# Patient Record
Sex: Female | Born: 1950 | Race: White | Hispanic: No | Marital: Married | Smoking: Never smoker
Health system: Southern US, Community
[De-identification: ages and names within clinical notes are randomized; demographics above are authoritative.]

## PROBLEM LIST (undated history)

## (undated) DIAGNOSIS — M545 Low back pain, unspecified: Secondary | ICD-10-CM

## (undated) DIAGNOSIS — K219 Gastro-esophageal reflux disease without esophagitis: Secondary | ICD-10-CM

## (undated) DIAGNOSIS — R112 Nausea with vomiting, unspecified: Secondary | ICD-10-CM

## (undated) DIAGNOSIS — N952 Postmenopausal atrophic vaginitis: Secondary | ICD-10-CM

## (undated) DIAGNOSIS — K648 Other hemorrhoids: Secondary | ICD-10-CM

## (undated) DIAGNOSIS — M169 Osteoarthritis of hip, unspecified: Secondary | ICD-10-CM

## (undated) DIAGNOSIS — J189 Pneumonia, unspecified organism: Secondary | ICD-10-CM

## (undated) DIAGNOSIS — E669 Obesity, unspecified: Secondary | ICD-10-CM

## (undated) DIAGNOSIS — N390 Urinary tract infection, site not specified: Secondary | ICD-10-CM

## (undated) DIAGNOSIS — M5090 Cervical disc disorder, unspecified, unspecified cervical region: Secondary | ICD-10-CM

## (undated) DIAGNOSIS — R32 Unspecified urinary incontinence: Secondary | ICD-10-CM

## (undated) DIAGNOSIS — R87629 Unspecified abnormal cytological findings in specimens from vagina: Principal | ICD-10-CM

## (undated) DIAGNOSIS — I341 Nonrheumatic mitral (valve) prolapse: Secondary | ICD-10-CM

## (undated) DIAGNOSIS — M1990 Unspecified osteoarthritis, unspecified site: Secondary | ICD-10-CM

## (undated) DIAGNOSIS — G8929 Other chronic pain: Secondary | ICD-10-CM

## (undated) DIAGNOSIS — N3281 Overactive bladder: Secondary | ICD-10-CM

## (undated) DIAGNOSIS — Z1371 Encounter for nonprocreative screening for genetic disease carrier status: Secondary | ICD-10-CM

## (undated) DIAGNOSIS — E785 Hyperlipidemia, unspecified: Principal | ICD-10-CM

## (undated) DIAGNOSIS — C539 Malignant neoplasm of cervix uteri, unspecified: Secondary | ICD-10-CM

## (undated) DIAGNOSIS — G4730 Sleep apnea, unspecified: Secondary | ICD-10-CM

## (undated) DIAGNOSIS — K449 Diaphragmatic hernia without obstruction or gangrene: Secondary | ICD-10-CM

## (undated) DIAGNOSIS — IMO0002 Reserved for concepts with insufficient information to code with codable children: Secondary | ICD-10-CM

## (undated) DIAGNOSIS — K649 Unspecified hemorrhoids: Secondary | ICD-10-CM

## (undated) DIAGNOSIS — Z9889 Other specified postprocedural states: Secondary | ICD-10-CM

## (undated) DIAGNOSIS — N393 Stress incontinence (female) (male): Secondary | ICD-10-CM

## (undated) DIAGNOSIS — K5790 Diverticulosis of intestine, part unspecified, without perforation or abscess without bleeding: Secondary | ICD-10-CM

## (undated) DIAGNOSIS — N318 Other neuromuscular dysfunction of bladder: Secondary | ICD-10-CM

## (undated) DIAGNOSIS — J309 Allergic rhinitis, unspecified: Secondary | ICD-10-CM

## (undated) HISTORY — DX: Osteoarthritis of hip, unspecified: M16.9

## (undated) HISTORY — DX: Cervical disc disorder, unspecified, unspecified cervical region: M50.90

## (undated) HISTORY — DX: Low back pain: M54.5

## (undated) HISTORY — DX: Diaphragmatic hernia without obstruction or gangrene: K44.9

## (undated) HISTORY — DX: Reserved for concepts with insufficient information to code with codable children: IMO0002

## (undated) HISTORY — PX: EYE SURGERY: SHX253

## (undated) HISTORY — PX: CLOSED REDUCTION METATARSAL FRACTURE: SUR228

## (undated) HISTORY — DX: Diverticulosis of intestine, part unspecified, without perforation or abscess without bleeding: K57.90

## (undated) HISTORY — DX: Allergic rhinitis, unspecified: J30.9

## (undated) HISTORY — DX: Hyperlipidemia, unspecified: E78.5

## (undated) HISTORY — DX: Stress incontinence (female) (male): N39.3

## (undated) HISTORY — DX: Gastro-esophageal reflux disease without esophagitis: K21.9

## (undated) HISTORY — DX: Other chronic pain: G89.29

## (undated) HISTORY — DX: Encounter for nonprocreative screening for genetic disease carrier status: Z13.71

## (undated) HISTORY — PX: TONSILLECTOMY: SUR1361

## (undated) HISTORY — DX: Malignant neoplasm of cervix uteri, unspecified: C53.9

## (undated) HISTORY — PX: COLONOSCOPY: SHX174

## (undated) HISTORY — DX: Unspecified osteoarthritis, unspecified site: M19.90

## (undated) HISTORY — DX: Other neuromuscular dysfunction of bladder: N31.8

## (undated) HISTORY — DX: Unspecified abnormal cytological findings in specimens from vagina: R87.629

## (undated) HISTORY — DX: Urinary tract infection, site not specified: N39.0

## (undated) HISTORY — DX: Postmenopausal atrophic vaginitis: N95.2

## (undated) HISTORY — PX: OTHER SURGICAL HISTORY: SHX169

## (undated) HISTORY — DX: Obesity, unspecified: E66.9

## (undated) HISTORY — DX: Overactive bladder: N32.81

## (undated) HISTORY — DX: Other hemorrhoids: K64.8

---

## 1980-11-07 HISTORY — PX: OTHER SURGICAL HISTORY: SHX169

## 1982-11-07 DIAGNOSIS — C539 Malignant neoplasm of cervix uteri, unspecified: Secondary | ICD-10-CM

## 1982-11-07 HISTORY — DX: Malignant neoplasm of cervix uteri, unspecified: C53.9

## 1984-11-07 HISTORY — PX: VAGINAL HYSTERECTOMY: SUR661

## 2000-04-21 ENCOUNTER — Other Ambulatory Visit: Admission: RE | Admit: 2000-04-21 | Discharge: 2000-04-21 | Payer: Self-pay | Admitting: *Deleted

## 2000-09-16 ENCOUNTER — Emergency Department (HOSPITAL_COMMUNITY): Admission: EM | Admit: 2000-09-16 | Discharge: 2000-09-16 | Payer: Self-pay | Admitting: Emergency Medicine

## 2000-11-07 HISTORY — PX: CERVICAL DISCECTOMY: SHX98

## 2000-11-28 ENCOUNTER — Observation Stay (HOSPITAL_COMMUNITY): Admission: RE | Admit: 2000-11-28 | Discharge: 2000-11-29 | Payer: Self-pay | Admitting: Urology

## 2001-02-13 ENCOUNTER — Ambulatory Visit (HOSPITAL_COMMUNITY): Admission: RE | Admit: 2001-02-13 | Discharge: 2001-02-13 | Payer: Self-pay | Admitting: Urology

## 2001-03-28 ENCOUNTER — Other Ambulatory Visit: Admission: RE | Admit: 2001-03-28 | Discharge: 2001-03-28 | Payer: Self-pay | Admitting: *Deleted

## 2001-11-07 HISTORY — PX: CYSTOCELE REPAIR: SHX163

## 2002-05-30 ENCOUNTER — Encounter: Admission: RE | Admit: 2002-05-30 | Discharge: 2002-05-30 | Payer: Self-pay | Admitting: *Deleted

## 2002-07-05 ENCOUNTER — Inpatient Hospital Stay (HOSPITAL_COMMUNITY): Admission: RE | Admit: 2002-07-05 | Discharge: 2002-07-06 | Payer: Self-pay | Admitting: Neurosurgery

## 2002-07-05 ENCOUNTER — Encounter: Payer: Self-pay | Admitting: Neurosurgery

## 2002-09-19 ENCOUNTER — Encounter: Admission: RE | Admit: 2002-09-19 | Discharge: 2002-09-19 | Payer: Self-pay | Admitting: Neurosurgery

## 2002-09-19 ENCOUNTER — Encounter: Payer: Self-pay | Admitting: Neurosurgery

## 2002-10-01 ENCOUNTER — Encounter: Admission: RE | Admit: 2002-10-01 | Discharge: 2002-10-01 | Payer: Self-pay | Admitting: *Deleted

## 2002-10-01 ENCOUNTER — Encounter: Payer: Self-pay | Admitting: Neurosurgery

## 2002-10-15 ENCOUNTER — Encounter: Payer: Self-pay | Admitting: Neurosurgery

## 2002-10-15 ENCOUNTER — Encounter: Admission: RE | Admit: 2002-10-15 | Discharge: 2002-10-15 | Payer: Self-pay | Admitting: Neurosurgery

## 2002-11-07 HISTORY — PX: SHOULDER ARTHROSCOPY: SHX128

## 2003-02-28 ENCOUNTER — Ambulatory Visit (HOSPITAL_BASED_OUTPATIENT_CLINIC_OR_DEPARTMENT_OTHER): Admission: RE | Admit: 2003-02-28 | Discharge: 2003-02-28 | Payer: Self-pay | Admitting: Urology

## 2003-09-09 ENCOUNTER — Other Ambulatory Visit: Admission: RE | Admit: 2003-09-09 | Discharge: 2003-09-09 | Payer: Self-pay | Admitting: Internal Medicine

## 2003-11-14 ENCOUNTER — Encounter: Payer: Self-pay | Admitting: Gastroenterology

## 2004-03-23 ENCOUNTER — Ambulatory Visit (HOSPITAL_BASED_OUTPATIENT_CLINIC_OR_DEPARTMENT_OTHER): Admission: RE | Admit: 2004-03-23 | Discharge: 2004-03-23 | Payer: Self-pay | Admitting: Orthopedic Surgery

## 2004-09-23 ENCOUNTER — Ambulatory Visit: Payer: Self-pay | Admitting: Family Medicine

## 2004-10-05 ENCOUNTER — Ambulatory Visit: Payer: Self-pay | Admitting: Family Medicine

## 2004-11-03 ENCOUNTER — Ambulatory Visit: Payer: Self-pay | Admitting: Family Medicine

## 2005-01-25 ENCOUNTER — Ambulatory Visit: Payer: Self-pay | Admitting: Family Medicine

## 2005-02-15 ENCOUNTER — Encounter: Admission: RE | Admit: 2005-02-15 | Discharge: 2005-02-15 | Payer: Self-pay | Admitting: Neurosurgery

## 2005-03-07 ENCOUNTER — Encounter: Admission: RE | Admit: 2005-03-07 | Discharge: 2005-03-07 | Payer: Self-pay | Admitting: Neurosurgery

## 2005-05-11 ENCOUNTER — Other Ambulatory Visit: Admission: RE | Admit: 2005-05-11 | Discharge: 2005-05-11 | Payer: Self-pay | Admitting: Gynecology

## 2005-05-19 ENCOUNTER — Ambulatory Visit: Payer: Self-pay | Admitting: Family Medicine

## 2005-06-01 ENCOUNTER — Ambulatory Visit (HOSPITAL_COMMUNITY): Admission: RE | Admit: 2005-06-01 | Discharge: 2005-06-02 | Payer: Self-pay | Admitting: Gynecology

## 2005-08-30 ENCOUNTER — Emergency Department (HOSPITAL_COMMUNITY): Admission: EM | Admit: 2005-08-30 | Discharge: 2005-08-30 | Payer: Self-pay | Admitting: Emergency Medicine

## 2005-11-07 HISTORY — PX: BUNIONECTOMY: SHX129

## 2005-12-06 ENCOUNTER — Ambulatory Visit: Payer: Self-pay | Admitting: Family Medicine

## 2005-12-14 ENCOUNTER — Ambulatory Visit: Payer: Self-pay | Admitting: Family Medicine

## 2005-12-22 ENCOUNTER — Ambulatory Visit: Payer: Self-pay | Admitting: Family Medicine

## 2006-03-24 ENCOUNTER — Ambulatory Visit: Payer: Self-pay | Admitting: Family Medicine

## 2006-05-07 ENCOUNTER — Encounter: Payer: Self-pay | Admitting: Family Medicine

## 2006-05-07 LAB — CONVERTED CEMR LAB: Pap Smear: NORMAL

## 2006-05-15 ENCOUNTER — Other Ambulatory Visit: Admission: RE | Admit: 2006-05-15 | Discharge: 2006-05-15 | Payer: Self-pay | Admitting: Gynecology

## 2007-01-03 ENCOUNTER — Ambulatory Visit: Payer: Self-pay | Admitting: Family Medicine

## 2007-01-03 LAB — CONVERTED CEMR LAB
Alkaline Phosphatase: 78 units/L (ref 39–117)
Basophils Relative: 0.1 % (ref 0.0–1.0)
Bilirubin, Direct: 0.1 mg/dL (ref 0.0–0.3)
Chloride: 108 meq/L (ref 96–112)
Eosinophils Absolute: 0.3 10*3/uL (ref 0.0–0.6)
GFR calc non Af Amer: 80 mL/min
Glucose, Bld: 88 mg/dL (ref 70–99)
HDL: 50.5 mg/dL (ref >39.0)
Hemoglobin: 11.9 g/dL — ABNORMAL LOW (ref 12.0–15.0)
LDL Cholesterol: 119 mg/dL — ABNORMAL HIGH (ref 0–99)
Lymphocytes Relative: 41.5 % (ref 12.0–46.0)
Neutro Abs: 3.3 10*3/uL (ref 1.4–7.7)
Neutrophils Relative %: 47.6 % (ref 43.0–77.0)
Potassium: 3.9 meq/L (ref 3.5–5.1)
RBC: 3.99 M/uL (ref 3.87–5.11)
Sodium: 143 meq/L (ref 135–145)
TSH: 2.1 microintl units/mL (ref 0.35–5.50)
Total CHOL/HDL Ratio: 3.7
Triglycerides: 90 mg/dL (ref 0–149)
WBC: 6.8 10*3/uL (ref 4.5–10.5)

## 2007-01-10 ENCOUNTER — Ambulatory Visit: Payer: Self-pay | Admitting: Family Medicine

## 2007-01-25 ENCOUNTER — Ambulatory Visit: Payer: Self-pay

## 2007-04-04 ENCOUNTER — Encounter: Payer: Self-pay | Admitting: Family Medicine

## 2007-05-03 ENCOUNTER — Ambulatory Visit: Payer: Self-pay | Admitting: Family Medicine

## 2007-05-23 ENCOUNTER — Other Ambulatory Visit: Admission: RE | Admit: 2007-05-23 | Discharge: 2007-05-23 | Payer: Self-pay | Admitting: Gynecology

## 2007-08-10 ENCOUNTER — Ambulatory Visit: Payer: Self-pay | Admitting: Family Medicine

## 2007-08-10 DIAGNOSIS — K219 Gastro-esophageal reflux disease without esophagitis: Secondary | ICD-10-CM

## 2007-08-10 DIAGNOSIS — N318 Other neuromuscular dysfunction of bladder: Secondary | ICD-10-CM

## 2007-08-10 HISTORY — DX: Gastro-esophageal reflux disease without esophagitis: K21.9

## 2007-08-10 HISTORY — DX: Other neuromuscular dysfunction of bladder: N31.8

## 2007-10-03 ENCOUNTER — Ambulatory Visit: Payer: Self-pay | Admitting: Family Medicine

## 2007-10-08 ENCOUNTER — Ambulatory Visit: Payer: Self-pay | Admitting: Family Medicine

## 2008-01-24 ENCOUNTER — Encounter: Payer: Self-pay | Admitting: Family Medicine

## 2008-01-30 ENCOUNTER — Ambulatory Visit: Payer: Self-pay | Admitting: Family Medicine

## 2008-01-30 LAB — CONVERTED CEMR LAB
Bilirubin Urine: NEGATIVE
Ketones, urine, test strip: NEGATIVE
Nitrite: POSITIVE
Protein, U semiquant: NEGATIVE
Specific Gravity, Urine: 1.02

## 2008-02-05 ENCOUNTER — Encounter: Payer: Self-pay | Admitting: Family Medicine

## 2008-02-06 ENCOUNTER — Ambulatory Visit: Payer: Self-pay | Admitting: Family Medicine

## 2008-02-06 LAB — CONVERTED CEMR LAB
Albumin: 3.8 g/dL (ref 3.5–5.2)
Basophils Relative: 0.9 % (ref 0.0–1.0)
Cholesterol: 189 mg/dL (ref 0–200)
Eosinophils Absolute: 0.2 10*3/uL (ref 0.0–0.6)
Eosinophils Relative: 2.4 % (ref 0.0–5.0)
GFR calc non Af Amer: 79 mL/min
HCT: 40.5 % (ref 36.0–46.0)
Hemoglobin: 13.1 g/dL (ref 12.0–15.0)
Lymphocytes Relative: 45.3 % (ref 12.0–46.0)
MCHC: 32.5 g/dL (ref 30.0–36.0)
MCV: 88.9 fL (ref 78.0–100.0)
Monocytes Absolute: 0.4 10*3/uL (ref 0.2–0.7)
Neutro Abs: 2.9 10*3/uL (ref 1.4–7.7)
Platelets: 234 10*3/uL (ref 150–400)
TSH: 2.28 microintl units/mL (ref 0.35–5.50)
Total Protein: 6.6 g/dL (ref 6.0–8.3)

## 2008-03-17 ENCOUNTER — Encounter: Payer: Self-pay | Admitting: Family Medicine

## 2008-03-21 ENCOUNTER — Ambulatory Visit: Payer: Self-pay | Admitting: Family Medicine

## 2008-04-11 ENCOUNTER — Encounter: Payer: Self-pay | Admitting: Family Medicine

## 2008-04-28 ENCOUNTER — Encounter: Payer: Self-pay | Admitting: Family Medicine

## 2008-07-29 ENCOUNTER — Ambulatory Visit: Payer: Self-pay | Admitting: Family Medicine

## 2008-07-29 DIAGNOSIS — E669 Obesity, unspecified: Secondary | ICD-10-CM | POA: Insufficient documentation

## 2008-07-31 ENCOUNTER — Ambulatory Visit: Payer: Self-pay | Admitting: Family Medicine

## 2008-08-07 ENCOUNTER — Encounter: Payer: Self-pay | Admitting: Family Medicine

## 2008-11-11 ENCOUNTER — Ambulatory Visit: Payer: Self-pay | Admitting: Family Medicine

## 2008-11-11 DIAGNOSIS — N952 Postmenopausal atrophic vaginitis: Secondary | ICD-10-CM

## 2008-11-11 HISTORY — DX: Postmenopausal atrophic vaginitis: N95.2

## 2008-11-11 LAB — CONVERTED CEMR LAB
Glucose, Urine, Semiquant: NEGATIVE
Nitrite: NEGATIVE
Specific Gravity, Urine: >=1.03
Urobilinogen, UA: 0.2

## 2009-01-14 ENCOUNTER — Encounter: Payer: Self-pay | Admitting: Family Medicine

## 2009-01-30 ENCOUNTER — Ambulatory Visit: Payer: Self-pay | Admitting: Family Medicine

## 2009-01-30 LAB — CONVERTED CEMR LAB
Bilirubin Urine: NEGATIVE
Glucose, Urine, Semiquant: NEGATIVE
Ketones, urine, test strip: NEGATIVE
Specific Gravity, Urine: 1.025
pH: 5.5

## 2009-02-05 LAB — HM MAMMOGRAPHY

## 2009-02-10 ENCOUNTER — Ambulatory Visit: Payer: Self-pay | Admitting: Family Medicine

## 2009-02-10 DIAGNOSIS — M1710 Unilateral primary osteoarthritis, unspecified knee: Secondary | ICD-10-CM

## 2009-02-10 DIAGNOSIS — IMO0002 Reserved for concepts with insufficient information to code with codable children: Secondary | ICD-10-CM

## 2009-02-10 HISTORY — DX: Unilateral primary osteoarthritis, unspecified knee: M17.10

## 2009-02-10 LAB — CONVERTED CEMR LAB
ALT: 17 units/L (ref 0–35)
Albumin: 3.5 g/dL (ref 3.5–5.2)
Alkaline Phosphatase: 75 units/L (ref 39–117)
Bilirubin, Direct: 0.1 mg/dL (ref 0.0–0.3)
Calcium: 9.3 mg/dL (ref 8.4–10.5)
Cholesterol: 175 mg/dL (ref 0–200)
Eosinophils Relative: 2.3 % (ref 0.0–5.0)
HDL: 39.8 mg/dL (ref >39.00)
Hgb A1c MFr Bld: 5.9 % (ref 4.6–6.5)
LDL Cholesterol: 116 mg/dL — ABNORMAL HIGH (ref 0–99)
MCHC: 33.8 g/dL (ref 30.0–36.0)
MCV: 87.5 fL (ref 78.0–100.0)
Monocytes Absolute: 0.4 10*3/uL (ref 0.1–1.0)
Neutrophils Relative %: 47.1 % (ref 43.0–77.0)
Potassium: 3.9 meq/L (ref 3.5–5.1)
RDW: 12.7 % (ref 11.5–14.6)
TSH: 1.36 microintl units/mL (ref 0.35–5.50)
Triglycerides: 95 mg/dL (ref 0.0–149.0)
WBC: 6.3 10*3/uL (ref 4.5–10.5)

## 2009-03-10 ENCOUNTER — Telehealth: Payer: Self-pay | Admitting: Family Medicine

## 2009-03-11 ENCOUNTER — Encounter: Payer: Self-pay | Admitting: Family Medicine

## 2009-03-13 ENCOUNTER — Telehealth: Payer: Self-pay | Admitting: Family Medicine

## 2009-04-13 ENCOUNTER — Encounter: Payer: Self-pay | Admitting: Family Medicine

## 2009-04-13 ENCOUNTER — Telehealth (INDEPENDENT_AMBULATORY_CARE_PROVIDER_SITE_OTHER): Payer: Self-pay | Admitting: *Deleted

## 2009-04-21 ENCOUNTER — Encounter: Payer: Self-pay | Admitting: Family Medicine

## 2009-04-28 ENCOUNTER — Ambulatory Visit: Payer: Self-pay | Admitting: Family Medicine

## 2009-05-29 ENCOUNTER — Encounter: Payer: Self-pay | Admitting: Family Medicine

## 2009-07-09 ENCOUNTER — Ambulatory Visit: Payer: Self-pay | Admitting: Family Medicine

## 2009-07-14 ENCOUNTER — Ambulatory Visit: Payer: Self-pay | Admitting: Family Medicine

## 2009-07-15 ENCOUNTER — Ambulatory Visit: Payer: Self-pay | Admitting: Family Medicine

## 2009-07-16 ENCOUNTER — Ambulatory Visit: Payer: Self-pay | Admitting: Family Medicine

## 2009-07-20 LAB — CONVERTED CEMR LAB
Basophils Relative: 0.2 % (ref 0.0–3.0)
Eosinophils Absolute: 0.3 10*3/uL (ref 0.0–0.7)
Eosinophils Relative: 5.7 % — ABNORMAL HIGH (ref 0.0–5.0)
Lymphocytes Relative: 36.8 % (ref 12.0–46.0)
MCHC: 34.6 g/dL (ref 30.0–36.0)
Neutro Abs: 2.8 10*3/uL (ref 1.4–7.7)
Neutrophils Relative %: 49.9 % (ref 43.0–77.0)
Platelets: 188 10*3/uL (ref 150.0–400.0)
WBC: 5.5 10*3/uL (ref 4.5–10.5)

## 2009-07-22 ENCOUNTER — Ambulatory Visit: Payer: Self-pay | Admitting: Family Medicine

## 2009-07-23 ENCOUNTER — Telehealth: Payer: Self-pay | Admitting: Family Medicine

## 2009-07-29 ENCOUNTER — Telehealth: Payer: Self-pay | Admitting: Family Medicine

## 2009-07-30 ENCOUNTER — Telehealth: Payer: Self-pay | Admitting: Family Medicine

## 2009-07-31 ENCOUNTER — Ambulatory Visit: Payer: Self-pay | Admitting: Family Medicine

## 2009-08-05 ENCOUNTER — Ambulatory Visit: Payer: Self-pay | Admitting: Cardiovascular Disease

## 2009-08-06 ENCOUNTER — Telehealth: Payer: Self-pay | Admitting: Family Medicine

## 2009-08-20 ENCOUNTER — Encounter: Payer: Self-pay | Admitting: Family Medicine

## 2009-08-28 ENCOUNTER — Ambulatory Visit: Payer: Self-pay | Admitting: Family Medicine

## 2009-09-07 ENCOUNTER — Encounter: Payer: Self-pay | Admitting: Family Medicine

## 2009-09-10 ENCOUNTER — Ambulatory Visit: Payer: Self-pay | Admitting: Family Medicine

## 2009-09-10 LAB — CONVERTED CEMR LAB
Blood in Urine, dipstick: NEGATIVE
Ketones, urine, test strip: NEGATIVE
Nitrite: NEGATIVE
Protein, U semiquant: NEGATIVE
Specific Gravity, Urine: 1.02
Urobilinogen, UA: 0.2

## 2009-12-02 ENCOUNTER — Ambulatory Visit: Payer: Self-pay | Admitting: Family Medicine

## 2010-01-02 ENCOUNTER — Ambulatory Visit: Payer: Self-pay | Admitting: Internal Medicine

## 2010-01-02 LAB — CONVERTED CEMR LAB: Rapid Strep: NEGATIVE

## 2010-01-05 ENCOUNTER — Ambulatory Visit: Payer: Self-pay | Admitting: Family Medicine

## 2010-01-11 LAB — CONVERTED CEMR LAB
Basophils Relative: 1 % (ref 0.0–3.0)
Eosinophils Relative: 4 % (ref 0.0–5.0)
Monocytes Relative: 54.1 % — ABNORMAL HIGH (ref 3.0–12.0)
Neutro Abs: 1.5 10*3/uL (ref 1.4–7.7)
Neutrophils Relative %: 12.4 % — ABNORMAL LOW (ref 43.0–77.0)

## 2010-03-11 ENCOUNTER — Ambulatory Visit: Payer: Self-pay | Admitting: Family Medicine

## 2010-03-11 LAB — CONVERTED CEMR LAB
Blood in Urine, dipstick: NEGATIVE
Protein, U semiquant: NEGATIVE
Urobilinogen, UA: 0.2
WBC Urine, dipstick: NEGATIVE
pH: 6

## 2010-03-18 ENCOUNTER — Ambulatory Visit: Payer: Self-pay | Admitting: Family Medicine

## 2010-03-18 LAB — CONVERTED CEMR LAB
Albumin: 3.7 g/dL (ref 3.5–5.2)
Bilirubin, Direct: 0.1 mg/dL (ref 0.0–0.3)
CO2: 31 meq/L (ref 19–32)
Chloride: 104 meq/L (ref 96–112)
Cholesterol: 199 mg/dL (ref 0–200)
Eosinophils Absolute: 0.2 10*3/uL (ref 0.0–0.7)
Eosinophils Relative: 3.4 % (ref 0.0–5.0)
Hemoglobin: 13 g/dL (ref 12.0–15.0)
LDL Cholesterol: 127 mg/dL — ABNORMAL HIGH (ref 0–99)
Lymphs Abs: 2.9 10*3/uL (ref 0.7–4.0)
Monocytes Absolute: 0.4 10*3/uL (ref 0.1–1.0)
Platelets: 244 10*3/uL (ref 150.0–400.0)
Potassium: 3.8 meq/L (ref 3.5–5.1)
RBC: 4.19 M/uL (ref 3.87–5.11)
RDW: 13.2 % (ref 11.5–14.6)
Sodium: 142 meq/L (ref 135–145)
TSH: 2.89 microintl units/mL (ref 0.35–5.50)
Triglycerides: 108 mg/dL (ref 0.0–149.0)
VLDL: 21.6 mg/dL (ref 0.0–40.0)
WBC: 7.1 10*3/uL (ref 4.5–10.5)

## 2010-04-28 ENCOUNTER — Telehealth: Payer: Self-pay | Admitting: Family Medicine

## 2010-04-28 ENCOUNTER — Encounter: Payer: Self-pay | Admitting: Family Medicine

## 2010-05-18 ENCOUNTER — Telehealth: Payer: Self-pay | Admitting: Family Medicine

## 2010-05-24 ENCOUNTER — Encounter: Admission: RE | Admit: 2010-05-24 | Discharge: 2010-05-24 | Payer: Self-pay | Admitting: Neurosurgery

## 2010-06-09 ENCOUNTER — Encounter: Payer: Self-pay | Admitting: Family Medicine

## 2010-07-19 ENCOUNTER — Ambulatory Visit: Payer: Self-pay | Admitting: Family Medicine

## 2010-07-19 DIAGNOSIS — K5900 Constipation, unspecified: Secondary | ICD-10-CM | POA: Insufficient documentation

## 2010-07-20 ENCOUNTER — Encounter: Payer: Self-pay | Admitting: Gastroenterology

## 2010-07-20 ENCOUNTER — Telehealth: Payer: Self-pay | Admitting: Gastroenterology

## 2010-07-21 ENCOUNTER — Encounter: Payer: Self-pay | Admitting: Gastroenterology

## 2010-07-21 ENCOUNTER — Ambulatory Visit: Payer: Self-pay | Admitting: Family Medicine

## 2010-07-21 ENCOUNTER — Ambulatory Visit: Payer: Self-pay | Admitting: Gastroenterology

## 2010-07-21 DIAGNOSIS — R490 Dysphonia: Secondary | ICD-10-CM | POA: Insufficient documentation

## 2010-07-21 DIAGNOSIS — K648 Other hemorrhoids: Secondary | ICD-10-CM

## 2010-07-21 HISTORY — DX: Other hemorrhoids: K64.8

## 2010-07-21 LAB — CONVERTED CEMR LAB
Basophils Relative: 0.5 % (ref 0.0–3.0)
Bilirubin, Direct: 0.1 mg/dL (ref 0.0–0.3)
CO2: 27 meq/L (ref 19–32)
Creatinine, Ser: 0.9 mg/dL (ref 0.4–1.2)
Eosinophils Absolute: 0.2 10*3/uL (ref 0.0–0.7)
Eosinophils Relative: 2.2 % (ref 0.0–5.0)
GFR calc non Af Amer: 66.1 mL/min (ref >60)
HCT: 38.1 % (ref 36.0–46.0)
Hemoglobin: 12.9 g/dL (ref 12.0–15.0)
MCV: 91.8 fL (ref 78.0–100.0)
Monocytes Absolute: 0.4 10*3/uL (ref 0.1–1.0)
Platelets: 223 10*3/uL (ref 150.0–400.0)
RBC: 4.15 M/uL (ref 3.87–5.11)
RDW: 12.6 % (ref 11.5–14.6)
Sodium: 142 meq/L (ref 135–145)
Total Protein: 6.9 g/dL (ref 6.0–8.3)

## 2010-08-04 ENCOUNTER — Ambulatory Visit: Payer: Self-pay | Admitting: Gastroenterology

## 2010-08-16 ENCOUNTER — Ambulatory Visit: Payer: Self-pay | Admitting: Family Medicine

## 2010-08-16 LAB — CONVERTED CEMR LAB
Blood in Urine, dipstick: NEGATIVE
Glucose, Urine, Semiquant: NEGATIVE
Nitrite: NEGATIVE
Protein, U semiquant: NEGATIVE
Specific Gravity, Urine: 1.01
Urobilinogen, UA: 0.2

## 2010-08-17 ENCOUNTER — Encounter: Payer: Self-pay | Admitting: Family Medicine

## 2010-08-31 ENCOUNTER — Ambulatory Visit: Payer: Self-pay | Admitting: Gastroenterology

## 2010-08-31 DIAGNOSIS — K921 Melena: Secondary | ICD-10-CM | POA: Insufficient documentation

## 2010-09-02 ENCOUNTER — Encounter: Payer: Self-pay | Admitting: Family Medicine

## 2010-09-14 ENCOUNTER — Encounter: Payer: Self-pay | Admitting: Gastroenterology

## 2010-09-16 ENCOUNTER — Encounter: Payer: Self-pay | Admitting: Family Medicine

## 2010-10-07 ENCOUNTER — Ambulatory Visit: Payer: Self-pay | Admitting: Gastroenterology

## 2010-10-07 LAB — HM COLONOSCOPY

## 2010-11-28 ENCOUNTER — Encounter: Payer: Self-pay | Admitting: Neurosurgery

## 2010-12-09 NOTE — Assessment & Plan Note (Signed)
Summary: fu on bladder inf/njr   Vital Signs:  Patient profile:   60 year old female Height:      68 inches (172.72 cm) Weight:      232 pounds (105.45 kg) O2 Sat:      98 % on Room air Temp:     97.5 degrees F (36.39 degrees C) oral Pulse rate:   84 / minute BP sitting:   116 / 80  (left arm) Cuff size:   large  Vitals Entered By: Josph Macho RMA (August 16, 2010 2:42 PM)  O2 Flow:  Room air CC: Follow-up visit on bladder infection- still has urgency and little burning/ CF Is Patient Diabetic? No   History of Present Illness: Patient in today for reevaluation of Dysuria. Patient notes it improved slightly with the Macrobid but never fully resolved only to worsen again once the antibiotic stopped. She describes the pain more as on the mucus membrane when the urine hits it not so much during the flow. She denies any vaginal discharge or lesions. No f/c/abd or back pain. No CP/palp/SOB/GI c/o The constipation is greatly improved with the yogurt and fiber her EGD was unremarkable.   Current Medications (verified): 1)  Protonix 40 Mg Tbec (Pantoprazole Sodium) .[name] Kitchen.. 1 By Mouth Two Times A Day 2)  Baby Aspirin 81 Mg  Chew (Aspirin) .[name] Kitchen.. 1 By Mouth Once Daily 3)  Vicodin 5-500 Mg  Tabs (Hydrocodone-Acetaminophen) .[name] Kitchen.. 1 Every 6 Hours As Needed Pain 4)  Vitamin D 1000 Unit  Caps (Cholecalciferol) .... 3 Once Daily 5)  Vivelle-Dot 0.1 Mg/24hr  Pttw (Estradiol) .... 2x Week 6)  Tesstosterone Cream 4% .... Apply Pea Size At Bedtime As Directed 7)  Nabumetone 750 Mg Tabs (Nabumetone) .[name] Kitchen.. 1 Two Times A Day Pc For Arthritis 8)  Toviaz 8 Mg Xr24h-Tab (Fesoterodine Fumarate) .[name] Kitchen.. 1 By Mouth Once Daily 9)  Hydrocodone-Homatropine 5-1.5 Mg/66ml Syrp (Hydrocodone-Homatropine) .[name] Kitchen.. 1-2 Tsp Every  4hrs As Needed For Cough 10)  Tramadol Hcl 50 Mg Tabs (Tramadol Hcl) .[name] Kitchen.. 1 Qid To Prevent Cough 11)  Phenteramine 37.5 .[name] Kitchen.. 1 Qam To Decrease Appetitie 12)  Align 4 Mg Caps (Probiotic Product) .[name] Kitchen.. 1  Cap By Mouth Daily 13)  Benefiber  Powd (Wheat Dextrin) .... 2 Tsp By Mouth Two Times A Day in 8 Oz of Fluids or Food 14)  Miralax  Powd (Polyethylene Glycol 3350) .... 17mg  in 8 Oz Fluids By Mouth Daily As Needed Constipation. 15)  Hydrocortisone Acetate 25 Mg Supp (Hydrocortisone Acetate) .... Use 1 Suppository Rectally At Bedtime  Allergies (verified): 1)  ! Penicillin 2)  Amoxicillin (Amoxicillin)  Past History:  Past medical history reviewed for relevance to current acute and chronic problems. Social history (including risk factors) reviewed for relevance to current acute and chronic problems.  Past Medical History: Reviewed history from 07/21/2010 and no changes required. GERD Rheumatoid arthritis Low back pain Pneumonia Urinary Tract Infection  Social History: Reviewed history from 07/21/2010 and no changes required. non smoker  Occupation: Psychologist, educational Asst. Alcohol Use - yes Daily Caffeine Use Illicit Drug Use - no  Review of Systems       Flu Vaccine Consent Questions     Do you have a history of severe allergic reactions to this vaccine? no    Any prior history of allergic reactions to egg and/or gelatin? no    Do you have a sensitivity to the preservative Thimersol? no    Do you have a past history of Guillan-Barre Syndrome? no  Do you currently have an acute febrile illness? no    Have you ever had a severe reaction to latex? no    Vaccine information given and explained to patient? yes    Are you currently pregnant? no    Lot Number:[id number]   Exp Date:05/07/2011   Site Given  Left Deltoid IM Josph Macho RMA  August 16, 2010 2:47 PM   Physical Exam  General:  Well-developed,well-nourished,in no acute distress; alert,appropriate and cooperative throughout examination Head:  Normocephalic and atraumatic without obvious abnormalities. No apparent alopecia or balding. Mouth:  Oral mucosa and oropharynx without lesions or exudates.  Teeth in good  repair. Neck:  No deformities, masses, or tenderness noted. Lungs:  Normal respiratory effort, chest expands symmetrically. Lungs are clear to auscultation, no crackles or wheezes. Heart:  Normal rate and regular rhythm. S1 and S2 normal without gallop, murmur, click, rub or other extra sounds. Abdomen:  Bowel sounds positive,abdomen soft and non-tender without masses, organomegaly or hernias noted. Extremities:  No clubbing, cyanosis, edema, or deformity noted with normal full range of motion of all joints.   Psych:  Cognition and judgment appear intact. Alert and cooperative with normal attention span and concentration. No apparent delusions, illusions, hallucinations   Impression & Recommendations:  Problem # 1:  UTI (ICD-599.0)  The following medications were removed from the medication list:    Toviaz 8 Mg Xr24h-tab (Fesoterodine fumarate) .[name] Kitchen... 1 by mouth once daily    Macrobid 100 Mg Caps (Nitrofurantoin monohyd macro) ..... One capsule two times a day x5 days Her updated medication list for this problem includes:    Enablex 15 Mg Xr24h-tab (Darifenacin hydrobromide) .[name] Kitchen... 1 tab by mouth once daily    Cipro 250 Mg Tabs (Ciprofloxacin hcl) .[name] Kitchen... 1 tab by mouth two times a day x 5days  Orders: UA Dipstick w/o Micro (automated)  ([address]) Urology Referral (Urology) T-Urine Culture (Spectrum Order) [id number]) Push clear fluids and encouraged to make an appt with her Gynecologist to evaluate for vaginitis and for routine care Start a probiotic  Problem # 2:  CONSTIPATION (ICD-564.00)  Her updated medication list for this problem includes:    Benefiber Powd (Wheat dextrin) .[name] Kitchen... 2 tsp by mouth two times a day in 8 oz of fluids or food    Miralax Powd (Polyethylene glycol 3350) ..... 17mg  in 8 oz fluids by mouth daily as needed constipation. Improving with yogurt and Benefiber, increase clear fluids  Complete Medication List: 1)  Protonix 40 Mg Tbec (Pantoprazole sodium) .[name] Kitchen.. 1 by  mouth two times a day 2)  Baby Aspirin 81 Mg Chew (Aspirin) .[name] Kitchen.. 1 by mouth once daily 3)  Vicodin 5-500 Mg Tabs (Hydrocodone-acetaminophen) .[name] Kitchen.. 1 every 6 hours as needed pain 4)  Vitamin D 1000 Unit Caps (Cholecalciferol) .... 3 once daily 5)  Vivelle-dot 0.1 Mg/24hr Pttw (Estradiol) .... 2x week 6)  Tesstosterone Cream 4%  .... Apply pea size at bedtime as directed 7)  Nabumetone 750 Mg Tabs (Nabumetone) .[name] Kitchen.. 1 two times a day pc for arthritis 8)  Hydrocodone-homatropine 5-1.5 Mg/53ml Syrp (Hydrocodone-homatropine) .[name] Kitchen.. 1-2 tsp every  4hrs as needed for cough 9)  Tramadol Hcl 50 Mg Tabs (Tramadol hcl) .[name] Kitchen.. 1 qid to prevent cough 10)  Phenteramine 37.5  .[name] Kitchen.. 1 qam to decrease appetitie 11)  Align 4 Mg Caps (Probiotic product) .[name] Kitchen.. 1 cap by mouth daily 12)  Benefiber Powd (Wheat dextrin) .... 2 tsp by mouth two times a day in 8 oz of fluids or food  13)  Miralax Powd (Polyethylene glycol 3350) .... 17mg  in 8 oz fluids by mouth daily as needed constipation. 14)  Hydrocortisone Acetate 25 Mg Supp (Hydrocortisone acetate) .... Use 1 suppository rectally at bedtime 15)  Enablex 15 Mg Xr24h-tab (Darifenacin hydrobromide) .[name] Kitchen.. 1 tab by mouth once daily 16)  Cipro 250 Mg Tabs (Ciprofloxacin hcl) .[name] Kitchen.. 1 tab by mouth two times a day x 5days 17)  Fluconazole 100 Mg Tabs (Fluconazole) .[name] Kitchen.. 1 tab by mouth q week x 2 weeks 18)  Hyoscyamine Sulfate 0.125 Mg Tabs (Hyoscyamine sulfate) .[name] Kitchen.. 1 tab sl as needed abdominal pain/hiccups q 4 hours as needed  Other Orders: Admin 1st Vaccine ([address]) Flu Vaccine 31yrs + ([address])  Patient Instructions: 1)  Please schedule a follow-up appointment as needed if symptoms worsen or do not improve 2)  Drink plenty of fluids up to 3-4 quarts a day. Cranberry juice is especially recommended in addition to large amounts of water. Avoid caffeine & carbonated drinks, they tend to irritate the bladder, Return in 3-5 days if you're not better: sooner if you're feeling worse.  3)   Take your antibiotic as prescribed until ALL of it is gone, but stop if you develop a rash or swelling and contact our office as soon as possible.  Prescriptions: HYOSCYAMINE SULFATE 0.125 MG TABS (HYOSCYAMINE SULFATE) 1 tab sl as needed abdominal pain/hiccups q 4 hours as needed  #30 x 1   Entered and Authorized by:   Danise Edge MD   Signed by:   Danise Edge MD on 08/16/2010   Method used:   Electronically to        [facility] # [phone]* (retail)       [address]       [place], [place]  [address]       Ph: [phone]       Fax: [phone]   RxID:   [id number] FLUCONAZOLE 100 MG TABS (FLUCONAZOLE) 1 tab by mouth q week x 2 weeks  #2 x 0   Entered and Authorized by:   Danise Edge MD   Signed by:   Danise Edge MD on 08/16/2010   Method used:   Electronically to        [facility] # [phone]* (retail)       [address]       [place], [place]  [address]       Ph: [phone]       Fax: [phone]   RxID:   [id number] CIPRO 250 MG TABS (CIPROFLOXACIN HCL) 1 tab by mouth two times a day x 5days  #10 x 0   Entered and Authorized by:   Danise Edge MD   Signed by:   Danise Edge MD on 08/16/2010   Method used:   Electronically to        [facility] # [phone]* (retail)       [address]       [place], [place]  [address]       Ph: [phone]       Fax: [phone]   RxID:   [id number] ENABLEX 15 MG XR24H-TAB (DARIFENACIN HYDROBROMIDE) 1 tab by mouth once daily  #30 x 1   Entered and Authorized by:   Danise Edge MD   Signed by:   Danise Edge MD on 08/16/2010   Method used:   Electronically to        [facility] # [id number]* (retail)  [address] [place], [place]  [address]       Ph: [phone]       Fax: [phone]   RxID:   [id number]   Laboratory Results   Urine Tests    Routine Urinalysis   Color: yellow Appearance: Clear Glucose: negative   (Normal Range:  Negative) Bilirubin: small   (Normal Range: Negative) Ketone: trace (5)   (Normal Range: Negative) Spec. Gravity: 1.010   (Normal Range: 1.003-1.035) Blood: negative   (Normal Range: Negative) pH: 5.0   (Normal Range: 5.0-8.0) Protein: negative   (Normal Range: Negative) Urobilinogen: 0.2   (Normal Range: 0-1) Nitrite: negative   (Normal Range: Negative) Leukocyte Esterace: small   (Normal Range: Negative)

## 2010-12-09 NOTE — Letter (Signed)
Summary: [facility]  [facility]   Imported By: Maryln Gottron 04/22/2010 15:32:07  _____________________________________________________________________  External Attachment:    Type:   Image     Comment:   External Document

## 2010-12-09 NOTE — Miscellaneous (Signed)
Summary: Controlled Substances Contract  Controlled Substances Contract   Imported By: Maryln Gottron 07/22/2010 10:10:05  _____________________________________________________________________  External Attachment:    Type:   Image     Comment:   External Document

## 2010-12-09 NOTE — Letter (Signed)
Summary: New Patient letter  [facility]  [address] [place], [place] [address]   Phone: [phone]  Fax: [phone]       07/20/2010 MRN: [id number]  [facility] [address] [place], [place]  [address]  Dear [name],  Welcome to the Gastroenterology Division at [facility].    You are scheduled to see Dr.  Claudette Head on Sep 23, 2010 at 2:30pm on the 3rd floor at [facility], [address]  We ask that you try to arrive at our office 15 minutes prior to your appointment time to allow for check-in.  We would like you to complete the enclosed self-administered evaluation form prior to your visit and bring it with you on the day of your appointment.  We will review it with you.  Also, please bring a complete list of all your medications or, if you prefer, bring the medication bottles and we will list them.  Please bring your insurance card so that we may make a copy of it.  If your insurance requires a referral to see a specialist, please bring your referral form from your primary care physician.  Co-payments are due at the time of your visit and may be paid by cash, check or credit card.     Your office visit will consist of a consult with your physician (includes a physical exam), any laboratory testing he/she may order, scheduling of any necessary diagnostic testing (e.g. x-ray, ultrasound, CT-scan), and scheduling of a procedure (e.g. Endoscopy, Colonoscopy) if required.  Please allow enough time on your schedule to allow for any/all of these possibilities.    If you cannot keep your appointment, please call [phone] to cancel or reschedule prior to your appointment date.  This allows [place] the opportunity to schedule an appointment for another patient in need of care.  If you do not cancel or reschedule by 5 p.m. the business day prior to your appointment date, you will be charged a $50.00 late cancellation/no-show fee.    Thank you for  choosing [facility] for your medical needs.  We appreciate the opportunity to care for you.  Please visit [place] at our website  to learn more about our practice.                     Sincerely,                                                             The Gastroenterology Division

## 2010-12-09 NOTE — Assessment & Plan Note (Signed)
Summary: CONSTIPATION,RECTAL BLEED, GERD, ABD PAIN./YF    History of Present Illness Visit Type: Initial Consult Primary GI MD: Elie Goody MD [facility] Primary Provider: Dianna Limbo, MD Requesting Provider: Danise Edge, MD Chief Complaint: GERD/medication, constipation and BRB on tissue and in toilet, patient has hoarseness History of Present Illness:   [name] is a 60 year old female with chronic constipation and GERD. Patient feels her constipation is secondary to pain medications and medication for her overactive bladder. She was seen by PCP two days ago for above problems and was started on fiber, probiotic and Miralax.  Labs were all unremarkable, abdominal series remarkable for large stool burden. Patient's main concerns today are rectal bleeding, relatively new onset of periumbilical pain, hoarse voice and hiccups. She takes twice daily PPI. No history of heartburn, regurgitation. No dysphagia.   Reports decreased appetite but on Phenteramine.   GI Review of Systems    Reports abdominal pain, bloating, and  loss of appetite.     Location of  Abdominal pain: left side.    Denies acid reflux, belching, chest pain, dysphagia with liquids, dysphagia with solids, heartburn, nausea, vomiting, vomiting blood, weight loss, and  weight gain.      Reports constipation, hemorrhoids, rectal bleeding, and  rectal pain.     Denies anal fissure, black tarry stools, change in bowel habit, diarrhea, diverticulosis, fecal incontinence, heme positive stool, irritable bowel syndrome, jaundice, light color stool, and  liver problems. Preventive Screening-Counseling & Management      Drug Use:  no.      Current Medications (verified): 1)  Protonix 40 Mg Tbec (Pantoprazole Sodium) .[name] Kitchen.. 1 By Mouth Two Times A Day 2)  Baby Aspirin 81 Mg  Chew (Aspirin) .[name] Kitchen.. 1 By Mouth Once Daily 3)  Vicodin 5-500 Mg  Tabs (Hydrocodone-Acetaminophen) .[name] Kitchen.. 1 Every 6 Hours As Needed Pain 4)  Vitamin D 1000 Unit   Caps (Cholecalciferol) .... 3 Once Daily 5)  Vivelle-Dot 0.1 Mg/24hr  Pttw (Estradiol) .... 2x Week 6)  Tesstosterone Cream 4% .... Apply Pea Size At Bedtime As Directed 7)  Nabumetone 750 Mg Tabs (Nabumetone) .[name] Kitchen.. 1 Two Times A Day Pc For Arthritis 8)  Toviaz 8 Mg Xr24h-Tab (Fesoterodine Fumarate) .[name] Kitchen.. 1 By Mouth Once Daily 9)  Hydrocodone-Homatropine 5-1.5 Mg/55ml Syrp (Hydrocodone-Homatropine) .[name] Kitchen.. 1-2 Tsp Every  4hrs As Needed For Cough 10)  Tramadol Hcl 50 Mg Tabs (Tramadol Hcl) .[name] Kitchen.. 1 Qid To Prevent Cough 11)  Phenteramine 37.5 .[name] Kitchen.. 1 Qam To Decrease Appetitie 12)  Align 4 Mg Caps (Probiotic Product) .[name] Kitchen.. 1 Cap By Mouth Daily 13)  Benefiber  Powd (Wheat Dextrin) .... 2 Tsp By Mouth Two Times A Day in 8 Oz of Fluids or Food 14)  Miralax  Powd (Polyethylene Glycol 3350) .... 17mg  in 8 Oz Fluids By Mouth Daily As Needed Constipation. 15)  Macrobid 100 Mg Caps (Nitrofurantoin Monohyd Macro) .... One Capsule Two Times A Day X5 Days  Allergies: 1)  ! Penicillin 2)  Amoxicillin (Amoxicillin)  Past History:  Past Medical History: GERD Rheumatoid arthritis Low back pain Pneumonia Urinary Tract Infection  Past Surgical History: Reviewed history from 12/02/2009 and no changes required. neck surgery hysterectomy, rectocoele cyctocoele  Family History: Family History of Breast Cancer:Mother & Sister Family History of Uterine Cancer:GM Family History of Diabetes: Brother, Maternal Aunts & Uncles Family History of Heart Disease: Brother x2  Social History: non smoker  Occupation: Psychologist, educational Asst. Alcohol Use - yes Daily Caffeine Use Illicit Drug Use -  no Drug Use:  no  Review of Systems       The patient complains of arthritis/joint pain, fatigue, itching, muscle pains/cramps, sleeping problems, swelling of feet/legs, urination - excessive, urine leakage, and voice change.  The patient denies allergy/sinus, anemia, anxiety-new, back pain, blood in urine, breast changes/lumps,  change in vision, confusion, cough, coughing up blood, depression-new, fainting, fever, headaches-new, hearing problems, heart murmur, heart rhythm changes, menstrual pain, night sweats, nosebleeds, pregnancy symptoms, shortness of breath, skin rash, sore throat, swollen lymph glands, thirst - excessive , urination - excessive , urination changes/pain, and vision changes.    Vital Signs:  Patient profile:   60 year old female Height:      68 inches Weight:      233.13 pounds BMI:     35.58 Pulse rate:   60 / minute Pulse rhythm:   regular BP sitting:   128 / 80  (left arm) Cuff size:   regular  Vitals Entered By: June McMurray CMA Duncan Dull) (July 21, 2010 9:53 AM)  Physical Exam  General:  Pleasant, obese white female with hoarse voice Head:  Normocephalic and atraumatic. Eyes:  Conjunctiva pink, no icterus.  Mouth:  No oral lesions. Tongue moist.  Neck:  no obvious masses  Lungs:  Clear throughout to auscultation. Heart:  Regular rate and rhythm; soft murmur Abdomen:  Abdomen soft, nontender, nondistended. No obvious masses or hepatomegaly.Normal bowel sounds.  Rectal:  Hemorrhoid tags. No internal lesions felt. Mildly inflamed internal hemorrhoids on ansocopy.  Msk:  Symmetrical with no gross deformities. Normal posture. Extremities:  No palmar erythema, no edema.  Neurologic:  Alert and  oriented x4;  grossly normal neurologically. Skin:  Intact without significant lesions or rashes. Cervical Nodes:  No significant cervical adenopathy. Psych:  Alert and cooperative. Normal mood and affect.   Impression & Recommendations:  Problem # 1:  HOARSENESS (ZOX-096.04) Assessment New Associated with hiccups. She has history of GERD, takes twice daily PPI. Her symptoms may be extraintestinal manifestations of ongoing reflux. For further evaluation the patient will be scheduled for an EGD with biopsies ( if indicated).  The risks and benefits of the procedure, as well as  alternatives were discussed with the patient and she agrees to proceed. Patient takes Phenteramine for weight loss and she takes one Hydrocodone at night. Hopefully these willl not affect her ability to be sedated.  Problem # 2:  HEMORRHOIDS-INTERNAL (ICD-455.0) Assessment: Deteriorated Mildly inflamed internal hemorrhoids on exam,  She had screening colonoscopy Jan. 2005 to cecum with good prep normal except for internal hemorrhoids. Start steroid suppositories. Treat constipation.   Problem # 3:  RECTAL BLEEDING (ICD-569.3) Likely secondary to hemorrhoids seen on examination. Will treat constipation and hemorrhoids. Follow up with Dr. Russella Dar after EGD and depending on response to treatment patient may need colonoscopy.  Problem # 4:  PERIUMBILICAL PAIN (ICD-789.05) Assessment: New Pain is intermittent, started within last few days. No radiation to RLQ. Etiology not clear. Abdominal series does show large stool burden so pain may be secondary to constipation.   Other Orders: EGD (EGD)  Patient Instructions: 1)  Take Miralax, 17 grams in 8 oz of water or clear juice. 2)  We [facility] supp[facility] [address] [address] [place]. 3)  We have given you anti-reflux information brochures. 4)  We scheduled the Endoscopy with Dr Russella Dar , 4th floor of our Endoscopy Unit , [address], our building. 5)  Directions and brochure provided. 6)  [facility]  Patient Information Guide given to patient.[name] Kitchen 7)  We scheduled the follow up office visit with Dr Russella Dar for 08-31-10 at [facility].  8)  Copy sent to : Gareth Morgan, MD 9)                         Danise Edge, MD Prescriptions: HYDROCORTISONE ACETATE 25 MG SUPP (HYDROCORTISONE ACETATE) Use 1 suppository rectally at bedtime  #10 x 1   Entered by:   Lowry Ram NCMA   Authorized by:   Willette Cluster NP   Signed by:   Lowry Ram NCMA on 07/21/2010   Method used:   Electronically to        [facility]  # [id number]* (retail)       [address]       [place], [place]  [address]       Ph: [phone]       Fax: [phone]   RxID:   [id number]

## 2010-12-09 NOTE — Consult Note (Signed)
Summary: [facility]  [facility]   Imported By: Lester [place] 09/24/2010 08:48:11  _____________________________________________________________________  External Attachment:    Type:   Image     Comment:   External Document

## 2010-12-09 NOTE — Letter (Signed)
Summary: [facility]  [facility]   Imported By: Maryln Gottron 11/13/2009 15:32:01  _____________________________________________________________________  External Attachment:    Type:   Image     Comment:   External Document

## 2010-12-09 NOTE — Letter (Signed)
Summary: [facility]  [facility]   Imported By: Maryln Gottron 06/24/2010 11:29:26  _____________________________________________________________________  External Attachment:    Type:   Image     Comment:   External Document

## 2010-12-09 NOTE — Procedures (Addendum)
Summary: Colonoscopy  Patient: [name] Note: All result statuses are Final unless otherwise noted.  Tests: (1) Colonoscopy (COL)   COL Colonoscopy           DONE (C)     [facility]     [address]     [place], [place]  [address]           COLONOSCOPY PROCEDURE REPORT     PATIENT:  [name], [name]  MR#:  [id number]     BIRTHDATE:  1951/09/11, 58 yrs. old  GENDER:  female     ENDOSCOPIST:  Judie Petit T. Russella Dar, MD, [facility]           PROCEDURE DATE:  10/07/2010     PROCEDURE:  Colon w/ inj sclerosis of hemorrhoids     ASA CLASS:  Class II     INDICATIONS:  1) hematochezia     MEDICATIONS:   Fentanyl 100 mcg IV, Versed 12 mg IV     DESCRIPTION OF PROCEDURE:   After the risks benefits and     alternatives of the procedure were thoroughly explained, informed     consent was obtained.  Digital rectal exam was performed and     revealed no abnormalities.   The LB [id number] endoscope     was introduced through the anus and advanced to the cecum, which     was identified by both the appendix and ileocecal valve, limited     by a tortuous and redundant colon. The quality of the prep was     excellent, using MoviPrep.  The instrument was then slowly     withdrawn as the colon was fully examined.     <<PROCEDUREIMAGES>>     FINDINGS:  Mild diverticulosis was found in the sigmoid colon.  A     normal appearing cecum, ileocecal valve, and appendiceal orifice     were identified. The ascending, hepatic flexure, transverse,     splenic flexure, descending colon, and rectum appeared     unremarkable.  Internal hemorrhoids were found. They were     moderately sized. Injection sclerosis of the internal hemorrhoids     well above the dentate line with 2 cc of 23.4% saline.     Retroflexed views in the rectum revealed no other findings other     than those already described.  The time to cecum =  8  minutes.     The scope was then withdrawn (time =  13  min) from the patient     and the  procedure completed.           COMPLICATIONS:  None           ENDOSCOPIC IMPRESSION:     1) Mild diverticulosis in the sigmoid colon     2) Internal hemorrhoids           RECOMMENDATIONS:     1) High fiber diet with liberal fluid intake.     2) Continue current colorectal screening recommendations for     "routine risk" patients with a repeat colonoscopy in 10 years.           Venita Lick. Russella Dar, MD, [facility]           CC: Reuel Derby, MD           n.     REVISED:  10/18/2010 11:50 AM     eSIGNED:   Judie Petit T. Stark at 10/18/2010 11:50 AM  [name], [name], [id number]  Note: An exclamation mark (!) indicates a result that was not dispersed into the flowsheet. Document Creation Date: 10/18/2010 11:51 AM _______________________________________________________________________  (1) Order result status: Final Collection or observation date-time: 10/07/2010 10:26 Requested date-time:  Receipt date-time:  Reported date-time:  Referring Physician:   Ordering Physician: Claudette Head [id number]) Specimen Source:  Source: Launa Grill Order Number: [id number] Lab site:

## 2010-12-09 NOTE — Assessment & Plan Note (Signed)
Summary: F/U EGD, GERD, saw Willette Cluster RNP   History of Present Illness Visit Type: Follow-up Visit Primary GI MD: Elie Goody MD [facility] Primary Abner Ardis: Dianna Limbo, MD Requesting Jerrianne Hartin: Danise Edge, MD Chief Complaint: follow-up EGD for GERD c/o BRB on tissue after bowel movement History of Present Illness:   This is a return office visit for hoarseness, hiccups and voice fatigue. She states while she was on vacation last week in [facility] she did not have any of these symptoms. Her hoarseness and voice fatigue occur at random times throughout the day. She occasionally notes hiccups following meals. Her recent upper endoscopy was normal. She has persistent problems a small volume hematochezia occurring once or twice a week despite using hydrocortisone suppositories. She denies any constipation or diarrhea at this time. She is taking fiber supplement on a regular basis.   GI Review of Systems    Reports acid reflux.      Denies abdominal pain, belching, bloating, chest pain, dysphagia with liquids, dysphagia with solids, heartburn, loss of appetite, nausea, vomiting, vomiting blood, weight loss, and  weight gain.      Reports hemorrhoids and  rectal bleeding.     Denies anal fissure, black tarry stools, change in bowel habit, constipation, diarrhea, diverticulosis, fecal incontinence, heme positive stool, irritable bowel syndrome, jaundice, light color stool, liver problems, and  rectal pain.   Current Medications (verified): 1)  Protonix 40 Mg Tbec (Pantoprazole Sodium) .[name] Kitchen.. 1 By Mouth Two Times A Day 2)  Baby Aspirin 81 Mg  Chew (Aspirin) .[name] Kitchen.. 1 By Mouth Once Daily 3)  Vicodin 5-500 Mg  Tabs (Hydrocodone-Acetaminophen) .[name] Kitchen.. 1 Every 6 Hours As Needed Pain 4)  Vitamin D 1000 Unit  Caps (Cholecalciferol) .... 3 Once Daily 5)  Vivelle-Dot 0.1 Mg/24hr  Pttw (Estradiol) .... 2x Week 6)  Tesstosterone Cream 4% .... Apply Pea Size At Bedtime As Directed 7)  Nabumetone 750 Mg  Tabs (Nabumetone) .[name] Kitchen.. 1 Two Times A Day Pc For Arthritis 8)  Hydrocodone-Homatropine 5-1.5 Mg/69ml Syrp (Hydrocodone-Homatropine) .[name] Kitchen.. 1-2 Tsp Every  4hrs As Needed For Cough 9)  Tramadol Hcl 50 Mg Tabs (Tramadol Hcl) .[name] Kitchen.. 1 Qid To Prevent Cough 10)  Phenteramine 37.5 .[name] Kitchen.. 1 Qam To Decrease Appetitie 11)  Align 4 Mg Caps (Probiotic Product) .[name] Kitchen.. 1 Cap By Mouth Daily 12)  Benefiber  Powd (Wheat Dextrin) .... 2 Tsp By Mouth Two Times A Day in 8 Oz of Fluids or Food 13)  Miralax  Powd (Polyethylene Glycol 3350) .... 17mg  in 8 Oz Fluids By Mouth Daily As Needed Constipation. 14)  Hydrocortisone Acetate 25 Mg Supp (Hydrocortisone Acetate) .... Use 1 Suppository Rectally At Bedtime 15)  Enablex 15 Mg Xr24h-Tab (Darifenacin Hydrobromide) .[name] Kitchen.. 1 Tab By Mouth Once Daily 16)  Hyoscyamine Sulfate 0.125 Mg Tabs (Hyoscyamine Sulfate) .[name] Kitchen.. 1 Tab Sl As Needed Abdominal Pain/hiccups Q 4 Hours As Needed  Allergies (verified): 1)  ! Penicillin 2)  Amoxicillin (Amoxicillin)  Past History:  Past Medical History: GERD Rheumatoid arthritis Low back pain Pneumonia Urinary Tract Infection Internal hemorrhiods  Past Surgical History: Reviewed history from 12/02/2009 and no changes required. neck surgery hysterectomy, rectocoele cyctocoele  Family History: Reviewed history from 07/21/2010 and no changes required. Family History of Breast Cancer:Mother & Sister Family History of Uterine Cancer:GM Family History of Diabetes: Brother, Maternal Aunts & Uncles Family History of Heart Disease: Brother x2 No FH of Colon Cancer:  Social History: Reviewed history from 07/21/2010 and no changes required. non smoker  Occupation: Executive Asst. Alcohol Use - yes Daily Caffeine Use Illicit Drug Use - no Married  1 girl  Review of Systems       The patient complains of fatigue and urination changes/pain.         The pertinent positives and negatives are noted as above and in the HPI. All other ROS were  reviewed and were negative.   Vital Signs:  Patient profile:   60 year old female Height:      68 inches Weight:      234 pounds BMI:     35.71 Pulse rate:   84 / minute Pulse rhythm:   regular BP sitting:   108 / 78  (left arm)  Vitals Entered By: Milford Cage NCMA (August 31, 2010 3:05 PM)  Physical Exam  General:  Well developed, well nourished, no acute distress. obese.   Head:  Normocephalic and atraumatic. Eyes:  PERRLA, no icterus. Ears:  Normal auditory acuity. Mouth:  No deformity or lesions, dentition normal. Heart:  Regular rate and rhythm; no murmurs, rubs,  or bruits. Abdomen:  Soft, nontender and nondistended. No masses, hepatosplenomegaly or hernias noted. Normal bowel sounds. Neurologic:  Alert and  oriented x4;  grossly normal neurologically. Psych:  Alert and cooperative. Normal mood and affect.  Impression & Recommendations:  Problem # 1:  HOARSENESS (ICD-784.42) Hoarseness and voice fatigue. Possibly related to LPR however, other etiologies need to be investigated. ENT referral. Orders: [facility]  Problem # 2:  GERD (ICD-530.81) Recent EGD negative. Discontinue Protonix and begin Dexilant 60 mg q.a.m.  Discontinue the use of Levsin for hiccups.  Problem # 3:  BLOOD IN STOOL (ICD-578.1) Persistent small-volume hematochezia despite treatment for hemorrhoids. Although hemorrhoidal bleeding is a likely possibility need to exclude colorectal neoplasms and other disorders. The risks, benefits and alternatives to colonoscopy with possible biopsy, possible destruction of hemorrhoids and possible polypectomy were discussed with the patient and they consent to proceed. The procedure will be scheduled electively. Orders: Colonoscopy (Colon)  Patient Instructions: 1)  Samples of Dexilant given to take in place of Protonix one tablet by mouth once daily x 2 weeks.  2)  Colonoscopy brochure given.  3)  We have scheduled you to see  Dr. Jearld Fenton at [facility] on 09/14/10 at 1:00pm. Please bring medications and copay with you to the appointment.  4)  Copy sent to : Danise Edge, MD 5)  The medication list was reviewed and reconciled.  All changed / newly prescribed medications were explained.  A complete medication list was provided to the patient / caregiver.  Prescriptions: MOVIPREP 100 GM  SOLR (PEG-KCL-NACL-NASULF-NA ASC-C) As per prep instructions.  #1 x 0   Entered by:   Christie Nottingham CMA (AAMA)   Authorized by:   Meryl Dare MD [facility]   Signed by:   Christie Nottingham CMA (AAMA) on 08/31/2010   Method used:   Electronically to        [id number]* (retail)       [address]       [place], [place]  [address]       Ph: [phone]       Fax: [phone]   RxID:   [id number]   Appended Document: F/U EGD, GERD, saw Willette Cluster RNP Informed pt that if she is taking the Levsin for hiccups, she is to stop this medication b/c pt was told that this excerbate the reflux problems.  Pt states she has not started this medication yet and will not start the medication per Dr. Ardell Isaacs request.   Clinical Lists Changes  Medications: Removed medication of HYOSCYAMINE SULFATE 0.125 MG TABS (HYOSCYAMINE SULFATE) 1 tab sl as needed abdominal pain/hiccups q 4 hours as needed

## 2010-12-09 NOTE — Assessment & Plan Note (Signed)
Summary: SWOLLEN GLAND/PN   Vital Signs:  Patient profile:   60 year old female Weight:      227 pounds BMI:     34.64 O2 Sat:      97 % Temp:     97.8 degrees F Pulse rate:   97 / minute BP sitting:   108 / 70  Vitals Entered By: Lamar Sprinkles, CMA (January 02, 2010 1:36 PM) CC: Pt c/o h/a, sore throat, bodyaches since thursday pm. Dx w/pneumonia in august, has had cough since. It is productive with clear mucus. Would like refill of hydrocodone cough syrup.    History of Present Illness: [name] comes in as walk in  to [facility] clinic  with ac iness and scratchy throat and neck hurts for 1-2 days .   Boss  had strep throat. but was on rx .  has  some congestion  and sore neck some HA .   No fever.  Also Cough off an on for months   No wheezing sob No NVD.  mucous is clear.  no hx of asthma and non smoker.   Had pneumonia over 3 months ago and says still coughing . has had rx in the meantime  for infection in [place].    Preventive Screening-Counseling & Management  Alcohol-Tobacco     Smoking Status: never  Current Medications (verified): 1)  Protonix 40 Mg Tbec (Pantoprazole Sodium) .[name] Kitchen.. 1 By Mouth Two Times A Day 2)  Baby Aspirin 81 Mg  Chew (Aspirin) .[name] Kitchen.. 1 By Mouth Once Daily 3)  Vicodin 5-500 Mg  Tabs (Hydrocodone-Acetaminophen) .[name] Kitchen.. 1 Every 6 Hours As Needed Pain 4)  Vitamin D 1000 Unit  Caps (Cholecalciferol) .... 3 Once Daily 5)  Vivelle-Dot 0.1 Mg/24hr  Pttw (Estradiol) .... 2x Week 6)  Vesicare 10 Mg  Tabs (Solifenacin Succinate) .... Once Daily 7)  Temazepam 30 Mg  Caps (Temazepam) .... Take 1 By Mouth At Bedtime As Needed Sleep 8)  Testesterone Cream 4 % .... Apply Pea Size At Bedtime As Directed 9)  Nabumetone 750 Mg Tabs (Nabumetone) .[name] Kitchen.. 1 Two Times A Day Pc For Arthritis 10)  Toviaz 8 Mg Xr24h-Tab (Fesoterodine Fumarate) .[name] Kitchen.. 1 By Mouth Once Daily 11)  Hydrocodone-Homatropine 5-1.5 Mg/32ml Syrp (Hydrocodone-Homatropine) .[name] Kitchen.. 1-2 Tsp Every  4hrs As Needed For  Cough 12)  Pyridium 200 Mg Tabs (Phenazopyridine Hcl) .[name] Kitchen.. 1 Three Times A Day or Qid If Needed To Stop Painful Urination 13)  Mucinex Dm Maximum Strength 60-1200 Mg Xr12h-Tab (Dextromethorphan-Guaifenesin) .[name] Kitchen.. 1 Bid  Allergies (verified): 1)  Amoxicillin (Amoxicillin)  Past History:  Past medical, surgical, family and social histories (including risk factors) reviewed for relevance to current acute and chronic problems.  Past Medical History: Reviewed history from 08/10/2007 and no changes required. GERD Rheumatoid arthritis Low back pain  Past Surgical History: Reviewed history from 12/02/2009 and no changes required. neck surgery hysterectomy, rectocoele cyctocoele  Family History: Reviewed history and no changes required.  Social History: Reviewed history and no changes required. non smoker   Review of Systems       The patient complains of prolonged cough.  The patient denies anorexia, fever, weight loss, weight gain, decreased hearing, chest pain, syncope, dyspnea on exertion, peripheral edema, abdominal pain, abnormal bleeding, and angioedema.    Physical Exam  General:  non toxic in nad   mild congestion Head:  normocephalic and atraumatic.   Eyes:  vision grossly intact, pupils equal, and pupils round.   Ears:  R ear  normal, L ear normal, and no external deformities.   Nose:  no external deformity and no external erythema.  mid congestion Mouth:  mild erythema  no exudates no edema Neck:  tender ac glands  neg pc supple Lungs:  Normal respiratory effort, chest expands symmetrically. Lungs are clear to auscultation, no crackles or wheezes.no dullness.   Heart:  Normal rate and regular rhythm. S1 and S2 normal without gallop, murmur, click, rub or other extra sounds. Pulses:  nl cap refill  Neurologic:  non focal  Skin:  turgor normal, color normal, no ecchymoses, and no petechiae.   Cervical Nodes:  tender ac nodes  Psych:  Oriented X3, good eye contact, and  not anxious appearing.     Impression & Recommendations:  Problem # 1:  PHARYNGITIS, ACUTE (ICD-462) prob viral resp infection  dis alarm signs and .fu  as needed  The following medications were removed from the medication list:    Ciprofloxacin Hcl 500 Mg Tabs (Ciprofloxacin hcl) .[name] Kitchen... 1 two times a day  for uti    Zithromax Z-pak 250 Mg Tabs (Azithromycin) .[name] Kitchen... 2 stat then 1 each day Her updated medication list for this problem includes:    Baby Aspirin 81 Mg Chew (Aspirin) .[name] Kitchen... 1 by mouth once daily    Nabumetone 750 Mg Tabs (Nabumetone) .[name] Kitchen... 1 two times a day pc for arthritis  Orders: Rapid Strep (09381)  Problem # 2:  COUGH (ICD-786.2)   I don't  this this is  related to above but needs to .fu with PCP  for further evaluation.       she had a chest ct in the fall with no acute findings but pleural thickening.    and was rx with z pack for uri om in January  2011.    ? if rad? gerd ? other.  Complete Medication List: 1)  Protonix 40 Mg Tbec (Pantoprazole sodium) .[name] Kitchen.. 1 by mouth two times a day 2)  Baby Aspirin 81 Mg Chew (Aspirin) .[name] Kitchen.. 1 by mouth once daily 3)  Vicodin 5-500 Mg Tabs (Hydrocodone-acetaminophen) .[name] Kitchen.. 1 every 6 hours as needed pain 4)  Vitamin D 1000 Unit Caps (Cholecalciferol) .... 3 once daily 5)  Vivelle-dot 0.1 Mg/24hr Pttw (Estradiol) .... 2x week 6)  Vesicare 10 Mg Tabs (Solifenacin succinate) .... Once daily 7)  Temazepam 30 Mg Caps (Temazepam) .... Take 1 by mouth at bedtime as needed sleep 8)  Testesterone Cream 4 %  .... Apply pea size at bedtime as directed 9)  Nabumetone 750 Mg Tabs (Nabumetone) .[name] Kitchen.. 1 two times a day pc for arthritis 10)  Toviaz 8 Mg Xr24h-tab (Fesoterodine fumarate) .[name] Kitchen.. 1 by mouth once daily 11)  Hydrocodone-homatropine 5-1.5 Mg/28ml Syrp (Hydrocodone-homatropine) .[name] Kitchen.. 1-2 tsp every  4hrs as needed for cough 12)  Pyridium 200 Mg Tabs (Phenazopyridine hcl) .[name] Kitchen.. 1 three times a day or qid if needed to stop painful urination 13)   Mucinex Dm Maximum Strength 60-1200 Mg Xr12h-tab (Dextromethorphan-guaifenesin) .[name] Kitchen.. 1 bid  Patient Instructions: 1)  this is probably viral rti and could get worse before gets bettter. 2)  call  or recheck if fever lasting more than 3 days or chest pain shortness of  breath  Laboratory Results   Date/Time Reported: Lamar Sprinkles, CMA  January 02, 2010 2:13 PM   Other Tests  Rapid Strep: negative   Appended Document: SWOLLEN GLAND/PN review

## 2010-12-09 NOTE — Miscellaneous (Signed)
Summary: Controlled Substances Agreement  Controlled Substances Agreement   Imported By: Maryln Gottron 07/23/2010 15:19:47  _____________________________________________________________________  External Attachment:    Type:   Image     Comment:   External Document

## 2010-12-09 NOTE — Assessment & Plan Note (Signed)
Summary: ?BRONCHITITS/NJR   Vital Signs:  Patient profile:   60 year old female Weight:      228.2 pounds O2 Sat:      93 % Temp:     98.1 degrees F Pulse rate:   104 / minute Pulse rhythm:   regular BP sitting:   114 / 82  (left arm) Cuff size:   large  Vitals Entered By: Pura Spice, RN (December 02, 2009 3:11 PM) CC: left earaach green sputum cough congestion achy denies nausea vomiting chills    History of Present Illness: This 60 year old white female comes in with a three-day history of left ear pain coughing aching congested general malaise, increasing in severity. PO2 93% No nausea vomiting or diarrhea no chills, uncertain about fever Complaint of draining of the nose with clear drainage as stated above does complain of aching left ear Blood pressure stable Continues to have problem with chronic low back pain GERD is controlled with Protonix Overactive bladder had been best controlled her toes he has 8 mg each night States husband has cough congestion and like treatment if possible  Allergies: 1)  Amoxicillin (Amoxicillin)  Past History:  Past Medical History: Last updated: 08/10/2007 GERD Rheumatoid arthritis Low back pain  Past Surgical History: neck surgery hysterectomy, rectocoele cyctocoele  Review of Systems  The patient denies anorexia, fever, weight loss, weight gain, vision loss, decreased hearing, hoarseness, chest pain, syncope, dyspnea on exertion, peripheral edema, prolonged cough, headaches, hemoptysis, abdominal pain, melena, hematochezia, severe indigestion/heartburn, hematuria, incontinence, genital sores, muscle weakness, suspicious skin lesions, transient blindness, difficulty walking, depression, unusual weight change, abnormal bleeding, enlarged lymph nodes, angioedema, breast masses, and testicular masses.    Physical Exam  General:  Well-developed,well-nourished,in no acute distress; alert,appropriate and cooperative throughout  examination Head:  Normocephalic and atraumatic without obvious abnormalities. No apparent alopecia or balding. Eyes:  No corneal or conjunctival inflammation noted. EOMI. Perrla. Funduscopic exam benign, without hemorrhages, exudates or papilledema. Vision grossly normal. Ears:  left tympanic membranes dull in upper and showing wide in the lower Right TM normal Nose:  right nostril edematous clear drainage left nostril clear Mouth:  Oral mucosa and oropharynx without lesions or exudates.  Teeth in good repair. Neck:  No deformities, masses, or tenderness noted. Lungs:  rhonchi bilaterally, no dullness to percussion, no wheezing pO2 93% Heart:  Normal rate and regular rhythm. S1 and S2 normal without gallop, murmur, click, rub or other extra sounds. Abdomen:  Bowel sounds positive,abdomen soft and non-tender without masses, organomegaly or hernias noted. Rectal:  on exam an Genitalia:  none exam Msk:  tenderness left knee medially Extremities:  no edema   Impression & Recommendations:  Problem # 1:  ACUTE BRONCHITIS (ICD-466.0) Assessment New  Her updated medication list for this problem includes:    Hydrocodone-homatropine 5-1.5 Mg/62ml Syrp (Hydrocodone-homatropine) .[name] Kitchen... 1-2 tsp every  4hrs as needed for cough    Ciprofloxacin Hcl 500 Mg Tabs (Ciprofloxacin hcl) .[name] Kitchen... 1 two times a day  for uti    Zithromax Z-pak 250 Mg Tabs (Azithromycin) .[name] Kitchen... 2 stat then 1 each day    Mucinex Dm Maximum Strength 60-1200 Mg Xr12h-tab (Dextromethorphan-guaifenesin) .[name] Kitchen... 1 bid  Orders: Prescription Created Electronically [phone])  Problem # 2:  OTITIS MEDIA (ICD-382.9) Assessment: New  Her updated medication list for this problem includes:    Baby Aspirin 81 Mg Chew (Aspirin) .[name] Kitchen... 1 by mouth once daily    Nabumetone 750 Mg Tabs (Nabumetone) .[name] Kitchen... 1 two times a day  pc for arthritis    Ciprofloxacin Hcl 500 Mg Tabs (Ciprofloxacin hcl) .[name] Kitchen... 1 two times a day  for uti    Zithromax Z-pak 250 Mg  Tabs (Azithromycin) .[name] Kitchen... 2 stat then 1 each day  Problem # 3:  ALLERGIC RHINITIS CAUSE UNSPECIFIED (ICD-477.9) Assessment: Unchanged  Problem # 4:  EXOGENOUS OBESITY (ICD-278.00) Assessment: Improved lost 3 pounds  Problem # 5:  ARTHRITIS, KNEE (ICD-716.96) Assessment: Unchanged  Problem # 6:  LOW BACK PAIN, CHRONIC (ICD-724.2) Assessment: Unchanged  Her updated medication list for this problem includes:    Baby Aspirin 81 Mg Chew (Aspirin) .[name] Kitchen... 1 by mouth once daily    Vicodin 5-500 Mg Tabs (Hydrocodone-acetaminophen) .[name] Kitchen... 1 every 6 hours as needed pain    Nabumetone 750 Mg Tabs (Nabumetone) .[name] Kitchen... 1 two times a day pc for arthritis  Problem # 7:  OVERACTIVE BLADDER (ICD-596.51) Assessment: Improved  Problem # 8:  GERD (ICD-530.81) Assessment: Improved  Her updated medication list for this problem includes:    Protonix 40 Mg Tbec (Pantoprazole sodium) .[name] Kitchen... 1 by mouth two times a day  Complete Medication List: 1)  Protonix 40 Mg Tbec (Pantoprazole sodium) .[name] Kitchen.. 1 by mouth two times a day 2)  Baby Aspirin 81 Mg Chew (Aspirin) .[name] Kitchen.. 1 by mouth once daily 3)  Vicodin 5-500 Mg Tabs (Hydrocodone-acetaminophen) .[name] Kitchen.. 1 every 6 hours as needed pain 4)  Vitamin D 1000 Unit Caps (Cholecalciferol) .... 3 once daily 5)  Vivelle-dot 0.1 Mg/24hr Pttw (Estradiol) .... 2x week 6)  Vesicare 10 Mg Tabs (Solifenacin succinate) .... Once daily 7)  Temazepam 30 Mg Caps (Temazepam) .... Take 1 by mouth at bedtime as needed sleep 8)  Testesterone Cream 4 %  .... Apply pea size at bedtime as directed 9)  Premarin 0.625 Mg/gm Crea (Estrogens, conjugated) .... Insert 1/2 applicator 2 x per week 10)  Nabumetone 750 Mg Tabs (Nabumetone) .[name] Kitchen.. 1 two times a day pc for arthritis 11)  Toviaz 8 Mg Xr24h-tab (Fesoterodine fumarate) .[name] Kitchen.. 1 by mouth once daily 12)  Hydrocodone-homatropine 5-1.5 Mg/17ml Syrp (Hydrocodone-homatropine) .[name] Kitchen.. 1-2 tsp every  4hrs as needed for cough 13)  Ciprofloxacin Hcl 500 Mg  Tabs (Ciprofloxacin hcl) .[name] Kitchen.. 1 two times a day  for uti 14)  Pyridium 200 Mg Tabs (Phenazopyridine hcl) .[name] Kitchen.. 1 three times a day or qid if needed to stop painful urination 15)  Zithromax Z-pak 250 Mg Tabs (Azithromycin) .... 2 stat then 1 each day 16)  Mucinex Dm Maximum Strength 60-1200 Mg Xr12h-tab (Dextromethorphan-guaifenesin) .[name] Kitchen.. 1 bid  Patient Instructions: 1)  Your acute problems or bronchitis and infection left ear or otitis media.Z-Pak prescribed for infection, and Mucinex DM Maximum strength 2)  Continue treatment for chronic low back pain and other medical problems.  Prescriptions: ZITHROMAX Z-PAK 250 MG TABS (AZITHROMYCIN) 2 stat then 1 each day  #1 pkge x 0   Entered and Authorized by:   Judithann Sheen MD   Signed by:   Judithann Sheen MD on 12/02/2009   Method used:   Electronically to        [facility] [id number]* (retail)       [address]       [place], [place]  [address]       Ph: [phone]       Fax: [phone]   RxID:   [id number]

## 2010-12-09 NOTE — Assessment & Plan Note (Signed)
Summary: pain around belly button/njr   Vital Signs:  Patient profile:   60 year old female Height:      68 inches (172.72 cm) Weight:      231 pounds (105.00 kg) O2 Sat:      98 % on Room air Temp:     98.2 degrees F (36.78 degrees C) oral Pulse rate:   102 / minute BP sitting:   122 / 88  (left arm) Cuff size:   large  Vitals Entered By: Josph Macho RMA (July 19, 2010 3:40 PM)  O2 Flow:  Room air CC: Pain around belly button on left side X 3 days/ Arthritis/ CF   History of Present Illness: patient is a 60 her old Caucasian female in today with complaints of periumbilical abdominal pain. She notes the pain has been intermittent but recurring over a three-day period of time. She denies fevers, chills, nausea, vomiting, anorexia or diarrhea.she does have a long history of intermittent constipation. Over the years she has had multiple abdominal surgeries including a hysterectomy for cervical cancer, 4 bladder slings and even a rectal repair. She has been told by her Gynecologist that she should avoid any straining when she has a BM so she will frequently have to disimpact herself manually. She sees blood in and on the stool frequently and has previously been told she has internal hemorrhoids, the rectal bleeding has become more frequent as of late and she can even see blood imbetween bowel movements on her incontinence pad. She has a long h/o urinary incontinence which initially was improved on Toviaz but has recently worsened again. At times she will have some dribbling and at times a full gush. Her perimbilical pain only last minutes and does not appear to be affected by position changes, eating, not eating, BM or urination. No back pain/CP/palp/SOB. She does note she has recently noted increase in hoarseness, hiccups, dyspepsia and anorexia. She reports her bowels will fluctuate from moving small, hard amounts 2-3 x daily to not moving for 3 or more days.  Current Medications  (verified): 1)  Protonix 40 Mg Tbec (Pantoprazole Sodium) .[name] Kitchen.. 1 By Mouth Two Times A Day 2)  Baby Aspirin 81 Mg  Chew (Aspirin) .[name] Kitchen.. 1 By Mouth Once Daily 3)  Vicodin 5-500 Mg  Tabs (Hydrocodone-Acetaminophen) .[name] Kitchen.. 1 Every 6 Hours As Needed Pain 4)  Vitamin D 1000 Unit  Caps (Cholecalciferol) .... 3 Once Daily 5)  Vivelle-Dot 0.1 Mg/24hr  Pttw (Estradiol) .... 2x Week 6)  Tesstosterone Cream 4% .... Apply Pea Size At Bedtime As Directed 7)  Nabumetone 750 Mg Tabs (Nabumetone) .[name] Kitchen.. 1 Two Times A Day Pc For Arthritis 8)  Toviaz 8 Mg Xr24h-Tab (Fesoterodine Fumarate) .[name] Kitchen.. 1 By Mouth Once Daily 9)  Hydrocodone-Homatropine 5-1.5 Mg/26ml Syrp (Hydrocodone-Homatropine) .[name] Kitchen.. 1-2 Tsp Every  4hrs As Needed For Cough 10)  Tramadol Hcl 50 Mg Tabs (Tramadol Hcl) .[name] Kitchen.. 1 Qid To Prevent Cough 11)  Phenteramine 37.5 .[name] Kitchen.. 1 Qam To Decrease Appetitie  Allergies (verified): 1)  Amoxicillin (Amoxicillin)  Past History:  Past medical history reviewed for relevance to current acute and chronic problems. Social history (including risk factors) reviewed for relevance to current acute and chronic problems.  Past Medical History: Reviewed history from 08/10/2007 and no changes required. GERD Rheumatoid arthritis Low back pain  Social History: Reviewed history from 01/02/2010 and no changes required. non smoker   Review of Systems      See HPI  Physical Exam  General:  Well-developed,well-nourished,in  no acute distress; alert,appropriate and cooperative throughout examination Mouth:  Oral mucosa and oropharynx without lesions or exudates.  Teeth in good repair. Mild erythema in posterior oropharynx Neck:  No deformities, masses, or tenderness noted. Lungs:  Normal respiratory effort, chest expands symmetrically. Lungs are clear to auscultation, no crackles or wheezes. Heart:  Normal rate and regular rhythm. S1 and S2 normal without gallop, murmur, click, rub or other extra sounds. Abdomen:  Bowel sounds  positive,abdomen soft and non-tender without masses, organomegaly or hernias noted. Mild periumbilical firmness with palpation but no appreciable hernia.  Extremities:  No clubbing, cyanosis, edema, or deformity noted  Psych:  Cognition and judgment appear intact. Alert and cooperative with normal attention span and concentration. No apparent delusions, illusions, hallucinations   Impression & Recommendations:  Problem # 1:  ABDOMINAL PAIN, PERIUMBILICAL (ICD-789.05)  Orders: Gastroenterology Referral (GI) TLB-CBC Platelet - w/Differential (85025-CBCD) TLB-BMP (Basic Metabolic Panel-BMET) (80048-METABOL) TLB-Hepatic/Liver Function Pnl (80076-HEPATIC) TLB-H. Pylori Abs(Helicobacter Pylori) (86677-HELICO) UA Dipstick w/o Micro (automated)  (81003) T-Abdomen 2-view (74020TC) Specimen Handling (47829) Venipuncture (56213) with rectal bleeding also noted recommend new consultation with GI and had her add a fiber supplement two times a day with a probiotic daily. If inadequate response consider Miralax, MOM and Dulcolax suppositories as directed If H Pylori is neg will have her add Zantac daily for a few months to see if it helps dyspepsia  Problem # 2:  OVERACTIVE BLADDER (ICD-596.51) Cont Toviaz for now and check a urine dip  Complete Medication List: 1)  Protonix 40 Mg Tbec (Pantoprazole sodium) .[name] Kitchen.. 1 by mouth two times a day 2)  Baby Aspirin 81 Mg Chew (Aspirin) .[name] Kitchen.. 1 by mouth once daily 3)  Vicodin 5-500 Mg Tabs (Hydrocodone-acetaminophen) .[name] Kitchen.. 1 every 6 hours as needed pain 4)  Vitamin D 1000 Unit Caps (Cholecalciferol) .... 3 once daily 5)  Vivelle-dot 0.1 Mg/24hr Pttw (Estradiol) .... 2x week 6)  Tesstosterone Cream 4%  .... Apply pea size at bedtime as directed 7)  Nabumetone 750 Mg Tabs (Nabumetone) .[name] Kitchen.. 1 two times a day pc for arthritis 8)  Toviaz 8 Mg Xr24h-tab (Fesoterodine fumarate) .[name] Kitchen.. 1 by mouth once daily 9)  Hydrocodone-homatropine 5-1.5 Mg/58ml Syrp  (Hydrocodone-homatropine) .[name] Kitchen.. 1-2 tsp every  4hrs as needed for cough 10)  Tramadol Hcl 50 Mg Tabs (Tramadol hcl) .[name] Kitchen.. 1 qid to prevent cough 11)  Phenteramine 37.5  .[name] Kitchen.. 1 qam to decrease appetitie 12)  Align 4 Mg Caps (Probiotic product) .[name] Kitchen.. 1 cap by mouth daily 13)  Benefiber Powd (Wheat dextrin) .... 2 tsp by mouth two times a day in 8 oz of fluids or food 14)  Miralax Powd (Polyethylene glycol 3350) .... 17mg  in 8 oz fluids by mouth daily as needed constipation.  Patient Instructions: 1)  Please schedule a follow-up appointment as needed if symptoms worsen or do not improve. 2)  For constipation take Benefiber or Metamucil two times a day  3)  Align capsules daily 4)  If still having constipation add Miralax 17 gm daily in fluids 5)  If you hit day 2-3 and no BM use Milk of Magnesia 2 tbls by mouth and Dulcolax suppository per rectum at same time and repeat in 4-6 hours if no results. 6)  Maintain 64 oz of clear liquid intake daily, hi fiber diet and regular exercise  Appended Document: pain around belly button/njr  Laboratory Results   Urine Tests    Routine Urinalysis   Color: yellow Appearance: Clear Glucose: negative   (Normal  Range: Negative) Bilirubin: negative   (Normal Range: Negative) Ketone: negative   (Normal Range: Negative) Spec. Gravity: 1.020   (Normal Range: 1.003-1.035) Blood: negative   (Normal Range: Negative) pH: 5.0   (Normal Range: 5.0-8.0) Protein: negative   (Normal Range: Negative) Urobilinogen: 0.2   (Normal Range: 0-1) Nitrite: positive   (Normal Range: Negative) Leukocyte Esterace: 1+   (Normal Range: Negative)    Comments: Rita Ohara  July 19, 2010 4:45 PM    Prescriptions: MACROBID 100 MG CAPS (NITROFURANTOIN MONOHYD MACRO) One capsule two times a day X5 days  #10 x 0   Entered by:   Josph Macho RMA   Authorized by:   Danise Edge MD   Signed by:   Josph Macho RMA on 07/20/2010   Method used:   Electronically to         [id number]* (retail)       [address]       [place], [place]  [address]       Ph: [phone]       Fax: [phone]   RxID:   [id number]  urine likely infected have her start Macrobid two times a day x 5 days Pt informed/ CF

## 2010-12-09 NOTE — Assessment & Plan Note (Signed)
Summary: cpx/no pap/njr   Vital Signs:  Patient profile:   60 year old female Height:      68 inches Weight:      233 pounds BMI:     35.56 O2 Sat:      96 % Temp:     97.9 degrees F Pulse rate:   82 / minute Pulse rhythm:   regular BP sitting:   114 / 82  (left arm) Cuff size:   large  Vitals Entered By: Pura Spice, RN (Mar 18, 2010 10:22 AM) CC: cpx no pap    History of Present Illness: This 60 year old white married obese female is in for a complete physical examination without pelvic and Pap since she sees Dr. Nicholas Lose her gynecologist. Mammogram due October 2011 and colonoscopic exam views 2012 She relates she has had a very difficult time to lose weight and had not lost any weight over this past year does not exercise and has made no vigorous effort to lose light . She desires to try phentermine as a anorectic agent, low powered and restart her exercise were She relates she stays tired all the time it has her low energy level. Has had previous cervical surgery and is now having some neck pain and has a couple more Dr. Channing Mutters, neurosurgeon testosterone cream helps as far as increase in her sex drive,his arteries fill Over the past year she's had some problem with recurrent bronchitis and cough and this is then much improved since her last treatment Continues dialysis on March 3 pain and pain in her knees but these discomforts have been much improved with nabumetone Continues to have urinary incontinence and urgency but this is been improved by using her taking  toviaz 8 mg GERD improved  EKG  Procedure date:  03/18/2010  Findings:      sinus rhythm with rate of:  75 Septal ST changes are nosnspecifii  Low QRS voltages in precordial leads   Allergies: 1)  Amoxicillin (Amoxicillin)  Past History:  Past Medical History: Last updated: 08/10/2007 GERD Rheumatoid arthritis Low back pain  Past Surgical History: Last updated: 12/02/2009 neck surgery hysterectomy,  rectocoele cyctocoele  Social History: Last updated: 01/02/2010 non smoker   Risk Factors: Smoking Status: never (01/02/2010)  Past History:  Care Management: Gynecology: [place]   Review of Systems      See HPI  The patient denies anorexia, fever, weight loss, weight gain, vision loss, decreased hearing, hoarseness, chest pain, syncope, dyspnea on exertion, peripheral edema, prolonged cough, headaches, hemoptysis, abdominal pain, melena, hematochezia, severe indigestion/heartburn, hematuria, incontinence, genital sores, muscle weakness, suspicious skin lesions, transient blindness, difficulty walking, depression, unusual weight change, abnormal bleeding, enlarged lymph nodes, angioedema, breast masses, and testicular masses.    Physical Exam  General:  Well-developed,well-nourished,in no acute distress; alert,appropriate and cooperative throughout examinationoverweight-appearing.   Head:  Normocephalic and atraumatic without obvious abnormalities. No apparent alopecia or balding. Eyes:  No corneal or conjunctival inflammation noted. EOMI. Perrla. Funduscopic exam benign, without hemorrhages, exudates or papilledema. Vision grossly normal. Ears:  External ear exam shows no significant lesions or deformities.  Otoscopic examination reveals clear canals, tympanic membranes are intact bilaterally without bulging, retraction, inflammation or discharge. Hearing is grossly normal bilaterally. Nose:  External nasal examination shows no deformity or inflammation. Nasal mucosa are pink and moist without lesions or exudates. Mouth:  Oral mucosa and oropharynx without lesions or exudates.  Teeth in good repair. Neck:  tenderness bilaterally C3- 7 Chest Wall:  No deformities,  masses, or tenderness noted. Breasts:  No mass, nodules, thickening, tenderness, bulging, retraction, inflamation, nipple discharge or skin changes noted.   Lungs:  Normal respiratory effort, chest expands symmetrically. Lungs  are clear to auscultation, no crackles or wheezes. Heart:  Normal rate and regular rhythm. S1 and S2 normal without gallop, murmur, click, rub or other extra sounds. Abdomen:  Bowel sounds positive,abdomen soft and non-tender without masses, organomegaly or hernias noted., obese Rectal:  not examined Genitalia:  not examined Msk:  tenderness cervical spine, mild Knee exam negative Pulses:  R and L carotid,radial,femoral,dorsalis pedis and posterior tibial pulses are full and equal bilaterally Extremities:  No clubbing, cyanosis, edema, or deformity noted with normal full range of motion of all joints.   Neurologic:  No cranial nerve deficits noted. Station and gait are normal. Plantar reflexes are down-going bilaterally. DTRs are symmetrical throughout. Sensory, motor and coordinative functions appear intact. Skin:  Intact without suspicious lesions or rashes Cervical Nodes:  No lymphadenopathy noted Axillary Nodes:  No palpable lymphadenopathy Inguinal Nodes:  No significant adenopathy Psych:  Cognition and judgment appear intact. Alert and cooperative with normal attention span and concentration. No apparent delusions, illusions, hallucinations   Impression & Recommendations:  Problem # 1:  PHYSICAL EXAMINATION (ICD-V70.0) Assessment New  Orders: EKG w/ Interpretation (93000) EKG revealed no acute problems no change from previous EKG  Problem # 2:  ARTHRITIS, KNEE (ICD-716.96) Assessment: Improved  Problem # 3:  EXOGENOUS OBESITY (ICD-278.00) Assessment: Unchanged phentermine 37.5 mg each morning to decrease appetite Low calorie Plan exercise her  Problem # 4:  OVERACTIVE BLADDER (ICD-596.51) Assessment: Improved  Problem # 5:  GERD (ICD-530.81) Assessment: Improved  Her updated medication list for this problem includes:    Protonix 40 Mg Tbec (Pantoprazole sodium) .[name] Kitchen... 1 by mouth two times a day  Complete Medication List: 1)  Protonix 40 Mg Tbec (Pantoprazole sodium)  .[name] Kitchen.. 1 by mouth two times a day 2)  Baby Aspirin 81 Mg Chew (Aspirin) .[name] Kitchen.. 1 by mouth once daily 3)  Vicodin 5-500 Mg Tabs (Hydrocodone-acetaminophen) .[name] Kitchen.. 1 every 6 hours as needed pain 4)  Vitamin D 1000 Unit Caps (Cholecalciferol) .... 3 once daily 5)  Vivelle-dot 0.1 Mg/24hr Pttw (Estradiol) .... 2x week 6)  Tesstosterone Cream 4%  .... Apply pea size at bedtime as directed 7)  Nabumetone 750 Mg Tabs (Nabumetone) .[name] Kitchen.. 1 two times a day pc for arthritis 8)  Toviaz 8 Mg Xr24h-tab (Fesoterodine fumarate) .[name] Kitchen.. 1 by mouth once daily 9)  Hydrocodone-homatropine 5-1.5 Mg/76ml Syrp (Hydrocodone-homatropine) .[name] Kitchen.. 1-2 tsp every  4hrs as needed for cough 10)  Tramadol Hcl 50 Mg Tabs (Tramadol hcl) .[name] Kitchen.. 1 qid to prevent cough 11)  Phenteramine 37.5  .[name] Kitchen.. 1 qam to decrease appetitie  Patient Instructions: 1)  Physical examination reveals no acute nor new problem 2)  You need to lose weight. Consider a lower calorie diet and regular exercise.  3)  It is important that you exercise reguarly at least 20 minutes 5 times a week. If you develop chest pain, have severe difficulty breathing, or feel very tired, stop exercising immediately and seek medical attention.  4)  Lab studies were good as we discussed 5)  Refill medications Prescriptions: PHENTERAMINE 37.5 1 qAM to decrease appetitie  #30 x 3   Entered and Authorized by:   Judithann Sheen MD   Signed by:   Judithann Sheen MD on 03/18/2010   Method used:   Print then Give to Patient  RxID:   [id number] TESSTOSTERONE CREAM 4% apply pea size at bedtime as directed  #15 gms x 5   Entered and Authorized by:   Judithann Sheen MD   Signed by:   Judithann Sheen MD on 03/18/2010   Method used:   Print then Give to Patient   RxID:   [id number] TOVIAZ 8 MG XR24H-TAB (FESOTERODINE FUMARATE) 1 by mouth once daily  #30 x 11   Entered and Authorized by:   Judithann Sheen MD   Signed by:   Judithann Sheen MD on  03/18/2010   Method used:   Electronically to        [facility] # [address] (retail)       [address]       [place], [place]  [address]       Ph: [phone]       Fax: [phone]   RxID:   [id number] NABUMETONE 750 MG TABS (NABUMETONE) 1 two times a day pc for arthritis  #60 x 11   Entered and Authorized by:   Judithann Sheen MD   Signed by:   Judithann Sheen MD on 03/18/2010   Method used:   Electronically to        [facility] # [address] (retail)       [address]       [place], [place]  [address]       Ph: [phone]       Fax: [phone]   RxID:   [id number] PROTONIX 40 MG TBEC (PANTOPRAZOLE SODIUM) 1 by mouth two times a day  #60 x 11   Entered and Authorized by:   Judithann Sheen MD   Signed by:   Judithann Sheen MD on 03/18/2010   Method used:   Electronically to        [facility] # [address] (retail)       [address]       [place], [place]  [address]       Ph: [phone]       Fax: [phone]   RxID:   [id number]

## 2010-12-09 NOTE — Progress Notes (Signed)
Summary: Pantoprazole approved from 04-07-2010 to 04-08-2011   Phone Note Outgoing Call   Action Taken: Phone Call Completed Summary of Call: Per Mitch @ [facility]--Pantoprazole 40mg  approved from 04-07-2010 through 04-08-2011. Spoke with AJ @ [facility]---aware. Pt is aware also. Initial call taken by: Warnell Forester,  May 18, 2010 4:30 PM

## 2010-12-09 NOTE — Progress Notes (Signed)
Summary: Abd Pain,constipation,gerd-wants sooner appt   Phone Note From Other Clinic   Caller: [facility] @ Dr Abner Greenspan [phone] Call For: Dr Russella Dar Reason for Call: Schedule Patient Appt Summary of Call: Having abd pain, constipation and rectal bleeding and Genella Rife and feels that next available appt in Nov is too far away.  Initial call taken by: Leanor Kail [facility],  July 20, 2010 1:34 PM  Follow-up for Phone Call        patient rescheduled to see Willette Cluster RNP 07/21/10 10:00.  Voice mail left for Terri  Follow-up by: Darcey Nora RN, CGRN,  July 20, 2010 2:14 PM

## 2010-12-09 NOTE — Progress Notes (Signed)
Summary: rx vicodin w 5 refills   Phone Note From Pharmacy   [phone]  Summary of Call: refill hydrocodone 5/500 Initial call taken by: Pura Spice, RN,  April 28, 2010 8:12 AM  Follow-up for Phone Call        ok w/5 refills per dr Scotty Court. [facility]  Follow-up by: Pura Spice, RN,  April 28, 2010 8:12 AM    New/Updated Medications: VICODIN 5-500 MG  TABS (HYDROCODONE-ACETAMINOPHEN) 1 every 6 hours as needed pain Prescriptions: VICODIN 5-500 MG  TABS (HYDROCODONE-ACETAMINOPHEN) 1 every 6 hours as needed pain  #60 x 5   Entered by:   Pura Spice, RN   Authorized by:   Judithann Sheen MD   Signed by:   Pura Spice, RN on 04/28/2010   Method used:   Printed then faxed to ...       [facility] [address] (retail)       [address]       [place], [place]  [address]       Ph: [phone]       Fax: [phone]   RxID:   [id number]

## 2010-12-09 NOTE — Procedures (Signed)
Summary: Colonoscopy   Colonoscopy  Procedure date:  11/14/2003  Findings:      Results: Hemorrhoids.     Location:  [facility].    Procedures Next Due Date:    Colonoscopy: 11/2010 Patient Name: [name], [name] MRN:  Procedure Procedures: Colonoscopy CPT: 27253.  Personnel: Endoscopist: Venita Lick. Russella Dar, MD, Clementeen Graham.  Referred By: Valetta Mole Swords, MD.  Exam Location: Exam performed in Outpatient Clinic. Outpatient  Patient Consent: Procedure, Alternatives, Risks and Benefits discussed, consent obtained, from patient. Consent was obtained by the RN.  Indications  Average Risk Screening Routine.  History  Current Medications: Patient is not currently taking Coumadin.  Pre-Exam Physical: Performed Nov 14, 2003. Entire physical exam was normal.  Exam Exam: Extent of exam reached: Cecum, extent intended: Cecum.  The cecum was identified by appendiceal orifice and IC valve. Colon retroflexion performed. ASA Classification: II. Tolerance: good.  Monitoring: Pulse and BP monitoring, Oximetry used. Supplemental O2 given.  Colon Prep Used Visicol for colon prep. Prep results: good.  Sedation Meds: Patient assessed and found to be appropriate for moderate (conscious) sedation. Fentanyl 100 mcg. given IV. Versed 9 mg. given IV.  Comments: tortuous colon-moderately difficult procedure Findings NORMAL EXAM: Cecum to Sigmoid Colon.  HEMORRHOIDS: Internal. Size: Small. Not bleeding. Not thrombosed. ICD9: Hemorrhoids, Internal: 455.0.   Assessment  Diagnoses: 455.0: Hemorrhoids, Internal.   Events  Unplanned Interventions: No intervention was required.  Unplanned Events: There were no complications. Plans Medication Plan: Continue current medications.  Patient Education: Patient given standard instructions for: Hemorrhoids.  Disposition: After procedure patient sent to recovery. After recovery patient sent home.  Scheduling/Referral: Colonoscopy,  to [facility] T. Russella Dar, MD, [facility], around Nov 13, 2010.    This report was created from the original endoscopy report, which was reviewed and signed by the above listed endoscopist.    cc: Lindley Magnus, MD

## 2010-12-09 NOTE — Consult Note (Signed)
[facility]: [facility]   Imported By: Sherian Rein 09/21/2010 11:21:40  _____________________________________________________________________  External Attachment:    Type:   Image     Comment:   External Document

## 2010-12-09 NOTE — Assessment & Plan Note (Signed)
Summary: COUGH, CONGESTION // RS   Vital Signs:  Patient profile:   60 year old female Weight:      230 pounds O2 Sat:      97 % on Room air Temp:     98.4 degrees F oral BP sitting:   122 / 76  (left arm) Cuff size:   large  Vitals Entered By: Alfred Levins, CMA (January 05, 2010 3:34 PM)  O2 Flow:  Room air CC: cough, congestion, green mucus   History of Present Illness: this 60 year old white married female in today complaining of sore throat and she has noticed cervical nodes she is also complaining of coughing congestion and productive sputum green in color. She has an acute exacerbation at this time but has had recurrent cough and congestion over the past month he was seen this past Saturday in the lower clinic over the sore throat and cough and was diagnosed as a viral infection. Since that time she has increased symptoms and increased severity of symptoms or it she has no fever or general malaise aching, nontender to No gastrointestinal nor GU complaints  Allergies: 1)  Amoxicillin (Amoxicillin)  Past History:  Past Medical History: Last updated: 08/10/2007 GERD Rheumatoid arthritis Low back pain  Past Surgical History: Last updated: 12/02/2009 neck surgery hysterectomy, rectocoele cyctocoele  Social History: Last updated: 01/02/2010 non smoker   Risk Factors: Smoking Status: never (01/02/2010)  Review of Systems  The patient denies anorexia, fever, weight loss, weight gain, vision loss, decreased hearing, hoarseness, chest pain, syncope, dyspnea on exertion, peripheral edema, prolonged cough, headaches, hemoptysis, abdominal pain, melena, hematochezia, severe indigestion/heartburn, hematuria, incontinence, genital sores, muscle weakness, suspicious skin lesions, transient blindness, difficulty walking, depression, unusual weight change, abnormal bleeding, enlarged lymph nodes, angioedema, breast masses, and testicular masses.    Physical Exam  General:   Well-developed,well-nourished,in no acute distress; alert,appropriate and cooperative throughout examinationoverweight-appearing.   Head:  Normocephalic and atraumatic without obvious abnormalities. No apparent alopecia or balding. Eyes:  No corneal or conjunctival inflammation noted. EOMI. Perrla. Funduscopic exam benign, without hemorrhages, exudates or papilledema. Vision grossly normal. Ears:  External ear exam shows no significant lesions or deformities.  Otoscopic examination reveals clear canals, tympanic membranes are intact bilaterally without bulging, retraction, inflammation or discharge. Hearing is grossly normal bilaterally. Nose:  nasal congestion with discolored drainage Mouth:  erythematous pharynx also anterior cervical nodes that are tender Neck:  No deformities, masses, or tenderness noted. Chest Wall:  No deformities, masses, or tenderness noted. Lungs:  rhonchi bilaterally with slight expiratory wheezes at both bases on expiration. Occasional rales bilaterally Heart:  Normal rate and regular rhythm. S1 and S2 normal without gallop, murmur, click, rub or other extra sounds. Abdomen:  Bowel sounds positive,abdomen soft and non-tender without masses, organomegaly or hernias noted. Extremities:  No clubbing, cyanosis, edema, or deformity noted with normal full range of motion of all joints.     Impression & Recommendations:  Problem # 1:  PHARYNGITIS, ACUTE (ICD-462) Assessment Deteriorated  Her updated medication list for this problem includes:    Baby Aspirin 81 Mg Chew (Aspirin) .[name] Kitchen... 1 by mouth once daily    Nabumetone 750 Mg Tabs (Nabumetone) .[name] Kitchen... 1 two times a day pc for arthritis    Levaquin 750 Mg Tabs (Levofloxacin) .[name] Kitchen... 1 once daily for infection  Orders: TLB-CBC Platelet - w/Differential (85025-CBCD)  Problem # 2:  ACUTE BRONCHITIS (ICD-466.0) Assessment: Deteriorated  Her updated medication list for this problem includes:    Hydrocodone-homatropine  5-1.5  Mg/5ml Syrp (Hydrocodone-homatropine) .[name] Kitchen... 1-2 tsp every  4hrs as needed for cough    Mucinex Dm Maximum Strength 60-1200 Mg Xr12h-tab (Dextromethorphan-guaifenesin) .[name] Kitchen... 1 bid    Levaquin 750 Mg Tabs (Levofloxacin) .[name] Kitchen... 1 once daily for infection  Orders: TLB-CBC Platelet - w/Differential (85025-CBCD)  Problem # 3:  VAGINITIS, ATROPHIC (ICD-627.3) Assessment: Improved  Her updated medication list for this problem includes:    Vivelle-dot 0.1 Mg/24hr Pttw (Estradiol) .[name] Kitchen... 2x week  Problem # 4:  EXOGENOUS OBESITY (ICD-278.00) Assessment: Unchanged  Problem # 5:  OVERACTIVE BLADDER (ICD-596.51) Assessment: Improved  Problem # 6:  GERD (ICD-530.81) Assessment: Improved  Her updated medication list for this problem includes:    Protonix 40 Mg Tbec (Pantoprazole sodium) .[name] Kitchen... 1 by mouth two times a day  Complete Medication List: 1)  Protonix 40 Mg Tbec (Pantoprazole sodium) .[name] Kitchen.. 1 by mouth two times a day 2)  Baby Aspirin 81 Mg Chew (Aspirin) .[name] Kitchen.. 1 by mouth once daily 3)  Vicodin 5-500 Mg Tabs (Hydrocodone-acetaminophen) .[name] Kitchen.. 1 every 6 hours as needed pain 4)  Vitamin D 1000 Unit Caps (Cholecalciferol) .... 3 once daily 5)  Vivelle-dot 0.1 Mg/24hr Pttw (Estradiol) .... 2x week 6)  Vesicare 10 Mg Tabs (Solifenacin succinate) .... Once daily 7)  Temazepam 30 Mg Caps (Temazepam) .... Take 1 by mouth at bedtime as needed sleep 8)  Testesterone Cream 4 %  .... Apply pea size at bedtime as directed 9)  Nabumetone 750 Mg Tabs (Nabumetone) .[name] Kitchen.. 1 two times a day pc for arthritis 10)  Toviaz 8 Mg Xr24h-tab (Fesoterodine fumarate) .[name] Kitchen.. 1 by mouth once daily 11)  Hydrocodone-homatropine 5-1.5 Mg/68ml Syrp (Hydrocodone-homatropine) .[name] Kitchen.. 1-2 tsp every  4hrs as needed for cough 12)  Mucinex Dm Maximum Strength 60-1200 Mg Xr12h-tab (Dextromethorphan-guaifenesin) .[name] Kitchen.. 1 bid 13)  Levaquin 750 Mg Tabs (Levofloxacin) .[name] Kitchen.. 1 once daily for infection 14)  Tramadol Hcl 50 Mg Tabs (Tramadol hcl) .[name] Kitchen..  1 qid to prevent cough  Other Orders: Venipuncture (63875) Depo- Medrol 80mg  (J1040) Depo- Medrol 40mg  (J1030) Admin of Therapeutic Inj  intramuscular or subcutaneous (64332)  Patient Instructions: 1)  sore throat or pharyngitis 2)  acute chronic bronchitis with minmal wheezing 3)  Depomedrol 100 mg IM 4)  tramodol 50 mg four times per day to stop cough 5)  take protonix am AND pm 6)  mUCINEX DM maximum strength am and pm 7)  Levaquin 750 mg  daily for infection 8)  possi bly vaprizer if you dont stop coughing soon 9)  get CBC in lab 10)  increase  fluid intake to be better hydrated Prescriptions: TRAMADOL HCL 50 MG TABS (TRAMADOL HCL) 1 qid to prevent cough  #60 x 3   Entered and Authorized by:   Judithann Sheen MD   Signed by:   Judithann Sheen MD on 01/05/2010   Method used:   Electronically to        [facility] [address] (retail)       [address]       [place], [place]  [address]       Ph: [phone]       Fax: [phone]   RxID:   [id number] LEVAQUIN 750 MG TABS (LEVOFLOXACIN) 1 once daily for infection  #7 x 0   Entered and Authorized by:   Judithann Sheen MD   Signed by:   Judithann Sheen MD on 01/05/2010   Method used:   Print  then Give to Patient   RxID:   [id number] HYDROCODONE-HOMATROPINE 5-1.5 MG/5ML SYRP (HYDROCODONE-HOMATROPINE) 1-2 tsp every  4hrs as needed for cough  #240 cc x 3   Entered and Authorized by:   Judithann Sheen MD   Signed by:   Judithann Sheen MD on 01/05/2010   Method used:   Print then Give to Patient   RxID:   [id number]    Medication Administration  Injection # 1:    Medication: Depo- Medrol 80mg     Diagnosis: COUGH (ICD-786.2)    Route: IM    Site: RUOQ gluteus    Exp Date: 07/2012    Lot #: [id number]    Mfr: Pharm[employer]    Patient tolerated injection without complications    Given by: Pura Spice, RN (January 05, 2010 5:01 PM)  Injection # 2:    Medication:  Depo- Medrol 40mg     Diagnosis: COUGH (ICD-786.2)    Route: IM    Site: RUOQ gluteus    Exp Date: 07/2012    Lot #: [id number]    Mfr: [employer]    Patient tolerated injection without complications    Given by: Pura Spice, RN (January 05, 2010 5:03 PM)  Orders Added: 1)  Venipuncture [44010] 2)  Depo- Medrol 80mg  [J1040] 3)  Depo- Medrol 40mg  [J1030] 4)  Admin of Therapeutic Inj  intramuscular or subcutaneous [96372] 5)  TLB-CBC Platelet - w/Differential [85025-CBCD] 6)  Est. Patient Level IV [27253]

## 2010-12-09 NOTE — Letter (Signed)
Summary: [facility] Instructions  [facility]  [address] [place], [place] [address]   Phone: [phone]  Fax: [phone]       [name]    September 01, 1951    MRN: [id number]        Procedure Day /Date: Tuesday November 29th, 2011     Arrival Time: 12:30pm     Procedure Time: 1:30pm     Location of Procedure:                    _ x_  [facility] (4th Floor)                        PREPARATION FOR COLONOSCOPY WITH MOVIPREP   Starting 5 days prior to your procedure 09/30/10 do not eat nuts, seeds, popcorn, corn, beans, peas,  salads, or any raw vegetables.  Do not take any fiber supplements (e.g. Metamucil, Citrucel, and Benefiber).  THE DAY BEFORE YOUR PROCEDURE         DATE: 10/04/10  DAY: Monday  1.  Drink clear liquids the entire day-NO SOLID FOOD  2.  Do not drink anything colored red or purple.  Avoid juices with pulp.  No orange juice.  3.  Drink at least 64 oz. (8 glasses) of fluid/clear liquids during the day to prevent dehydration and help the prep work efficiently.  CLEAR LIQUIDS INCLUDE: Water Jello Ice Popsicles Tea (sugar ok, no milk/cream) Powdered fruit flavored drinks Coffee (sugar ok, no milk/cream) Gatorade Juice: apple, white grape, white cranberry  Lemonade Clear bullion, consomm, broth Carbonated beverages (any kind) Strained chicken noodle soup Hard Candy                             4.  In the morning, mix first dose of MoviPrep solution:    Empty 1 Pouch A and 1 Pouch B into the disposable container    Add lukewarm drinking water to the top line of the container. Mix to dissolve    Refrigerate (mixed solution should be used within 24 hrs)  5.  Begin drinking the prep at 5:00 p.m. The MoviPrep container is divided by 4 marks.   Every 15 minutes drink the solution down to the next mark (approximately 8 oz) until the full liter is complete.   6.  Follow completed prep with 16 oz of clear liquid of your choice  (Nothing red or purple).  Continue to drink clear liquids until bedtime.  7.  Before going to bed, mix second dose of MoviPrep solution:    Empty 1 Pouch A and 1 Pouch B into the disposable container    Add lukewarm drinking water to the top line of the container. Mix to dissolve    Refrigerate  THE DAY OF YOUR PROCEDURE      DATE: 10/05/10 DAY: Tuesday  Beginning at 8:30 a.m. (5 hours before procedure):         1. Every 15 minutes, drink the solution down to the next mark (approx 8 oz) until the full liter is complete.  2. Follow completed prep with 16 oz. of clear liquid of your choice.    3. You may drink clear liquids until 11:30am (2 HOURS BEFORE PROCEDURE).   MEDICATION INSTRUCTIONS  Unless otherwise instructed, you should take regular prescription medications with a small sip of water   as early as possible the morning of your  procedure.        OTHER INSTRUCTIONS  You will need a responsible adult at least 57 years of age to accompany you and drive you home.   This person must remain in the waiting room during your procedure.  Wear loose fitting clothing that is easily removed.  Leave jewelry and other valuables at home.  However, you may wish to bring a book to read or  an iPod/MP3 player to listen to music as you wait for your procedure to start.  Remove all body piercing jewelry and leave at home.  Total time from sign-in until discharge is approximately 2-3 hours.  You should go home directly after your procedure and rest.  You can resume normal activities the  day after your procedure.  The day of your procedure you should not:   Drive   Make legal decisions   Operate machinery   Drink alcohol   Return to work  You will receive specific instructions about eating, activities and medications before you leave.    The above instructions have been reviewed and explained to me by   Marchelle Folks.     I fully understand and can verbalize these  instructions _____________________________ Date _________

## 2010-12-09 NOTE — Letter (Signed)
Summary: EGD Instructions  [facility]  [address] [place], [place] [address]   Phone: [phone]  Fax: [phone]       [name]    September 23, 1951    MRN: [id number]       Procedure Day /Date: 08-04-10     Arrival Time: 1:00 PM      Procedure Time: 2:00 PM     Location of Procedure:                    X     [facility] (4th Floor)  PREPARATION FOR ENDOSCOPY   On 08-04-10 THE DAY OF THE PROCEDURE: FRIDAY  1.   No solid foods, milk or milk products are allowed after midnight the night before your procedure.  2.   Do not drink anything colored red or purple.  Avoid juices with pulp.  No orange juice.  3.  You may drink clear liquids until 12:00 Noon, which is 2 hours before your procedure.                                                                                                CLEAR LIQUIDS INCLUDE: Water Jello Ice Popsicles Tea (sugar ok, no milk/cream) Powdered fruit flavored drinks Coffee (sugar ok, no milk/cream) Gatorade Juice: apple, white grape, white cranberry  Lemonade Clear bullion, consomm, broth Carbonated beverages (any kind) Strained chicken noodle soup Hard Candy   MEDICATION INSTRUCTIONS  Unless otherwise instructed, you should take regular prescription medications with a small sip of water as early as possible the morning of your procedure.               OTHER INSTRUCTIONS  You will need a responsible adult at least 58 years of age to accompany you and drive you home.   This person must remain in the waiting room during your procedure.  Wear loose fitting clothing that is easily removed.  Leave jewelry and other valuables at home.  However, you may wish to bring a book to read or an iPod/MP3 player to listen to music as you wait for your procedure to start.  Remove all body piercing jewelry and leave at home.  Total time from sign-in until discharge is approximately 2-3 hours.  You should go home  directly after your procedure and rest.  You can resume normal activities the day after your procedure.  The day of your procedure you should not:   Drive   Make legal decisions   Operate machinery   Drink alcohol   Return to work  You will receive specific instructions about eating, activities and medications before you leave.    The above instructions have been reviewed and explained to me by   _______________________    I fully understand and can verbalize these instructions _____________________________ Date _________

## 2010-12-09 NOTE — Letter (Signed)
Summary: [facility]  [facility]   Imported By: Maryln Gottron 06/02/2010 12:55:07  _____________________________________________________________________  External Attachment:    Type:   Image     Comment:   External Document

## 2010-12-09 NOTE — Procedures (Signed)
Summary: Upper Endoscopy  Patient: [name] Note: All result statuses are Final unless otherwise noted.  Tests: (1) Upper Endoscopy (EGD)   EGD Upper Endoscopy       DONE      [facility]     [address]     [place], [place]  [address]           ENDOSCOPY PROCEDURE REPORT     PATIENT:  Kelly, Mcdonald  MR#:  [id number]     BIRTHDATE:  1951/03/14, 58 yrs. old  GENDER:  female     ENDOSCOPIST:  Judie Petit T. Russella Dar, MD, [facility]           PROCEDURE DATE:  08/04/2010     PROCEDURE:  EGD, diagnostic     ASA CLASS:  Class II     INDICATIONS:  GERD     MEDICATIONS:  Fentanyl 50 mcg IV, Versed 6 mg IV     TOPICAL ANESTHETIC:  Exactacain Spray     DESCRIPTION OF PROCEDURE:   After the risks benefits and     alternatives of the procedure were thoroughly explained, informed     consent was obtained.  The LB GIF-H180 [id number] endoscope was     introduced through the mouth and advanced to the second portion of     the duodenum, without limitations.  The instrument was slowly     withdrawn as the mucosa was fully examined.     <<PROCEDUREIMAGES>>     The esophagus and gastroesophageal junction were completely normal     in appearance. The stomach was entered and closely examined. The     pylorus, antrum, angularis, and lesser curvature were well     visualized, including a retroflexed view of the cardia and fundus.     The stomach wall was normally distensable. The scope passed easily     through the pylorus into the duodenum. The duodenal bulb was     normal in appearance, as was the postbulbar duodenum. Retroflexed     views revealed no abnormalities.  The scope was then withdrawn     from the patient and the procedure completed.           COMPLICATIONS:  None           ENDOSCOPIC IMPRESSION:     1) Normal EGD           RECOMMENDATIONS:     1) Anti-reflux regimen     2) PPI bid     3) She might have LPR contributing to hoarseness but there was no     evidence of esophagitis on EGD.  She will need ENT evaluation if     these symptoms persist.           Judie Petit T. Russella Dar, MD, [facility]           CC:  Reuel Derby, MD           n.     Rosalie DoctorVenita Lick. Stark at 08/04/2010 02:26 PM           [name], [id number]  Note: An exclamation mark (!) indicates a result that was not dispersed into the flowsheet. Document Creation Date: 08/04/2010 2:27 PM _______________________________________________________________________  (1) Order result status: Final Collection or observation date-time: 08/04/2010 14:18 Requested date-time:  Receipt date-time:  Reported date-time:  Referring Physician:   Ordering Physician: Claudette Head [phone]) Specimen Source:  Source: Launa Grill Order Number: [id number] Lab site:

## 2011-01-18 ENCOUNTER — Encounter: Payer: Self-pay | Admitting: Family Medicine

## 2011-03-25 NOTE — Op Note (Signed)
NAME:  [name], [name]                             ACCOUNT NO.:  [id number]   MEDICAL RECORD NO.:  [id number]                   PATIENT TYPE:  INP   LOCATION:  NA                                   FACILITY:  [facility]   PHYSICIAN:  Payton Doughty, M.D.                   DATE OF BIRTH:  [birth year]   DATE OF PROCEDURE:  07/05/2002  DATE OF DISCHARGE:                                 OPERATIVE REPORT   PREOPERATIVE DIAGNOSIS:  Herniated disk C4-5.   POSTOPERATIVE DIAGNOSIS:  Herniated disk C4-5.   PROCEDURE:  C4-5 anterior cervical diskectomy and fusion with a tethered  plate.   SURGEON:  Payton Doughty, M.D.   ANESTHESIA:  General endotracheal.   PREP:  Standard preoperative alcohol wipe.   COMPLICATIONS:  None.   ASSISTANT:  Hewitt Shorts, M.D.   INDICATIONS FOR PROCEDURE:  This is a 60 year old right handed white girl  with cervical radiculopathy and a herniated disk at C4-5. She was taken to  the operating room, intubated, placed supine on the operating table.  Following shave, prep and drape in the usual sterile fashion, her skin was  incised in the midline in the medial border of the sternocleidomastoid  muscle one finger breadth above the level of the carotid tubercle. The  platysma was identified and elevated and divided in the midline. The  sternocleidomastoid was identified and medial dissection revealed the  carotid artery tracked laterally to the left. The trachea and esophagus  retracted laterally to the right exposing the bones of the anterior cervical  spine. A mark was placed, intraoperative x-ray obtained to confirm  correctness of level. Having confirmed the correctness of level, we  performed a diskectomy under gross observation. The operating microscope was  then brought in, the disk space spreader placed and microdissection  technique used to dissect the anterior epidural space, remove the disk and  decompress the nerve roots. A herniated disk was found in the  midline  bulging off to the right as well as some to the left. Following complete  decompression of both the thecal sac as well as the nerve roots, the wound  was irrigated. The neuroforamen were carefully explored and found to be  open. A 76 mm bone graft was fashioned with patella allograft and tapped  into place. A 14 mm tethered plate was then placed with 12 mm screws, two in  C4 and two in C5. Intraoperative x-rays showed good placement of bone graft,  plate and screws. The wound was once again irrigated, hemostasis assured.  The platysma was reapproximated with #0 Vicryl in interrupted fashion, subcu  tissue was reapproximated with 3-0 Vicryl interrupted fashion. The skin was  closed with 4-0 Vicryl in running subcuticular fashion. Benzoin and Steri-  Strips were placed _________ with Telfa and OpSite. The patient was then  placed in an Aspen collar,  returned to the recovery room in good condition.                                               Payton Doughty, M.D.    MWR/MEDQ  D:  07/05/2002  T:  07/08/2002  Job:  [id number]

## 2011-03-25 NOTE — Op Note (Signed)
[facility]  Patient:    [name], [name]                        MRN: [id number] Proc. Date: 02/13/01 Adm. Date:  [id number] Disc. Date: [id number] Attending:  Londell Moh                           Operative Report  REDICTATION  PREOPERATIVE DIAGNOSES:  Stress urinary incontinence.  POSTOPERATIVE DIAGNOSES:  Stress urinary incontinence.  PROCEDURE:  Durasphere injection.  SURGEON:  Dr. Logan Bores.  ANESTHESIA:  IV sedation.  COMPLICATIONS:  None.  DRAINS:  None.  BRIEF HISTORY:  This 60 year old female had a sling placed but still had some residual stress incontinence. The patient is felt to have good support for the urethra but does require an injection. The patient is go undergo durasphere injection. The patient understands the risks and benefits of the procedure and gave full and informed consent.  DESCRIPTION OF PROCEDURE:  After adequate IV sedation, the patient was placed in the dorsal lithotomy position, prepped with Betadine and draped in the usual sterile fashion. Cystoscopy was performed and the urethra was felt to be well supported but was somewhat patulous. Durasphere injection was performed transurethrally without any difficulty. The patient received a good clinical response. The bladder was drained. The patient tolerated the procedure well and was taken to the recovery room in good condition. DD:  03/02/01 TD:  03/03/01 Job: [id number]

## 2011-03-25 NOTE — H&P (Signed)
[name], [name]                             ACCOUNT NO.:  [id number]   MEDICAL RECORD NO.:  [id number]                   PATIENT TYPE:  INP   LOCATION:  3009                                 FACILITY:  [facility]   PHYSICIAN:  Payton Doughty, M.D.                   DATE OF BIRTH:  12/08/1950   DATE OF ADMISSION:  07/05/2002  DATE OF DISCHARGE:  07/06/2002                                HISTORY & PHYSICAL   ADMITTING DIAGNOSIS:  Herniated disc C4-5.   BODY OF TEXT:  A 60 year old right-handed white lady off and on has had pain  in her neck intermittently, difficulty with her hands and arms falling  asleep, left worse than right.  She had some back pain several weeks ago and  left lower extremity pain which is resolving.  MRI showed a disc at the  lumbar 4-5 and she had also a MR of her neck which demonstrated a herniated  disc at C4-5 with posterior displacement of the cord and she is now admitted  for an anterior cervical diskectomy and fusion.   MEDICAL HISTORY:  Benign.  She has had a tonsillectomy, D&C, hysterectomy  for carcinoma in situ, two bladder tacks, and a pubovaginal sling.   ALLERGIES:  PENICILLIN.   CURRENT MEDICATIONS:  Celebrex, vitamins and Senokot.   SOCIAL HISTORY:  She does not smoke.  Social drinker.  She is a Diplomatic Services operational officer  for [employer].   FAMILY HISTORY:  Mother died at ____________, she had a stroke and  arthritis.  Dad deceased at 56 from a motor vehicle accident.   REVIEW OF SYSTEMS:  Remarkable for glasses, leg pain while walking,  incontinence, back pain, arm pain, leg pain, arthritis and neck pain.   PHYSICAL EXAMINATION:  HEENT:  Within normal limits.  She has limited range  of motion of her neck.  CHEST:  Clear.  CARDIAC:  Regular rate and rhythm.  ABDOMEN:  Nontender.  No hepatosplenomegaly.  EXTREMITIES:  Without clubbing or cyanosis.  Peripheral pulses are good.  GU:  Deferred.  NEUROLOGIC:  She is awake, alert and oriented.  Cranial nerves are  intact.  Motor exam shows 5/5 strength throughout in the upper and lower extremities.  Sensory deficit described in the left C5 distribution.  Lower extremity  sensation is intact.  Reflexes are 2 at the knees, __________ at the ankles.  Toes are down going bilaterally.  Straight leg raise is negative.  Hoffman's  is positive.   LABORATORY DATA:  MRI of the lumbar spine shows a small right-sided disc at  L2-3, a larger left-sided disc at L4-5.  Cervical MRI shows a disc at C4-5  with effacement of the anterior epidural space and posterior displacement of  the cord.   CLINICAL IMPRESSION:  1. Cervical disc with a left C5 radiculopathy and early myelopathy.  2. Lumbar herniated disc at  with resolving L5 radiculopathy.   Because of the early myelopathy and cord compression she is admitted now for  an anterior cervical diskectomy and fusion.  The risks and benefits of this  procedure have been discussed with her and she wished to proceed.                                               Payton Doughty, M.D.    MWR/MEDQ  D:  07/05/2002  T:  07/08/2002  Job:  [id number]

## 2011-03-25 NOTE — Op Note (Signed)
   NAME:  [name], [name]                             ACCOUNT NO.:  [id number]   MEDICAL RECORD NO.:  [id number]                   PATIENT TYPE:  AMB   LOCATION:  [facility]                                 [facility]ACILITY:  [facility]   PHYSICIAN:  Jamison Neighbor, M.D.               DATE OF BIRTH:  03/03/1951   DATE OF PROCEDURE:  02/28/2003  DATE OF DISCHARGE:                                 OPERATIVE REPORT   PREOPERATIVE DIAGNOSIS:  Stress urinary incontinence (type 3).   POSTOPERATIVE DIAGNOSIS:  Stress urinary incontinence (type 3).   PROCEDURE:  1. Cystoscopy.  2. Durasphere injection.   SURGEON:  Jamison Neighbor, M.D.   ANESTHESIA:  General.   COMPLICATIONS:  None.   DRAINS:  None.   BRIEF HISTORY:  This patient is status post sling a number of years ago and  did well until a couple of years ago when she began to develop some return  stress incontinence.  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  The  patient had a Durasphere injection and did well until the past few months  when her symptoms returned.  She has requested a repeat Durasphere  injection.  She does understand that if this does not work, there are other  options such as the possible use of the upcoming drug Duloxetine, is also a  possibility of doing something like an OB tape transobturator sling.  The  patient gave full and informed consent for the procedure.   DESCRIPTION OF PROCEDURE:  After the successful induction of general  anesthesia, the patient was placed in the dorsal lithotomy position, prepped  with Betadine, and draped in the usual sterile fashion.  Cystoscopy was  performed.  The bladder neck was well-supported but somewhat open and  patulous.  The urethra was noted to be remarkably short in its length.  The  patient had a total of three syringes full of Durasphere injected in a  circumferential fashion with good co-optation in the  urethra.  The patient  tolerated this without difficulty and was taken to the recovery room in good  condition.                                               Jamison Neighbor, M.D.    RJE/MEDQ  D:  02/28/2003  T:  02/28/2003  Job:  [id number]

## 2011-03-25 NOTE — Op Note (Signed)
[name], KIJOWSKI                             ACCOUNT NO.:  [id number]   MEDICAL RECORD NO.:  [id number]                   PATIENT TYPE:  AMB   LOCATION:  DSC                                  FACILITY:  [facility]   PHYSICIAN:  Robert A. Thurston Hole, M.D.              DATE OF BIRTH:  02/12/1951   DATE OF PROCEDURE:  03/23/2004  DATE OF DISCHARGE:                                 OPERATIVE REPORT   PREOPERATIVE DIAGNOSES:  1. Right shoulder adhesive capsulitis.  2. Right shoulder rotator cuff tendinitis.  3. Right shoulder impingement.  4. Right shoulder acromioclavicular joint spurring.   POSTOPERATIVE DIAGNOSIS:  1. Right shoulder adhesive capsulitis.  2. Right shoulder rotator cuff tendinitis.  3. Right shoulder impingement.  4. Right shoulder acromioclavicular joint spurring.   OPERATION PERFORMED:  1. Right shoulder examination under anesthesia followed by manipulation.  2. Right shoulder arthroscopy with lysis of adhesions and subacromial     decompression.  3. Right shoulder distal clavicle excision.   SURGEON:  Elana Alm. Thurston Hole, M.D.   ASSISTANT:  Julien Girt, P.A.   ANESTHESIA:  General.   OPERATIVE TIME:  45 minutes.   COMPLICATIONS:  None.   INDICATIONS FOR PROCEDURE:  [name] is a 60 year old woman who has had  significant right shoulder pain for the past six to eight months increasing  in nature with exam and MRI documenting rotator cuff tendinitis, adhesive  capsulitis, impingement and acromioclavicular joint spurring who has failed  conservative care and is now to undergo arthroscopy and manipulation and  debridement.   DESCRIPTION OF PROCEDURE:  The patient was brought to the operating room on  Mar 23, 2004 after a block had been placed in the holding room by  anesthesia.  She was placed on the operating table in supine position.  After being placed under general anesthesia, initial range of motion was  tested.  She had forward flexion to 150, abduction  to 120, internal and  external rotation of 50 degrees.  A gentle manipulation was carried out,  breaking up adhesions and improving forward flexion to 175, abduction to  175, internal and external rotation of 85 degrees.  The shoulder remained  stable to the ligamentous exam.  She was then placed in beach chair position  and her shoulder and arm was prepped using sterile DuraPrep and draped using  sterile technique.  Originally through a posterior arthroscopic portal, the  arthroscope with a pump attached was placed and through an anterior portal,  an arthroscopic probe was placed.  Initial inspection showed a moderate  hemarthrosis consistent with her manipulation, then this was evacuated.  The  articular cartilage was intact.  The anterior labrum intact.  The superior  labrum, biceps tendon anchor intact. Posterior labrum showed moderate  fraying 25% which was debrided.  Anterior and inferior labrum and  glenohumeral ligament complex was intact.  Inferior capsular recess showed  diffuse hemorrhagic synovitis, otherwise it was intact.  Biceps tendon was  intact.  Rotator cuff showed no evidence of a tear but there was diffuse  hemorrhagic synovitis which was partially cauterized to the anterior and  superior part of the joint.  After this was done, the subacromial space was  entered and a lateral arthroscopic portal was made.  Moderately thickened  bursitis was resected.  The rotator cuff was somewhat frayed and inflamed  down on the bursal surface and this was debrided.  Impingement was noted and  subacromial decompression was carried out as well as CA ligament release.  The acromioclavicular joint showed significant spurring and degenerative  changes and the distal 5 to 6 mm of the clavicle was resected with a 6 mm  bur.  After this was done, the shoulder could be brought through a full  range of motion with no impingement on the rotator cuff.  At this point it  was felt that all  pathology had been satisfactorily addressed.  The  instruments were removed.  Portals were closed with 3-0 nylon suture,  sterile dressings and a sling applied.  The patient was awakened and taken  to recovery in stable condition.   FOLLOW UP:  [name] will be followed as an outpatient on Percocet and  Naprosyn with early aggressive physical therapy.  See her back in the office  in a week for suture removal and follow-up.                                               Robert A. Thurston Hole, M.D.    RAW/MEDQ  D:  03/23/2004  T:  03/24/2004  Job:  [id number]

## 2011-03-25 NOTE — Op Note (Signed)
[name], [name]                   ACCOUNT NO.:  [id number]   MEDICAL RECORD NO.:  [id number]          PATIENT TYPE:  AMB   LOCATION:  DAY                          FACILITY:  [facility]   PHYSICIAN:  Gretta Cool, M.D. 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   PROCEDURES:  1.  Posterior and enterocele repairs.  2.  Cardinal-uterosacral colposuspension.  3.  Repair of levator diastasis.  4.  Perineal body repair.   SURGEON:  Gretta Cool, M.D.   ASSISTANT:  Almedia Balls. Randell Patient, M.D.   ANESTHESIA:  General orotracheal.   DESCRIPTION OF PROCEDURE:  Under excellent general anesthesia with the  patient prepped and draped in Allen stirrups in lithotomy position, the  posterior vaginal mucosa was grasped at the introitus and a V-shaped  incision made.  The mucosa was then undermined to the apex of the vagina.  The severe fascial detachment was then identified and cardinal-uterosacral  ligaments were then grasped with Allis clamps as far back as possible and as  high as possible.  A cardinal-uterosacral colposuspension was then performed  securing the uterosacral ligament complex to the detached posterior rectal  fascia, perirectal fascia.  The fascia was pulled all the way to the apex of  the vagina.  The fascia was then secured to the anterior vaginal fascia  across the midline with mattress sutures of 2-0 Novofil.  Once the  perirectal fascial detachment had been completely repaired, a plication of  the fascia was undertaken from the apex of the vagina to the introitus.  The  mucosa was then trimmed and the upper  layers of endopelvic fascia and  mucosa, the vagina closed with a running subcuticular closure of 2-0 Vicryl.  When the vagina was repaired to the level of the perineal body, the perineal  body muscles were repaired by interrupted sutures of 2-0 Vicryl.  The  mucosa was then closed with a running suture of 2-0 Vicryl all the way to  the introitus.  At this point the procedure was terminated without  complication.  The caliber of the vagina remained adequate by digital with  good depth.  There were no complications.  The patient returned to the  recovery room in excellent condition.       CWL/MEDQ  D:  06/01/2005  T:  06/01/2005  Job:  [id number]   cc:   Valetta Mole. Swords, M.D. [facility]

## 2011-03-25 NOTE — H&P (Signed)
[name], [name]                   ACCOUNT NO.:  [id number]   MEDICAL RECORD NO.:  [id number]          PATIENT TYPE:  AMB   LOCATION:  DAY                          FACILITY:  [facility]   PHYSICIAN:  Gretta Cool, M.D. DATE OF BIRTH:  02-26-1951   DATE OF ADMISSION:  06/01/2005  DATE OF DISCHARGE:                                HISTORY & PHYSICAL   CHIEF COMPLAINT:  Pelvic support problems.   HISTORY OF PRESENT ILLNESS:  [name] is a 60 year old gravida 1, para 1  under the primary care of Dr.  Birdie Sons. She has a severe problem of  pelvic organ prolapse with enormous enterocele, rectocele and severe fascial  detachment. She has levator diastasis with loss of perineal body  musculature. She also has severe urge incontinence. She has had two previous  sling procedures by Dr. Logan Bores neither one of which produced significant  improvement. She was using Ditropan without really significant improvement  in her incontinence. She had had a previous anterior repair elsewhere when  she had her hysterectomy. She now has extreme posterior pelvic support  prolapse and vault prolapse. She was admitted for definitive therapy by  posterior enterocele repairs, site specific fascial defect repair,  uterosacral cardinal colposuspension and perineal body repair.   PAST MEDICAL HISTORY:  Usual childhood disease without sequelae.   MEDICAL ILLNESSES:  History of bladder surgery and hysterectomy in '87, Dr.  Elana Alm, bladder repair x2 by pubovaginal sling, Dr. Logan Bores, neck surgery by  Dr. Channing Mutters, shoulder manipulation by Dr. Wyline Mood. Medical problems also include  hiatal hernia.   PRESENT MEDICATIONS:  Vivelle dot 0.05, Premarin vaginal cream, Ditropan XL  daily, Protonix daily.   FAMILY HISTORY:  Father died auto accident at age 64, mom died of stroke at  21, also had breast cancer in her late 8s. One sister has fibromyalgia,  two brothers have heart problems, one brother also had a stroke, age 73  still living.   REVIEW OF SYSTEMS:  HEENT:  Denies symptoms. CARDIORESPIRATORY:  Denies  asthma, cough, bronchitis, shortness of breath. GI:  Severe urge  incontinence and severe urgency to void on medications as above with two  previous surgeries as above. Denies other complaints.   PHYSICAL EXAM:  GENERAL:  Well-developed, well-nourished white female rather  massively over ideal weight at 228. Height 5 feet 8 inches, blood pressure  110/70.  HEENT:  Pupils equal, reactive to light and accommodation, fundi not  examined. Oropharynx clear.  NECK:  Supple. without mass or thyroid enlargement.  CHEST:  Clear  P to A.  BREASTS:  Without mass, nodes, or nipple discharge.  HEART:  Regular rhythm without murmur or cardiac enlargement.  ABDOMEN:  Large panniculus without palpable mass or organomegaly. PELVIC:  External genitalia normal female, vagina widened genital hiatus with  posterior displacement of her rectum. She has an enormous enterocele and  rectocele with severe fascial detachment. The fascia slipped to the lower  1/3 of her vagina. Her anterior compartment has weakness but I did not see  significant compartment fascial defect. She has loss of  much of the perineal  body musculature with levator diastasis, rectovaginal exam confirms. Note  her vaginal apex extends to and through the introitus with straining.  EXTREMITIES:  Negative.  NEUROLOGIC:  Physiologic.   IMPRESSION:  1.  Pelvic organ prolapse with enormous enterocele and rectocele.  2.  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  3.  Levator diastasis,  4.  Loss of perineal body musculature.  5.  Severe urge incontinence refractory to therapy as above.   PLAN:  Posterior enterocele repairs, uterosacral cardinal colposuspension,  perineal body repair, repair of levator diastasis, site specific repair.       CWL/MEDQ  D:  06/01/2005  T:  06/01/2005  Job:  [id number]   cc:   Valetta Mole. Swords,  M.D. [facility]

## 2011-03-25 NOTE — Op Note (Signed)
[facility]  Patient:    [name], [name]                        MRN: [id number] Proc. Date: 11/28/00 Adm. Date:  [id number] Attending:  Londell Moh CC:         Feliciana Rossetti, M.D.   Operative Report  PREOPERATIVE DIAGNOSIS:  Recurrent stress urinary incontinence, status post urethropexy nine years ago.  POSTOPERATIVE DIAGNOSIS:  Recurrent stress urinary incontinence, status post urethropexy nine years ago.  OPERATION:  Cystoscopy, suprapubic tube placement, and pubovaginal sling.  SURGEON:  Jamison Neighbor, M.D.  ANESTHESIA:  General.  COMPLICATIONS:  None.  DRAINS:  Banana Cystocath.  BRIEF HISTORY:  The patient is status post pubovaginal sling in 1993.  She did well for many years until she recently developed some recurrence of what was first felt by her primary care physician to be urgency incontinence.  She was treated with anticholinergics with no improvement.  The patient was found to have classic stress incontinence, and a urodynamic evaluation confirmed this with a low leak pump pressure noted.  The patient was appraised as of the various treatment options with discussions that included laparoscopic approach, abdominal approach, as well as the pubovaginal sling, and the tension-free vaginal tape.  The patient is now to undergo placement of pubovaginal sling.  She understands the risks and benefits of the procedure and gave full and informed consent.  DESCRIPTION OF PROCEDURE:  After the successful induction of general anesthesia, the patient was placed in a low lithotomy position and prepped with Betadine and draped in the usual sterile fashion.  The labia was sutured out to the medial thigh to afford better exposure.  A weighed speculum was placed.  The patient was noted to not have significant cystocele.  A small but asymptomatic rectocele was noted.  There was no obvious enterocele.  The anterior vaginal mucosa was  infiltrated with local anesthetic.  While that was setting up, the old incision was opened in the midline directly above the pubic bone.  The old sutures were not detected and ______ pulled out.  An anterior vaginal mucosal incision was then made, beginning at the major urethra and extending back posteriorly.  Props were raised bilaterally allowing entry into the space of Retzius.  The space of Retzius was entered, and an adequate space was developed for passage of the sling.  The sling was then constructed of a 2 x 7 piece of fascia with the ends very slightly rolled over and then oversewn with a #1 nylon.  The sling was then passed from the vaginal incision into the abdominal incision by passing a tonsil clamp on each side.  This was used to pull up the suture.  The sling was then tacked down with 2-0 Vicryl to prevent bunching.  The anterior vaginal mucosa was closed with a running suture of 2-0 Vicryl.  The weighted vaginal speculum and the sutures were taken down.  The cystoscope was inserted, and the bladder was carefully inspected.  It was free of any tumor or stones.  Both ureteral orifices were normal in configuration and location with no significant cystocele noted.  There was no evidence of any suture material or sling material anywhere within the bladder.  Clear urine was seen to efflux from each ureter.  Four things were done to ensure that there was no overcorrection.  The sling was tied with what was felt to be an appropriate degree  of tension.  Two fingerbreadths could still be placed beneath the abdominal sutures.  The cystoscope would enter at a straight angle with no excessive angulation.  There was 30-45 degrees of free play.  The mucosa coapted normally without angulation.  With a full bladder, there was no leakage, but with a cord aiming, there was a straight flow.  Under direct vision, a banana Cystocath was passed and sutured in place with 3-0 nylon. After tying down  the sling with the appropriate degree of tension, as described above, the area was irrigated and closed with 2-0 Vicryl and surgical clips.  The patient tolerated the procedure well and was taken to the recovery room in good condition. DD:  11/28/00 TD:  11/28/00 Job: [id number]

## 2011-04-14 ENCOUNTER — Other Ambulatory Visit (INDEPENDENT_AMBULATORY_CARE_PROVIDER_SITE_OTHER): Payer: Federal, State, Local not specified - PPO

## 2011-04-14 DIAGNOSIS — Z0000 Encounter for general adult medical examination without abnormal findings: Secondary | ICD-10-CM

## 2011-04-14 LAB — CBC WITH DIFFERENTIAL/PLATELET
Basophils Absolute: 0 10*3/uL (ref 0.0–0.1)
Eosinophils Relative: 3.2 % (ref 0.0–5.0)
Hemoglobin: 13.3 g/dL (ref 12.0–15.0)
Lymphocytes Relative: 38.3 % (ref 12.0–46.0)
Monocytes Relative: 5.8 % (ref 3.0–12.0)
Neutro Abs: 4 10*3/uL (ref 1.4–7.7)
Platelets: 216 10*3/uL (ref 150.0–400.0)
RDW: 12.6 % (ref 11.5–14.6)
WBC: 7.7 10*3/uL (ref 4.5–10.5)

## 2011-04-14 LAB — BASIC METABOLIC PANEL
Calcium: 8.6 mg/dL (ref 8.4–10.5)
GFR: 70.27 mL/min (ref >60.00)
Glucose, Bld: 95 mg/dL (ref 70–99)
Sodium: 140 mEq/L (ref 135–145)

## 2011-04-14 LAB — URINALYSIS
Bilirubin Urine: NEGATIVE
Hgb urine dipstick: NEGATIVE
Ketones, ur: NEGATIVE
Urine Glucose: NEGATIVE
Urobilinogen, UA: 0.2 (ref 0.0–1.0)

## 2011-04-14 LAB — HEPATIC FUNCTION PANEL
ALT: 22 U/L (ref 0–35)
Total Protein: 7 g/dL (ref 6.0–8.3)

## 2011-04-14 LAB — LIPID PANEL
HDL: 48.4 mg/dL (ref >39.00)
Total CHOL/HDL Ratio: 5
Triglycerides: 99 mg/dL (ref 0.0–149.0)
VLDL: 19.8 mg/dL (ref 0.0–40.0)

## 2011-04-15 LAB — LDL CHOLESTEROL, DIRECT: Direct LDL: 148.1 mg/dL

## 2011-04-26 ENCOUNTER — Ambulatory Visit (INDEPENDENT_AMBULATORY_CARE_PROVIDER_SITE_OTHER): Payer: Federal, State, Local not specified - PPO | Admitting: Family Medicine

## 2011-04-26 DIAGNOSIS — E6609 Other obesity due to excess calories: Secondary | ICD-10-CM

## 2011-04-26 DIAGNOSIS — Z0000 Encounter for general adult medical examination without abnormal findings: Secondary | ICD-10-CM

## 2011-04-26 DIAGNOSIS — E669 Obesity, unspecified: Secondary | ICD-10-CM

## 2011-04-26 DIAGNOSIS — K219 Gastro-esophageal reflux disease without esophagitis: Secondary | ICD-10-CM

## 2011-04-26 DIAGNOSIS — M1710 Unilateral primary osteoarthritis, unspecified knee: Secondary | ICD-10-CM

## 2011-04-26 DIAGNOSIS — N952 Postmenopausal atrophic vaginitis: Secondary | ICD-10-CM

## 2011-04-26 MED ORDER — NITROFURANTOIN MONOHYD MACRO 100 MG PO CAPS: 100.0000 mg | ORAL_CAPSULE | Freq: Every day | ORAL | Status: AC

## 2011-04-26 MED ORDER — HYDROCODONE-ACETAMINOPHEN 5-500 MG PO TABS: ORAL_TABLET | ORAL | Status: DC

## 2011-04-26 MED ORDER — NABUMETONE 750 MG PO TABS: 750.0000 mg | ORAL_TABLET | Freq: Two times a day (BID) | ORAL | Status: DC | PRN

## 2011-04-26 MED ORDER — DARIFENACIN HYDROBROMIDE ER 15 MG PO TB24: 15.0000 mg | ORAL_TABLET | Freq: Every day | ORAL | Status: DC

## 2011-04-26 MED ORDER — PANTOPRAZOLE SODIUM 40 MG PO TBEC: 40.0000 mg | DELAYED_RELEASE_TABLET | Freq: Every day | ORAL | Status: DC

## 2011-04-26 NOTE — Patient Instructions (Addendum)
You are really doing fine, I am going to get Jimmy to walk with you on a regular basis 15-30 minutes 3-5 times per week or more if you desire, also recommend weight watchers Low cholesterol diet, recheck lipids in 3 months, please schedule Continue other regular medications

## 2011-06-04 ENCOUNTER — Encounter: Payer: Self-pay | Admitting: Family Medicine

## 2011-06-04 NOTE — Progress Notes (Signed)
  Subjective:    Patient ID: [name], female    DOB: [birth year], 60 y.o.   MRN: [id number] This 60 year old white married female is in today for complete physical examination which he has on [place] basis. Her gynecological examination is done by Dr. Hyacinth Meeker gynecologist Patient complaint today is that she has felt more fatigued over the past 6 months and prior to this time cervical spine surgery and was having pain in the left arm and occasionally has it at the present time but not in severe as previously. She does physical therapy 2 times per week he continues to have atrophic vaginitis to a milder degree since she had been using Premarin vaginal cream and has continued this treatment she also continues to use testosterone cream on the vulva and inner thigh to increase her sex drive. She relates she is having a difficult time losing weight and continues to be overweight continues to have overactive bladder for which he takes Enablex 15 mg each day. Has had GERD for of good long time but is under control at the present time had upper endoscopy and colonoscopic examination December 2011 with negative results. She complains also of pain in her knees from arthritis and is on an NSAID HPI    Review of Systems see history of present illness, system review otherwise is negative     Objective:   Physical Exam the patient is a well-built well-nourished white female who is obese but not in distress . Patient is for a pleasant and cooperative HEENT ears nose and throat negative carotid pulses are good oral pharynx negative thyroid is normal Lungs are clear to palpation percussion and auscultation no rales heard no dullness no wheezing heart examination is within normal in its no cardiomegaly heart sounds are good without murmurs regular rhythm peripheral pulses are good and equal bilaterally Breast pendulous no masses palpable nipples normal axilla clear Abdomen liver spleen kidneys are nonpalpable no  masses were felt bowel sounds normal aorta percusses normal Pelvic and rectal done by gynecologist Extremities no edema however tender both knees, no limitation in movement, hips normal examination Spine continues to have tenderness of the left lateral cervical spine lumbosacral spine negative Skin note positive findings        Assessment & Plan:  Exogenous obesity recommended weight watcher's Atrophic vaginitis continue Premarin vaginal cream Cervical arthritis recommend continuing Relafen 750 mg twice a day use Vicodin when necessary GERD much improved Overactive bladder continue Enablex 15 mg daily Physical examination in general reveals a healthy female with controllable problems

## 2011-08-01 ENCOUNTER — Ambulatory Visit (INDEPENDENT_AMBULATORY_CARE_PROVIDER_SITE_OTHER): Payer: Federal, State, Local not specified - PPO | Admitting: Family Medicine

## 2011-08-01 ENCOUNTER — Encounter: Payer: Self-pay | Admitting: Family Medicine

## 2011-08-01 VITALS — BP 120/78 | HR 83 | Temp 98.4°F | Wt 231.0 lb

## 2011-08-01 DIAGNOSIS — M545 Low back pain, unspecified: Secondary | ICD-10-CM

## 2011-08-01 MED ORDER — CYCLOBENZAPRINE HCL 10 MG PO TABS: 10.0000 mg | ORAL_TABLET | Freq: Three times a day (TID) | ORAL | Status: AC | PRN

## 2011-08-01 MED ORDER — METHYLPREDNISOLONE ACETATE 80 MG/ML IJ SUSP
120.0000 mg | Freq: Once | INTRAMUSCULAR | Status: AC
  Administered 2011-08-01: 120 mg via INTRAMUSCULAR

## 2011-08-01 NOTE — Progress Notes (Signed)
  Subjective:    Patient ID: [name], female    DOB: 06-26-1951, 60 y.o.   MRN: [id number]  HPI Here for the sudden onset of sharp pains in the lower back early this morning as she was attempting to sit down in a chair. The pain does not radiate into the legs. No numbness or weakness in the legs. On Nabumetone.    Review of Systems  Constitutional: Negative.   Musculoskeletal: Positive for back pain.       Objective:   Physical Exam  Constitutional: She appears well-developed and well-nourished.  Musculoskeletal:       Tender in the lower back, full ROM           Assessment & Plan:  Rest, moist heat

## 2011-08-01 NOTE — Progress Notes (Signed)
Addended by: Aniceto Boss A on: 08/01/2011 04:11 PM   Modules accepted: Orders

## 2011-08-02 ENCOUNTER — Other Ambulatory Visit: Payer: Federal, State, Local not specified - PPO

## 2011-08-04 ENCOUNTER — Other Ambulatory Visit: Payer: Federal, State, Local not specified - PPO

## 2011-08-05 ENCOUNTER — Other Ambulatory Visit (INDEPENDENT_AMBULATORY_CARE_PROVIDER_SITE_OTHER): Payer: Federal, State, Local not specified - PPO

## 2011-08-05 DIAGNOSIS — E785 Hyperlipidemia, unspecified: Secondary | ICD-10-CM

## 2011-08-05 LAB — LIPID PANEL: Total CHOL/HDL Ratio: 4

## 2011-10-26 LAB — HM MAMMOGRAPHY

## 2012-01-06 ENCOUNTER — Encounter: Payer: Self-pay | Admitting: Family Medicine

## 2012-02-11 ENCOUNTER — Encounter: Payer: Self-pay | Admitting: Internal Medicine

## 2012-02-11 DIAGNOSIS — M5090 Cervical disc disorder, unspecified, unspecified cervical region: Secondary | ICD-10-CM

## 2012-02-11 DIAGNOSIS — E669 Obesity, unspecified: Secondary | ICD-10-CM | POA: Insufficient documentation

## 2012-02-11 DIAGNOSIS — M545 Low back pain: Secondary | ICD-10-CM | POA: Insufficient documentation

## 2012-02-11 DIAGNOSIS — K219 Gastro-esophageal reflux disease without esophagitis: Secondary | ICD-10-CM | POA: Insufficient documentation

## 2012-02-11 DIAGNOSIS — Z0000 Encounter for general adult medical examination without abnormal findings: Secondary | ICD-10-CM | POA: Insufficient documentation

## 2012-02-11 DIAGNOSIS — G8929 Other chronic pain: Secondary | ICD-10-CM | POA: Insufficient documentation

## 2012-02-11 DIAGNOSIS — M169 Osteoarthritis of hip, unspecified: Secondary | ICD-10-CM

## 2012-02-11 DIAGNOSIS — J309 Allergic rhinitis, unspecified: Secondary | ICD-10-CM | POA: Insufficient documentation

## 2012-02-11 DIAGNOSIS — K5790 Diverticulosis of intestine, part unspecified, without perforation or abscess without bleeding: Secondary | ICD-10-CM

## 2012-02-11 DIAGNOSIS — C539 Malignant neoplasm of cervix uteri, unspecified: Secondary | ICD-10-CM | POA: Insufficient documentation

## 2012-02-11 DIAGNOSIS — M069 Rheumatoid arthritis, unspecified: Secondary | ICD-10-CM | POA: Insufficient documentation

## 2012-02-11 HISTORY — DX: Cervical disc disorder, unspecified, unspecified cervical region: M50.90

## 2012-02-11 HISTORY — DX: Osteoarthritis of hip, unspecified: M16.9

## 2012-02-11 HISTORY — DX: Diverticulosis of intestine, part unspecified, without perforation or abscess without bleeding: K57.90

## 2012-02-13 ENCOUNTER — Ambulatory Visit (INDEPENDENT_AMBULATORY_CARE_PROVIDER_SITE_OTHER): Payer: Federal, State, Local not specified - PPO | Admitting: Internal Medicine

## 2012-02-13 ENCOUNTER — Encounter: Payer: Self-pay | Admitting: Internal Medicine

## 2012-02-13 VITALS — BP 112/70 | HR 69 | Temp 97.5°F | Ht 68.0 in | Wt 233.0 lb

## 2012-02-13 DIAGNOSIS — R7302 Impaired glucose tolerance (oral): Secondary | ICD-10-CM

## 2012-02-13 DIAGNOSIS — R7309 Other abnormal glucose: Secondary | ICD-10-CM

## 2012-02-13 DIAGNOSIS — E785 Hyperlipidemia, unspecified: Secondary | ICD-10-CM

## 2012-02-13 DIAGNOSIS — Z0000 Encounter for general adult medical examination without abnormal findings: Secondary | ICD-10-CM

## 2012-02-13 DIAGNOSIS — J309 Allergic rhinitis, unspecified: Secondary | ICD-10-CM

## 2012-02-13 HISTORY — DX: Hyperlipidemia, unspecified: E78.5

## 2012-02-13 NOTE — Assessment & Plan Note (Signed)
Mild to mod elev last visit - for lower chol diet, declines statin for now,  to f/u any worsening symptoms or concerns

## 2012-02-13 NOTE — Progress Notes (Signed)
Subjective:    Patient ID: [name], female    DOB: 19-Dec-1950, 61 y.o.   MRN: [id number]  HPI  Here to establish as new pt in transfer from Dr Scotty Court, now retired.  Overall doing well.  Does have several wks ongoing nasal allergy symptoms with clear congestion, itch and sneeze, without fever, pain, ST, cough or wheezing.  Pt denies new neurological symptoms such as new headache, or facial or extremity weakness or numbness  Pt denies chest pain, increased sob or doe, wheezing, orthopnea, PND, increased LE swelling, palpitations, dizziness or syncope.   Pt denies polydipsia, polyuria, has had no signficant persist elev BS in the past.  Struggling to lose wt, not easy.  Has end of one job later this month, with new job starting may 2013, similar position. Not overly stressed by this Past Medical History  Diagnosis Date  . GERD (gastroesophageal reflux disease)   . Arthritis   . Diverticulosis 02/11/2012  . OVERACTIVE BLADDER 08/10/2007    Qualifier: Diagnosis of  By: Linna Darner, CMA, Cindy    . GERD 08/10/2007    Qualifier: Diagnosis of  By: Linna Darner, CMA, Cindy    . ARTHRITIS, KNEE 02/10/2009    Qualifier: Diagnosis of  By: Alphonzo Severance MD, Lance Muss, ATROPHIC 11/11/2008    Qualifier: Diagnosis of  By: Alphonzo Severance MD, Loni Dolly HEMORRHOIDS-INTERNAL 07/21/2010    Qualifier: Diagnosis of  By: Wilmon Pali NP, Gunnar Fusi    . Cervical cancer   . Allergic rhinitis, cause unspecified   . Chronic LBP   . Obesity   . Degenerative arthritis of hip 02/11/2012  . Cervical disc disease 02/11/2012  . Hyperlipidemia 02/13/2012   Past Surgical History  Procedure Date  . Neck surgery   . Abdominal hysterectomy   . Spine surgery   . Mmk / anterior vesicourethropexy / urethropexy   . Foot surgury   . Shoulder surgury   . Tonsillectomy   . Vaginal surgury   . Cystocele repair   . Rectocele repair     reports that she has never smoked. She has never used smokeless tobacco. She reports that she drinks  alcohol. She reports that she does not use illicit drugs. family history includes Cancer in her mother and sister; Diabetes in her brother, maternal aunt, and maternal uncle; and Heart disease in her brother and sister. Allergies  Allergen Reactions  . Amoxicillin     REACTION: unspecified  . Penicillins    Current Outpatient Prescriptions on File Prior to Visit  Medication Sig Dispense Refill  . aspirin 81 MG tablet Take 81 mg by mouth daily.        . cholecalciferol (VITAMIN D) 1000 UNITS tablet Take 1,000 Units by mouth daily.        . cyclobenzaprine (FLEXERIL) 10 MG tablet Take 1 tablet (10 mg total) by mouth every 8 (eight) hours as needed for muscle spasms.  60 tablet  5  . darifenacin (ENABLEX) 15 MG 24 hr tablet Take 1 tablet (15 mg total) by mouth daily.  30 tablet  11  . fish oil-omega-3 fatty acids 1000 MG capsule Take 2 g by mouth daily.        [name] Kitchen HYDROcodone-acetaminophen (VICODIN) 5-500 MG per tablet 1-2 q4h prn pain  100 tablet  5  . nabumetone (RELAFEN) 750 MG tablet Take 1 tablet (750 mg total) by mouth 2 (two) times daily as needed. For arthritis  60 tablet  11  .  nitrofurantoin, macrocrystal-monohydrate, (MACROBID) 100 MG capsule Take 1 capsule (100 mg total) by mouth daily.  30 capsule  11  . pantoprazole (PROTONIX) 40 MG tablet Take 1 tablet (40 mg total) by mouth daily.  30 tablet  11  . polyethylene glycol (MIRALAX / GLYCOLAX) packet Take 17 g by mouth daily as needed.        [name] Kitchen VIVELLE-DOT 0.075 MG/24HR 1 patch 2 (two) times a week.       [name] Kitchen DISCONTD: estradiol (VIVELLE-DOT) 0.1 MG/24HR Place 1 patch onto the skin 2 (two) times a week.         Review of Systems Review of Systems  Constitutional: Negative for diaphoresis and unexpected weight change.  Eyes: Negative for photophobia and visual disturbance.  Respiratory: Negative for choking and stridor.   Gastrointestinal: Negative for vomiting and blood in stool.  Genitourinary: Negative for hematuria and decreased  urine volume.  Musculoskeletal: Negative for gait problem.  Objective:   Physical Exam BP 112/70  Pulse 69  Temp(Src) 97.5 F (36.4 C) (Oral)  Ht 5\' 8"  (1.727 m)  Wt 233 lb (105.688 kg)  BMI 35.43 kg/m2  SpO2 95% Physical Exam  VS noted Constitutional: Pt appears well-developed and well-nourished.  HENT: Head: Normocephalic.  Right Ear: External ear normal.  Left Ear: External ear normal.  Bilat tm's mild erythema.  Sinus nontender.  Pharynx mild erythema Eyes: Conjunctivae and EOM are normal. Pupils are equal, round, and reactive to light.  Neck: Normal range of motion. Neck supple.  Cardiovascular: Normal rate and regular rhythm.   Pulmonary/Chest: Effort normal and breath sounds normal.  Neurological: Pt is alert. Motor/gait intact  Skin: Skin is warm. No erythema.  Psychiatric: Pt behavior is normal. Thought content normal. not nervous or depressed affect    Assessment & Plan:

## 2012-02-13 NOTE — Patient Instructions (Addendum)
Please keep your appointments with your specialists as you have planned - GYN Continue all other medications as before Please follow lower cholesterol diet Please return in 3 mo with Lab testing done 3-5 days before

## 2012-02-13 NOTE — Assessment & Plan Note (Signed)
Mild , for allegra prn otc course,  to f/u any worsening symptoms or concerns

## 2012-02-13 NOTE — Assessment & Plan Note (Signed)
Asympt, stable overall by hx and exam, , and pt to continue medical treatment as before - to cont efforts at diet, lower calories, wt loss

## 2012-04-29 ENCOUNTER — Other Ambulatory Visit: Payer: Self-pay | Admitting: Family Medicine

## 2012-05-01 NOTE — Telephone Encounter (Signed)
Done erx 

## 2012-05-08 ENCOUNTER — Other Ambulatory Visit (INDEPENDENT_AMBULATORY_CARE_PROVIDER_SITE_OTHER): Payer: Federal, State, Local not specified - PPO

## 2012-05-08 DIAGNOSIS — Z0000 Encounter for general adult medical examination without abnormal findings: Secondary | ICD-10-CM

## 2012-05-08 DIAGNOSIS — R7309 Other abnormal glucose: Secondary | ICD-10-CM

## 2012-05-08 DIAGNOSIS — R7302 Impaired glucose tolerance (oral): Secondary | ICD-10-CM

## 2012-05-08 LAB — URINALYSIS, ROUTINE W REFLEX MICROSCOPIC
Ketones, ur: NEGATIVE
Specific Gravity, Urine: 1.01 (ref 1.000–1.030)
Total Protein, Urine: NEGATIVE
Urine Glucose: NEGATIVE

## 2012-05-08 LAB — LIPID PANEL: VLDL: 15.8 mg/dL (ref 0.0–40.0)

## 2012-05-08 LAB — HEPATIC FUNCTION PANEL
ALT: 15 U/L (ref 0–35)
AST: 17 U/L (ref 0–37)
Alkaline Phosphatase: 77 U/L (ref 39–117)
Bilirubin, Direct: 0.1 mg/dL (ref 0.0–0.3)
Total Bilirubin: 0.7 mg/dL (ref 0.3–1.2)

## 2012-05-08 LAB — CBC WITH DIFFERENTIAL/PLATELET
Eosinophils Relative: 2.6 % (ref 0.0–5.0)
Lymphocytes Relative: 36.8 % (ref 12.0–46.0)
Monocytes Relative: 5.2 % (ref 3.0–12.0)
Neutrophils Relative %: 55 % (ref 43.0–77.0)
Platelets: 213 10*3/uL (ref 150.0–400.0)
RBC: 4.46 Mil/uL (ref 3.87–5.11)
WBC: 8.4 10*3/uL (ref 4.5–10.5)

## 2012-05-08 LAB — BASIC METABOLIC PANEL
BUN: 19 mg/dL (ref 6–23)
Calcium: 9.2 mg/dL (ref 8.4–10.5)
Creatinine, Ser: 0.8 mg/dL (ref 0.4–1.2)
GFR: 82.91 mL/min (ref >60.00)
Glucose, Bld: 95 mg/dL (ref 70–99)
Potassium: 3.7 mEq/L (ref 3.5–5.1)

## 2012-05-08 LAB — TSH: TSH: 1.97 u[IU]/mL (ref 0.35–5.50)

## 2012-05-15 ENCOUNTER — Ambulatory Visit (INDEPENDENT_AMBULATORY_CARE_PROVIDER_SITE_OTHER): Payer: Federal, State, Local not specified - PPO | Admitting: Internal Medicine

## 2012-05-15 ENCOUNTER — Encounter: Payer: Self-pay | Admitting: Internal Medicine

## 2012-05-15 VITALS — BP 110/82 | HR 60 | Temp 97.1°F | Ht 67.5 in | Wt 233.1 lb

## 2012-05-15 DIAGNOSIS — E785 Hyperlipidemia, unspecified: Secondary | ICD-10-CM

## 2012-05-15 DIAGNOSIS — Z0000 Encounter for general adult medical examination without abnormal findings: Secondary | ICD-10-CM

## 2012-05-15 MED ORDER — PANTOPRAZOLE SODIUM 40 MG PO TBEC: 40.0000 mg | DELAYED_RELEASE_TABLET | Freq: Every day | ORAL | Status: DC

## 2012-05-15 MED ORDER — HYDROCODONE-ACETAMINOPHEN 5-500 MG PO TABS: ORAL_TABLET | ORAL | Status: DC

## 2012-05-15 MED ORDER — DARIFENACIN HYDROBROMIDE ER 15 MG PO TB24: 15.0000 mg | ORAL_TABLET | Freq: Every day | ORAL | Status: DC

## 2012-05-15 NOTE — Patient Instructions (Addendum)
Continue all other medications as before Your refills were done as requested today Please have the pharmacy call with any other refills you may need. You are otherwise up to date with prevention Please return in 4 mo with Lab testing done 3-5 days before

## 2012-05-15 NOTE — Assessment & Plan Note (Addendum)
Mild to mod, pt declines statin, for diet and pt wants to try "benecol" otc supplement,  to f/u any worsening symptoms or concerns

## 2012-05-15 NOTE — Progress Notes (Signed)
Subjective:    Patient ID: [name], female    DOB: [birth year], 61 y.o.   MRN: [id number]  HPI  Here for wellness and f/u;  Overall doing ok;  Pt denies CP, worsening SOB, DOE, wheezing, orthopnea, PND, worsening LE edema, palpitations, dizziness or syncope.  Pt denies neurological change such as new Headache, facial or extremity weakness.  Pt denies polydipsia, polyuria, or low sugar symptoms. Pt states overall good compliance with treatment and medications, good tolerability, and trying to follow lower cholesterol diet.  Pt denies worsening depressive symptoms, suicidal ideation or panic. No fever, wt loss, night sweats, loss of appetite, or other constitutional symptoms.  Pt states good ability with ADL's, low fall risk, home safety reviewed and adequate, no significant changes in hearing or vision, and occasionally active with exercise. Is going to the gym soon.  Has had some neck aching recntly working on the computer but no radicualr pain, and doesnot think she needs to see Dr Roy/NS now. Past Medical History  Diagnosis Date  . GERD (gastroesophageal reflux disease)   . Arthritis   . Diverticulosis 02/11/2012  . OVERACTIVE BLADDER 08/10/2007    Qualifier: Diagnosis of  By: Linna Darner, CMA, Cindy    . GERD 08/10/2007    Qualifier: Diagnosis of  By: Linna Darner, CMA, Cindy    . ARTHRITIS, KNEE 02/10/2009    Qualifier: Diagnosis of  By: Alphonzo Severance MD, Lance Muss, ATROPHIC 11/11/2008    Qualifier: Diagnosis of  By: Alphonzo Severance MD, Loni Dolly HEMORRHOIDS-INTERNAL 07/21/2010    Qualifier: Diagnosis of  By: Wilmon Pali NP, Gunnar Fusi    . Cervical cancer   . Allergic rhinitis, cause unspecified   . Chronic LBP   . Obesity   . Degenerative arthritis of hip 02/11/2012  . Cervical disc disease 02/11/2012  . Hyperlipidemia 02/13/2012   Past Surgical History  Procedure Date  . Neck surgery   . Abdominal hysterectomy   . Spine surgery   . Mmk / anterior vesicourethropexy / urethropexy   . Foot surgury     . Shoulder surgury   . Tonsillectomy   . Vaginal surgury   . Cystocele repair   . Rectocele repair     reports that she has never smoked. She has never used smokeless tobacco. She reports that she drinks alcohol. She reports that she does not use illicit drugs. family history includes Cancer in her mother and sister; Diabetes in her brother, maternal aunt, and maternal uncle; and Heart disease in her brother and sister. Allergies  Allergen Reactions  . Amoxicillin     REACTION: unspecified  . Penicillins    Current Outpatient Prescriptions on File Prior to Visit  Medication Sig Dispense Refill  . aspirin 81 MG tablet Take 81 mg by mouth daily.        . cholecalciferol (VITAMIN D) 1000 UNITS tablet Take 1,000 Units by mouth daily.        . cyclobenzaprine (FLEXERIL) 10 MG tablet Take 1 tablet (10 mg total) by mouth every 8 (eight) hours as needed for muscle spasms.  60 tablet  5  . ENABLEX 15 MG 24 hr tablet TAKE ONE TABLET BY MOUTH ONE TIME DAILY  90 each  3  . fish oil-omega-3 fatty acids 1000 MG capsule Take 2 g by mouth daily.        [name] Kitchen HYDROcodone-acetaminophen (VICODIN) 5-500 MG per tablet 1-2 q4h prn pain  100 tablet  5  . MULTIPLE  VITAMIN tablet Take 1 tablet by mouth daily.      . polyethylene glycol (MIRALAX / GLYCOLAX) packet Take 17 g by mouth daily as needed.        . nabumetone (RELAFEN) 750 MG tablet Take 1 tablet (750 mg total) by mouth 2 (two) times daily as needed. For arthritis  60 tablet  11  . pantoprazole (PROTONIX) 40 MG tablet Take 1 tablet (40 mg total) by mouth daily.  30 tablet  11   Review of Systems Review of Systems  Constitutional: Negative for diaphoresis, activity change, appetite change and unexpected weight change.  HENT: Negative for hearing loss, ear pain, facial swelling, mouth sores and neck stiffness.   Eyes: Negative for pain, redness and visual disturbance.  Respiratory: Negative for shortness of breath and wheezing.   Cardiovascular:  Negative for chest pain and palpitations.  Gastrointestinal: Negative for diarrhea, blood in stool, abdominal distention and rectal pain.  Genitourinary: Negative for hematuria, flank pain and decreased urine volume.  Musculoskeletal: Negative for myalgias and joint swelling.  Skin: Negative for color change and wound.  Neurological: Negative for syncope and numbness.  Hematological: Negative for adenopathy.  Psychiatric/Behavioral: Negative for hallucinations, self-injury, decreased concentration and agitation.      Objective:   Physical Exam BP 110/82  Pulse 60  Temp 97.1 F (36.2 C) (Oral)  Ht 5' 7.5" (1.715 m)  Wt 233 lb 2 oz (105.745 kg)  BMI 35.97 kg/m2  SpO2 97% Physical Exam  VS noted Constitutional: Pt is oriented to person, place, and time. Appears well-developed and well-nourished. [name] HENT:  Head: Normocephalic and atraumatic.  Right Ear: External ear normal.  Left Ear: External ear normal.  Nose: Nose normal.  Mouth/Throat: Oropharynx is clear and moist.  Eyes: Conjunctivae and EOM are normal. Pupils are equal, round, and reactive to light.  Neck: Normal range of motion. Neck supple. No JVD present. No tracheal deviation present.  Cardiovascular: Normal rate, regular rhythm, normal heart sounds and intact distal pulses.   Pulmonary/Chest: Effort normal and breath sounds normal.  Abdominal: Soft. Bowel sounds are normal. There is no tenderness.  Musculoskeletal: Normal range of motion. Exhibits no edema.  Lymphadenopathy:  Has no cervical adenopathy.  Neurological: Pt is alert and oriented to person, place, and time. Pt has normal reflexes. No cranial nerve deficit.  Skin: Skin is warm and dry. No rash noted.  Psychiatric:  Has  normal mood and affect. Behavior is normal.     Assessment & Plan:

## 2012-05-15 NOTE — Assessment & Plan Note (Signed)

## 2012-06-12 ENCOUNTER — Other Ambulatory Visit: Payer: Self-pay | Admitting: Family Medicine

## 2012-06-14 ENCOUNTER — Other Ambulatory Visit: Payer: Self-pay

## 2012-06-14 MED ORDER — NITROFURANTOIN MONOHYD MACRO 100 MG PO CAPS: 100.0000 mg | ORAL_CAPSULE | Freq: Every day | ORAL | Status: DC

## 2012-06-14 MED ORDER — NABUMETONE 750 MG PO TABS: 750.0000 mg | ORAL_TABLET | Freq: Two times a day (BID) | ORAL | Status: DC | PRN

## 2012-06-14 NOTE — Telephone Encounter (Signed)
Done erx 

## 2012-06-18 ENCOUNTER — Encounter: Payer: Self-pay | Admitting: Internal Medicine

## 2012-06-18 ENCOUNTER — Ambulatory Visit (INDEPENDENT_AMBULATORY_CARE_PROVIDER_SITE_OTHER): Payer: Federal, State, Local not specified - PPO | Admitting: Internal Medicine

## 2012-06-18 VITALS — BP 114/72 | HR 90 | Temp 97.3°F | Ht 68.0 in | Wt 230.4 lb

## 2012-06-18 DIAGNOSIS — H6690 Otitis media, unspecified, unspecified ear: Secondary | ICD-10-CM

## 2012-06-18 DIAGNOSIS — H6691 Otitis media, unspecified, right ear: Secondary | ICD-10-CM | POA: Insufficient documentation

## 2012-06-18 DIAGNOSIS — R7302 Impaired glucose tolerance (oral): Secondary | ICD-10-CM

## 2012-06-18 DIAGNOSIS — M545 Low back pain, unspecified: Secondary | ICD-10-CM

## 2012-06-18 DIAGNOSIS — G8929 Other chronic pain: Secondary | ICD-10-CM

## 2012-06-18 DIAGNOSIS — R7309 Other abnormal glucose: Secondary | ICD-10-CM

## 2012-06-18 MED ORDER — LEVOFLOXACIN 250 MG PO TABS: 250.0000 mg | ORAL_TABLET | Freq: Every day | ORAL | Status: AC

## 2012-06-18 MED ORDER — PANTOPRAZOLE SODIUM 40 MG PO TBEC: 40.0000 mg | DELAYED_RELEASE_TABLET | Freq: Every day | ORAL | Status: DC

## 2012-06-18 MED ORDER — NABUMETONE 750 MG PO TABS: 750.0000 mg | ORAL_TABLET | Freq: Two times a day (BID) | ORAL | Status: DC | PRN

## 2012-06-18 MED ORDER — NITROFURANTOIN MONOHYD MACRO 100 MG PO CAPS: 100.0000 mg | ORAL_CAPSULE | Freq: Every day | ORAL | Status: AC

## 2012-06-18 NOTE — Assessment & Plan Note (Signed)
stable overall by hx and exam, , and pt to continue medical treatment as before   

## 2012-06-18 NOTE — Progress Notes (Signed)
Subjective:    Patient ID: [name], female    DOB: 1951/05/11, 61 y.o.   MRN: [id number]  HPI  Here with acute onset mild to mod pressure like right ear pain for 3-4 days, assoc with low grade temp, several sick contacts at home;  Nothing makes better or worse.  Pt denies chest pain, increased sob or doe, wheezing, orthopnea, PND, increased LE swelling, palpitations, dizziness or syncope.  Denies worsening reflux, dysphagia, abd pain, n/v, bowel change or blood.  Needs med refills. Pt denies new neurological symptoms such as new headache, or facial or extremity weakness or numbness   Pt denies polydipsia, polyuria. Pt continues to have recurring LBP without change in severity, bowel or bladder change, fever, wt loss,  worsening LE pain/numbness/weakness, gait change or falls. Past Medical History  Diagnosis Date  . GERD (gastroesophageal reflux disease)   . Arthritis   . Diverticulosis 02/11/2012  . OVERACTIVE BLADDER 08/10/2007    Qualifier: Diagnosis of  By: Linna Darner, CMA, Cindy    . GERD 08/10/2007    Qualifier: Diagnosis of  By: Linna Darner, CMA, Cindy    . ARTHRITIS, KNEE 02/10/2009    Qualifier: Diagnosis of  By: Alphonzo Severance MD, Lance Muss, ATROPHIC 11/11/2008    Qualifier: Diagnosis of  By: Alphonzo Severance MD, Loni Dolly HEMORRHOIDS-INTERNAL 07/21/2010    Qualifier: Diagnosis of  By: Wilmon Pali NP, Gunnar Fusi    . Cervical cancer   . Allergic rhinitis, cause unspecified   . Chronic LBP   . Obesity   . Degenerative arthritis of hip 02/11/2012  . Cervical disc disease 02/11/2012  . Hyperlipidemia 02/13/2012   Past Surgical History  Procedure Date  . Neck surgery   . Abdominal hysterectomy   . Spine surgery   . Mmk / anterior vesicourethropexy / urethropexy   . Foot surgury   . Shoulder surgury   . Tonsillectomy   . Vaginal surgury   . Cystocele repair   . Rectocele repair     reports that she has never smoked. She has never used smokeless tobacco. She reports that she drinks alcohol.  She reports that she does not use illicit drugs. family history includes Cancer in her mother and sister; Diabetes in her brother, maternal aunt, and maternal uncle; and Heart disease in her brother and sister. Allergies  Allergen Reactions  . Amoxicillin     REACTION: unspecified  . Penicillins    Current Outpatient Prescriptions on File Prior to Visit  Medication Sig Dispense Refill  . aspirin 81 MG tablet Take 81 mg by mouth daily.        . cholecalciferol (VITAMIN D) 1000 UNITS tablet Take 1,000 Units by mouth daily.        . cyclobenzaprine (FLEXERIL) 10 MG tablet Take 1 tablet (10 mg total) by mouth every 8 (eight) hours as needed for muscle spasms.  60 tablet  5  . darifenacin (ENABLEX) 15 MG 24 hr tablet Take 1 tablet (15 mg total) by mouth daily.  90 tablet  3  . fish oil-omega-3 fatty acids 1000 MG capsule Take 2 g by mouth daily.        [name] Kitchen HYDROcodone-acetaminophen (VICODIN) 5-500 MG per tablet 1-2 q4h prn pain  100 tablet  3  . MULTIPLE VITAMIN tablet Take 1 tablet by mouth daily.      . polyethylene glycol (MIRALAX / GLYCOLAX) packet Take 17 g by mouth daily as needed.        [name] Kitchen  DISCONTD: pantoprazole (PROTONIX) 40 MG tablet Take 1 tablet (40 mg total) by mouth daily.  30 tablet  11   Review of Systems Constitutional: Negative for diaphoresis and unexpected weight change.  HENT: Negative for tinnitus.   Eyes: Negative for photophobia and visual disturbance.  Respiratory: Negative for choking and stridor.   Gastrointestinal: Negative for vomiting and blood in stool.  Genitourinary: Negative for hematuria and decreased urine volume.  Musculoskeletal: Negative for gait problem.  Skin: Negative for color change and wound.  Neurological: Negative for tremors and numbness.  Psychiatric/Behavioral: Negative for decreased concentration. The patient is not hyperactive.      Objective:   Physical Exam BP 114/72  Pulse 90  Temp 97.3 F (36.3 C) (Oral)  Ht 5\' 8"  (1.727 m)  Wt  230 lb 6 oz (104.497 kg)  BMI 35.03 kg/m2  SpO2 95% Physical Exam  VS noted, mild ill appaering Constitutional: Pt appears well-developed and well-nourished.  HENT: Head: Normocephalic.  Right Ear: External ear normal.  Left Ear: External ear normal.  Right tm's severe erythema.  Sinus nontender.  Pharynx mild erythema Eyes: Conjunctivae and EOM are normal. Pupils are equal, round, and reactive to light.  Neck: Normal range of motion. Neck supple.  Cardiovascular: Normal rate and regular rhythm.   Pulmonary/Chest: Effort normal and breath sounds normal.  Neurological: Pt is alert. No cranial nerve deficit. motor/gait intact Skin: Skin is warm. No erythema. No rash Psychiatric: Pt behavior is normal. Thought content normal.     Assessment & Plan:

## 2012-06-18 NOTE — Patient Instructions (Addendum)
Take all new medications as prescribed Continue all other medications as before You can also take Mucinex (or it's generic off brand) for congestion Your medications were refilled today as requested

## 2012-06-18 NOTE — Assessment & Plan Note (Signed)
stable overall by hx and exam, most recent data reviewed with pt, and pt to continue medical treatment as before Lab Results  Component Value Date   HGBA1C 5.9 05/08/2012    

## 2012-06-18 NOTE — Assessment & Plan Note (Signed)
Mild to mod, for antibx course,  to f/u any worsening symptoms or concerns 

## 2012-09-11 ENCOUNTER — Encounter: Payer: Self-pay | Admitting: Internal Medicine

## 2012-09-11 ENCOUNTER — Other Ambulatory Visit (INDEPENDENT_AMBULATORY_CARE_PROVIDER_SITE_OTHER): Payer: Federal, State, Local not specified - PPO

## 2012-09-11 DIAGNOSIS — E785 Hyperlipidemia, unspecified: Secondary | ICD-10-CM

## 2012-09-11 LAB — LIPID PANEL
HDL: 40.3 mg/dL (ref >39.00)
LDL Cholesterol: 124 mg/dL — ABNORMAL HIGH (ref 0–99)
Total CHOL/HDL Ratio: 5
Triglycerides: 95 mg/dL (ref 0.0–149.0)

## 2012-09-12 ENCOUNTER — Encounter: Payer: Self-pay | Admitting: Internal Medicine

## 2012-09-12 ENCOUNTER — Ambulatory Visit (INDEPENDENT_AMBULATORY_CARE_PROVIDER_SITE_OTHER): Payer: Federal, State, Local not specified - PPO | Admitting: Internal Medicine

## 2012-09-12 VITALS — BP 114/68 | HR 67 | Temp 97.0°F | Ht 68.0 in | Wt 235.0 lb

## 2012-09-12 DIAGNOSIS — E785 Hyperlipidemia, unspecified: Secondary | ICD-10-CM

## 2012-09-12 DIAGNOSIS — R7309 Other abnormal glucose: Secondary | ICD-10-CM

## 2012-09-12 DIAGNOSIS — Z0000 Encounter for general adult medical examination without abnormal findings: Secondary | ICD-10-CM

## 2012-09-12 DIAGNOSIS — Z23 Encounter for immunization: Secondary | ICD-10-CM

## 2012-09-12 DIAGNOSIS — G8929 Other chronic pain: Secondary | ICD-10-CM

## 2012-09-12 DIAGNOSIS — M545 Low back pain, unspecified: Secondary | ICD-10-CM

## 2012-09-12 DIAGNOSIS — R7302 Impaired glucose tolerance (oral): Secondary | ICD-10-CM

## 2012-09-12 MED ORDER — ATORVASTATIN CALCIUM 10 MG PO TABS: 10.0000 mg | ORAL_TABLET | Freq: Every day | ORAL | Status: DC

## 2012-09-12 NOTE — Progress Notes (Signed)
Subjective:    Patient ID: [name], female    DOB: 1950/11/13, 61 y.o.   MRN: [id number]  HPI  Here to f/u; overall doing ok,  Pt denies chest pain, increased sob or doe, wheezing, orthopnea, PND, increased LE swelling, palpitations, dizziness or syncope.  Pt denies new neurological symptoms such as new headache, or facial or extremity weakness or numbness   Pt denies polydipsia, polyuria, or low sugar symptoms such as weakness or confusion improved with po intake.  Pt states overall good compliance with meds, trying to follow lower cholesterol, diabetic diet, wt overall stable but little exercise however.  Has been using the "benicol" and tolerating ok.    Pt continues to have recurring LBP without change in severity, bowel or bladder change, fever, wt loss,  worsening LE pain/numbness/weakness, gait change or falls.  Past Medical History  Diagnosis Date  . GERD (gastroesophageal reflux disease)   . Arthritis   . Diverticulosis 02/11/2012  . OVERACTIVE BLADDER 08/10/2007    Qualifier: Diagnosis of  By: Linna Darner, CMA, Cindy    . GERD 08/10/2007    Qualifier: Diagnosis of  By: Linna Darner, CMA, Cindy    . ARTHRITIS, KNEE 02/10/2009    Qualifier: Diagnosis of  By: Alphonzo Severance MD, Lance Muss, ATROPHIC 11/11/2008    Qualifier: Diagnosis of  By: Alphonzo Severance MD, Loni Dolly HEMORRHOIDS-INTERNAL 07/21/2010    Qualifier: Diagnosis of  By: Wilmon Pali NP, Gunnar Fusi    . Cervical cancer   . Allergic rhinitis, cause unspecified   . Chronic LBP   . Obesity   . Degenerative arthritis of hip 02/11/2012  . Cervical disc disease 02/11/2012  . Hyperlipidemia 02/13/2012   Past Surgical History  Procedure Date  . Neck surgery   . Abdominal hysterectomy   . Spine surgery   . Mmk / anterior vesicourethropexy / urethropexy   . Foot surgury   . Shoulder surgury   . Tonsillectomy   . Vaginal surgury   . Cystocele repair   . Rectocele repair     reports that she has never smoked. She has never used smokeless  tobacco. She reports that she drinks alcohol. She reports that she does not use illicit drugs. family history includes Cancer in her mother and sister; Diabetes in her brother, maternal aunt, and maternal uncle; and Heart disease in her brother and sister. Allergies  Allergen Reactions  . Amoxicillin     REACTION: unspecified  . Penicillins    Current Outpatient Prescriptions on File Prior to Visit  Medication Sig Dispense Refill  . aspirin 81 MG tablet Take 81 mg by mouth daily.        . cholecalciferol (VITAMIN D) 1000 UNITS tablet Take 1,000 Units by mouth daily.        [name] Kitchen darifenacin (ENABLEX) 15 MG 24 hr tablet Take 1 tablet (15 mg total) by mouth daily.  90 tablet  3  . fish oil-omega-3 fatty acids 1000 MG capsule Take 2 g by mouth daily.        [name] Kitchen HYDROcodone-acetaminophen (VICODIN) 5-500 MG per tablet 1-2 q4h prn pain  100 tablet  3  . MULTIPLE VITAMIN tablet Take 1 tablet by mouth daily.      . nabumetone (RELAFEN) 750 MG tablet Take 1 tablet (750 mg total) by mouth 2 (two) times daily as needed. For arthritis  60 tablet  5  . pantoprazole (PROTONIX) 40 MG tablet Take 1 tablet (40 mg total) by  mouth daily.  30 tablet  11  . polyethylene glycol (MIRALAX / GLYCOLAX) packet Take 17 g by mouth daily as needed.        [name] Kitchen atorvastatin (LIPITOR) 10 MG tablet Take 1 tablet (10 mg total) by mouth daily.  90 tablet  3   Review of Systems  Constitutional: Negative for diaphoresis and unexpected weight change.  HENT: Negative for tinnitus.   Eyes: Negative for photophobia and visual disturbance.  Respiratory: Negative for choking and stridor.   Gastrointestinal: Negative for vomiting and blood in stool.  Genitourinary: Negative for hematuria and decreased urine volume.  Musculoskeletal: Negative for gait problem.  Skin: Negative for color change and wound.  Neurological: Negative for tremors and numbness.  Psychiatric/Behavioral: Negative for decreased concentration. The patient is not  hyperactive.       Objective:   Physical Exam BP 114/68  Pulse 67  Temp 97 F (36.1 C) (Oral)  Ht 5\' 8"  (1.727 m)  Wt 235 lb (106.595 kg)  BMI 35.73 kg/m2  SpO2 95% Physical Exam  VS noted Constitutional: Pt appears well-developed and well-nourished.  HENT: Head: Normocephalic.  Right Ear: External ear normal.  Left Ear: External ear normal.  Eyes: Conjunctivae and EOM are normal. Pupils are equal, round, and reactive to light.  Neck: Normal range of motion. Neck supple.  Cardiovascular: Normal rate and regular rhythm.   Pulmonary/Chest: Effort normal and breath sounds normal.  Spine:  nontender Neurological: Pt is alert. Not confused  Skin: Skin is warm. No erythema.  Psychiatric: Pt behavior is normal. Thought content normal.     Assessment & Plan:

## 2012-09-12 NOTE — Assessment & Plan Note (Signed)
Uncontrolled on her otc supplement and low chol diet, for lipitor asd,  to f/u any worsening symptoms or concerns Lab Results  Component Value Date   LDLCALC 124* 09/11/2012

## 2012-09-12 NOTE — Assessment & Plan Note (Signed)
stable overall by hx and exam, most recent data reviewed with pt, and pt to continue medical treatment as before Lab Results  Component Value Date   HGBA1C 5.9 05/08/2012

## 2012-09-12 NOTE — Patient Instructions (Addendum)
You had the flu shot today Take all new medications as prescribed - the lipitor 10 mg per day Continue all other medications as before Please return in July 2013 with Lab testing done 3-5 days before

## 2012-09-12 NOTE — Assessment & Plan Note (Signed)
stable overall by hx and exam, most recent data reviewed with pt, and pt to continue medical treatment as before Lab Results  Component Value Date   WBC 8.4 05/08/2012   HGB 13.5 05/08/2012   HCT 40.2 05/08/2012   PLT 213.0 05/08/2012   GLUCOSE 95 05/08/2012   CHOL 183 09/11/2012   TRIG 95.0 09/11/2012   HDL 40.30 09/11/2012   LDLDIRECT 135.1 05/08/2012   LDLCALC 124* 09/11/2012   ALT 15 05/08/2012   AST 17 05/08/2012   NA 141 05/08/2012   K 3.7 05/08/2012   CL 106 05/08/2012   CREATININE 0.8 05/08/2012   BUN 19 05/08/2012   CO2 26 05/08/2012   TSH 1.97 05/08/2012   HGBA1C 5.9 05/08/2012

## 2012-12-07 ENCOUNTER — Encounter: Payer: Self-pay | Admitting: Internal Medicine

## 2012-12-07 ENCOUNTER — Ambulatory Visit (INDEPENDENT_AMBULATORY_CARE_PROVIDER_SITE_OTHER): Payer: Federal, State, Local not specified - PPO | Admitting: Internal Medicine

## 2012-12-07 VITALS — BP 130/80 | HR 65 | Temp 97.7°F | Ht 68.0 in | Wt 231.5 lb

## 2012-12-07 DIAGNOSIS — R7302 Impaired glucose tolerance (oral): Secondary | ICD-10-CM

## 2012-12-07 DIAGNOSIS — G8929 Other chronic pain: Secondary | ICD-10-CM

## 2012-12-07 DIAGNOSIS — M545 Low back pain, unspecified: Secondary | ICD-10-CM

## 2012-12-07 DIAGNOSIS — R7309 Other abnormal glucose: Secondary | ICD-10-CM

## 2012-12-07 DIAGNOSIS — H6690 Otitis media, unspecified, unspecified ear: Secondary | ICD-10-CM

## 2012-12-07 DIAGNOSIS — H6692 Otitis media, unspecified, left ear: Secondary | ICD-10-CM

## 2012-12-07 MED ORDER — HYDROCODONE-ACETAMINOPHEN 5-325 MG PO TABS: 1.0000 | ORAL_TABLET | Freq: Four times a day (QID) | ORAL | Status: DC | PRN

## 2012-12-07 MED ORDER — LEVOFLOXACIN 250 MG PO TABS: 250.0000 mg | ORAL_TABLET | Freq: Every day | ORAL | Status: DC

## 2012-12-07 NOTE — Patient Instructions (Addendum)
Please take all new medication as prescribed Please continue all other medications as before, and refills have been done if requested. - the hydrocodone Thank you for enrolling in MyChart. Please follow the instructions below to securely access your online medical record. MyChart allows you to send messages to your doctor, view your test results, renew your prescriptions, schedule appointments, and more. To Log into My Chart online, please go by [employer] or [employer] to [email or web address], or download the MyChart App from the [employer] of [employer] .  Your Username is:  [email or web address] (pass cosmo) Please send a Engineer, water on Mychart later today Please return in August 2014, or sooner if needed, with Lab testing done 3-5 days before

## 2012-12-08 ENCOUNTER — Encounter: Payer: Self-pay | Admitting: Internal Medicine

## 2012-12-08 NOTE — Progress Notes (Signed)
Subjective:    Patient ID: [name], female    DOB: [birth year], 62 y.o.   MRN: [id number]  HPI   Here with 2-3 days acute onset fever, left ear and facial pain, pressure, headache, general weakness and malaise, with mild ST and cough, but pt denies chest pain, wheezing, increased sob or doe, orthopnea, PND, increased LE swelling, palpitations, dizziness or syncope. Pt denies new neurological symptoms such as new headache, or facial or extremity weakness or numbness   Pt denies polydipsia, polyuria.  .Continues to have recurring LBP without change in severity, bowel or bladder change, fever, wt loss,  worsening LE pain/numbness/weakness, gait change or falls. Needs pain med refills as she no longer sees surgury for this Past Medical History  Diagnosis Date  . GERD (gastroesophageal reflux disease)   . Arthritis   . Diverticulosis 02/11/2012  . OVERACTIVE BLADDER 08/10/2007    Qualifier: Diagnosis of  By: Linna Darner, CMA, Cindy    . GERD 08/10/2007    Qualifier: Diagnosis of  By: Linna Darner, CMA, Cindy    . ARTHRITIS, KNEE 02/10/2009    Qualifier: Diagnosis of  By: Alphonzo Severance MD, Lance Muss, ATROPHIC 11/11/2008    Qualifier: Diagnosis of  By: Alphonzo Severance MD, Loni Dolly HEMORRHOIDS-INTERNAL 07/21/2010    Qualifier: Diagnosis of  By: Wilmon Pali NP, Gunnar Fusi    . Cervical cancer   . Allergic rhinitis, cause unspecified   . Chronic LBP   . Obesity   . Degenerative arthritis of hip 02/11/2012  . Cervical disc disease 02/11/2012  . Hyperlipidemia 02/13/2012   Past Surgical History  Procedure Date  . Neck surgery   . Abdominal hysterectomy   . Spine surgery   . Mmk / anterior vesicourethropexy / urethropexy   . Foot surgury   . Shoulder surgury   . Tonsillectomy   . Vaginal surgury   . Cystocele repair   . Rectocele repair     reports that she has never smoked. She has never used smokeless tobacco. She reports that she drinks alcohol. She reports that she does not use illicit drugs. family  history includes Cancer in her mother and sister; Diabetes in her brother, maternal aunt, and maternal uncle; and Heart disease in her brother and sister. Allergies  Allergen Reactions  . Amoxicillin     REACTION: unspecified  . Penicillins    Current Outpatient Prescriptions on File Prior to Visit  Medication Sig Dispense Refill  . aspirin 81 MG tablet Take 81 mg by mouth daily.        [name] Kitchen atorvastatin (LIPITOR) 10 MG tablet Take 1 tablet (10 mg total) by mouth daily.  90 tablet  3  . cholecalciferol (VITAMIN D) 1000 UNITS tablet Take 1,000 Units by mouth daily.        [name] Kitchen darifenacin (ENABLEX) 15 MG 24 hr tablet Take 1 tablet (15 mg total) by mouth daily.  90 tablet  3  . fish oil-omega-3 fatty acids 1000 MG capsule Take 2 g by mouth daily.        . MULTIPLE VITAMIN tablet Take 1 tablet by mouth daily.      . nabumetone (RELAFEN) 750 MG tablet Take 1 tablet (750 mg total) by mouth 2 (two) times daily as needed. For arthritis  60 tablet  5  . pantoprazole (PROTONIX) 40 MG tablet Take 1 tablet (40 mg total) by mouth daily.  30 tablet  11   Review of Systems  Constitutional: Negative  for unexpected weight change, or unusual diaphoresis  HENT: Negative for tinnitus.   Eyes: Negative for photophobia and visual disturbance.  Respiratory: Negative for choking and stridor.   Gastrointestinal: Negative for vomiting and blood in stool.  Genitourinary: Negative for hematuria and decreased urine volume.  Musculoskeletal: Negative for acute joint swelling Skin: Negative for color change and wound.  Neurological: Negative for tremors and numbness other than noted  Psychiatric/Behavioral: Negative for decreased concentration or  hyperactivity.       Objective:   Physical Exam BP 130/80  Pulse 65  Temp 97.7 F (36.5 C) (Oral)  Ht 5\' 8"  (1.727 m)  Wt 231 lb 8 oz (105.008 kg)  BMI 35.20 kg/m2  SpO2 96% VS noted, midl ill appaering Constitutional: Pt appears well-developed and well-nourished.   HENT: Head: NCAT.  Right Ear: External ear normal. Right TM mild erythema, nonbulging Left Ear: External ear normal.Left TM severe erythema, bulging, + light reflex  Eyes: Conjunctivae and EOM are normal. Pupils are equal, round, and reactive to light.  Neck: Normal range of motion. Neck supple.  Cardiovascular: Normal rate and regular rhythm.   Pulmonary/Chest: Effort normal and breath sounds normal.  Neurological: Pt is alert. Not confused  Skin: Skin is warm. No erythema.  Psychiatric: Pt behavior is normal. Thought content normal.     Assessment & Plan:

## 2012-12-08 NOTE — Assessment & Plan Note (Signed)
Mild to mod, for antibx course,  to f/u any worsening symptoms or concerns 

## 2012-12-08 NOTE — Assessment & Plan Note (Signed)
stable overall by history and exam, and pt to continue medical treatment as before,  to f/u any worsening symptoms or concerns 

## 2012-12-08 NOTE — Assessment & Plan Note (Signed)
stable overall by history and exam, recent data reviewed with pt, and pt to continue medical treatment as before,  to f/u any worsening symptoms or concerns Lab Results  Component Value Date   HGBA1C 5.9 05/08/2012

## 2013-02-12 ENCOUNTER — Encounter: Payer: Self-pay | Admitting: Internal Medicine

## 2013-02-18 ENCOUNTER — Encounter: Payer: Self-pay | Admitting: Internal Medicine

## 2013-03-13 ENCOUNTER — Encounter: Payer: Self-pay | Admitting: Certified Nurse Midwife

## 2013-03-14 ENCOUNTER — Ambulatory Visit (INDEPENDENT_AMBULATORY_CARE_PROVIDER_SITE_OTHER): Payer: Federal, State, Local not specified - PPO | Admitting: Obstetrics & Gynecology

## 2013-03-14 ENCOUNTER — Encounter: Payer: Self-pay | Admitting: Obstetrics & Gynecology

## 2013-03-14 VITALS — BP 124/78 | Ht 67.0 in | Wt 234.4 lb

## 2013-03-14 DIAGNOSIS — Z01419 Encounter for gynecological examination (general) (routine) without abnormal findings: Secondary | ICD-10-CM

## 2013-03-14 MED ORDER — ESTROGENS CONJUGATED 0.625 MG PO TABS: 0.6250 mg | ORAL_TABLET | Freq: Every day | ORAL | Status: DC

## 2013-03-14 MED ORDER — SOLIFENACIN SUCCINATE 10 MG PO TABS: 10.0000 mg | ORAL_TABLET | Freq: Every day | ORAL | Status: DC

## 2013-03-14 NOTE — Patient Instructions (Signed)

## 2013-03-14 NOTE — Progress Notes (Addendum)
62 y.o. G1P1001 MarriedCaucasianF here for annual exam.  Having trouble with the patch staying on her skin.  Did have [facility] call back.  A little anxious about this.  PCP is Dr. Oliver Barre.  No LMP recorded. Patient has had a hysterectomy.          Sexually active: yes  The current method of family planning is status post hysterectomy.    Exercising: no   Smoker:  no  Health Maintenance: Pap:  11/24/10 WNL Abnormal Pap hx:  1986--TVH due to abnormal Pap smears MMG:  3/14 [facility]-mmg, then additional views-recommended 6 month follow up Colonoscopy:  12/11 repeat 10 years, Dr. Russella Dar BMD:   2009 TDaP:  Up to date per patient  Labs: 6/13 with PCP   reports that she has never smoked. She has never used smokeless tobacco. She reports that she does not drink alcohol or use illicit drugs.  Past Medical History  Diagnosis Date  . GERD (gastroesophageal reflux disease)   . Arthritis   . Diverticulosis 02/11/2012  . OVERACTIVE BLADDER 08/10/2007    Qualifier: Diagnosis of  By: Linna Darner, CMA, Cindy    . GERD 08/10/2007    Qualifier: Diagnosis of  By: Linna Darner, CMA, Cindy    . ARTHRITIS, KNEE 02/10/2009    Qualifier: Diagnosis of  By: Alphonzo Severance MD, Lance Muss, ATROPHIC 11/11/2008    Qualifier: Diagnosis of  By: Alphonzo Severance MD, Loni Dolly HEMORRHOIDS-INTERNAL 07/21/2010    Qualifier: Diagnosis of  By: Wilmon Pali NP, Gunnar Fusi    . Cervical cancer   . Allergic rhinitis, cause unspecified   . Chronic LBP   . Obesity   . Degenerative arthritis of hip 02/11/2012  . Cervical disc disease 02/11/2012  . Hyperlipidemia 02/13/2012  . Recurrent UTI   . Hiatal hernia   . GERD (gastroesophageal reflux disease)   . SUI (stress urinary incontinence, female)   . OAB (overactive bladder)     Past Surgical History  Procedure Laterality Date  . Neck surgery    . Spine surgery    . Mmk / anterior vesicourethropexy / urethropexy    . Foot surgury    . Shoulder surgury    . Tonsillectomy    . Vaginal surgury     . Cystocele repair    . Rectocele repair    . Vaginal hysterectomy      Current Outpatient Prescriptions  Medication Sig Dispense Refill  . aspirin 81 MG tablet Take 81 mg by mouth daily.        . cholecalciferol (VITAMIN D) 1000 UNITS tablet Take 1,000 Units by mouth daily.        [name] Kitchen darifenacin (ENABLEX) 15 MG 24 hr tablet Take 1 tablet (15 mg total) by mouth daily.  90 tablet  3  . fish oil-omega-3 fatty acids 1000 MG capsule Take 2 g by mouth daily.        [name] Kitchen HYDROcodone-acetaminophen (NORCO/VICODIN) 5-325 MG per tablet Take 1 tablet by mouth every 6 (six) hours as needed for pain.  60 tablet  1  . MULTIPLE VITAMIN tablet Take 1 tablet by mouth daily.      . nabumetone (RELAFEN) 750 MG tablet Take 1 tablet (750 mg total) by mouth 2 (two) times daily as needed. For arthritis  60 tablet  5  . nitrofurantoin (MACRODANTIN) 100 MG capsule Take 100 mg by mouth.      . pantoprazole (PROTONIX) 40 MG tablet Take 1 tablet (40  mg total) by mouth daily.  30 tablet  11  . atorvastatin (LIPITOR) 10 MG tablet Take 1 tablet (10 mg total) by mouth daily.  90 tablet  3  . estradiol (VIVELLE-DOT) 0.075 MG/24HR Place 1 patch onto the skin 2 (two) times a week.       No current facility-administered medications for this visit.    Family History  Problem Relation Age of Onset  . Breast cancer Mother   . Heart disease Sister   . Breast cancer Sister   . Diabetes Brother   . Heart disease Brother   . Diabetes Maternal Aunt   . Diabetes Maternal Uncle   . Cancer Maternal Grandmother     uterine    ROS:  Pertinent items are noted in HPI.  Otherwise, a comprehensive ROS was negative.  Exam:   BP 124/78  Ht 5\' 7"  (1.702 m)  Wt 234 lb 6.4 oz (106.323 kg)  BMI 36.7 kg/m2  Wt change: -2lb   Height: 5\' 7"  (170.2 cm)  Ht Readings from Last 3 Encounters:  03/14/13 5\' 7"  (1.702 m)  12/07/12 5\' 8"  (1.727 m)  09/12/12 5\' 8"  (1.727 m)    General appearance: alert, cooperative and appears stated  age Head: Normocephalic, without obvious abnormality, atraumatic Neck: no adenopathy, supple, symmetrical, trachea midline and thyroid normal to inspection and palpation Lungs: clear to auscultation bilaterally Breasts: normal appearance, no masses or tenderness Heart: regular rate and rhythm Abdomen: soft, non-tender; bowel sounds normal; no masses,  no organomegaly Extremities: extremities normal, atraumatic, no cyanosis or edema Skin: Skin color, texture, turgor normal. No rashes or lesions Lymph nodes: Cervical, supraclavicular, and axillary nodes normal. No abnormal inguinal nodes palpated Neurologic: Grossly normal   Pelvic: External genitalia:  no lesions              Urethra:  normal appearing urethra with no masses, tenderness or lesions              Bartholins and Skenes: normal                 Vagina: normal appearing vagina with normal color and discharge, no lesions, second degree cystocele              Cervix: absent              Pap taken: no Bimanual Exam:  Uterus:  uterus absent              Adnexa: no mass, fullness, tenderness               Rectovaginal: Confirms               Anus:  normal sphincter tone, no lesions  A:  Well Woman with normal exam Strong family hx of breast cancer--mother and sister H/o TVH due to abnormal paps 1986, neg evaluation Elevated cholesterol  P:   Mammogram--has 6 month follow-up scheduled Switch to Premarin 0.625 due to Miniv[place] patch not sticking.  Rx to pharmacy. BRCA testing today Trial of Vesicare 10mg  qd.  Enablex is quite pricey for pt. No Pap smear. H/O TVH due to abnormal Paps. return annually or prn  An After Visit Summary was printed and given to the patient.   BRCA testing negative.  03/25/13 [facility]

## 2013-03-19 ENCOUNTER — Telehealth: Payer: Self-pay | Admitting: Obstetrics & Gynecology

## 2013-03-19 NOTE — Telephone Encounter (Signed)
Fax received from [facility] and age of grandmother at diagnosis was 103.  Call to patient to confirm info and see if MGM or PGM.  Left message.  Paper chart to your office.

## 2013-03-19 NOTE — Telephone Encounter (Signed)
Patient reported to [facility] that she has family history of ovarian cancer in her grandmother at age 60 and they need new order signed with this info. She will fax form for MD to sign.

## 2013-03-19 NOTE — Telephone Encounter (Signed)
Please call Harlin Heys @ [facility]  concerning [name]

## 2013-03-20 NOTE — Telephone Encounter (Signed)
3rd attempt, LMTCB.  Patient has apparently called but i was on another line and she is not avail when I call her.

## 2013-03-20 NOTE — Telephone Encounter (Signed)
Patient returns call and confirms maternal grandmother with ovarian cancer age 60 and died at age 13.  [facility] order updated and faxed.

## 2013-03-20 NOTE — Telephone Encounter (Signed)
LMTCB on cell number and home number.  Work number out of service.

## 2013-03-20 NOTE — Telephone Encounter (Signed)
Patient returning Sally's call. °

## 2013-03-20 NOTE — Telephone Encounter (Signed)
Called patient twice at cell number and LMTCB, [phone] and [phone].

## 2013-03-22 NOTE — Telephone Encounter (Signed)
BRCA test results to your office with paper chart.

## 2013-03-25 ENCOUNTER — Encounter: Payer: Self-pay | Admitting: Obstetrics & Gynecology

## 2013-03-25 ENCOUNTER — Telehealth: Payer: Self-pay | Admitting: *Deleted

## 2013-03-25 DIAGNOSIS — Z1371 Encounter for nonprocreative screening for genetic disease carrier status: Secondary | ICD-10-CM

## 2013-03-25 HISTORY — DX: Encounter for nonprocreative screening for genetic disease carrier status: Z13.71

## 2013-03-25 NOTE — Telephone Encounter (Signed)
Call to patient to give BRCA results. LMTCB

## 2013-03-26 NOTE — Telephone Encounter (Signed)
Form faxed to [facility] Lab attention to [employer].

## 2013-03-26 NOTE — Telephone Encounter (Signed)
[facility] called in reference to patient [name], who has been a patient of Dr.Miller's for some time. The lab called to request a letter of necessity in regards to patient. The letter was requested on May 14; it needs to be sent to them so that she is not billed.

## 2013-03-28 NOTE — Telephone Encounter (Signed)
Patient returned call to [facility] in regards to testing.

## 2013-03-28 NOTE — Telephone Encounter (Signed)
LMTCB, nothing wrong. Calling with good news.

## 2013-03-29 NOTE — Telephone Encounter (Signed)
Patient notified BRCA test negative and will mail her the patient copy.

## 2013-04-04 ENCOUNTER — Encounter: Payer: Self-pay | Admitting: Internal Medicine

## 2013-04-04 ENCOUNTER — Ambulatory Visit (INDEPENDENT_AMBULATORY_CARE_PROVIDER_SITE_OTHER): Payer: Federal, State, Local not specified - PPO | Admitting: Internal Medicine

## 2013-04-04 VITALS — BP 122/80 | HR 76 | Temp 97.7°F | Ht 67.0 in | Wt 234.0 lb

## 2013-04-04 DIAGNOSIS — R300 Dysuria: Secondary | ICD-10-CM

## 2013-04-04 DIAGNOSIS — N3090 Cystitis, unspecified without hematuria: Secondary | ICD-10-CM

## 2013-04-04 DIAGNOSIS — R350 Frequency of micturition: Secondary | ICD-10-CM

## 2013-04-04 DIAGNOSIS — R3915 Urgency of urination: Secondary | ICD-10-CM

## 2013-04-04 LAB — POCT URINALYSIS DIPSTICK
Bilirubin, UA: NEGATIVE
Glucose, UA: NEGATIVE
Ketones, UA: NEGATIVE
Leukocytes, UA: NEGATIVE
Nitrite, UA: NEGATIVE
pH, UA: 7.5

## 2013-04-04 NOTE — Progress Notes (Signed)
HPI  Pt presents to the clinic today with c/o urinary urgency, frequency and dysuria. This started 1 week ago. She has increased her fluid intake but it has not really helped. She has had UTI's in the past and reports this feels the same. She denies fever, chills, nausea, vomiting or flank pain. She does have a history of overactive bladder and is on Vesicare for this. She is tolerating the medication well.   Review of Systems  Past Medical History  Diagnosis Date  . GERD (gastroesophageal reflux disease)   . Arthritis   . Diverticulosis 02/11/2012  . OVERACTIVE BLADDER 08/10/2007    Qualifier: Diagnosis of  By: Linna Darner, CMA, Cindy    . GERD 08/10/2007    Qualifier: Diagnosis of  By: Linna Darner, CMA, Cindy    . ARTHRITIS, KNEE 02/10/2009    Qualifier: Diagnosis of  By: Alphonzo Severance MD, Lance Muss, ATROPHIC 11/11/2008    Qualifier: Diagnosis of  By: Alphonzo Severance MD, Loni Dolly HEMORRHOIDS-INTERNAL 07/21/2010    Qualifier: Diagnosis of  By: Wilmon Pali NP, Gunnar Fusi    . Cervical cancer   . Allergic rhinitis, cause unspecified   . Chronic LBP   . Obesity   . Degenerative arthritis of hip 02/11/2012  . Cervical disc disease 02/11/2012  . Hyperlipidemia 02/13/2012  . Recurrent UTI   . Hiatal hernia   . GERD (gastroesophageal reflux disease)   . SUI (stress urinary incontinence, female)   . OAB (overactive bladder)   . BRCA negative 03/25/13    Family History  Problem Relation Age of Onset  . Breast cancer Mother   . Heart disease Sister   . Breast cancer Sister   . Diabetes Brother   . Heart disease Brother   . Diabetes Maternal Aunt   . Diabetes Maternal Uncle   . Cancer Maternal Grandmother     uterine    History   Social History  . Marital Status: Married    Spouse Name: N/A    Number of Children: N/A  . Years of Education: N/A   Occupational History  . Not on file.   Social History Main Topics  . Smoking status: Never Smoker   . Smokeless tobacco: Never Used  . Alcohol  Use: No     Comment: occassional drink  . Drug Use: No  . Sexually Active: Yes    Birth Control/ Protection: Post-menopausal     Comment: hysterectomy   Other Topics Concern  . Not on file   Social History Narrative  . No narrative on file    Allergies  Allergen Reactions  . Amoxicillin     REACTION: unspecified  . Penicillins     Constitutional: Denies fever, malaise, fatigue, headache or abrupt weight changes.   GU: Pt reports urgency, frequency and pain with urination. Denies burning sensation, blood in urine, odor or discharge. Skin: Denies redness, rashes, lesions or ulcercations.   No other specific complaints in a complete review of systems (except as listed in HPI above).    Objective:   Physical Exam  BP 122/80  Pulse 76  Temp(Src) 97.7 F (36.5 C) (Oral)  Ht 5\' 7"  (1.702 m)  Wt 234 lb (106.142 kg)  BMI 36.64 kg/m2  SpO2 97% Wt Readings from Last 3 Encounters:  04/04/13 234 lb (106.142 kg)  03/14/13 234 lb 6.4 oz (106.323 kg)  12/07/12 231 lb 8 oz (105.008 kg)    General: Appears her stated age, well developed,  well nourished in NAD. Cardiovascular: Normal rate and rhythm. S1,S2 noted.  No murmur, rubs or gallops noted. No JVD or BLE edema. No carotid bruits noted. Pulmonary/Chest: Normal effort and positive vesicular breath sounds. No respiratory distress. No wheezes, rales or ronchi noted.  Abdomen: Soft and nontender. Normal bowel sounds, no bruits noted. No distention or masses noted. Liver, spleen and kidneys non palpable. Tender to palpation over the bladder area. No CVA tenderness.      Assessment & Plan:   Urgency, frequency and dysuria secondary to cystitis:  eRx sent if for Macrobid 100 mg BID x 5 days eRx sent in for Pyridium 200 mg TID prn Drink plenty of fluids  RTC as needed or if symptoms persist.

## 2013-04-04 NOTE — Patient Instructions (Signed)

## 2013-05-14 ENCOUNTER — Ambulatory Visit (INDEPENDENT_AMBULATORY_CARE_PROVIDER_SITE_OTHER): Payer: Federal, State, Local not specified - PPO | Admitting: Internal Medicine

## 2013-05-14 ENCOUNTER — Encounter: Payer: Self-pay | Admitting: Internal Medicine

## 2013-05-14 ENCOUNTER — Other Ambulatory Visit (INDEPENDENT_AMBULATORY_CARE_PROVIDER_SITE_OTHER): Payer: Federal, State, Local not specified - PPO

## 2013-05-14 VITALS — BP 110/70 | HR 86 | Temp 98.3°F | Ht 68.0 in | Wt 228.1 lb

## 2013-05-14 DIAGNOSIS — J209 Acute bronchitis, unspecified: Secondary | ICD-10-CM

## 2013-05-14 DIAGNOSIS — R7302 Impaired glucose tolerance (oral): Secondary | ICD-10-CM

## 2013-05-14 DIAGNOSIS — R7309 Other abnormal glucose: Secondary | ICD-10-CM

## 2013-05-14 DIAGNOSIS — J309 Allergic rhinitis, unspecified: Secondary | ICD-10-CM

## 2013-05-14 DIAGNOSIS — Z0000 Encounter for general adult medical examination without abnormal findings: Secondary | ICD-10-CM

## 2013-05-14 LAB — BASIC METABOLIC PANEL
BUN: 16 mg/dL (ref 6–23)
CO2: 29 mEq/L (ref 19–32)
Calcium: 9.2 mg/dL (ref 8.4–10.5)
Chloride: 106 mEq/L (ref 96–112)
Creatinine, Ser: 1 mg/dL (ref 0.4–1.2)
Glucose, Bld: 96 mg/dL (ref 70–99)

## 2013-05-14 LAB — URINALYSIS, ROUTINE W REFLEX MICROSCOPIC
Hgb urine dipstick: NEGATIVE
Nitrite: NEGATIVE
Specific Gravity, Urine: >=1.03 (ref 1.000–1.030)
Urobilinogen, UA: 0.2 (ref 0.0–1.0)

## 2013-05-14 LAB — LIPID PANEL
Cholesterol: 161 mg/dL (ref 0–200)
HDL: 42.1 mg/dL (ref >39.00)
Total CHOL/HDL Ratio: 4
Triglycerides: 79 mg/dL (ref 0.0–149.0)

## 2013-05-14 LAB — CBC WITH DIFFERENTIAL/PLATELET
Basophils Absolute: 0.1 10*3/uL (ref 0.0–0.1)
Basophils Relative: 1 % (ref 0.0–3.0)
Eosinophils Absolute: 0.5 10*3/uL (ref 0.0–0.7)
Hemoglobin: 13.8 g/dL (ref 12.0–15.0)
Lymphocytes Relative: 30.7 % (ref 12.0–46.0)
MCHC: 34.8 g/dL (ref 30.0–36.0)
Monocytes Relative: 7.4 % (ref 3.0–12.0)
Neutro Abs: 5 10*3/uL (ref 1.4–7.7)
Neutrophils Relative %: 55 % (ref 43.0–77.0)
RBC: 4.51 Mil/uL (ref 3.87–5.11)
RDW: 13.6 % (ref 11.5–14.6)

## 2013-05-14 LAB — HEPATIC FUNCTION PANEL
ALT: 24 U/L (ref 0–35)
Albumin: 3.9 g/dL (ref 3.5–5.2)
Bilirubin, Direct: 0.1 mg/dL (ref 0.0–0.3)
Total Protein: 7.2 g/dL (ref 6.0–8.3)

## 2013-05-14 LAB — HEMOGLOBIN A1C: Hgb A1c MFr Bld: 6.1 % (ref 4.6–6.5)

## 2013-05-14 MED ORDER — HYDROCODONE-HOMATROPINE 5-1.5 MG/5ML PO SYRP: 5.0000 mL | ORAL_SOLUTION | Freq: Four times a day (QID) | ORAL | Status: DC | PRN

## 2013-05-14 MED ORDER — LEVOFLOXACIN 250 MG PO TABS: 250.0000 mg | ORAL_TABLET | Freq: Every day | ORAL | Status: DC

## 2013-05-14 NOTE — Patient Instructions (Signed)
Please take all new medication as prescribed Please continue all other medications as before, and refills have been done if requested. Please have the pharmacy call with any other refills you may need.  Please remember to sign up for My Chart if you have not done so, as this will be important to you in the future with finding out test results, communicating by private email, and scheduling acute appointments online when needed.   

## 2013-05-14 NOTE — Assessment & Plan Note (Signed)
stable overall by history and exam, and pt to continue medical treatment as before,  to f/u any worsening symptoms or concerns 

## 2013-05-14 NOTE — Progress Notes (Signed)
Subjective:    Patient ID: [name], female    DOB: 01-25-1951, 62 y.o.   MRN: [id number]  HPI  Here with acute onset mild to mod 2-3 days ST, HA, general weakness and malaise, with non prod cough sputum, but Pt denies chest pain, increased sob or doe, wheezing, orthopnea, PND, increased LE swelling, palpitations, dizziness or syncope.  Pt denies polydipsia, polyuria.  Does have several wks ongoing nasal allergy symptoms with clearish congestion, itch and sneezing, without fever, pain, ST, cough, swelling or wheezing, but overall very mild symptoms, controlled with current meds Past Medical History  Diagnosis Date  . GERD (gastroesophageal reflux disease)   . Arthritis   . Diverticulosis 02/11/2012  . OVERACTIVE BLADDER 08/10/2007    Qualifier: Diagnosis of  By: Linna Darner, CMA, Cindy    . GERD 08/10/2007    Qualifier: Diagnosis of  By: Linna Darner, CMA, Cindy    . ARTHRITIS, KNEE 02/10/2009    Qualifier: Diagnosis of  By: Alphonzo Severance MD, Lance Muss, ATROPHIC 11/11/2008    Qualifier: Diagnosis of  By: Alphonzo Severance MD, Loni Dolly HEMORRHOIDS-INTERNAL 07/21/2010    Qualifier: Diagnosis of  By: Wilmon Pali NP, Gunnar Fusi    . Cervical cancer   . Allergic rhinitis, cause unspecified   . Chronic LBP   . Obesity   . Degenerative arthritis of hip 02/11/2012  . Cervical disc disease 02/11/2012  . Hyperlipidemia 02/13/2012  . Recurrent UTI   . Hiatal hernia   . GERD (gastroesophageal reflux disease)   . SUI (stress urinary incontinence, female)   . OAB (overactive bladder)   . BRCA negative 03/25/13   Past Surgical History  Procedure Laterality Date  . Cervical discectomy  2003  . Bunionectomy  2007  . Shoulder arthroscopy  2004    frozen shoulder  . Tonsillectomy    . Cystocele repair  2003    rectocele repair  . Vaginal hysterectomy  1986  . Conization of cervix  1982    with D&C    reports that she has never smoked. She has never used smokeless tobacco. She reports that she does not drink  alcohol or use illicit drugs. family history includes Breast cancer in her mother and sister; Cancer in her maternal grandmother; Diabetes in her brother, maternal aunt, and maternal uncle; and Heart disease in her brother and sister. Allergies  Allergen Reactions  . Amoxicillin     REACTION: unspecified  . Penicillins    Current Outpatient Prescriptions on File Prior to Visit  Medication Sig Dispense Refill  . aspirin 81 MG tablet Take 81 mg by mouth daily.        [name] Kitchen atorvastatin (LIPITOR) 10 MG tablet Take 1 tablet (10 mg total) by mouth daily.  90 tablet  3  . cholecalciferol (VITAMIN D) 1000 UNITS tablet Take 1,000 Units by mouth daily.        [name] Kitchen estrogens, conjugated, (PREMARIN) 0.625 MG tablet Take 1 tablet (0.625 mg total) by mouth daily. Take daily for 21 days then do not take for 7 days.  30 tablet  13  . fish oil-omega-3 fatty acids 1000 MG capsule Take 2 g by mouth daily.        [name] Kitchen HYDROcodone-acetaminophen (NORCO/VICODIN) 5-325 MG per tablet Take 1 tablet by mouth every 6 (six) hours as needed for pain.  60 tablet  1  . MULTIPLE VITAMIN tablet Take 1 tablet by mouth daily.      . nabumetone (  RELAFEN) 750 MG tablet Take 1 tablet (750 mg total) by mouth 2 (two) times daily as needed. For arthritis  60 tablet  5  . nitrofurantoin (MACRODANTIN) 100 MG capsule Take 100 mg by mouth.      . pantoprazole (PROTONIX) 40 MG tablet Take 1 tablet (40 mg total) by mouth daily.  30 tablet  11  . solifenacin (VESICARE) 10 MG tablet Take 1 tablet (10 mg total) by mouth daily. One po qd  30 tablet  0   No current facility-administered medications on file prior to visit.   Review of Systems  Constitutional: Negative for unexpected weight change, or unusual diaphoresis  HENT: Negative for tinnitus.   Eyes: Negative for photophobia and visual disturbance.  Respiratory: Negative for choking and stridor.   Gastrointestinal: Negative for vomiting and blood in stool.  Genitourinary: Negative for  hematuria and decreased urine volume.  Musculoskeletal: Negative for acute joint swelling Skin: Negative for color change and wound.  Neurological: Negative for tremors and numbness other than noted  Psychiatric/Behavioral: Negative for decreased concentration or  hyperactivity.       Objective:   Physical Exam BP 110/70  Pulse 86  Temp(Src) 98.3 F (36.8 C) (Oral)  Ht 5\' 8"  (1.727 m)  Wt 228 lb 2 oz (103.477 kg)  BMI 34.69 kg/m2  SpO2 97% VS noted, mild ill Constitutional: Pt appears well-developed and well-nourished.  HENT: Head: NCAT.  Right Ear: External ear normal.  Left Ear: External ear normal.  Bilat tm's with mild erythema.  Max sinus areas non tender.  Pharynx with mild erythema, no exudate Eyes: Conjunctivae and EOM are normal. Pupils are equal, round, and reactive to light.  Neck: Normal range of motion. Neck supple.  Cardiovascular: Normal rate and regular rhythm.   Pulmonary/Chest: Effort normal and breath sounds normal. - no rales or wheezing  Neurological: Pt is alert. Not confused  Skin: Skin is warm. No erythema.  Psychiatric: Pt behavior is normal. Thought content normal.     Assessment & Plan:

## 2013-05-14 NOTE — Assessment & Plan Note (Signed)
asympt - stable overall by history and exam, recent data reviewed with pt, and pt to continue medical treatment as before,  to f/u any worsening symptoms or concerns Lab Results  Component Value Date   HGBA1C 5.9 05/08/2012

## 2013-05-14 NOTE — Assessment & Plan Note (Signed)
Mild to mod, for antibx course,  to f/u any worsening symptoms or concerns 

## 2013-05-21 ENCOUNTER — Ambulatory Visit (INDEPENDENT_AMBULATORY_CARE_PROVIDER_SITE_OTHER): Payer: Federal, State, Local not specified - PPO | Admitting: Internal Medicine

## 2013-05-21 ENCOUNTER — Encounter: Payer: Self-pay | Admitting: Internal Medicine

## 2013-05-21 VITALS — BP 118/72 | HR 75 | Temp 97.0°F | Ht 68.0 in | Wt 230.5 lb

## 2013-05-21 DIAGNOSIS — Z0000 Encounter for general adult medical examination without abnormal findings: Secondary | ICD-10-CM

## 2013-05-21 MED ORDER — FLUTICASONE PROPIONATE 50 MCG/ACT NA SUSP: 2.0000 | Freq: Every day | NASAL | Status: DC

## 2013-05-21 NOTE — Patient Instructions (Signed)
Your EKG was OK today Please continue all other medications as before, and refills have been done if requested. Please have the pharmacy call with any other refills you may need. Please continue your efforts at being more active, low cholesterol diet, and weight control. You are otherwise up to date with prevention measures today. Please keep your appointments with your specialists as you have planned - your follow up mammogram Please take all new medication as prescribed - the generic flonase for allergies (which can help the cough as well) You can also use OTC allegra as needed for allergies as well Please remember to sign up for My Chart if you have not done so, as this will be important to you in the future with finding out test results, communicating by private email, and scheduling acute appointments online when needed.  Please return in 1 year for your yearly visit, or sooner if needed, with Lab testing done 3-5 days before

## 2013-05-21 NOTE — Progress Notes (Signed)
Subjective:    Patient ID: [name], female    DOB: 1951-01-18, 62 y.o.   MRN: [id number]  HPI  Here for wellness and f/u;  Overall doing ok;  Pt denies CP, worsening SOB, DOE, wheezing, orthopnea, PND, worsening LE edema, palpitations, dizziness or syncope.  Pt denies neurological change such as new headache, facial or extremity weakness.  Pt denies polydipsia, polyuria, or low sugar symptoms. Pt states overall good compliance with treatment and medications, good tolerability, and has been trying to follow lower cholesterol diet.  Pt denies worsening depressive symptoms, suicidal ideation or panic. No fever, night sweats, wt loss, loss of appetite, or other constitutional symptoms.  Pt states good ability with ADL's, has low fall risk, home safety reviewed and adequate, no other significant changes in hearing or vision, and only occasionally active with exercise.  Still with non prod cough from last wk illness, some improved overall, no fever.  Does have several wks ongoing nasal allergy symptoms with clearish congestion, itch and sneezing, without fever, pain, ST,, swelling or wheezing. Past Medical History  Diagnosis Date  . GERD (gastroesophageal reflux disease)   . Arthritis   . Diverticulosis 02/11/2012  . OVERACTIVE BLADDER 08/10/2007    Qualifier: Diagnosis of  By: [name]    . GERD 08/10/2007    Qualifier: Diagnosis of  By: [name]    . ARTHRITIS, KNEE 02/10/2009    Qualifier: Diagnosis of  By: Kelly Severance [name], [name], ATROPHIC 11/11/2008    Qualifier: Diagnosis of  By: Kelly Severance [name], [name] 07/21/2010    Qualifier: Diagnosis of  By: Kelly Pali NP, [name]    . Cervical cancer   . Allergic rhinitis, cause unspecified   . Chronic LBP   . Obesity   . Degenerative arthritis of hip 02/11/2012  . Cervical disc disease 02/11/2012  . Hyperlipidemia 02/13/2012  . Recurrent UTI   . Hiatal hernia   . GERD (gastroesophageal reflux disease)    . SUI (stress urinary incontinence, female)   . OAB (overactive bladder)   . BRCA negative 03/25/13   Past Surgical History  Procedure Laterality Date  . Cervical discectomy  2003  . Bunionectomy  2007  . Shoulder arthroscopy  2004    frozen shoulder  . Tonsillectomy    . Cystocele repair  2003    rectocele repair  . Vaginal hysterectomy  1986  . Conization of cervix  1982    with D&C    reports that she has never smoked. She has never used smokeless tobacco. She reports that she does not drink alcohol or use illicit drugs. family history includes Breast cancer in her mother and sister; Cancer in her maternal grandmother; Diabetes in her brother, maternal aunt, and maternal uncle; and Heart disease in her brother and sister. Allergies  Allergen Reactions  . Amoxicillin     REACTION: unspecified  . Penicillins    Current Outpatient Prescriptions on File Prior to Visit  Medication Sig Dispense Refill  . aspirin 81 MG tablet Take 81 mg by mouth daily.        [name] Kitchen atorvastatin (LIPITOR) 10 MG tablet Take 1 tablet (10 mg total) by mouth daily.  90 tablet  3  . cholecalciferol (VITAMIN D) 1000 UNITS tablet Take 1,000 Units by mouth daily.        [name] Kitchen estrogens, conjugated, (PREMARIN) 0.625 MG tablet Take 1 tablet (0.625 mg total) by mouth daily.  Take daily for 21 days then do not take for 7 days.  30 tablet  13  . fish oil-omega-3 fatty acids 1000 MG capsule Take 2 g by mouth daily.        [name] Kitchen HYDROcodone-acetaminophen (NORCO/VICODIN) 5-325 MG per tablet Take 1 tablet by mouth every 6 (six) hours as needed for pain.  60 tablet  1  . HYDROcodone-homatropine (HYCODAN) 5-1.5 MG/5ML syrup Take 5 mLs by mouth every 6 (six) hours as needed for cough.  120 mL  1  . MULTIPLE VITAMIN tablet Take 1 tablet by mouth daily.      . nabumetone (RELAFEN) 750 MG tablet Take 1 tablet (750 mg total) by mouth 2 (two) times daily as needed. For arthritis  60 tablet  5  . nitrofurantoin (MACRODANTIN) 100 MG capsule  Take 100 mg by mouth.      . pantoprazole (PROTONIX) 40 MG tablet Take 1 tablet (40 mg total) by mouth daily.  30 tablet  11  . solifenacin (VESICARE) 10 MG tablet Take 1 tablet (10 mg total) by mouth daily. One po qd  30 tablet  0   No current facility-administered medications on file prior to visit.   Review of Systems Constitutional: Negative for diaphoresis, activity change, appetite change or unexpected weight change.  HENT: Negative for hearing loss, ear pain, facial swelling, mouth sores and neck stiffness.   Eyes: Negative for pain, redness and visual disturbance.  Respiratory: Negative for shortness of breath and wheezing.   Cardiovascular: Negative for chest pain and palpitations.  Gastrointestinal: Negative for diarrhea, blood in stool, abdominal distention or other pain Genitourinary: Negative for hematuria, flank pain or change in urine volume.  Musculoskeletal: Negative for myalgias and joint swelling.  Skin: Negative for color change and wound.  Neurological: Negative for syncope and numbness. other than noted Hematological: Negative for adenopathy.  Psychiatric/Behavioral: Negative for hallucinations, self-injury, decreased concentration and agitation.      Objective:   Physical Exam BP 118/72  Pulse 75  Temp(Src) 97 F (36.1 C) (Oral)  Ht 5\' 8"  (1.727 m)  Wt 230 lb 8 oz (104.554 kg)  BMI 35.06 kg/m2  SpO2 93% VS noted,  Constitutional: Pt is oriented to person, place, and time. Appears well-developed and well-nourished. [name] Head: Normocephalic and atraumatic.  Right Ear: External ear normal.  Left Ear: External ear normal.  Nose: Nose normal.  Mouth/Throat: Oropharynx is clear and moist.  Eyes: Conjunctivae and EOM are normal. Pupils are equal, round, and reactive to light.  Neck: Normal range of motion. Neck supple. No JVD present. No tracheal deviation present.  Cardiovascular: Normal rate, regular rhythm, normal heart sounds and intact distal pulses.    Pulmonary/Chest: Effort normal and breath sounds normal.  Abdominal: Soft. Bowel sounds are normal. There is no tenderness. No HSM  Musculoskeletal: Normal range of motion. Exhibits no edema.  Lymphadenopathy:  Has no cervical adenopathy.  Neurological: Pt is alert and oriented to person, place, and time. Pt has normal reflexes. No cranial nerve deficit.  Skin: Skin is warm and dry. No rash noted.  Psychiatric:  Has  normal mood and affect. Behavior is normal.     Assessment & Plan:

## 2013-05-21 NOTE — Assessment & Plan Note (Addendum)

## 2013-05-23 ENCOUNTER — Other Ambulatory Visit: Payer: Self-pay | Admitting: Internal Medicine

## 2013-05-23 MED ORDER — HYDROCODONE-ACETAMINOPHEN 5-325 MG PO TABS: 1.0000 | ORAL_TABLET | Freq: Four times a day (QID) | ORAL | Status: DC | PRN

## 2013-05-23 MED ORDER — HYDROCODONE-HOMATROPINE 5-1.5 MG/5ML PO SYRP: 5.0000 mL | ORAL_SOLUTION | Freq: Four times a day (QID) | ORAL | Status: DC | PRN

## 2013-05-23 NOTE — Telephone Encounter (Signed)
Pt is requesting a refill on Hydrocodone-homatropine cough syrup. [facility] on [address].

## 2013-05-23 NOTE — Telephone Encounter (Signed)
Done hardcopy to robin  

## 2013-05-24 NOTE — Telephone Encounter (Signed)
Faxed hardcopy's to [facility] [place]

## 2013-07-09 ENCOUNTER — Other Ambulatory Visit: Payer: Self-pay | Admitting: Internal Medicine

## 2013-07-10 NOTE — Telephone Encounter (Signed)
Don erx 

## 2013-07-16 ENCOUNTER — Telehealth: Payer: Self-pay

## 2013-07-16 NOTE — Telephone Encounter (Signed)
Received PA Approval for Protonix valid from 05/11/2013 through 07/12/2014.  Letter will be mailed to the patient.

## 2013-08-04 ENCOUNTER — Emergency Department (HOSPITAL_COMMUNITY)
Admission: EM | Admit: 2013-08-04 | Discharge: 2013-08-05 | Disposition: A | Discharge status: Home / Self Care | Payer: Federal, State, Local not specified - PPO | Attending: Emergency Medicine | Admitting: Emergency Medicine

## 2013-08-04 ENCOUNTER — Encounter (HOSPITAL_COMMUNITY): Payer: Self-pay | Admitting: *Deleted

## 2013-08-04 DIAGNOSIS — K8050 Calculus of bile duct without cholangitis or cholecystitis without obstruction: Secondary | ICD-10-CM

## 2013-08-04 DIAGNOSIS — Z7982 Long term (current) use of aspirin: Secondary | ICD-10-CM | POA: Insufficient documentation

## 2013-08-04 DIAGNOSIS — M1610 Unilateral primary osteoarthritis, unspecified hip: Secondary | ICD-10-CM | POA: Insufficient documentation

## 2013-08-04 DIAGNOSIS — R141 Gas pain: Secondary | ICD-10-CM | POA: Insufficient documentation

## 2013-08-04 DIAGNOSIS — K8020 Calculus of gallbladder without cholecystitis without obstruction: Secondary | ICD-10-CM

## 2013-08-04 DIAGNOSIS — M1710 Unilateral primary osteoarthritis, unspecified knee: Secondary | ICD-10-CM | POA: Insufficient documentation

## 2013-08-04 DIAGNOSIS — K828 Other specified diseases of gallbladder: Secondary | ICD-10-CM | POA: Insufficient documentation

## 2013-08-04 DIAGNOSIS — IMO0002 Reserved for concepts with insufficient information to code with codable children: Secondary | ICD-10-CM | POA: Insufficient documentation

## 2013-08-04 DIAGNOSIS — M169 Osteoarthritis of hip, unspecified: Secondary | ICD-10-CM | POA: Insufficient documentation

## 2013-08-04 DIAGNOSIS — R112 Nausea with vomiting, unspecified: Secondary | ICD-10-CM | POA: Insufficient documentation

## 2013-08-04 DIAGNOSIS — E785 Hyperlipidemia, unspecified: Secondary | ICD-10-CM | POA: Insufficient documentation

## 2013-08-04 DIAGNOSIS — M5090 Cervical disc disorder, unspecified, unspecified cervical region: Secondary | ICD-10-CM | POA: Insufficient documentation

## 2013-08-04 DIAGNOSIS — Z79899 Other long term (current) drug therapy: Secondary | ICD-10-CM | POA: Insufficient documentation

## 2013-08-04 DIAGNOSIS — Z880 Allergy status to penicillin: Secondary | ICD-10-CM | POA: Insufficient documentation

## 2013-08-04 DIAGNOSIS — R142 Eructation: Secondary | ICD-10-CM | POA: Insufficient documentation

## 2013-08-04 DIAGNOSIS — K219 Gastro-esophageal reflux disease without esophagitis: Secondary | ICD-10-CM | POA: Insufficient documentation

## 2013-08-04 DIAGNOSIS — G8929 Other chronic pain: Secondary | ICD-10-CM | POA: Insufficient documentation

## 2013-08-04 DIAGNOSIS — K449 Diaphragmatic hernia without obstruction or gangrene: Secondary | ICD-10-CM | POA: Insufficient documentation

## 2013-08-04 LAB — COMPREHENSIVE METABOLIC PANEL
ALT: 225 U/L — ABNORMAL HIGH (ref 0–35)
AST: 293 U/L — ABNORMAL HIGH (ref 0–37)
CO2: 24 mEq/L (ref 19–32)
Calcium: 9.3 mg/dL (ref 8.4–10.5)
Chloride: 101 mEq/L (ref 96–112)
Creatinine, Ser: 0.8 mg/dL (ref 0.50–1.10)
GFR calc Af Amer: >90 mL/min (ref >90)
GFR calc non Af Amer: 79 mL/min — ABNORMAL LOW (ref >90)
Glucose, Bld: 154 mg/dL — ABNORMAL HIGH (ref 70–99)
Total Bilirubin: 0.8 mg/dL (ref 0.3–1.2)
Total Protein: 6.9 g/dL (ref 6.0–8.3)

## 2013-08-04 LAB — CBC WITH DIFFERENTIAL/PLATELET
Basophils Absolute: 0 10*3/uL (ref 0.0–0.1)
Eosinophils Absolute: 0 10*3/uL (ref 0.0–0.7)
Eosinophils Relative: 1 % (ref 0–5)
HCT: 37.3 % (ref 36.0–46.0)
Lymphocytes Relative: 18 % (ref 12–46)
Lymphs Abs: 1.6 10*3/uL (ref 0.7–4.0)
MCV: 86.9 fL (ref 78.0–100.0)
Monocytes Absolute: 0.4 10*3/uL (ref 0.1–1.0)
RBC: 4.29 MIL/uL (ref 3.87–5.11)
RDW: 12.6 % (ref 11.5–15.5)
WBC: 8.9 10*3/uL (ref 4.0–10.5)

## 2013-08-04 LAB — POCT I-STAT TROPONIN I: Troponin i, poc: 0 ng/mL (ref 0.00–0.08)

## 2013-08-04 MED ORDER — HYDROMORPHONE HCL PF 1 MG/ML IJ SOLN
1.0000 mg | Freq: Once | INTRAMUSCULAR | Status: AC
  Administered 2013-08-05: 1 mg via INTRAVENOUS
  Filled 2013-08-04: qty 1

## 2013-08-04 MED ORDER — ONDANSETRON HCL 4 MG/2ML IJ SOLN
4.0000 mg | Freq: Once | INTRAMUSCULAR | Status: AC
  Administered 2013-08-05: 4 mg via INTRAVENOUS
  Filled 2013-08-04: qty 2

## 2013-08-04 MED ORDER — PANTOPRAZOLE SODIUM 40 MG IV SOLR
40.0000 mg | Freq: Once | INTRAVENOUS | Status: AC
  Administered 2013-08-04: 40 mg via INTRAVENOUS
  Filled 2013-08-04: qty 40

## 2013-08-04 MED ORDER — GI COCKTAIL ~~LOC~~
30.0000 mL | Freq: Once | ORAL | Status: AC
  Administered 2013-08-04: 30 mL via ORAL
  Filled 2013-08-04: qty 30

## 2013-08-04 MED ORDER — SODIUM CHLORIDE 0.9 % IV BOLUS (SEPSIS)
1000.0000 mL | Freq: Once | INTRAVENOUS | Status: AC
  Administered 2013-08-04: 1000 mL via INTRAVENOUS

## 2013-08-04 NOTE — ED Notes (Signed)
Pt states that she began with burning and pain to upper abd around 6pm; pt reports that she has vomited x 3 times; pt c/o nausea; pt with hx of hiatal hernia; pt states "I just can't get comfortable"

## 2013-08-05 ENCOUNTER — Emergency Department (HOSPITAL_COMMUNITY): Payer: Federal, State, Local not specified - PPO

## 2013-08-05 LAB — URINALYSIS, ROUTINE W REFLEX MICROSCOPIC
Bilirubin Urine: NEGATIVE
Glucose, UA: NEGATIVE mg/dL
Hgb urine dipstick: NEGATIVE
Ketones, ur: NEGATIVE mg/dL
Leukocytes, UA: NEGATIVE
Nitrite: NEGATIVE
Protein, ur: NEGATIVE mg/dL
Specific Gravity, Urine: 1.024 (ref 1.005–1.030)
Urobilinogen, UA: 1 mg/dL (ref 0.0–1.0)
pH: 8 (ref 5.0–8.0)

## 2013-08-05 MED ORDER — HYDROCODONE-ACETAMINOPHEN 2.5-500 MG PO TABS: 1.0000 | ORAL_TABLET | Freq: Four times a day (QID) | ORAL | Status: DC | PRN

## 2013-08-05 MED ORDER — ONDANSETRON HCL 4 MG PO TABS: 4.0000 mg | ORAL_TABLET | Freq: Four times a day (QID) | ORAL | Status: DC

## 2013-08-05 NOTE — ED Provider Notes (Signed)
CSN: [id number]     Arrival date & time 08/04/13  2115 History   First MD Initiated Contact with Patient 08/04/13 2156     Chief Complaint  Patient presents with  . Abdominal Pain  . Emesis   (Consider location/radiation/quality/duration/timing/severity/associated sxs/prior Treatment) HPI Comments: Patient here with burning epigastric pain since about 1800 - she reports that this preceeded vomiting x 3 right in a row but none since then.  She reports continued nausea, she has a history of hiatal hernia but reports no previous abdominal surgeries.  She states that she initially thought this was gas and so she took a gas pill - states this did not help.  States then the 3 episodes of vomiting and the continued pain with nausea.  She reports 3 soft normal bowel movements tonight but denies blood in either the vomitus or the bowel movement.    Patient is a 62 y.o. female presenting with abdominal pain. The history is provided by the patient and the spouse. No language interpreter was used.  Abdominal Pain Pain location:  Epigastric Pain quality: bloating, burning and gnawing   Pain radiates to:  Does not radiate Pain severity:  Severe Onset quality:  Gradual Duration:  5 hours Timing:  Constant Progression:  Worsening Chronicity:  New Context: not alcohol use, not diet changes, not laxative use, not recent illness, not recent travel, not sick contacts and not suspicious food intake   Relieved by:  Nothing Worsened by:  Nothing tried Ineffective treatments:  Antacids and belching Associated symptoms: belching, nausea and vomiting   Associated symptoms: no anorexia, no chills, no constipation, no diarrhea, no fever, no flatus, no hematochezia and no hematuria   Risk factors: no alcohol abuse, no NSAID use and no recent hospitalization     Past Medical History  Diagnosis Date  . GERD (gastroesophageal reflux disease)   . Arthritis   . Diverticulosis 02/11/2012  . OVERACTIVE BLADDER  08/10/2007    Qualifier: Diagnosis of  By: Linna Darner, CMA, Cindy    . GERD 08/10/2007    Qualifier: Diagnosis of  By: Linna Darner, CMA, Cindy    . ARTHRITIS, KNEE 02/10/2009    Qualifier: Diagnosis of  By: Alphonzo Severance MD, Lance Muss, ATROPHIC 11/11/2008    Qualifier: Diagnosis of  By: Alphonzo Severance MD, Loni Dolly HEMORRHOIDS-INTERNAL 07/21/2010    Qualifier: Diagnosis of  By: Wilmon Pali NP, Gunnar Fusi    . Cervical cancer   . Allergic rhinitis, cause unspecified   . Chronic LBP   . Obesity   . Degenerative arthritis of hip 02/11/2012  . Cervical disc disease 02/11/2012  . Hyperlipidemia 02/13/2012  . Recurrent UTI   . Hiatal hernia   . GERD (gastroesophageal reflux disease)   . SUI (stress urinary incontinence, female)   . OAB (overactive bladder)   . BRCA negative 03/25/13   Past Surgical History  Procedure Laterality Date  . Cervical discectomy  2003  . Bunionectomy  2007  . Shoulder arthroscopy  2004    frozen shoulder  . Tonsillectomy    . Cystocele repair  2003    rectocele repair  . Vaginal hysterectomy  1986  . Conization of cervix  1982    with D&C   Family History  Problem Relation Age of Onset  . Breast cancer Mother   . Heart disease Sister   . Breast cancer Sister   . Diabetes Brother   . Heart disease Brother   . Diabetes  Maternal Aunt   . Diabetes Maternal Uncle   . Cancer Maternal Grandmother     uterine   History  Substance Use Topics  . Smoking status: Never Smoker   . Smokeless tobacco: Never Used  . Alcohol Use: No     Comment: occassional drink   OB History   Grav Para Term Preterm Abortions TAB SAB Ect Mult Living   1 1 1       1      Review of Systems  Constitutional: Negative for fever and chills.  Gastrointestinal: Positive for nausea, vomiting and abdominal pain. Negative for diarrhea, constipation, hematochezia, anorexia and flatus.  Genitourinary: Negative for hematuria.  All other systems reviewed and are negative.    Allergies    Amoxicillin and Penicillins  Home Medications   Current Outpatient Rx  Name  Route  Sig  Dispense  Refill  . aspirin 81 MG tablet   Oral   Take 81 mg by mouth 3 (three) times a week. Monday Wednesday Friday         . darifenacin (ENABLEX) 15 MG 24 hr tablet   Oral   Take 15 mg by mouth daily.         . fluticasone (FLONASE) 50 MCG/ACT nasal spray   Nasal   Place 2 sprays into the nose daily.   16 g   5   . MULTIPLE VITAMIN tablet   Oral   Take 1 tablet by mouth daily.         . nabumetone (RELAFEN) 750 MG tablet      Take one tablet by mouth twice daily as needed  for arthritis   60 tablet   4   . nitrofurantoin, macrocrystal-monohydrate, (MACROBID) 100 MG capsule      Take one capsule by mouth one time daily   30 capsule   11   . pantoprazole (PROTONIX) 40 MG tablet      Take one tablet by mouth one time daily   30 tablet   11   . estrogens, conjugated, (PREMARIN) 0.625 MG tablet   Oral   Take 1 tablet (0.625 mg total) by mouth daily. Take daily for 21 days then do not take for 7 days.   30 tablet   13   . solifenacin (VESICARE) 10 MG tablet   Oral   Take 1 tablet (10 mg total) by mouth daily. One po qd   30 tablet   0     Lot [id number] exp 12/27/14 4 boxes    BP 160/78  Pulse 69  Temp(Src) 98.1 F (36.7 C) (Oral)  Resp 17  Ht 5\' 8"  (1.727 m)  Wt 234 lb (106.142 kg)  BMI 35.59 kg/m2  SpO2 97% Physical Exam  Nursing note and vitals reviewed. Constitutional: She is oriented to person, place, and time. She appears well-developed and well-nourished. No distress.  HENT:  Head: Normocephalic and atraumatic.  Right Ear: External ear normal.  Left Ear: External ear normal.  Nose: Nose normal.  Mouth/Throat: Oropharynx is clear and moist. No oropharyngeal exudate.  Eyes: Conjunctivae are normal. Pupils are equal, round, and reactive to light. No scleral icterus.  Neck: Normal range of motion. Neck supple.  Cardiovascular: Normal rate, regular  rhythm and normal heart sounds.  Exam reveals no gallop and no friction rub.   No murmur heard. Pulmonary/Chest: Effort normal and breath sounds normal. No respiratory distress. She has no wheezes. She has no rales. She exhibits no tenderness.  Abdominal: Soft.  Bowel sounds are normal. She exhibits no distension and no mass. There is tenderness. There is no rebound, no guarding and negative Murphy's sign.    Musculoskeletal: Normal range of motion. She exhibits no edema and no tenderness.  Lymphadenopathy:    She has no cervical adenopathy.  Neurological: She is alert and oriented to person, place, and time. She exhibits normal muscle tone. Coordination normal.  Skin: Skin is warm and dry. No rash noted. No erythema. No pallor.  Psychiatric: She has a normal mood and affect. Her behavior is normal. Judgment and thought content normal.    ED Course  Procedures (including critical care time) Labs Review Labs Reviewed  COMPREHENSIVE METABOLIC PANEL - Abnormal; Notable for the following:    Glucose, Bld 154 (*)    Albumin 3.4 (*)    AST 293 (*)    ALT 225 (*)    Alkaline Phosphatase 162 (*)    GFR calc non Af Amer 79 (*)    All other components within normal limits  URINALYSIS, ROUTINE W REFLEX MICROSCOPIC - Abnormal; Notable for the following:    APPearance CLOUDY (*)    All other components within normal limits  CBC WITH DIFFERENTIAL  LIPASE, BLOOD  POCT I-STAT TROPONIN I   Imaging Review No results found.  Date: 08/05/2013  Rate: 69  Rhythm: sinus  QRS Axis: normal  Intervals: RVH  ST/T Wave abnormalities: Non specific t wave changes in anterior leads  Conduction Disutrbances:none  Narrative Interpretation: EKG reviewed by Dr. Radford Pax  Old EKG Reviewed: No acute changes noted from ECG dated 05/21/13 Results for orders placed during the hospital encounter of 08/04/13  CBC WITH DIFFERENTIAL      Result Value Range   WBC 8.9  4.0 - 10.5 K/uL   RBC 4.29  3.87 - 5.11 MIL/uL    Hemoglobin 13.0  12.0 - 15.0 g/dL   HCT 16.1  09.6 - 04.5 %   MCV 86.9  78.0 - 100.0 fL   MCH 30.3  26.0 - 34.0 pg   MCHC 34.9  30.0 - 36.0 g/dL   RDW 40.9  81.1 - 91.4 %   Platelets 204  150 - 400 K/uL   Neutrophils Relative % 77  43 - 77 %   Neutro Abs 6.8  1.7 - 7.7 K/uL   Lymphocytes Relative 18  12 - 46 %   Lymphs Abs 1.6  0.7 - 4.0 K/uL   Monocytes Relative 5  3 - 12 %   Monocytes Absolute 0.4  0.1 - 1.0 K/uL   Eosinophils Relative 1  0 - 5 %   Eosinophils Absolute 0.0  0.0 - 0.7 K/uL   Basophils Relative 0  0 - 1 %   Basophils Absolute 0.0  0.0 - 0.1 K/uL  COMPREHENSIVE METABOLIC PANEL      Result Value Range   Sodium 138  135 - 145 mEq/L   Potassium 4.0  3.5 - 5.1 mEq/L   Chloride 101  96 - 112 mEq/L   CO2 24  19 - 32 mEq/L   Glucose, Bld 154 (*) 70 - 99 mg/dL   BUN 19  6 - 23 mg/dL   Creatinine, Ser 7.82  0.50 - 1.10 mg/dL   Calcium 9.3  8.4 - 95.6 mg/dL   Total Protein 6.9  6.0 - 8.3 g/dL   Albumin 3.4 (*) 3.5 - 5.2 g/dL   AST 213 (*) 0 - 37 U/L   ALT 225 (*) 0 - 35  U/L   Alkaline Phosphatase 162 (*) 39 - 117 U/L   Total Bilirubin 0.8  0.3 - 1.2 mg/dL   GFR calc non Af Amer 79 (*) >90 mL/min   GFR calc Af Amer >90  >90 mL/min  LIPASE, BLOOD      Result Value Range   Lipase 46  11 - 59 U/L  URINALYSIS, ROUTINE W REFLEX MICROSCOPIC      Result Value Range   Color, Urine YELLOW  YELLOW   APPearance CLOUDY (*) CLEAR   Specific Gravity, Urine 1.024  1.005 - 1.030   pH 8.0  5.0 - 8.0   Glucose, UA NEGATIVE  NEGATIVE mg/dL   Hgb urine dipstick NEGATIVE  NEGATIVE   Bilirubin Urine NEGATIVE  NEGATIVE   Ketones, ur NEGATIVE  NEGATIVE mg/dL   Protein, ur NEGATIVE  NEGATIVE mg/dL   Urobilinogen, UA 1.0  0.0 - 1.0 mg/dL   Nitrite NEGATIVE  NEGATIVE   Leukocytes, UA NEGATIVE  NEGATIVE  POCT I-STAT TROPONIN I      Result Value Range   Troponin i, poc 0.00  0.00 - 0.08 ng/mL   Comment 3            US Abdomen Complete  08/05/2013   CLINICAL DATA:  Abdominal pain,  vomiting, elevated LFTs.  EXAM: ULTRASOUND ABDOMEN COMPLETE  COMPARISON:  None.  FINDINGS: Gallbladder  Multiple calcifications in the gallbladder compatible with small stones. Ring down artifact from the anterior wall compatible with adenomyomatosis. Negative sonographic Murphy's.  Common bile duct  Diameter: Normal caliber at 2 mm.  Liver  Heterogeneous, increased echotexture throughout the liver compatible with fatty infiltration. 3 cm hypoechoic area may reflect focal fatty sparing. No biliary ductal dilatation.  IVC  No abnormality visualized.  Pancreas  Difficult to visualize due to overlying bowel gas.  Spleen  Size and appearance within normal limits.  Right Kidney  Length: 10.9 cm. Echogenicity within normal limits. No mass or hydronephrosis visualized.  Left Kidney  Length: 9.7 cm. Echogenicity within normal limits. No mass or hydronephrosis visualized.  Abdominal aorta  No aneurysm visualized.  IMPRESSION: Cholelithiasis. Adenomyomatosis.  Fatty infiltration of the liver.   Electronically Signed   By: Charlett Nose M.D.   On: 08/05/2013 01:47    1:56 AM Reports no futher pain after administration of dilaudid.  MDM  Biliary Colic Adenomyomatosis Cholelithiasis  Patient here with sudden onset of epigastric pain and vomiting - no history of gallstones in the past, pain controlled with pain medication here.  Although LFT's are elevated, there is no evidence of cholecystitis.  Patient will follow up with PCP this week.  Will also refer to surgery.  Patient instructed to return to ER with pain not relieved with medication, nausea, vomiting.   Izola Price Marisue Humble, PA-C 08/05/13 0159

## 2013-08-05 NOTE — ED Notes (Signed)
Patient is alert and oriented x3.  She was given DC instructions and follow up visit instructions.  Patient gave verbal understanding. She was DC ambulatory under her own power to home.  V/S stable.  He was not showing any signs of distress on DC 

## 2013-08-07 ENCOUNTER — Ambulatory Visit (INDEPENDENT_AMBULATORY_CARE_PROVIDER_SITE_OTHER): Payer: Federal, State, Local not specified - PPO | Admitting: Internal Medicine

## 2013-08-07 ENCOUNTER — Encounter: Payer: Self-pay | Admitting: Internal Medicine

## 2013-08-07 VITALS — BP 110/80 | HR 91 | Temp 97.5°F | Ht 68.0 in | Wt 229.0 lb

## 2013-08-07 DIAGNOSIS — R7309 Other abnormal glucose: Secondary | ICD-10-CM

## 2013-08-07 DIAGNOSIS — Z23 Encounter for immunization: Secondary | ICD-10-CM

## 2013-08-07 DIAGNOSIS — R7302 Impaired glucose tolerance (oral): Secondary | ICD-10-CM

## 2013-08-07 DIAGNOSIS — K8020 Calculus of gallbladder without cholecystitis without obstruction: Secondary | ICD-10-CM | POA: Insufficient documentation

## 2013-08-07 DIAGNOSIS — K8021 Calculus of gallbladder without cholecystitis with obstruction: Secondary | ICD-10-CM

## 2013-08-07 DIAGNOSIS — K8050 Calculus of bile duct without cholangitis or cholecystitis without obstruction: Secondary | ICD-10-CM | POA: Insufficient documentation

## 2013-08-07 NOTE — Progress Notes (Signed)
Subjective:    Patient ID: [name], female    DOB: [birth year], 62 y.o.   MRN: [id number]  HPI  Here to f/u ER visit with biliary colic.  Unfort has still persistent pain, u/s c/w gallstones, mild GB swelling, and her pain has persisted though milder, radiating to the back.  No fever, n/v.  Pt denies chest pain, increased sob or doe, wheezing, orthopnea, PND, increased LE swelling, palpitations, dizziness or syncope.  Pt denies new neurological symptoms such as new headache, or facial or extremity weakness or numbness   Pt denies polydipsia, polyuria, or low sugar symptoms Pt states overall good compliance with meds,  Planning trip to [place] oct 19 - 31 for family wedding Past Medical History  Diagnosis Date  . GERD (gastroesophageal reflux disease)   . Arthritis   . Diverticulosis 02/11/2012  . OVERACTIVE BLADDER 08/10/2007    Qualifier: Diagnosis of  By: Linna Darner, CMA, Cindy    . GERD 08/10/2007    Qualifier: Diagnosis of  By: Linna Darner, CMA, Cindy    . ARTHRITIS, KNEE 02/10/2009    Qualifier: Diagnosis of  By: Alphonzo Severance MD, Lance Muss, ATROPHIC 11/11/2008    Qualifier: Diagnosis of  By: Alphonzo Severance MD, Loni Dolly HEMORRHOIDS-INTERNAL 07/21/2010    Qualifier: Diagnosis of  By: Wilmon Pali NP, Gunnar Fusi    . Cervical cancer   . Allergic rhinitis, cause unspecified   . Chronic LBP   . Obesity   . Degenerative arthritis of hip 02/11/2012  . Cervical disc disease 02/11/2012  . Hyperlipidemia 02/13/2012  . Recurrent UTI   . Hiatal hernia   . GERD (gastroesophageal reflux disease)   . SUI (stress urinary incontinence, female)   . OAB (overactive bladder)   . BRCA negative 03/25/13   Past Surgical History  Procedure Laterality Date  . Cervical discectomy  2003  . Bunionectomy  2007  . Shoulder arthroscopy  2004    frozen shoulder  . Tonsillectomy    . Cystocele repair  2003    rectocele repair  . Vaginal hysterectomy  1986  . Conization of cervix  1982    with D&C    reports that she  has never smoked. She has never used smokeless tobacco. She reports that  drinks alcohol. She reports that she does not use illicit drugs. family history includes Breast cancer in her mother and sister; Cancer in her maternal grandmother; Diabetes in her brother, maternal aunt, and maternal uncle; Heart disease in her brother and sister. Allergies  Allergen Reactions  . Amoxicillin     REACTION: unspecified  . Penicillins Other (See Comments)    Unknown reaction   Current Outpatient Prescriptions on File Prior to Visit  Medication Sig Dispense Refill  . aspirin 81 MG tablet Take 81 mg by mouth 3 (three) times a week. Monday Wednesday Friday      . darifenacin (ENABLEX) 15 MG 24 hr tablet Take 15 mg by mouth daily.      [name] Kitchen estrogens, conjugated, (PREMARIN) 0.625 MG tablet Take 1 tablet (0.625 mg total) by mouth daily. Take daily for 21 days then do not take for 7 days.  30 tablet  13  . HYDROcodone-acetaminophen (VICODIN) 2.5-500 MG per tablet Take 1 tablet by mouth every 6 (six) hours as needed for pain.  30 tablet  0  . MULTIPLE VITAMIN tablet Take 1 tablet by mouth daily.      . nabumetone (RELAFEN) 750 MG tablet Take  one tablet by mouth twice daily as needed  for arthritis  60 tablet  4  . nitrofurantoin, macrocrystal-monohydrate, (MACROBID) 100 MG capsule Take one capsule by mouth one time daily  30 capsule  11  . pantoprazole (PROTONIX) 40 MG tablet Take one tablet by mouth one time daily  30 tablet  11  . solifenacin (VESICARE) 10 MG tablet Take 1 tablet (10 mg total) by mouth daily. One po qd  30 tablet  0   No current facility-administered medications on file prior to visit.   Review of Systems  Constitutional: Negative for unexpected weight change, or unusual diaphoresis  HENT: Negative for tinnitus.   Eyes: Negative for photophobia and visual disturbance.  Respiratory: Negative for choking and stridor.   Gastrointestinal: Negative for vomiting and blood in stool.  Genitourinary:  Negative for hematuria and decreased urine volume.  Musculoskeletal: Negative for acute joint swelling Skin: Negative for color change and wound.  Neurological: Negative for tremors and numbness other than noted  Psychiatric/Behavioral: Negative for decreased concentration or  hyperactivity.       Objective:   Physical Exam BP 110/80  Pulse 91  Temp(Src) 97.5 F (36.4 C) (Oral)  Ht 5\' 8"  (1.727 m)  Wt 229 lb (103.874 kg)  BMI 34.83 kg/m2  SpO2 95% VS noted,  Constitutional: Pt appears well-developed and well-nourished.  HENT: Head: NCAT.  Right Ear: External ear normal.  Left Ear: External ear normal.  Eyes: Conjunctivae and EOM are normal. Pupils are equal, round, and reactive to light.  Neck: Normal range of motion. Neck supple.  Cardiovascular: Normal rate and regular rhythm.   Pulmonary/Chest: Effort normal and breath sounds normal.  Abd:  Soft, non-distended, + BS with tender RUQ Neurological: Pt is alert. Not confused  Skin: Skin is warm. No erythema.  Psychiatric: Pt behavior is normal. Thought content normal.     Assessment & Plan:

## 2013-08-07 NOTE — Patient Instructions (Addendum)
You had the flu shot today Please continue all other medications as before, and refills have been done if requested. Please have the pharmacy call with any other refills you may need.  You will be contacted regarding the referral for: Dr Orland Dec - asap  Please go to ER for any worsening fever or pain

## 2013-08-07 NOTE — ED Provider Notes (Signed)
Medical screening examination/treatment/procedure(s) were performed by non-physician practitioner and as supervising physician I was immediately available for consultation/collaboration.   Gwyneth Sprout, MD 08/07/13 2035

## 2013-08-09 ENCOUNTER — Encounter (INDEPENDENT_AMBULATORY_CARE_PROVIDER_SITE_OTHER): Payer: Self-pay | Admitting: Surgery

## 2013-08-09 ENCOUNTER — Ambulatory Visit (INDEPENDENT_AMBULATORY_CARE_PROVIDER_SITE_OTHER): Payer: Federal, State, Local not specified - PPO | Admitting: Surgery

## 2013-08-09 VITALS — BP 126/74 | HR 68 | Temp 97.0°F | Resp 14 | Ht 68.0 in | Wt 231.2 lb

## 2013-08-09 DIAGNOSIS — K8020 Calculus of gallbladder without cholecystitis without obstruction: Secondary | ICD-10-CM

## 2013-08-09 NOTE — Progress Notes (Signed)
Re:   [name] DOB:   12-12-1950 MRN:   [id number]  ASSESSMENT AND PLAN: 1.  Gallstones  Symptomatic.  I discussed with the patient the indications and risks of gall bladder surgery.  The primary risks of gall bladder surgery include, but are not limited to, bleeding, infection, common bile duct injury, and open surgery.  There is also the risk that the patient may have continued symptoms after surgery.  However, the likelihood of improvement in symptoms and return to the patient's normal status is good. We discussed the typical post-operative recovery course. I tried to answer the patient's questions.  I gave the patient literature about gall bladder surgery.  We talked about the options of having her surgery before she goes to [place] vs when she come back.  We're going to try to do her surgery next week.  1b.  Elevated LFT's - AST - 293, ALT - 225, Alk phos - 162 on 08/04/2013.  Presumably secondary to gall bladder disease.  T. Bili normal. Will repeat prior to surgery.  2.  Overactive bladder - 3 prior bladder tacks. 3.  GERD - with HH 4.  Prior neck surgery - still some numbness in left hand  Saw Dr. Judie Petit. Roy 5.  Arthritis both hips. 6.  Obese.  BMI - 35.  Chief Complaint  Patient presents with  . New Evaluation    eval GB   REFERRING PHYSICIAN: Oliver Barre, MD  HISTORY OF PRESENT ILLNESS: [name] is a 62 y.o. (DOB: [birth year])  white  female whose primary care physician is Oliver Barre, MD and comes to me today for gall bladder disease.  She had no trouble before Sunday, 08/04/2013.  She started having some discomfort around 2 PM.  By [place], she was vomiting.  She went to the [facility] ER that night.  A GI cocktail did not help her symptoms.  Her symptoms lasted about 12 hours. She went on for an US abdomen on Monday,  08/05/2013, which showed cholelithiasis. She had no jaundice, no fever, and after her attack, she was sore for a day, but has done better. She has a history of a hiatal  hernia with some reflux, she is on Protonix.  She had a colonoscopy by Dr. Russella Dar in 10/2010, which was negative except for some minimal diverticulosis. She has a sister and 3 nieces who have had gall bladder surgery.   Past Medical History  Diagnosis Date  . GERD (gastroesophageal reflux disease)   . Arthritis   . Diverticulosis 02/11/2012  . OVERACTIVE BLADDER 08/10/2007    Qualifier: Diagnosis of  By: Linna Darner, CMA, Cindy    . GERD 08/10/2007    Qualifier: Diagnosis of  By: Linna Darner, CMA, Cindy    . ARTHRITIS, KNEE 02/10/2009    Qualifier: Diagnosis of  By: Alphonzo Severance MD, Lance Muss, ATROPHIC 11/11/2008    Qualifier: Diagnosis of  By: Alphonzo Severance MD, Loni Dolly HEMORRHOIDS-INTERNAL 07/21/2010    Qualifier: Diagnosis of  By: Wilmon Pali NP, Gunnar Fusi    . Cervical cancer   . Allergic rhinitis, cause unspecified   . Chronic LBP   . Obesity   . Degenerative arthritis of hip 02/11/2012  . Cervical disc disease 02/11/2012  . Hyperlipidemia 02/13/2012  . Recurrent UTI   . Hiatal hernia   . GERD (gastroesophageal reflux disease)   . SUI (stress urinary incontinence, female)   . OAB (overactive bladder)   . BRCA negative  03/25/13      Past Surgical History  Procedure Laterality Date  . Cervical discectomy  2003  . Bunionectomy  2007  . Shoulder arthroscopy  2004    frozen shoulder  . Tonsillectomy    . Cystocele repair  2003    rectocele repair  . Vaginal hysterectomy  1986  . Conization of cervix  1982    with D&C    Current Outpatient Prescriptions  Medication Sig Dispense Refill  . aspirin 81 MG tablet Take 81 mg by mouth 3 (three) times a week. Monday Wednesday Friday      . darifenacin (ENABLEX) 15 MG 24 hr tablet Take 15 mg by mouth daily.      [name] Kitchen estrogens, conjugated, (PREMARIN) 0.625 MG tablet Take 1 tablet (0.625 mg total) by mouth daily. Take daily for 21 days then do not take for 7 days.  30 tablet  13  . HYDROcodone-acetaminophen (VICODIN) 2.5-500 MG per tablet Take 1  tablet by mouth every 6 (six) hours as needed for pain.  30 tablet  0  . MULTIPLE VITAMIN tablet Take 1 tablet by mouth daily.      . nabumetone (RELAFEN) 750 MG tablet Take one tablet by mouth twice daily as needed  for arthritis  60 tablet  4  . nitrofurantoin, macrocrystal-monohydrate, (MACROBID) 100 MG capsule Take one capsule by mouth one time daily  30 capsule  11  . pantoprazole (PROTONIX) 40 MG tablet Take one tablet by mouth one time daily  30 tablet  11  . solifenacin (VESICARE) 10 MG tablet Take 1 tablet (10 mg total) by mouth daily. One po qd  30 tablet  0   No current facility-administered medications for this visit.      Allergies  Allergen Reactions  . Amoxicillin     REACTION: unspecified  . Penicillins Other (See Comments)    Unknown reaction    REVIEW OF SYSTEMS: Skin:  No history of rash.  No history of abnormal moles. Infection:  No history of hepatitis or HIV.  No history of MRSA. Neurologic:  No history of stroke.  No history of seizure.  No history of headaches. Cardiac:  No history of hypertension. No history of heart disease.  No history of prior cardiac catheterization.  No history of seeing a cardiologist. Pulmonary:  Does not smoke cigarettes.  No asthma or bronchitis.  No OSA/CPAP. Breasts:  Grandmother had ovarian ca at age 78. Mother and sister with breast cancer. She went through BRCA testing in May 2014, which was negative.  Endocrine:  No diabetes. HgbA1C - 5. On 05/08/2012. No thyroid disease. Gastrointestinal:  See HPI.  No history of stomach disease.  No history of liver disease.  No history of pancreas disease.  No history of colon disease. Urologic:  Urinary incontinence.  She has had 3 bladder tacks.  Has seen Dr. Marye Round. GYN:  Vaginal hysterectomy in 1986 for positive Pap smears/prior cervical surgery. Musculoskeletal:  Right shoulder surgery for "frozen" shoulder - 2004.  Neck surgery by Dr. Channing Mutters in 2003.  Still has some hand numbness.  She has  "arthritis" of both hips. Hematologic:  On baby aspirin. Psycho-social:  The patient is oriented.   The patient has no obvious psychologic or social impairment to understanding our conversation and plan.  SOCIAL and FAMILY HISTORY: Married. Works for [employer].  (in fact, she has a [employer] T shirt on) Has one daughter. She is to go to [place] on  08/25/2013 to see her daughter.  PHYSICAL EXAM: BP 126/74  Pulse 68  Temp(Src) 97 F (36.1 C) (Temporal)  Resp 14  Ht 5\' 8"  (1.727 m)  Wt 231 lb 3.2 oz (104.872 kg)  BMI 35.16 kg/m2  General: WN obese WF who is alert and generally healthy appearing.  HEENT: Normal. Pupils equal. Neck: Supple. No mass.  No thyroid mass. Lymph Nodes:  No supraclavicular or cervical nodes. Lungs: Clear to auscultation and symmetric breath sounds. Heart:  RRR. No murmur or rub. Abdomen: Soft.  No tenderness. No hernia. Normal bowel sounds.  Obese. Lower abdominal scar from bladder tack. Rectal: Not done. Extremities:  Good strength and ROM  in upper and lower extremities. Neurologic:  Grossly intact to motor and sensory function. Psychiatric: Has normal mood and affect. Behavior is normal.   DATA REVIEWED: Epic and [place] report.  Ovidio Kin, MD,  [facility] [address] [place]  [address]   [place], [place]    [address] Phone:  [phone] FAX:  [phone]

## 2013-08-11 NOTE — Assessment & Plan Note (Signed)
stable overall by history and exam, recent data reviewed with pt, and pt to continue medical treatment as before,  to f/u any worsening symptoms or concerns Lab Results  Component Value Date   HGBA1C 6.1 05/14/2013

## 2013-08-11 NOTE — Assessment & Plan Note (Signed)
Likely cause of pain, for gen surgury referral

## 2013-08-11 NOTE — Assessment & Plan Note (Signed)
With persistent symptoms, abnormal exam, abnormal u/s   For gen surgury referral

## 2013-08-12 ENCOUNTER — Encounter (HOSPITAL_COMMUNITY): Payer: Self-pay | Admitting: *Deleted

## 2013-08-12 NOTE — Progress Notes (Signed)
Notified Dr. Algis Downs. Newman's office of PCN allergy and the Ancef order. Spoke with Belenda Cruise, RN and she states she will message Dr. Ezzard Standing and let him know.

## 2013-08-13 ENCOUNTER — Ambulatory Visit (HOSPITAL_COMMUNITY): Payer: Federal, State, Local not specified - PPO | Admitting: Anesthesiology

## 2013-08-13 ENCOUNTER — Encounter (HOSPITAL_COMMUNITY): Payer: Self-pay | Admitting: *Deleted

## 2013-08-13 ENCOUNTER — Ambulatory Visit (HOSPITAL_COMMUNITY): Payer: Federal, State, Local not specified - PPO

## 2013-08-13 ENCOUNTER — Encounter (HOSPITAL_COMMUNITY)
Admission: RE | Disposition: A | Discharge status: Home / Self Care | Payer: Self-pay | Source: Ambulatory Visit | Attending: Surgery

## 2013-08-13 ENCOUNTER — Encounter (HOSPITAL_COMMUNITY): Payer: Self-pay | Admitting: Anesthesiology

## 2013-08-13 ENCOUNTER — Ambulatory Visit (HOSPITAL_COMMUNITY)
Admission: RE | Admit: 2013-08-13 | Discharge: 2013-08-13 | Disposition: A | Discharge status: Home / Self Care | Payer: Federal, State, Local not specified - PPO | Source: Ambulatory Visit | Attending: Surgery | Admitting: Surgery

## 2013-08-13 DIAGNOSIS — N318 Other neuromuscular dysfunction of bladder: Secondary | ICD-10-CM | POA: Insufficient documentation

## 2013-08-13 DIAGNOSIS — K219 Gastro-esophageal reflux disease without esophagitis: Secondary | ICD-10-CM | POA: Insufficient documentation

## 2013-08-13 DIAGNOSIS — E669 Obesity, unspecified: Secondary | ICD-10-CM | POA: Insufficient documentation

## 2013-08-13 DIAGNOSIS — K648 Other hemorrhoids: Secondary | ICD-10-CM | POA: Insufficient documentation

## 2013-08-13 DIAGNOSIS — I059 Rheumatic mitral valve disease, unspecified: Secondary | ICD-10-CM | POA: Insufficient documentation

## 2013-08-13 DIAGNOSIS — Z880 Allergy status to penicillin: Secondary | ICD-10-CM | POA: Insufficient documentation

## 2013-08-13 DIAGNOSIS — K449 Diaphragmatic hernia without obstruction or gangrene: Secondary | ICD-10-CM | POA: Insufficient documentation

## 2013-08-13 DIAGNOSIS — K8010 Calculus of gallbladder with chronic cholecystitis without obstruction: Secondary | ICD-10-CM | POA: Insufficient documentation

## 2013-08-13 DIAGNOSIS — Z6835 Body mass index (BMI) 35.0-35.9, adult: Secondary | ICD-10-CM | POA: Insufficient documentation

## 2013-08-13 DIAGNOSIS — M1610 Unilateral primary osteoarthritis, unspecified hip: Secondary | ICD-10-CM | POA: Insufficient documentation

## 2013-08-13 DIAGNOSIS — K8020 Calculus of gallbladder without cholecystitis without obstruction: Secondary | ICD-10-CM

## 2013-08-13 DIAGNOSIS — M169 Osteoarthritis of hip, unspecified: Secondary | ICD-10-CM | POA: Insufficient documentation

## 2013-08-13 DIAGNOSIS — N952 Postmenopausal atrophic vaginitis: Secondary | ICD-10-CM | POA: Insufficient documentation

## 2013-08-13 DIAGNOSIS — N393 Stress incontinence (female) (male): Secondary | ICD-10-CM | POA: Insufficient documentation

## 2013-08-13 DIAGNOSIS — E785 Hyperlipidemia, unspecified: Secondary | ICD-10-CM | POA: Insufficient documentation

## 2013-08-13 DIAGNOSIS — Z8541 Personal history of malignant neoplasm of cervix uteri: Secondary | ICD-10-CM | POA: Insufficient documentation

## 2013-08-13 DIAGNOSIS — Z01812 Encounter for preprocedural laboratory examination: Secondary | ICD-10-CM | POA: Insufficient documentation

## 2013-08-13 HISTORY — DX: Other specified postprocedural states: Z98.890

## 2013-08-13 HISTORY — DX: Pneumonia, unspecified organism: J18.9

## 2013-08-13 HISTORY — DX: Nausea with vomiting, unspecified: R11.2

## 2013-08-13 HISTORY — PX: CHOLECYSTECTOMY: SHX55

## 2013-08-13 HISTORY — DX: Nonrheumatic mitral (valve) prolapse: I34.1

## 2013-08-13 LAB — CBC WITH DIFFERENTIAL/PLATELET
Basophils Relative: 0 % (ref 0–1)
Eosinophils Absolute: 0.3 10*3/uL (ref 0.0–0.7)
HCT: 37.1 % (ref 36.0–46.0)
Hemoglobin: 12.8 g/dL (ref 12.0–15.0)
Lymphocytes Relative: 43 % (ref 12–46)
MCV: 87.9 fL (ref 78.0–100.0)
Monocytes Absolute: 0.5 10*3/uL (ref 0.1–1.0)
Monocytes Relative: 7 % (ref 3–12)
Neutro Abs: 2.9 10*3/uL (ref 1.7–7.7)
RBC: 4.22 MIL/uL (ref 3.87–5.11)
WBC: 6.2 10*3/uL (ref 4.0–10.5)

## 2013-08-13 LAB — COMPREHENSIVE METABOLIC PANEL
BUN: 17 mg/dL (ref 6–23)
CO2: 24 mEq/L (ref 19–32)
Chloride: 105 mEq/L (ref 96–112)
Creatinine, Ser: 0.85 mg/dL (ref 0.50–1.10)
GFR calc Af Amer: 85 mL/min — ABNORMAL LOW (ref >90)
GFR calc non Af Amer: 74 mL/min — ABNORMAL LOW (ref >90)
Glucose, Bld: 85 mg/dL (ref 70–99)
Total Bilirubin: 0.7 mg/dL (ref 0.3–1.2)

## 2013-08-13 SURGERY — LAPAROSCOPIC CHOLECYSTECTOMY WITH INTRAOPERATIVE CHOLANGIOGRAM
Anesthesia: General | Site: Abdomen | Wound class: Clean Contaminated

## 2013-08-13 MED ORDER — LACTATED RINGERS IV SOLN
INTRAVENOUS | Status: DC | PRN
  Administered 2013-08-13 (×2): via INTRAVENOUS

## 2013-08-13 MED ORDER — LIDOCAINE HCL 4 % MT SOLN
OROMUCOSAL | Status: DC | PRN
  Administered 2013-08-13: 4 mL via TOPICAL

## 2013-08-13 MED ORDER — MIDAZOLAM HCL 5 MG/5ML IJ SOLN
INTRAMUSCULAR | Status: DC | PRN
  Administered 2013-08-13: 1 mg via INTRAVENOUS

## 2013-08-13 MED ORDER — HEMOSTATIC AGENTS (NO CHARGE) OPTIME
TOPICAL | Status: DC | PRN
  Administered 2013-08-13: 1

## 2013-08-13 MED ORDER — NEOSTIGMINE METHYLSULFATE 1 MG/ML IJ SOLN
INTRAMUSCULAR | Status: DC | PRN
  Administered 2013-08-13 (×2): 2 mg via INTRAVENOUS

## 2013-08-13 MED ORDER — HYDROMORPHONE HCL PF 1 MG/ML IJ SOLN
0.2500 mg | INTRAMUSCULAR | Status: DC | PRN
  Administered 2013-08-13 (×2): 0.5 mg via INTRAVENOUS

## 2013-08-13 MED ORDER — CEFAZOLIN SODIUM-DEXTROSE 2-3 GM-% IV SOLR
INTRAVENOUS | Status: AC
  Filled 2013-08-13: qty 50

## 2013-08-13 MED ORDER — LACTATED RINGERS IV SOLN: INTRAVENOUS | Status: DC

## 2013-08-13 MED ORDER — BUPIVACAINE HCL 0.25 % IJ SOLN
INTRAMUSCULAR | Status: DC | PRN
  Administered 2013-08-13: 20 mL
  Administered 2013-08-13: 10 mL

## 2013-08-13 MED ORDER — PROPOFOL 10 MG/ML IV BOLUS
INTRAVENOUS | Status: DC | PRN
  Administered 2013-08-13: 200 mg via INTRAVENOUS

## 2013-08-13 MED ORDER — OXYCODONE HCL 5 MG PO TABS: 5.0000 mg | ORAL_TABLET | Freq: Once | ORAL | Status: AC | PRN

## 2013-08-13 MED ORDER — ONDANSETRON HCL 4 MG/2ML IJ SOLN
INTRAMUSCULAR | Status: DC | PRN
  Administered 2013-08-13: 4 mg via INTRAMUSCULAR

## 2013-08-13 MED ORDER — CHLORHEXIDINE GLUCONATE 4 % EX LIQD: 1.0000 "application " | Freq: Once | CUTANEOUS | Status: DC

## 2013-08-13 MED ORDER — OXYCODONE HCL 5 MG/5ML PO SOLN: 5.0000 mg | Freq: Once | ORAL | Status: AC | PRN

## 2013-08-13 MED ORDER — LACTATED RINGERS IV SOLN
INTRAVENOUS | Status: DC
  Administered 2013-08-13: 09:00:00 via INTRAVENOUS

## 2013-08-13 MED ORDER — FENTANYL CITRATE 0.05 MG/ML IJ SOLN
INTRAMUSCULAR | Status: DC | PRN
  Administered 2013-08-13 (×3): 50 ug via INTRAVENOUS
  Administered 2013-08-13: 100 ug via INTRAVENOUS

## 2013-08-13 MED ORDER — DEXAMETHASONE SODIUM PHOSPHATE 4 MG/ML IJ SOLN
INTRAMUSCULAR | Status: DC | PRN
  Administered 2013-08-13 (×2): 4 mg via INTRAVENOUS

## 2013-08-13 MED ORDER — BUPIVACAINE HCL (PF) 0.25 % IJ SOLN
INTRAMUSCULAR | Status: AC
  Filled 2013-08-13: qty 30

## 2013-08-13 MED ORDER — HYDROMORPHONE HCL PF 1 MG/ML IJ SOLN
INTRAMUSCULAR | Status: AC
  Filled 2013-08-13: qty 1

## 2013-08-13 MED ORDER — SODIUM CHLORIDE 0.9 % IR SOLN
Status: DC | PRN
  Administered 2013-08-13: 1000 mL

## 2013-08-13 MED ORDER — ONDANSETRON HCL 4 MG/2ML IJ SOLN: 4.0000 mg | Freq: Four times a day (QID) | INTRAMUSCULAR | Status: DC | PRN

## 2013-08-13 MED ORDER — SODIUM CHLORIDE 0.9 % IV SOLN
INTRAVENOUS | Status: DC | PRN
  Administered 2013-08-13: 11:00:00

## 2013-08-13 MED ORDER — ROCURONIUM BROMIDE 100 MG/10ML IV SOLN
INTRAVENOUS | Status: DC | PRN
  Administered 2013-08-13: 10 mg via INTRAVENOUS
  Administered 2013-08-13: 30 mg via INTRAVENOUS

## 2013-08-13 MED ORDER — CEFAZOLIN SODIUM-DEXTROSE 2-3 GM-% IV SOLR
2.0000 g | INTRAVENOUS | Status: DC
  Administered 2013-08-13: 2 g via INTRAVENOUS

## 2013-08-13 MED ORDER — HYDROMORPHONE HCL PF 1 MG/ML IJ SOLN
INTRAMUSCULAR | Status: DC | PRN
  Administered 2013-08-13: 0.5 mg via INTRAVENOUS

## 2013-08-13 MED ORDER — LIDOCAINE HCL (CARDIAC) 20 MG/ML IV SOLN
INTRAVENOUS | Status: DC | PRN
  Administered 2013-08-13: 20 mg via INTRAVENOUS

## 2013-08-13 MED ORDER — GLYCOPYRROLATE 0.2 MG/ML IJ SOLN
INTRAMUSCULAR | Status: DC | PRN
  Administered 2013-08-13 (×2): 0.3 mg via INTRAVENOUS

## 2013-08-13 SURGICAL SUPPLY — 46 items
ADH SKN CLS APL DERMABOND .7 (GAUZE/BANDAGES/DRESSINGS) ×1
APPLIER CLIP 5 13 M/L LIGAMAX5 (MISCELLANEOUS)
APPLIER CLIP ROT 10 11.4 M/L (STAPLE)
APR CLP MED LRG 11.4X10 (STAPLE)
APR CLP MED LRG 5 ANG JAW (MISCELLANEOUS)
BAG SPEC RTRVL LRG 6X4 10 (ENDOMECHANICALS) ×1
BLADE SURG ROTATE 9660 (MISCELLANEOUS) IMPLANT
CANISTER SUCTION 2500CC (MISCELLANEOUS) ×2 IMPLANT
CHLORAPREP W/TINT 26ML (MISCELLANEOUS) ×2 IMPLANT
CHOLANGIOGRAM CATH TAUT (CATHETERS) ×2 IMPLANT
CLIP APPLIE 5 13 M/L LIGAMAX5 (MISCELLANEOUS) IMPLANT
CLIP APPLIE ROT 10 11.4 M/L (STAPLE) IMPLANT
CLOTH BEACON ORANGE TIMEOUT ST (SAFETY) ×2 IMPLANT
COVER MAYO STAND STRL (DRAPES) ×2 IMPLANT
COVER SURGICAL LIGHT HANDLE (MISCELLANEOUS) ×2 IMPLANT
DECANTER SPIKE VIAL GLASS SM (MISCELLANEOUS) ×2 IMPLANT
DERMABOND ADVANCED (GAUZE/BANDAGES/DRESSINGS) ×1
DERMABOND ADVANCED .7 DNX12 (GAUZE/BANDAGES/DRESSINGS) ×1 IMPLANT
DRAPE C-ARM 42X72 X-RAY (DRAPES) ×2 IMPLANT
DRAPE UTILITY 15X26 W/TAPE STR (DRAPE) ×4 IMPLANT
ELECT REM PT RETURN 9FT ADLT (ELECTROSURGICAL) ×2
ELECTRODE REM PT RTRN 9FT ADLT (ELECTROSURGICAL) ×1 IMPLANT
FILTER SMOKE EVAC LAPAROSHD (FILTER) ×2 IMPLANT
GLOVE SURG SIGNA 7.5 PF LTX (GLOVE) ×2 IMPLANT
GOWN STRL NON-REIN LRG LVL3 (GOWN DISPOSABLE) ×6 IMPLANT
GOWN STRL REIN XL XLG (GOWN DISPOSABLE) ×2 IMPLANT
HEMOSTAT SURGICEL 2X14 (HEMOSTASIS) ×1 IMPLANT
IV CATH 14GX2 1/4 (CATHETERS) ×2 IMPLANT
KIT BASIN OR (CUSTOM PROCEDURE TRAY) ×2 IMPLANT
KIT ROOM TURNOVER OR (KITS) ×2 IMPLANT
NS IRRIG 1000ML POUR BTL (IV SOLUTION) ×2 IMPLANT
PAD ARMBOARD 7.5X6 YLW CONV (MISCELLANEOUS) ×2 IMPLANT
POUCH SPECIMEN RETRIEVAL 10MM (ENDOMECHANICALS) ×2 IMPLANT
SCISSORS LAP 5X35 DISP (ENDOMECHANICALS) IMPLANT
SET IRRIG TUBING LAPAROSCOPIC (IRRIGATION / IRRIGATOR) ×2 IMPLANT
SPECIMEN JAR SMALL (MISCELLANEOUS) ×2 IMPLANT
STOPCOCK 4 WAY LG BORE MALE ST (IV SETS) ×2 IMPLANT
SUT VIC AB 5-0 PS2 18 (SUTURE) ×2 IMPLANT
TOWEL OR 17X24 6PK STRL BLUE (TOWEL DISPOSABLE) ×2 IMPLANT
TOWEL OR 17X26 10 PK STRL BLUE (TOWEL DISPOSABLE) ×2 IMPLANT
TRAY LAPAROSCOPIC (CUSTOM PROCEDURE TRAY) ×2 IMPLANT
TROCAR XCEL BLUNT TIP 100MML (ENDOMECHANICALS) ×2 IMPLANT
TROCAR XCEL NON-BLD 11X100MML (ENDOMECHANICALS) IMPLANT
TROCAR XCEL NON-BLD 5MMX100MML (ENDOMECHANICALS) ×4 IMPLANT
TUBING EXTENTION W/L.L. (IV SETS) ×2 IMPLANT
WATER STERILE IRR 1000ML POUR (IV SOLUTION) IMPLANT

## 2013-08-13 NOTE — Transfer of Care (Signed)
Immediate Anesthesia Transfer of Care Note  Patient: [name]  Procedure(s) Performed: Procedure(s): LAPAROSCOPIC CHOLECYSTECTOMY WITH INTRAOPERATIVE CHOLANGIOGRAM (N/A)  Patient Location: PACU  Anesthesia Type:General  Level of Consciousness: patient cooperative and responds to stimulation  Airway & Oxygen Therapy: Patient Spontanous Breathing and Patient connected to nasal cannula oxygen  Post-op Assessment: Report given to PACU RN and Post -op Vital signs reviewed and stable  Post vital signs: Reviewed and stable  Complications: No apparent anesthesia complications

## 2013-08-13 NOTE — Anesthesia Preprocedure Evaluation (Signed)
Anesthesia Evaluation  Patient identified by MRN, date of birth, ID band Patient awake    Reviewed: Allergy & Precautions, H&P , NPO status , Patient's Chart, lab work & pertinent test results  History of Anesthesia Complications (+) PONV  Airway Mallampati: II  Neck ROM: full    Dental   Pulmonary neg pulmonary ROS,          Cardiovascular + Valvular Problems/Murmurs MVP     Neuro/Psych  Neuromuscular disease    GI/Hepatic hiatal hernia, GERD-  ,gallstones   Endo/Other  obese  Renal/GU  Bladder dysfunction  Overactive bladder    Musculoskeletal  (+) Arthritis -,   Abdominal   Peds  Hematology   Anesthesia Other Findings   Reproductive/Obstetrics                           Anesthesia Physical Anesthesia Plan  ASA: III  Anesthesia Plan: General   Post-op Pain Management:    Induction: Intravenous  Airway Management Planned: Oral ETT  Additional Equipment:   Intra-op Plan:   Post-operative Plan: Extubation in OR  Informed Consent: I have reviewed the patients History and Physical, chart, labs and discussed the procedure including the risks, benefits and alternatives for the proposed anesthesia with the patient or authorized representative who has indicated his/her understanding and acceptance.     Plan Discussed with: CRNA, Anesthesiologist and Surgeon  Anesthesia Plan Comments:         Anesthesia Quick Evaluation

## 2013-08-13 NOTE — Preoperative (Signed)
Beta Blockers   Reason not to administer Beta Blockers:Not Applicable 

## 2013-08-13 NOTE — Interval H&P Note (Signed)
History and Physical Interval Note:  08/13/2013 10:18 AM  [name]  has presented today for surgery, with the diagnosis of gallstones  The various methods of treatment have been discussed with the patient and family. Patient's husband at bedside.  LFT's not back yet.  After consideration of risks, benefits and other options for treatment, the patient has consented to  Procedure(s): LAPAROSCOPIC CHOLECYSTECTOMY WITH INTRAOPERATIVE CHOLANGIOGRAM (N/A) as a surgical intervention .    The patient's history has been reviewed, patient examined, no change in status, stable for surgery.  I have reviewed the patient's chart and labs.  Questions were answered to the patient's satisfaction.     [name] H

## 2013-08-13 NOTE — Op Note (Signed)
08/13/2013  12:26 PM  PATIENT:  [name], 62 y.o., female, MRN: [id number]  PREOP DIAGNOSIS:  gallstones  POSTOP DIAGNOSIS:   Chronic cholecystitis, cholelithiasis  PROCEDURE:   Procedure(s):  LAPAROSCOPIC CHOLECYSTECTOMY WITH INTRAOPERATIVE CHOLANGIOGRAM  SURGEON:   Ovidio Kin, M.D.  ASSISTANTMegan Mans, MD  ANESTHESIA:   general  Anesthesiologist: Rivka Barbara, MD CRNA: Margaree Mackintosh, CRNA; Hessie Dibble, CRNA  General  ASA: 3  EBL:  minimal  ml  BLOOD ADMINISTERED: none  DRAINS: none   LOCAL MEDICATIONS USED:   30 cc 1/4% marcaine  SPECIMEN:   Gall bladder  COUNTS CORRECT:  YES  INDICATIONS FOR PROCEDURE:  [name] is a 62 y.o. (DOB: May 05, 1951) white  female whose primary care physician is Oliver Barre, MD and comes for cholecystectomy.   The indications and risks of the gall bladder surgery were explained to the patient.  The risks include, but are not limited to, infection, bleeding, common bile duct injury and open surgery.  SURGERY:  The patient was taken to room #4 at [facility]  The abdomen was prepped with chloroprep.  The patient was given 2 gm Ancef at the beginning of the operation.   A time out was held and the surgical checklist run.   An infraumbilical incision was made into the abdominal cavity.  A 12 mm Hasson trocar was inserted into the abdominal cavity through the infraumbilical incision and secured with a 0 Vicryl suture.  Three additional trocars were inserted: a 10 mm trocar in the sub-xiphoid location, a 5 mm trocar in the right mid subcostal area, and a 5 mm trocar in the right lateral subcostal area.   The abdomen was explored and the liver, stomach, and bowel that could be seen were unremarkable.   The gall bladder was invested in fat consistent with chronic cholecystitis.  The gall bladder was also high in the abdomen.  The gall bladder was identified, grasped, and rotated cephalad.  Disssection was carried down  to the gall bladder/cystic duct junction and the cystic duct isolated.  A clip was placed on the gall bladder side of the cystic duct.   An intra-operative cholangiogram was shot.   The intra-operative cholangiogram was shot using a cut off Taut catheter placed through a 14 gauge angiocath in the RUQ.  The Taut catheter was inserted in the cut cystic duct and secured with an endoclip.  A cholangiogram was shot with 10 cc of 1/2 strength Omnipaque.  Using fluoroscopy, the cholangiogram showed the flow of contrast into the common bile duct, up the hepatic radicals, and into the duodenum.  There was no mass or obstruction.  This was a normal intra-operative cholangiogram.   The Taut catheter was removed.  The cystic duct was tripley endoclipped and the cystic artery was identified and clipped.  The gall bladder was bluntly and sharpley dissected from the gall bladder bed.  There was some bleeding from the gall bladder bed that was controlled with electrocautery.  I then placed Surgicel in the gall bladder bed.   After the gall bladder was removed from the liver, the gall bladder bed and Tri[place] of [place] were inspected.  There was no bleeding or bile leak.  The gall bladder was placed in a endocatch bag and delivered through the umbilicus.  The abdomen was irrigated with 1,000 cc saline.   The trocars were then removed.  I infiltrated 30 cc of 1/4% Marcaine into the incisions.  The umbilical port closed with a 0 Vicryl suture and the skin closed with 5-0 vicryl.  The skin was painted with Dermabond.  The patient's sponge and needle count were correct.  The patient was transported to the RR in good condition.   The patient hopes to go home today.  Ovidio Kin, MD, [facility] Surgery Pager: [phone] Office phone:  [phone]

## 2013-08-13 NOTE — Progress Notes (Signed)
UP TO BATHROOM AND TOL WELL; VOIDED; NO C/O PAIN OR NAUSEA

## 2013-08-13 NOTE — H&P (View-Only) (Signed)
 Re:   [name] DOB:   10/04/1951 MRN:   [id number]  ASSESSMENT AND PLAN: 1.  Gallstones  Symptomatic.  I discussed with the patient the indications and risks of gall bladder surgery.  The primary risks of gall bladder surgery include, but are not limited to, bleeding, infection, common bile duct injury, and open surgery.  There is also the risk that the patient may have continued symptoms after surgery.  However, the likelihood of improvement in symptoms and return to the patient's normal status is good. We discussed the typical post-operative recovery course. I tried to answer the patient's questions.  I gave the patient literature about gall bladder surgery.  We talked about the options of having her surgery before she goes to [place] vs when she come back.  We're going to try to do her surgery next week.  1b.  Elevated LFT's - AST - 293, ALT - 225, Alk phos - 162 on 08/04/2013.  Presumably secondary to gall bladder disease.  T. Bili normal. Will repeat prior to surgery.  2.  Overactive bladder - 3 prior bladder tacks. 3.  GERD - with HH 4.  Prior neck surgery - still some numbness in left hand  Saw Dr. M. Roy 5.  Arthritis both hips. 6.  Obese.  BMI - 35.  Chief Complaint  Patient presents with  . New Evaluation    eval GB   REFERRING PHYSICIAN: James John, MD  HISTORY OF PRESENT ILLNESS: [name] is a 61 y.o. (DOB: 09/13/1951)  white  female whose primary care physician is James John, MD and comes to me today for gall bladder disease.  She had no trouble before Sunday, 08/04/2013.  She started having some discomfort around 2 PM.  By 6PM, she was vomiting.  She went to the [facility] ER that night.  A GI cocktail did not help her symptoms.  Her symptoms lasted about 12 hours. She went on for an US abdomen on Monday,  08/05/2013, which showed cholelithiasis. She had no jaundice, no fever, and after her attack, she was sore for a day, but has done better. She has a history of a hiatal  hernia with some reflux, she is on Protonix.  She had a colonoscopy by Dr. Stark in 10/2010, which was negative except for some minimal diverticulosis. She has a sister and 3 nieces who have had gall bladder surgery.   Past Medical History  Diagnosis Date  . GERD (gastroesophageal reflux disease)   . Arthritis   . Diverticulosis 02/11/2012  . OVERACTIVE BLADDER 08/10/2007    Qualifier: Diagnosis of  By: Wyrick, CMA, Cindy    . GERD 08/10/2007    Qualifier: Diagnosis of  By: Wyrick, CMA, Cindy    . ARTHRITIS, KNEE 02/10/2009    Qualifier: Diagnosis of  By: Stafford Jr MD, Willie R   . VAGINITIS, ATROPHIC 11/11/2008    Qualifier: Diagnosis of  By: Stafford Jr MD, Willie R   . HEMORRHOIDS-INTERNAL 07/21/2010    Qualifier: Diagnosis of  By: Guenther NP, Paula    . Cervical cancer   . Allergic rhinitis, cause unspecified   . Chronic LBP   . Obesity   . Degenerative arthritis of hip 02/11/2012  . Cervical disc disease 02/11/2012  . Hyperlipidemia 02/13/2012  . Recurrent UTI   . Hiatal hernia   . GERD (gastroesophageal reflux disease)   . SUI (stress urinary incontinence, female)   . OAB (overactive bladder)   . BRCA negative   03/25/13      Past Surgical History  Procedure Laterality Date  . Cervical discectomy  2003  . Bunionectomy  2007  . Shoulder arthroscopy  2004    frozen shoulder  . Tonsillectomy    . Cystocele repair  2003    rectocele repair  . Vaginal hysterectomy  1986  . Conization of cervix  1982    with D&C    Current Outpatient Prescriptions  Medication Sig Dispense Refill  . aspirin 81 MG tablet Take 81 mg by mouth 3 (three) times a week. Monday Wednesday Friday      . darifenacin (ENABLEX) 15 MG 24 hr tablet Take 15 mg by mouth daily.      . estrogens, conjugated, (PREMARIN) 0.625 MG tablet Take 1 tablet (0.625 mg total) by mouth daily. Take daily for 21 days then do not take for 7 days.  30 tablet  13  . HYDROcodone-acetaminophen (VICODIN) 2.5-500 MG per tablet Take 1  tablet by mouth every 6 (six) hours as needed for pain.  30 tablet  0  . MULTIPLE VITAMIN tablet Take 1 tablet by mouth daily.      . nabumetone (RELAFEN) 750 MG tablet Take one tablet by mouth twice daily as needed  for arthritis  60 tablet  4  . nitrofurantoin, macrocrystal-monohydrate, (MACROBID) 100 MG capsule Take one capsule by mouth one time daily  30 capsule  11  . pantoprazole (PROTONIX) 40 MG tablet Take one tablet by mouth one time daily  30 tablet  11  . solifenacin (VESICARE) 10 MG tablet Take 1 tablet (10 mg total) by mouth daily. One po qd  30 tablet  0   No current facility-administered medications for this visit.      Allergies  Allergen Reactions  . Amoxicillin     REACTION: unspecified  . Penicillins Other (See Comments)    Unknown reaction    REVIEW OF SYSTEMS: Skin:  No history of rash.  No history of abnormal moles. Infection:  No history of hepatitis or HIV.  No history of MRSA. Neurologic:  No history of stroke.  No history of seizure.  No history of headaches. Cardiac:  No history of hypertension. No history of heart disease.  No history of prior cardiac catheterization.  No history of seeing a cardiologist. Pulmonary:  Does not smoke cigarettes.  No asthma or bronchitis.  No OSA/CPAP. Breasts:  Grandmother had ovarian ca at age 45. Mother and sister with breast cancer. She went through BRCA testing in May 2014, which was negative.  Endocrine:  No diabetes. HgbA1C - 5. On 05/08/2012. No thyroid disease. Gastrointestinal:  See HPI.  No history of stomach disease.  No history of liver disease.  No history of pancreas disease.  No history of colon disease. Urologic:  Urinary incontinence.  She has had 3 bladder tacks.  Has seen Dr. R. Evans. GYN:  Vaginal hysterectomy in 1986 for positive Pap smears/prior cervical surgery. Musculoskeletal:  Right shoulder surgery for "frozen" shoulder - 2004.  Neck surgery by Dr. Roy in 2003.  Still has some hand numbness.  She has  "arthritis" of both hips. Hematologic:  On baby aspirin. Psycho-social:  The patient is oriented.   The patient has no obvious psychologic or social impairment to understanding our conversation and plan.  SOCIAL and FAMILY HISTORY: Married. Works for [employer].  (in fact, she has a [employer] T shirt on) Has one daughter. She is to go to [place] on   08/25/2013 to see her daughter.  PHYSICAL EXAM: BP 126/74  Pulse 68  Temp(Src) 97 F (36.1 C) (Temporal)  Resp 14  Ht 5' 8" (1.727 m)  Wt 231 lb 3.2 oz (104.872 kg)  BMI 35.16 kg/m2  General: WN obese WF who is alert and generally healthy appearing.  HEENT: Normal. Pupils equal. Neck: Supple. No mass.  No thyroid mass. Lymph Nodes:  No supraclavicular or cervical nodes. Lungs: Clear to auscultation and symmetric breath sounds. Heart:  RRR. No murmur or rub. Abdomen: Soft.  No tenderness. No hernia. Normal bowel sounds.  Obese. Lower abdominal scar from bladder tack. Rectal: Not done. Extremities:  Good strength and ROM  in upper and lower extremities. Neurologic:  Grossly intact to motor and sensory function. Psychiatric: Has normal mood and affect. Behavior is normal.   DATA REVIEWED: Epic and [place] report.  Hamilton Marinello, MD,  [facility] [address]  [address]   [place], [place]    [address] Phone:  [phone] FAX:  [phone]  

## 2013-08-13 NOTE — Anesthesia Postprocedure Evaluation (Signed)
  Anesthesia Post-op Note  Patient: [name]  Procedure(s) Performed: Procedure(s): LAPAROSCOPIC CHOLECYSTECTOMY WITH INTRAOPERATIVE CHOLANGIOGRAM (N/A)  Patient Location: PACU  Anesthesia Type:General  Level of Consciousness: awake, oriented, sedated and patient cooperative  Airway and Oxygen Therapy: Patient Spontanous Breathing  Post-op Pain: mild  Post-op Assessment: Post-op Vital signs reviewed, Patient's Cardiovascular Status Stable, Respiratory Function Stable, Patent Airway, No signs of Nausea or vomiting and Pain level controlled  Post-op Vital Signs: stable  Complications: No apparent anesthesia complications

## 2013-08-16 ENCOUNTER — Encounter (HOSPITAL_COMMUNITY): Payer: Self-pay | Admitting: Surgery

## 2013-08-19 ENCOUNTER — Encounter (INDEPENDENT_AMBULATORY_CARE_PROVIDER_SITE_OTHER): Payer: Self-pay

## 2013-08-19 ENCOUNTER — Telehealth (INDEPENDENT_AMBULATORY_CARE_PROVIDER_SITE_OTHER): Payer: Self-pay

## 2013-08-19 NOTE — Telephone Encounter (Signed)
Patient states she is  doing good. She is going out of town and will not return to th first week of November.P/o appt 09-18-13 @ 10:15 Patient aware

## 2013-08-21 ENCOUNTER — Encounter: Payer: Self-pay | Admitting: Internal Medicine

## 2013-08-29 ENCOUNTER — Encounter (INDEPENDENT_AMBULATORY_CARE_PROVIDER_SITE_OTHER): Payer: Federal, State, Local not specified - PPO | Admitting: Surgery

## 2013-09-09 ENCOUNTER — Encounter (INDEPENDENT_AMBULATORY_CARE_PROVIDER_SITE_OTHER): Payer: Self-pay | Admitting: General Surgery

## 2013-09-09 ENCOUNTER — Ambulatory Visit (INDEPENDENT_AMBULATORY_CARE_PROVIDER_SITE_OTHER): Payer: Federal, State, Local not specified - PPO | Admitting: General Surgery

## 2013-09-09 VITALS — BP 140/80 | HR 84 | Temp 97.8°F | Resp 15 | Ht 68.0 in | Wt 230.0 lb

## 2013-09-09 DIAGNOSIS — Z4889 Encounter for other specified surgical aftercare: Secondary | ICD-10-CM

## 2013-09-09 DIAGNOSIS — Z5189 Encounter for other specified aftercare: Secondary | ICD-10-CM

## 2013-09-09 MED ORDER — SULFAMETHOXAZOLE-TRIMETHOPRIM 400-80 MG PO TABS: 1.0000 | ORAL_TABLET | Freq: Two times a day (BID) | ORAL | Status: AC

## 2013-09-09 NOTE — Progress Notes (Signed)
Subjective:     Patient ID: [name], female   DOB: [birth year], 62 y.o.   MRN: [id number]  HPI This patient follows up status post endoscopic cholecystectomy for acute cholecystitis. She says that she has had some pain around her umbilicus as well as some erythema. She has had some drainage but now resolved.  No fevers or chills  Review of Systems     Objective:   Physical Exam No distress and nontoxic-appearing she has some blanching erythema inferior to her umbilicus. I removed a small scab at the end of her incision and a small amount of purulent material was expressed consistent with a suture abscess.    Assessment:     Small suture abscess I think that this should be fine with a short course of antibiotics. I do not think that she needs formal incision and drainage. Expressed a small amount of preoperative material from the incision and I prescribed her 10 days of Bactrim. She has a followup appointment with her surgeon's hands and she will follow with him otherwise she can follow up with [place] sooner as needed for any increasing pain or fevers or increasing erythema     Plan:     Bactrim Followup within a week for repeat examination.

## 2013-09-18 ENCOUNTER — Ambulatory Visit (INDEPENDENT_AMBULATORY_CARE_PROVIDER_SITE_OTHER): Payer: Federal, State, Local not specified - PPO | Admitting: Surgery

## 2013-09-18 ENCOUNTER — Encounter (INDEPENDENT_AMBULATORY_CARE_PROVIDER_SITE_OTHER): Payer: Self-pay | Admitting: Surgery

## 2013-09-18 VITALS — BP 130/80 | HR 76 | Temp 97.2°F | Resp 14 | Ht 68.0 in | Wt 226.2 lb

## 2013-09-18 DIAGNOSIS — K8020 Calculus of gallbladder without cholecystitis without obstruction: Secondary | ICD-10-CM

## 2013-09-18 NOTE — Progress Notes (Signed)
Re:   [name] DOB:   [birth year] MRN:   [id number]  ASSESSMENT AND PLAN: 1.  Gallstones  Lap chole - 08/13/2013 - D. Zafiro Routson  Has done well with the surgery (except the sutures).   Her return to me is PRN. 1b.  Elevated LFT's  These had improved pre op and her cholangiogram was normal.  No reason for follow up. 1c.  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  I removed the suture in the office today.  2.  Overactive bladder - 3 prior bladder tacks. 3.  GERD - with HH 4.  Prior neck surgery - still some numbness in left hand  Saw Dr. Judie Petit. Roy 5.  Arthritis both hips. 6.  Obese.  BMI - 35.  Chief Complaint  Patient presents with  . Routine Post Op    1st po lap chole   REFERRING PHYSICIAN: Oliver Barre, MD  HISTORY OF PRESENT ILLNESS: [name] is a 62 y.o. (DOB: 05-17-1951)  white  female whose primary care physician is Oliver Barre, MD and comes to me for follow up of a lap chole.  She saw Dr. Trude Mcburney on 08/09/2013 for some umbilical pain.  He put her on Bactrim x 10 days.  She has a suture granuloma.  I removed the knot.  Otherwise she is doing well.  History of gall bladder disease (08/2013): She had no trouble before Sunday, 08/04/2013.  She started having some discomfort around 2 PM.  By [place], she was vomiting.  She went to the [facility] ER that night.  A GI cocktail did not help her symptoms.  Her symptoms lasted about 12 hours. She went on for an US abdomen on Monday,  08/05/2013, which showed cholelithiasis. She had no jaundice, no fever, and after her attack, she was sore for a day, but has done better. She has a history of a hiatal hernia with some reflux, she is on Protonix.  She had a colonoscopy by Dr. Russella Dar in 10/2010, which was negative except for some minimal diverticulosis. She has a sister and 3 nieces who have had gall bladder surgery.   Past Medical History  Diagnosis Date  . GERD (gastroesophageal reflux disease)   . Arthritis   . Diverticulosis 02/11/2012  . OVERACTIVE BLADDER  08/10/2007    Qualifier: Diagnosis of  By: Linna Darner, CMA, Cindy    . GERD 08/10/2007    Qualifier: Diagnosis of  By: Linna Darner, CMA, Cindy    . ARTHRITIS, KNEE 02/10/2009    Qualifier: Diagnosis of  By: Alphonzo Severance MD, Lance Muss, ATROPHIC 11/11/2008    Qualifier: Diagnosis of  By: Alphonzo Severance MD, Loni Dolly HEMORRHOIDS-INTERNAL 07/21/2010    Qualifier: Diagnosis of  By: Wilmon Pali NP, Gunnar Fusi    . Cervical cancer   . Allergic rhinitis, cause unspecified   . Chronic LBP   . Obesity   . Degenerative arthritis of hip 02/11/2012  . Cervical disc disease 02/11/2012  . Hyperlipidemia 02/13/2012  . Recurrent UTI   . Hiatal hernia   . GERD (gastroesophageal reflux disease)   . SUI (stress urinary incontinence, female)   . OAB (overactive bladder)   . BRCA negative 03/25/13  . Mitral valve prolapse   . Pneumonia   . PONV (postoperative nausea and vomiting)       Past Surgical History  Procedure Laterality Date  . Cervical discectomy  2003  . Bunionectomy  2007  . Shoulder arthroscopy  2004  frozen shoulder  . Tonsillectomy    . Cystocele repair  2003    rectocele repair  . Vaginal hysterectomy  1986  . Conization of cervix  1982    with D&C  . Eye surgery Bilateral     lasik  . Colonoscopy    . Cholecystectomy N/A 08/13/2013    Procedure: LAPAROSCOPIC CHOLECYSTECTOMY WITH INTRAOPERATIVE CHOLANGIOGRAM;  Surgeon: Kandis Cocking, MD;  Location: [facility] OR;  Service: General;  Laterality: N/A;    Current Outpatient Prescriptions  Medication Sig Dispense Refill  . aspirin 81 MG tablet Take 81 mg by mouth 3 (three) times a week. Monday Wednesday Friday      . darifenacin (ENABLEX) 15 MG 24 hr tablet Take 15 mg by mouth daily.      [name] Kitchen estrogens, conjugated, (PREMARIN) 0.625 MG tablet Take 1 tablet (0.625 mg total) by mouth daily. Take daily for 21 days then do not take for 7 days.  30 tablet  13  . HYDROcodone-acetaminophen (VICODIN) 2.5-500 MG per tablet Take 1 tablet by mouth every 6 (six)  hours as needed for pain.  30 tablet  0  . MULTIPLE VITAMIN tablet Take 1 tablet by mouth daily.      . nabumetone (RELAFEN) 750 MG tablet Take one tablet by mouth twice daily as needed  for arthritis  60 tablet  4  . nitrofurantoin, macrocrystal-monohydrate, (MACROBID) 100 MG capsule Take one capsule by mouth one time daily  30 capsule  11  . ondansetron (ZOFRAN) 4 MG tablet Take 4 mg by mouth every 8 (eight) hours as needed for nausea.      . pantoprazole (PROTONIX) 40 MG tablet Take one tablet by mouth one time daily  30 tablet  11  . sulfamethoxazole-trimethoprim (BACTRIM) 400-80 MG per tablet Take 1 tablet by mouth 2 (two) times daily.  20 tablet  0   No current facility-administered medications for this visit.      Allergies  Allergen Reactions  . Amoxicillin     REACTION: unspecified  . Penicillins Other (See Comments)    Unknown reaction    REVIEW OF SYSTEMS: Breasts:  Grandmother had ovarian ca at age 69. Mother and sister with breast cancer. She went through BRCA testing in May 2014, which was negative. Endocrine:  No diabetes. HgbA1C - 5. On 05/08/2012. No thyroid disease. Gastrointestinal:  See HPI.  No history of stomach disease.  No history of liver disease.  No history of pancreas disease.  No history of colon disease. Urologic:  Urinary incontinence.  She has had 3 bladder tacks.  Has seen Dr. Marye Round. GYN:  Vaginal hysterectomy in 1986 for positive Pap smears/prior cervical surgery. Musculoskeletal:  Right shoulder surgery for "frozen" shoulder - 2004.  Neck surgery by Dr. Channing Mutters in 2003.  Still has some hand numbness.  She has "arthritis" of both hips. Hematologic:  On baby aspirin.  SOCIAL and FAMILY HISTORY: Married. Works for [employer].  (in fact, she has a [employer] T shirt on) Has one daughter. She is to go to [place] on 08/25/2013 to see her daughter.  PHYSICAL EXAM: BP 130/80  Pulse 76  Temp(Src) 97.2 F (36.2 C) (Temporal)  Resp 14   Ht 5\' 8"  (1.727 m)  Wt 226 lb 3.2 oz (102.604 kg)  BMI 34.40 kg/m2  General: WN obese WF who is alert and generally healthy appearing.  Abdomen: Soft.  No tenderness.  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  Also suture  at subxiphoid and right mid subcostal incision.  DATA REVIEWED: Path report to the patient.  Ovidio Kin, MD,  [facility] [address] [place]  [address]   [place], [place]    [address] Phone:  [phone] FAX:  [phone]

## 2013-09-19 ENCOUNTER — Encounter (INDEPENDENT_AMBULATORY_CARE_PROVIDER_SITE_OTHER): Payer: Federal, State, Local not specified - PPO | Admitting: Surgery

## 2013-10-21 ENCOUNTER — Telehealth: Payer: Self-pay | Admitting: Obstetrics & Gynecology

## 2013-10-21 ENCOUNTER — Encounter: Payer: Self-pay | Admitting: Obstetrics & Gynecology

## 2013-10-21 ENCOUNTER — Ambulatory Visit (INDEPENDENT_AMBULATORY_CARE_PROVIDER_SITE_OTHER): Payer: Federal, State, Local not specified - PPO | Admitting: Obstetrics & Gynecology

## 2013-10-21 VITALS — BP 118/60 | HR 60 | Temp 98.0°F | Resp 16 | Ht 67.0 in | Wt 230.0 lb

## 2013-10-21 DIAGNOSIS — N390 Urinary tract infection, site not specified: Secondary | ICD-10-CM

## 2013-10-21 LAB — POCT URINALYSIS DIPSTICK
Blood, UA: 1
Glucose, UA: NEGATIVE
Ketones, UA: NEGATIVE
Nitrite, UA: POSITIVE
Urobilinogen, UA: NEGATIVE
pH, UA: 5

## 2013-10-21 MED ORDER — PHENAZOPYRIDINE HCL 100 MG PO TABS: 100.0000 mg | ORAL_TABLET | Freq: Three times a day (TID) | ORAL | Status: DC | PRN

## 2013-10-21 MED ORDER — CIPROFLOXACIN HCL 500 MG PO TABS: 500.0000 mg | ORAL_TABLET | Freq: Two times a day (BID) | ORAL | Status: DC

## 2013-10-21 NOTE — Telephone Encounter (Signed)
Spoke with pt who has had urinary frequency for about a week, and urgency and burning for the past 2 days. Sched OV with SM today at 1:15.

## 2013-10-21 NOTE — Telephone Encounter (Signed)
Pt thinks she may have a bladder infection.

## 2013-10-21 NOTE — Progress Notes (Signed)
S:  62 y.o.Married Caucasian female presents with UTI symptoms of dysuria, urinary frequency, urinary urgency. Onset of symptoms for 1 week.  Urgency is worse the last 2 days.  Has not seen any blood in urine.  Patient is sexually active with husband.  No fevers.  No back pain.  A little achy but she has felt a little cough the past few days.  It has been years since last UTI.  ROS: no weight loss, fever, night sweats  O alert, oriented to person, place, and time   healthy and alert  mild, suprapublic tenderness  No CVA tenderness   Assessment:  Acute cystitis  Plan:  Cipro 500mg  bid x 7 days Pyridium 100mg  tid until symptoms resolve Urine culture.  If +, will plan repeat TOC.

## 2013-10-21 NOTE — Patient Instructions (Signed)
Please call if symptoms not significantly better after 24 hours of antibiotics.

## 2013-10-23 ENCOUNTER — Ambulatory Visit: Payer: Federal, State, Local not specified - PPO | Admitting: Obstetrics & Gynecology

## 2013-10-23 LAB — URINE CULTURE: Colony Count: >=100000

## 2013-10-24 NOTE — Addendum Note (Signed)
Addended by: Jerene Bears on: 10/24/2013 03:47 PM   Modules accepted: Orders

## 2013-10-25 NOTE — Telephone Encounter (Signed)
Message copied by Elisha Headland on Fri Oct 25, 2013  3:04 PM ------      Message from: Jerene Bears      Created: Thu Oct 24, 2013  3:47 PM       Inform urine cx + for E coli.  She is on correct antibiotic.  Needs to finish them all and return for repeat culture in about 2 weeks.  Order placed. ------

## 2013-10-25 NOTE — Telephone Encounter (Signed)
lmtcb

## 2013-11-04 ENCOUNTER — Ambulatory Visit (INDEPENDENT_AMBULATORY_CARE_PROVIDER_SITE_OTHER): Payer: Federal, State, Local not specified - PPO | Admitting: Obstetrics & Gynecology

## 2013-11-04 ENCOUNTER — Encounter: Payer: Self-pay | Admitting: Obstetrics & Gynecology

## 2013-11-04 VITALS — BP 113/77 | HR 72 | Wt 226.0 lb

## 2013-11-04 DIAGNOSIS — N318 Other neuromuscular dysfunction of bladder: Secondary | ICD-10-CM

## 2013-11-04 DIAGNOSIS — N390 Urinary tract infection, site not specified: Secondary | ICD-10-CM

## 2013-11-04 DIAGNOSIS — N3281 Overactive bladder: Secondary | ICD-10-CM

## 2013-11-04 LAB — POCT URINALYSIS DIPSTICK
Bilirubin, UA: NEGATIVE
Glucose, UA: NEGATIVE
Leukocytes, UA: NEGATIVE
Nitrite, UA: NEGATIVE
Protein, UA: NEGATIVE
Urobilinogen, UA: NEGATIVE

## 2013-11-04 NOTE — Progress Notes (Signed)
Subjective:     Patient ID: [name], female   DOB: 08/29/1951, 62 y.o.   MRN: [id number]  HPI 62 yo G1P1 MWF here for f/u after having an E.Coli UTI.  Pt finished antibiotics.  Continues to have urgency.  Here for TOC.  Reported symptoms to CMA who reported to me.  I advised pt I would like to examine her today.  No vaginal bleeding.  No vaginal discharge or odor.  Dysuria is resolved.  Pt did switch from Enablex to Vesicare and has just done this in the last week which may account for her symptom change.  Will plan to repeat culture today.  Review of Systems  All other systems reviewed and are negative.       Objective:   Physical Exam  Constitutional: She is oriented to person, place, and time. She appears well-developed and well-nourished.  Abdominal: Soft. Bowel sounds are normal.  Genitourinary: There is no rash or tenderness on the right labia. There is no rash or tenderness on the left labia. No vaginal discharge (and atrophic changes noted) found.  Urethra/meatus normal.  Lymphadenopathy:       Right: No inguinal adenopathy present.       Left: No inguinal adenopathy present.  Neurological: She is oriented to person, place, and time.  Skin: Skin is warm and dry.  Psychiatric: She has a normal mood and affect.       Assessment:     Urinary urgency H/O OAB H/O recent UTI     Plan:     Repeat culture pending If neg, may need to switch from Vesicare.  Pt going to check and make sure she is taking the 10mg  dosage.  If not, she will increase to 2-5mg  tablets daily and give update.

## 2013-11-04 NOTE — Patient Instructions (Signed)
Please call and give me an update about the urgency in another 1-2 weeks.

## 2013-11-06 LAB — URINE CULTURE
Colony Count: NO GROWTH
Organism ID, Bacteria: NO GROWTH

## 2014-02-19 ENCOUNTER — Ambulatory Visit (INDEPENDENT_AMBULATORY_CARE_PROVIDER_SITE_OTHER): Payer: Federal, State, Local not specified - PPO | Admitting: Nurse Practitioner

## 2014-02-19 ENCOUNTER — Encounter: Payer: Self-pay | Admitting: Nurse Practitioner

## 2014-02-19 VITALS — BP 104/60 | HR 84 | Ht 67.0 in | Wt 223.0 lb

## 2014-02-19 DIAGNOSIS — N390 Urinary tract infection, site not specified: Secondary | ICD-10-CM

## 2014-02-19 DIAGNOSIS — R3915 Urgency of urination: Secondary | ICD-10-CM

## 2014-02-19 LAB — POCT URINALYSIS DIPSTICK
Glucose, UA: NEGATIVE
KETONES UA: NEGATIVE
Nitrite, UA: POSITIVE
Urobilinogen, UA: NEGATIVE
pH, UA: 8

## 2014-02-19 MED ORDER — CIPROFLOXACIN HCL 500 MG PO TABS: 500.0000 mg | ORAL_TABLET | Freq: Two times a day (BID) | ORAL | Status: DC

## 2014-02-19 NOTE — Progress Notes (Signed)
S:   63 y.o.Married Caucasian female presents with complaint of UTI. Symptoms began on  02/15/14. With symptoms of blood in urine, dysuria, urinary frequency, urinary urgency. Pertinent negatives include no flank pain or chills.   The patient is having constitutional symptoms, including fatigue, fevers and URI symptoms of sore throat and nasal congestion. Sexually active: yes.  Symptoms related to post coital: No.   Current method of birth control postmenopasusla.  Vaginal dryness: yes.   Same partner without change. Last UTI documented  6 months ago.  ROS:  feels ill, fatigued, fever of 100.4 last pm and URI symptoms with sore throat and nasal congestion  O alert, oriented to person, place, and time, normal mood, behavior, speech,    dress, motor activity, and thought processes   healthy,  alert and  not in acute distress  Mild discomfort over the suprapubic area  No CVA tenderness             Pelvic exam is  deferred   Diagnostic Test:    Urinalysis wbc- 3+, rbc- trace, nitrate +, trace of protein   urine culture and micro ordered  Assessment:   R/O UTI  URI symptoms as well    Plan:    Medications: ciprofloxacin. Maintain adequate hydration. Follow up if symptoms not improving, and as needed.   Medication Therapy: Cipro 500 mg BID #14   Lab:TOC if Urine Culture is positive        RV

## 2014-02-19 NOTE — Patient Instructions (Signed)
Urinary Tract Infection  Urinary tract infections (UTIs) can develop anywhere along your urinary tract. Your urinary tract is your body's drainage system for removing wastes and extra water. Your urinary tract includes two kidneys, two ureters, a bladder, and a urethra. Your kidneys are a pair of bean-shaped organs. Each kidney is about the size of your fist. They are located below your ribs, one on each side of your spine.  CAUSES  Infections are caused by microbes, which are microscopic organisms, including fungi, viruses, and bacteria. These organisms are so small that they can only be seen through a microscope. Bacteria are the microbes that most commonly cause UTIs.  SYMPTOMS   Symptoms of UTIs may vary by age and gender of the patient and by the location of the infection. Symptoms in young women typically include a frequent and intense urge to urinate and a painful, burning feeling in the bladder or urethra during urination. Older women and men are more likely to be tired, shaky, and weak and have muscle aches and abdominal pain. A fever may mean the infection is in your kidneys. Other symptoms of a kidney infection include pain in your back or sides below the ribs, nausea, and vomiting.  DIAGNOSIS  To diagnose a UTI, your caregiver will ask you about your symptoms. Your caregiver also will ask to provide a urine sample. The urine sample will be tested for bacteria and white blood cells. White blood cells are made by your body to help fight infection.  TREATMENT   Typically, UTIs can be treated with medication. Because most UTIs are caused by a bacterial infection, they usually can be treated with the use of antibiotics. The choice of antibiotic and length of treatment depend on your symptoms and the type of bacteria causing your infection.  HOME CARE INSTRUCTIONS   If you were prescribed antibiotics, take them exactly as your caregiver instructs you. Finish the medication even if you feel better after you  have only taken some of the medication.   Drink enough water and fluids to keep your urine clear or pale yellow.   Avoid caffeine, tea, and carbonated beverages. They tend to irritate your bladder.   Empty your bladder often. Avoid holding urine for long periods of time.   Empty your bladder before and after sexual intercourse.   After a bowel movement, women should cleanse from front to back. Use each tissue only once.  SEEK MEDICAL CARE IF:    You have back pain.   You develop a fever.   Your symptoms do not begin to resolve within 3 days.  SEEK IMMEDIATE MEDICAL CARE IF:    You have severe back pain or lower abdominal pain.   You develop chills.   You have nausea or vomiting.   You have continued burning or discomfort with urination.  MAKE SURE YOU:    Understand these instructions.   Will watch your condition.   Will get help right away if you are not doing well or get worse.  Document Released: 08/03/2005 Document Revised: 04/24/2012 Document Reviewed: 12/02/2011  [employer] Patient Information 2014 [employer].

## 2014-02-20 LAB — URINALYSIS, MICROSCOPIC ONLY
CRYSTALS: NONE SEEN
Casts: NONE SEEN
Squamous Epithelial / LPF: NONE SEEN

## 2014-02-21 NOTE — Progress Notes (Signed)
Encounter reviewed by Dr. Brook Silva.  

## 2014-02-22 LAB — URINE CULTURE: Colony Count: >=100000

## 2014-02-24 ENCOUNTER — Telehealth: Payer: Self-pay

## 2014-02-24 DIAGNOSIS — N390 Urinary tract infection, site not specified: Secondary | ICD-10-CM

## 2014-02-24 NOTE — Telephone Encounter (Signed)
Spoke with patient. Patient returning call to Amy to schedule appointment. Results given from [place] as seen below. Patient would like to schedule two week follow up nurse visit at this time. Appointment scheduled for 4/28 at 0900 am. Patient agreeable to date and time.  Notes Recorded by Oleta Mouse, RN on 02/24/2014 at 11:09 AM LMTCB on home and cell phones to call back for results. ------  Notes Recorded by Milford Cage, FNP on 02/23/2014 at 6:46 PM Results via My Chart. She needs TOC in 2 weeks.   Routing to provider for final review. Patient agreeable to disposition. Will close encounter

## 2014-03-02 ENCOUNTER — Emergency Department (HOSPITAL_BASED_OUTPATIENT_CLINIC_OR_DEPARTMENT_OTHER): Payer: Federal, State, Local not specified - PPO

## 2014-03-02 ENCOUNTER — Emergency Department (HOSPITAL_BASED_OUTPATIENT_CLINIC_OR_DEPARTMENT_OTHER)
Admission: EM | Admit: 2014-03-02 | Discharge: 2014-03-02 | Disposition: A | Discharge status: Home / Self Care | Payer: Federal, State, Local not specified - PPO | Attending: Emergency Medicine | Admitting: Emergency Medicine

## 2014-03-02 ENCOUNTER — Encounter (HOSPITAL_BASED_OUTPATIENT_CLINIC_OR_DEPARTMENT_OTHER): Payer: Self-pay | Admitting: Emergency Medicine

## 2014-03-02 DIAGNOSIS — Z87440 Personal history of urinary (tract) infections: Secondary | ICD-10-CM | POA: Insufficient documentation

## 2014-03-02 DIAGNOSIS — K219 Gastro-esophageal reflux disease without esophagitis: Secondary | ICD-10-CM | POA: Insufficient documentation

## 2014-03-02 DIAGNOSIS — Z79899 Other long term (current) drug therapy: Secondary | ICD-10-CM | POA: Insufficient documentation

## 2014-03-02 DIAGNOSIS — Z8541 Personal history of malignant neoplasm of cervix uteri: Secondary | ICD-10-CM | POA: Insufficient documentation

## 2014-03-02 DIAGNOSIS — Z87448 Personal history of other diseases of urinary system: Secondary | ICD-10-CM | POA: Insufficient documentation

## 2014-03-02 DIAGNOSIS — Z7982 Long term (current) use of aspirin: Secondary | ICD-10-CM | POA: Insufficient documentation

## 2014-03-02 DIAGNOSIS — Z8701 Personal history of pneumonia (recurrent): Secondary | ICD-10-CM | POA: Insufficient documentation

## 2014-03-02 DIAGNOSIS — Z792 Long term (current) use of antibiotics: Secondary | ICD-10-CM | POA: Insufficient documentation

## 2014-03-02 DIAGNOSIS — Y929 Unspecified place or not applicable: Secondary | ICD-10-CM | POA: Insufficient documentation

## 2014-03-02 DIAGNOSIS — E669 Obesity, unspecified: Secondary | ICD-10-CM | POA: Insufficient documentation

## 2014-03-02 DIAGNOSIS — Z8742 Personal history of other diseases of the female genital tract: Secondary | ICD-10-CM | POA: Insufficient documentation

## 2014-03-02 DIAGNOSIS — W010XXA Fall on same level from slipping, tripping and stumbling without subsequent striking against object, initial encounter: Secondary | ICD-10-CM | POA: Insufficient documentation

## 2014-03-02 DIAGNOSIS — IMO0002 Reserved for concepts with insufficient information to code with codable children: Secondary | ICD-10-CM | POA: Insufficient documentation

## 2014-03-02 DIAGNOSIS — Z880 Allergy status to penicillin: Secondary | ICD-10-CM | POA: Insufficient documentation

## 2014-03-02 DIAGNOSIS — S63509A Unspecified sprain of unspecified wrist, initial encounter: Secondary | ICD-10-CM

## 2014-03-02 DIAGNOSIS — Y939 Activity, unspecified: Secondary | ICD-10-CM | POA: Insufficient documentation

## 2014-03-02 DIAGNOSIS — G8929 Other chronic pain: Secondary | ICD-10-CM | POA: Insufficient documentation

## 2014-03-02 DIAGNOSIS — M1710 Unilateral primary osteoarthritis, unspecified knee: Secondary | ICD-10-CM | POA: Insufficient documentation

## 2014-03-02 NOTE — Discharge Instructions (Signed)
°  Wear the splint until your symptoms are gone. Apply ice today as needed for pain or swelling. Tylenol Or Motrin for pain  Sprain A sprain is a tear in one of the strong, fibrous tissues that connect your bones (ligaments). The severity of the sprain depends on how much of the ligament is torn. The tear can be either partial or complete. CAUSES  Often, sprains are a result of a fall or an injury. The force of the impact causes the fibers of your ligament to stretch beyond their normal length. This excess tension causes the fibers of your ligament to tear. SYMPTOMS  You may have some loss of motion or increased pain within your normal range of motion. Other symptoms include:  Bruising.  Tenderness.  Swelling. DIAGNOSIS  In order to diagnose a sprain, your caregiver will physically examine you to determine how torn the ligament is. Your caregiver may also suggest an X-ray exam to make sure no bones are broken. TREATMENT  If your ligament is only partially torn, treatment usually involves keeping the injured area in a fixed position (immobilization) for a short period. To do this, your caregiver will apply a bandage, cast, or splint to keep the area from moving until it heals. For a partially torn ligament, the healing process usually takes 2 to 3 weeks. If your ligament is completely torn, you may need surgery to reconnect the ligament to the bone or to reconstruct the ligament. After surgery, a cast or splint may be applied and will need to stay on for 4 to 6 weeks while your ligament heals. HOME CARE INSTRUCTIONS  Keep the injured area elevated to decrease swelling.  To ease pain and swelling, apply ice to your joint twice a day, for 2 to 3 days.  Put ice in a plastic bag.  Place a towel between your skin and the bag.  Leave the ice on for 15 minutes.  Only take over-the-counter or prescription medicine for pain as directed by your caregiver.  Do not leave the injured area  unprotected until pain and stiffness go away (usually 3 to 4 weeks).  Do not allow your cast or splint to get wet. Cover your cast or splint with a plastic bag when you shower or bathe. Do not swim.  Your caregiver may suggest exercises for you to do during your recovery to prevent or limit permanent stiffness. SEEK IMMEDIATE MEDICAL CARE IF:  Your cast or splint becomes damaged.  Your pain becomes worse. MAKE SURE YOU:  Understand these instructions.  Will watch your condition.  Will get help right away if you are not doing well or get worse. Document Released: 10/21/2000 Document Revised: 01/16/2012 Document Reviewed: 11/05/2011 [facility] Patient Information 2014 [place], [place].

## 2014-03-02 NOTE — ED Provider Notes (Signed)
CSN: [id number]     Arrival date & time 03/02/14  7681 History   First MD Initiated Contact with Patient 03/02/14 [phone]     Chief Complaint  Patient presents with  . Arm Injury     HPI  He slipped down onto the left forearm. Uncertain if she landed on her hand or her wrist. He struck her forearm. Also the pain at the wrist as well. Is able to move the wrist with minimal pain.  Past Medical History  Diagnosis Date  . GERD (gastroesophageal reflux disease)   . Arthritis   . Diverticulosis 02/11/2012  . OVERACTIVE BLADDER 08/10/2007    Qualifier: Diagnosis of  By: Sherlynn Stalls, CMA, [place]    . GERD 08/10/2007    Qualifier: Diagnosis of  By: Sherlynn Stalls, CMA, [place]    . ARTHRITIS, KNEE 02/10/2009    Qualifier: Diagnosis of  By: Niel Hummer MD, Baldemar Lenis, ATROPHIC 11/11/2008    Qualifier: Diagnosis of  By: Niel Hummer MD, Lorinda Creed HEMORRHOIDS-INTERNAL 07/21/2010    Qualifier: Diagnosis of  By: Chester Holstein NP, Nevin Bloodgood    . Cervical cancer   . Allergic rhinitis, cause unspecified   . Chronic LBP   . Obesity   . Degenerative arthritis of hip 02/11/2012  . Cervical disc disease 02/11/2012  . Hyperlipidemia 02/13/2012  . Recurrent UTI   . Hiatal hernia   . GERD (gastroesophageal reflux disease)   . SUI (stress urinary incontinence, female)   . OAB (overactive bladder)   . BRCA negative 03/25/13  . Mitral valve prolapse   . Pneumonia   . PONV (postoperative nausea and vomiting)    Past Surgical History  Procedure Laterality Date  . Cervical discectomy  2003  . Bunionectomy  2007  . Shoulder arthroscopy  2004    frozen shoulder  . Tonsillectomy    . Cystocele repair  2003    rectocele repair  . Vaginal hysterectomy  1986  . Conization of cervix  1982    with D&C  . Eye surgery Bilateral     lasik  . Colonoscopy    . Cholecystectomy N/A 08/13/2013    Procedure: LAPAROSCOPIC CHOLECYSTECTOMY WITH INTRAOPERATIVE CHOLANGIOGRAM;  Surgeon: Shann Medal, MD;  Location: [facility];  Service:  General;  Laterality: N/A;   Family History  Problem Relation Age of Onset  . Breast cancer Mother   . Heart disease Sister   . Breast cancer Sister   . Diabetes Brother   . Heart disease Brother   . Diabetes Maternal Aunt   . Diabetes Maternal Uncle   . Cancer Maternal Grandmother     uterine   History  Substance Use Topics  . Smoking status: Never Smoker   . Smokeless tobacco: Never Used  . Alcohol Use: Yes     Comment: occassional drink   OB History   Grav Para Term Preterm Abortions TAB SAB Ect Mult Living   '1 1 1       1     ' Review of Systems  Constitutional: Negative for fever, chills, diaphoresis, appetite change and fatigue.  HENT: Negative for mouth sores, sore throat and trouble swallowing.   Eyes: Negative for visual disturbance.  Respiratory: Negative for cough, chest tightness, shortness of breath and wheezing.   Cardiovascular: Negative for chest pain.  Gastrointestinal: Negative for nausea, vomiting, abdominal pain, diarrhea and abdominal distention.  Endocrine: Negative for polydipsia, polyphagia and polyuria.  Genitourinary: Negative for dysuria, frequency and  hematuria.  Musculoskeletal: Negative for gait problem.       Right wrist and forearm pain  Skin: Negative for color change, pallor and rash.  Neurological: Negative for dizziness, syncope, light-headedness and headaches.  Hematological: Does not bruise/bleed easily.  Psychiatric/Behavioral: Negative for behavioral problems and confusion.      Allergies  Amoxicillin and Penicillins  Home Medications   Prior to Admission medications   Medication Sig Start Date End Date Taking? Authorizing Provider  aspirin 81 MG tablet Take 81 mg by mouth 3 (three) times a week. Monday Wednesday Friday    Historical Provider, MD  ciprofloxacin (CIPRO) 500 MG tablet Take 1 tablet (500 mg total) by mouth 2 (two) times daily. 02/19/14   Milford Cage, FNP  estrogens, conjugated, (PREMARIN) 0.625 MG tablet  Take 1 tablet (0.625 mg total) by mouth daily. Take daily for 21 days then do not take for 7 days. 03/14/13   Lyman Speller, MD  HYDROcodone-acetaminophen (VICODIN) 2.5-500 MG per tablet Take 1 tablet by mouth every 6 (six) hours as needed for pain. 08/05/13   Idalia Needle. [place], PA-C  MULTIPLE VITAMIN tablet Take 1 tablet by mouth daily.    Historical Provider, MD  nabumetone (RELAFEN) 750 MG tablet Take one tablet by mouth twice daily as needed  for arthritis 07/09/13   Biagio Borg, MD  nitrofurantoin, macrocrystal-monohydrate, (MACROBID) 100 MG capsule Take one capsule by mouth one time daily 07/09/13   Biagio Borg, MD  ondansetron (ZOFRAN) 4 MG tablet Take 4 mg by mouth every 8 (eight) hours as needed for nausea.    Historical Provider, MD  pantoprazole (PROTONIX) 40 MG tablet Take one tablet by mouth one time daily 07/09/13   Biagio Borg, MD  solifenacin (VESICARE) 10 MG tablet Take 10 mg by mouth daily.    Historical Provider, MD   BP 136/92  Pulse 71  Temp(Src) 97.6 F (36.4 C) (Oral)  Resp 14  Ht '5\' 8"'  (1.727 m)  Wt 225 lb (102.059 kg)  BMI 34.22 kg/m2  SpO2 100% Physical Exam  Musculoskeletal:  Nontender to shoulder elbow. Main area of pain is on the radial forearm midshaft. Full range of motion of the wrist. No soft tissue swelling. No navicular tenderness. Nontender over the metacarpals. Full range of motion shoulder and elbow.    ED Course  Procedures (including critical care time) Labs Review Labs Reviewed - No data to display  Imaging Review No results found.   EKG Interpretation None      MDM   Final diagnoses:  Wrist sprain    No fracture noted. Not tender over the navicular. Plan will be wrist splint. Ice. Tylenol or Motrin.    Tanna Furry, MD 03/02/14 [phone]

## 2014-03-02 NOTE — ED Notes (Signed)
Pt fell on tile floor due to wet shoes.  Pt injured right forearm.  No head injury or LOC.

## 2014-03-04 ENCOUNTER — Ambulatory Visit (INDEPENDENT_AMBULATORY_CARE_PROVIDER_SITE_OTHER): Payer: Federal, State, Local not specified - PPO | Admitting: *Deleted

## 2014-03-04 VITALS — BP 124/76 | HR 76 | Temp 97.6°F | Wt 231.6 lb

## 2014-03-04 DIAGNOSIS — N390 Urinary tract infection, site not specified: Secondary | ICD-10-CM

## 2014-03-04 NOTE — Progress Notes (Signed)
Pt here for TOC urine after treatment of UTI with Cipro 500 mg BID for 7 days. Pt feeling better with no urgency or bladder tenderness. Pt afebrile. Urine sent for culture. Will contact pt with results.

## 2014-03-06 LAB — URINE CULTURE
Colony Count: NO GROWTH
ORGANISM ID, BACTERIA: NO GROWTH

## 2014-03-20 ENCOUNTER — Telehealth: Payer: Self-pay | Admitting: Obstetrics & Gynecology

## 2014-03-20 ENCOUNTER — Encounter: Payer: Self-pay | Admitting: Obstetrics & Gynecology

## 2014-03-20 ENCOUNTER — Ambulatory Visit (INDEPENDENT_AMBULATORY_CARE_PROVIDER_SITE_OTHER): Payer: Federal, State, Local not specified - PPO | Admitting: Obstetrics & Gynecology

## 2014-03-20 VITALS — BP 124/78 | HR 64 | Resp 16 | Ht 67.0 in | Wt 230.0 lb

## 2014-03-20 DIAGNOSIS — Z23 Encounter for immunization: Secondary | ICD-10-CM

## 2014-03-20 DIAGNOSIS — Z124 Encounter for screening for malignant neoplasm of cervix: Secondary | ICD-10-CM

## 2014-03-20 DIAGNOSIS — Z01419 Encounter for gynecological examination (general) (routine) without abnormal findings: Secondary | ICD-10-CM

## 2014-03-20 MED ORDER — NITROFURANTOIN MONOHYD MACRO 100 MG PO CAPS: ORAL_CAPSULE | ORAL | Status: DC

## 2014-03-20 MED ORDER — ESTRADIOL 0.1 MG/GM VA CREA: TOPICAL_CREAM | VAGINAL | Status: DC

## 2014-03-20 MED ORDER — ESTROGENS CONJUGATED 0.625 MG PO TABS: 0.6250 mg | ORAL_TABLET | Freq: Every day | ORAL | Status: DC

## 2014-03-20 MED ORDER — FESOTERODINE FUMARATE ER 8 MG PO TB24: 8.0000 mg | ORAL_TABLET | Freq: Every day | ORAL | Status: DC

## 2014-03-20 NOTE — Telephone Encounter (Signed)
Spoke with patient and advised of mmg and bmd appointment at [facility] 05.19.2015 @0800 . Patient agreeable.

## 2014-03-20 NOTE — Progress Notes (Signed)
Patient ID: [name], female   DOB: [birth year], 63 y.o.   MRN: [id number]  62 y.o. G1P1001 MarriedCaucasianF here for annual exam.  Pt has chipped bone in right thumb and wearing a brace.  She sees ortho again next week--Dr. Noemi Chapel.    Having some numbness issues on the bottom of feet.  Will discuss with Dr. Jenny Reichmann. H/O low back issues.  Does have a bulging disc.  Denies pain.    Reports having a little pain with intercourse.  Feels like there is one place that is tender.  States she can get past it and then everything is fine.  Wonders if there is anything that should/could be done.  No LMP recorded. Patient has had a hysterectomy.          Sexually active: yes  The current method of family planning is status post hysterectomy.    Exercising: no  The patient does not participate in regular exercise at present. Smoker:  no  Health Maintenance: Pap:  11/24/10 WNL Abnormal Pap hx:  1986--TVH due to abnormal Pap smears MMG:  08/08/13, [facility]-mmg, recommended 6 month follow up, pt missed appt in 02/2014 for follow up.  She will call to schedule Colonoscopy:  12/11 repeat 10 years, Dr. Fuller Plan BMD:   2009 TDaP:   ?, pt will have today Screening Labs: declined, PCP, Hb today: PCP, Urine today: PCP   reports that she has never smoked. She has never used smokeless tobacco. She reports that she drinks alcohol. She reports that she does not use illicit drugs.  Past Medical History  Diagnosis Date  . GERD (gastroesophageal reflux disease)   . Arthritis   . Diverticulosis 02/11/2012  . OVERACTIVE BLADDER 08/10/2007    Qualifier: Diagnosis of  By: Sherlynn Stalls, CMA, [place]    . GERD 08/10/2007    Qualifier: Diagnosis of  By: Sherlynn Stalls, CMA, [place]    . ARTHRITIS, KNEE 02/10/2009    Qualifier: Diagnosis of  By: Niel Hummer MD, Baldemar Lenis, ATROPHIC 11/11/2008    Qualifier: Diagnosis of  By: Niel Hummer MD, Lorinda Creed HEMORRHOIDS-INTERNAL 07/21/2010    Qualifier: Diagnosis of  By: Chester Holstein NP, Nevin Bloodgood    .  Cervical cancer   . Allergic rhinitis, cause unspecified   . Chronic LBP   . Obesity   . Degenerative arthritis of hip 02/11/2012  . Cervical disc disease 02/11/2012  . Hyperlipidemia 02/13/2012  . Recurrent UTI   . Hiatal hernia   . GERD (gastroesophageal reflux disease)   . SUI (stress urinary incontinence, female)   . OAB (overactive bladder)   . BRCA negative 03/25/13  . Mitral valve prolapse   . Pneumonia   . PONV (postoperative nausea and vomiting)     Past Surgical History  Procedure Laterality Date  . Cervical discectomy  2003  . Bunionectomy  2007  . Shoulder arthroscopy  2004    frozen shoulder  . Tonsillectomy    . Cystocele repair  2003    rectocele repair  . Vaginal hysterectomy  1986  . Conization of cervix  1982    with D&C  . Eye surgery Bilateral     lasik  . Colonoscopy    . Cholecystectomy N/A 08/13/2013    Procedure: LAPAROSCOPIC CHOLECYSTECTOMY WITH INTRAOPERATIVE CHOLANGIOGRAM;  Surgeon: Shann Medal, MD;  Location: [facility];  Service: General;  Laterality: N/A;    Current Outpatient Prescriptions  Medication Sig Dispense Refill  . aspirin 81 MG  tablet Take 81 mg by mouth 3 (three) times a week. Monday Wednesday Friday      . estrogens, conjugated, (PREMARIN) 0.625 MG tablet Take 1 tablet (0.625 mg total) by mouth daily. Take daily for 21 days then do not take for 7 days.  30 tablet  13  . HYDROcodone-acetaminophen (VICODIN) 2.5-500 MG per tablet Take 1 tablet by mouth every 6 (six) hours as needed for pain.  30 tablet  0  . MULTIPLE VITAMIN tablet Take 1 tablet by mouth daily.      . nabumetone (RELAFEN) 750 MG tablet Take one tablet by mouth twice daily as needed  for arthritis  60 tablet  4  . nitrofurantoin, macrocrystal-monohydrate, (MACROBID) 100 MG capsule Take one capsule by mouth one time daily  30 capsule  11  . ondansetron (ZOFRAN) 4 MG tablet Take 4 mg by mouth every 8 (eight) hours as needed for nausea.      . pantoprazole (PROTONIX) 40 MG  tablet Take one tablet by mouth one time daily  30 tablet  11  . solifenacin (VESICARE) 10 MG tablet Take 10 mg by mouth daily.       No current facility-administered medications for this visit.    Family History  Problem Relation Age of Onset  . Breast cancer Mother   . Heart disease Sister   . Breast cancer Sister   . Diabetes Brother   . Heart disease Brother   . Diabetes Maternal Aunt   . Diabetes Maternal Uncle   . Cancer Maternal Grandmother     uterine    ROS:  Pertinent items are noted in HPI.  Otherwise, a comprehensive ROS was negative.  Exam:   BP 124/78  Pulse 64  Resp 16  Ht '5\' 7"'  (1.702 m)  Wt 230 lb (104.327 kg)  BMI 36.01 kg/m2  Weight change: '@WEIGHTCHANGE' @ Height:   Height: '5\' 7"'  (170.2 cm)  Ht Readings from Last 3 Encounters:  03/20/14 '5\' 7"'  (1.702 m)  03/02/14 '5\' 8"'  (1.727 m)  02/19/14 '5\' 7"'  (1.702 m)    General appearance: alert, cooperative and appears stated age Head: Normocephalic, without obvious abnormality, atraumatic Neck: no adenopathy, supple, symmetrical, trachea midline and thyroid normal to inspection and palpation Lungs: clear to auscultation bilaterally Breasts: normal appearance, no masses or tenderness Heart: regular rate and rhythm Abdomen: soft, non-tender; bowel sounds normal; no masses,  no organomegaly Extremities: extremities normal, atraumatic, no cyanosis or edema Skin: Skin color, texture, turgor normal. No rashes or lesions Lymph nodes: Cervical, supraclavicular, and axillary nodes normal. No abnormal inguinal nodes palpated Neurologic: Grossly normal   Pelvic: External genitalia:  no lesions              Urethra:  normal appearing urethra with no masses, tenderness or lesions              Bartholins and Skenes: normal                 Vagina: normal appearing vagina with normal color and discharge, no lesions, tightness on right side along cuff where scarring is present              Cervix: absent              Pap  taken: yes Bimanual Exam:  Uterus:  uterus absent              Adnexa: normal adnexa and no mass, fullness, tenderness  Rectovaginal: Confirms               Anus:  normal sphincter tone, no lesions  A:   Well Woman with normal exam  Strong family hx of breast cancer--mother and sister .  Negative BRCA testing. H/o TVH due to abnormal paps 1986.   OAB  Elevated cholesterol Recurrent UTI Due follow up for prior abnormal MMG.  Missed appt last month. Elevated liver enzyme.  Dr. Jenny Reichmann is PCP.  Has appt scheduled. Cuff scarring causing pain    P:   Mammogram due.  6 month follow-up was missed by pt.  Will scheduled follow up for her.  BMD due.  Will schedule for pt with MMG Switch to Premarin 0.625 due to [place] patch not sticking. Rx to pharmacy.  BRCA testing today  [place] 23m.  Has tried Enablex--expensive.  Tried Vesicare--didn't work as well for pt. macrobid 1056mdaily.  Rx to pharmacy. Pap smear obtained. H/O TVH due to abnormal Paps.  Trial of estrogen vaginal cream 1 gram pv 1-2 times weekly.  Rx to pharmacy.  If this doesn't help, will have pt use dilator. return annually or prn   An After Visit Summary was printed and given to the patient.

## 2014-03-20 NOTE — Patient Instructions (Signed)

## 2014-03-20 NOTE — Telephone Encounter (Signed)
Patient is scheduled for mmg and bmd 05.19.2015 @ 0800.

## 2014-03-21 LAB — IPS PAP SMEAR ONLY

## 2014-03-25 LAB — HM MAMMOGRAPHY

## 2014-03-27 ENCOUNTER — Telehealth: Payer: Self-pay

## 2014-03-27 NOTE — Telephone Encounter (Signed)
Patient notified of BMD results-results to be scanned in epic//kn

## 2014-04-09 ENCOUNTER — Telehealth: Payer: Self-pay

## 2014-04-09 ENCOUNTER — Encounter: Payer: Self-pay | Admitting: Internal Medicine

## 2014-04-09 DIAGNOSIS — Z0000 Encounter for general adult medical examination without abnormal findings: Secondary | ICD-10-CM

## 2014-04-09 NOTE — Telephone Encounter (Signed)
Labs entered.

## 2014-07-08 ENCOUNTER — Other Ambulatory Visit (INDEPENDENT_AMBULATORY_CARE_PROVIDER_SITE_OTHER): Payer: Federal, State, Local not specified - PPO

## 2014-07-08 DIAGNOSIS — Z0000 Encounter for general adult medical examination without abnormal findings: Secondary | ICD-10-CM

## 2014-07-08 LAB — CBC WITH DIFFERENTIAL/PLATELET
Basophils Absolute: 0.1 10*3/uL (ref 0.0–0.1)
Basophils Relative: 0.7 % (ref 0.0–3.0)
EOS PCT: 3.4 % (ref 0.0–5.0)
Eosinophils Absolute: 0.3 10*3/uL (ref 0.0–0.7)
HEMATOCRIT: 40.4 % (ref 36.0–46.0)
HEMOGLOBIN: 13.7 g/dL (ref 12.0–15.0)
LYMPHS ABS: 3.7 10*3/uL (ref 0.7–4.0)
Lymphocytes Relative: 42.1 % (ref 12.0–46.0)
MCHC: 34 g/dL (ref 30.0–36.0)
MCV: 88.3 fl (ref 78.0–100.0)
MONOS PCT: 5.7 % (ref 3.0–12.0)
Monocytes Absolute: 0.5 10*3/uL (ref 0.1–1.0)
NEUTROS ABS: 4.2 10*3/uL (ref 1.4–7.7)
Neutrophils Relative %: 48.1 % (ref 43.0–77.0)
Platelets: 238 10*3/uL (ref 150.0–400.0)
RBC: 4.57 Mil/uL (ref 3.87–5.11)
RDW: 13 % (ref 11.5–15.5)
WBC: 8.7 10*3/uL (ref 4.0–10.5)

## 2014-07-08 LAB — URINALYSIS, ROUTINE W REFLEX MICROSCOPIC
BILIRUBIN URINE: NEGATIVE
Ketones, ur: NEGATIVE
Leukocytes, UA: NEGATIVE
Nitrite: NEGATIVE
Specific Gravity, Urine: <=1.005 — AB (ref 1.000–1.030)
TOTAL PROTEIN, URINE-UPE24: NEGATIVE
URINE GLUCOSE: NEGATIVE
Urobilinogen, UA: 0.2 (ref 0.0–1.0)
pH: 6.5 (ref 5.0–8.0)

## 2014-07-08 LAB — HEPATIC FUNCTION PANEL
ALBUMIN: 3.7 g/dL (ref 3.5–5.2)
ALK PHOS: 86 U/L (ref 39–117)
ALT: 18 U/L (ref 0–35)
AST: 19 U/L (ref 0–37)
BILIRUBIN DIRECT: 0.2 mg/dL (ref 0.0–0.3)
TOTAL PROTEIN: 7 g/dL (ref 6.0–8.3)
Total Bilirubin: 1.2 mg/dL (ref 0.2–1.2)

## 2014-07-08 LAB — LIPID PANEL
CHOL/HDL RATIO: 4
CHOLESTEROL: 172 mg/dL (ref 0–200)
HDL: 42.4 mg/dL (ref >39.00)
LDL Cholesterol: 108 mg/dL — ABNORMAL HIGH (ref 0–99)
NonHDL: 129.6
TRIGLYCERIDES: 109 mg/dL (ref 0.0–149.0)
VLDL: 21.8 mg/dL (ref 0.0–40.0)

## 2014-07-08 LAB — TSH: TSH: 3.01 u[IU]/mL (ref 0.35–4.50)

## 2014-07-08 LAB — BASIC METABOLIC PANEL
BUN: 13 mg/dL (ref 6–23)
CHLORIDE: 107 meq/L (ref 96–112)
CO2: 27 meq/L (ref 19–32)
CREATININE: 0.9 mg/dL (ref 0.4–1.2)
Calcium: 9.4 mg/dL (ref 8.4–10.5)
GFR: 70.41 mL/min (ref >60.00)
Glucose, Bld: 100 mg/dL — ABNORMAL HIGH (ref 70–99)
Potassium: 3.9 mEq/L (ref 3.5–5.1)
SODIUM: 141 meq/L (ref 135–145)

## 2014-07-11 ENCOUNTER — Encounter: Payer: Federal, State, Local not specified - PPO | Admitting: Internal Medicine

## 2014-07-18 ENCOUNTER — Encounter: Payer: Self-pay | Admitting: Internal Medicine

## 2014-07-18 ENCOUNTER — Ambulatory Visit (INDEPENDENT_AMBULATORY_CARE_PROVIDER_SITE_OTHER): Payer: Federal, State, Local not specified - PPO | Admitting: Internal Medicine

## 2014-07-18 VITALS — BP 110/68 | HR 109 | Temp 97.8°F | Ht 67.0 in | Wt 229.4 lb

## 2014-07-18 DIAGNOSIS — Z23 Encounter for immunization: Secondary | ICD-10-CM

## 2014-07-18 DIAGNOSIS — Z0000 Encounter for general adult medical examination without abnormal findings: Secondary | ICD-10-CM

## 2014-07-18 MED ORDER — NITROFURANTOIN MONOHYD MACRO 100 MG PO CAPS: ORAL_CAPSULE | ORAL | Status: DC

## 2014-07-18 MED ORDER — HYDROCODONE-ACETAMINOPHEN 2.5-500 MG PO TABS
1.0000 | ORAL_TABLET | Freq: Four times a day (QID) | ORAL | Status: DC | PRN
Start: 2014-07-18 — End: 2014-07-22

## 2014-07-18 MED ORDER — NABUMETONE 750 MG PO TABS: ORAL_TABLET | ORAL | Status: DC

## 2014-07-18 NOTE — Progress Notes (Signed)
Subjective:    Patient ID: [name], female    DOB: Aug 17, 1951, 63 y.o.   MRN: [id number]  HPI  Here for wellness and f/u;  Overall doing ok;  Pt denies CP, worsening SOB, DOE, wheezing, orthopnea, PND, worsening LE edema, palpitations, dizziness or syncope.  Pt denies neurological change such as new headache, facial or extremity weakness.  Pt denies polydipsia, polyuria, or low sugar symptoms. Pt states overall good compliance with treatment and medications, good tolerability, and has been trying to follow lower cholesterol diet.  Pt denies worsening depressive symptoms, suicidal ideation or panic. No fever, night sweats, wt loss, loss of appetite, or other constitutional symptoms.  Pt states good ability with ADL's, has low fall risk, home safety reviewed and adequate, no other significant changes in hearing or vision, and only occasionally active with exercise. Needs med refills, no other complaints Past Medical History  Diagnosis Date  . GERD (gastroesophageal reflux disease)   . Arthritis   . Diverticulosis 02/11/2012  . OVERACTIVE BLADDER 08/10/2007    Qualifier: Diagnosis of  By: Sherlynn Stalls, CMA, [place]    . GERD 08/10/2007    Qualifier: Diagnosis of  By: Sherlynn Stalls, CMA, [place]    . ARTHRITIS, KNEE 02/10/2009    Qualifier: Diagnosis of  By: Niel Hummer MD, Baldemar Lenis, ATROPHIC 11/11/2008    Qualifier: Diagnosis of  By: Niel Hummer MD, Lorinda Creed HEMORRHOIDS-INTERNAL 07/21/2010    Qualifier: Diagnosis of  By: Chester Holstein NP, Nevin Bloodgood    . Cervical cancer   . Allergic rhinitis, cause unspecified   . Chronic LBP   . Obesity   . Degenerative arthritis of hip 02/11/2012  . Cervical disc disease 02/11/2012  . Hyperlipidemia 02/13/2012  . Recurrent UTI   . Hiatal hernia   . GERD (gastroesophageal reflux disease)   . SUI (stress urinary incontinence, female)   . OAB (overactive bladder)   . BRCA negative 03/25/13  . Mitral valve prolapse   . Pneumonia   . PONV (postoperative nausea and  vomiting)    Past Surgical History  Procedure Laterality Date  . Cervical discectomy  2003  . Bunionectomy  2007  . Shoulder arthroscopy  2004    frozen shoulder  . Tonsillectomy    . Cystocele repair  2003    rectocele repair  . Vaginal hysterectomy  1986  . Conization of cervix  1982    with D&C  . Eye surgery Bilateral     lasik  . Colonoscopy    . Cholecystectomy N/A 08/13/2013    Procedure: LAPAROSCOPIC CHOLECYSTECTOMY WITH INTRAOPERATIVE CHOLANGIOGRAM;  Surgeon: Shann Medal, MD;  Location: [place];  Service: General;  Laterality: N/A;    reports that she has never smoked. She has never used smokeless tobacco. She reports that she drinks alcohol. She reports that she does not use illicit drugs. family history includes Breast cancer in her mother and sister; Cancer in her maternal grandmother; Diabetes in her brother, maternal aunt, and maternal uncle; Heart disease in her brother and sister. Allergies  Allergen Reactions  . Amoxicillin     REACTION: unspecified  . Penicillins Other (See Comments)    Unknown reaction   Current Outpatient Prescriptions on File Prior to Visit  Medication Sig Dispense Refill  . aspirin 81 MG tablet Take 81 mg by mouth 3 (three) times a week. Monday Wednesday Friday      . estradiol (ESTRACE) 0.1 MG/GM vaginal cream 1 gram pv  1-2 times weekly  42.5 g  2  . estrogens, conjugated, (PREMARIN) 0.625 MG tablet Take 1 tablet (0.625 mg total) by mouth daily.  30 tablet  13  . fesoterodine (TOVIAZ) 8 MG TB24 tablet Take 1 tablet (8 mg total) by mouth daily.  30 tablet  13  . MULTIPLE VITAMIN tablet Take 1 tablet by mouth daily.      . pantoprazole (PROTONIX) 40 MG tablet Take one tablet by mouth one time daily  30 tablet  11   No current facility-administered medications on file prior to visit.   Review of Systems Constitutional: Negative for increased diaphoresis, other activity, appetite or other siginficant weight change  HENT: Negative for  worsening hearing loss, ear pain, facial swelling, mouth sores and neck stiffness.   Eyes: Negative for other worsening pain, redness or visual disturbance.  Respiratory: Negative for shortness of breath and wheezing.   Cardiovascular: Negative for chest pain and palpitations.  Gastrointestinal: Negative for diarrhea, blood in stool, abdominal distention or other pain Genitourinary: Negative for hematuria, flank pain or change in urine volume.  Musculoskeletal: Negative for myalgias or other joint complaints.  Skin: Negative for color change and wound.  Neurological: Negative for syncope and numbness. other than noted Hematological: Negative for adenopathy. or other swelling Psychiatric/Behavioral: Negative for hallucinations, self-injury, decreased concentration or other worsening agitation.      Objective:   Physical Exam BP 110/68  Pulse 109  Temp(Src) 97.8 F (36.6 C) (Oral)  Ht _0  (1.702 m)  Wt 229 lb 6 oz (104.044 kg)  BMI 35.92 kg/m2  SpO2 94% VS noted,  Constitutional: Pt is oriented to person, place, and time. Appears well-developed and well-nourished.  Head: Normocephalic and atraumatic.  Right Ear: External ear normal.  Left Ear: External ear normal.  Nose: Nose normal.  Mouth/Throat: Oropharynx is clear and moist.  Eyes: Conjunctivae and EOM are normal. Pupils are equal, round, and reactive to light.  Neck: Normal range of motion. Neck supple. No JVD present. No tracheal deviation present.  Cardiovascular: Normal rate, regular rhythm, normal heart sounds and intact distal pulses.   Pulmonary/Chest: Effort normal and breath sounds without rales or wheezing  Abdominal: Soft. Bowel sounds are normal. NT. No HSM  Musculoskeletal: Normal range of motion. Exhibits no edema.  Lymphadenopathy:  Has no cervical adenopathy.  Neurological: Pt is alert and oriented to person, place, and time. Pt has normal reflexes. No cranial nerve deficit. Motor grossly intact Skin: Skin is  warm and dry. No rash noted.  Psychiatric:  Has mild nervous mood and affect. Behavior is normal.     Assessment & Plan:   Wt Readings from Last 3 Encounters:  07/18/14 229 lb 6 oz (104.044 kg)  03/20/14 230 lb (104.327 kg)  03/04/14 231 lb 9.6 oz (105.053 kg)

## 2014-07-18 NOTE — Progress Notes (Signed)
Pre visit review using our clinic review tool, if applicable. No additional management support is needed unless otherwise documented below in the visit note. 

## 2014-07-18 NOTE — Patient Instructions (Addendum)

## 2014-07-19 NOTE — Assessment & Plan Note (Signed)

## 2014-07-21 ENCOUNTER — Telehealth: Payer: Self-pay | Admitting: Internal Medicine

## 2014-07-22 MED ORDER — HYDROCODONE-ACETAMINOPHEN 5-325 MG PO TABS: 1.0000 | ORAL_TABLET | Freq: Four times a day (QID) | ORAL | Status: DC | PRN

## 2014-07-22 NOTE — Telephone Encounter (Signed)
Left msg on triage stating received script for hydrocodone 2.5/500 mg. Does not come in that strength. Pls advise...[name]

## 2014-07-22 NOTE — Telephone Encounter (Signed)
Called [facility] pharmacy is closed for lunch will retry...[name]

## 2014-07-22 NOTE — Telephone Encounter (Signed)
Notified pharmacy spoke with Ron gave him md response...Johny Chess

## 2014-07-22 NOTE — Telephone Encounter (Signed)
rx corrected, re-done  Done hardcopy to robin

## 2014-08-04 ENCOUNTER — Ambulatory Visit (INDEPENDENT_AMBULATORY_CARE_PROVIDER_SITE_OTHER)
Admission: RE | Admit: 2014-08-04 | Discharge: 2014-08-04 | Disposition: A | Discharge status: Home / Self Care | Payer: Federal, State, Local not specified - PPO | Source: Ambulatory Visit | Attending: Family Medicine | Admitting: Family Medicine

## 2014-08-04 ENCOUNTER — Ambulatory Visit (INDEPENDENT_AMBULATORY_CARE_PROVIDER_SITE_OTHER): Payer: Federal, State, Local not specified - PPO | Admitting: Family Medicine

## 2014-08-04 ENCOUNTER — Encounter: Payer: Self-pay | Admitting: Family Medicine

## 2014-08-04 VITALS — BP 108/64 | HR 73 | Ht 67.0 in | Wt 223.0 lb

## 2014-08-04 DIAGNOSIS — M25551 Pain in right hip: Secondary | ICD-10-CM

## 2014-08-04 DIAGNOSIS — M25569 Pain in unspecified knee: Secondary | ICD-10-CM

## 2014-08-04 DIAGNOSIS — M76899 Other specified enthesopathies of unspecified lower limb, excluding foot: Secondary | ICD-10-CM

## 2014-08-04 DIAGNOSIS — M25561 Pain in right knee: Secondary | ICD-10-CM

## 2014-08-04 DIAGNOSIS — M1710 Unilateral primary osteoarthritis, unspecified knee: Secondary | ICD-10-CM | POA: Insufficient documentation

## 2014-08-04 DIAGNOSIS — M25559 Pain in unspecified hip: Secondary | ICD-10-CM

## 2014-08-04 DIAGNOSIS — M7061 Trochanteric bursitis, right hip: Secondary | ICD-10-CM | POA: Insufficient documentation

## 2014-08-04 NOTE — Patient Instructions (Addendum)
Good to see you.  Xrays downstairs today.  Wear brace with activity.  Ice 20 minutes 2 times daily. Usually after activity and before bed. Exercises 3 times a week.  Take tylenol 650 mg three times a day is the best evidence based medicine we have for arthritis.  Glucosamine sulfate 750mg  twice a day is a supplement that has been shown to help moderate to severe arthritis. Vitamin D 2000 IU daily Fish oil 2 grams daily.  Tumeric 500mg  twice daily.  Capsaicin topically up to four times a day may also help with pain. Cortisone injections are an option if these interventions do not seem to make a difference or need more relief.  If cortisone injections do not help, there are different types of shots that may help but they take longer to take effect.  We can discuss this at follow up.  It's important that you continue to stay active. Controlling your weight is important.  Consider physical therapy to strengthen muscles around the joint that hurts to take pressure off of the joint itself. Shoe inserts with good arch support may be helpful.  Spenco orthotics online look for total support.  Water aerobics and cycling with low resistance are the best two types of exercise for arthritis. Come back and see me in 3 weeks.

## 2014-08-04 NOTE — Progress Notes (Signed)
[name] [place], [place] [address] Phone: [phone] Subjective:    I'm seeing this patient by the request  of:  Cathlean Cower, MD   CC: Right hip and right knee pain  [id number] [name] is a 63 y.o. female coming in with complaint of right hip and right knee pain. Patient states that most of her pain seems to be on the lateral aspect of her hip and knee. Patient states it is worse with activity. Patient states though that she is walking she can get a considerable amount of pain in her groin area it seems to last seconds and seems to go away. Patient denies any weakness but states it does seem to radiate down the lateral aspect of her leg. Denies any back pain with this. Denies any fevers or chills or any abnormal weight loss. Still able to do daily activities. States though that it is starting to wake her up at night if he rolls over onto that side. Patient rates the severity of 3/10. Describes it is more of a dull aching sensation.    x-rays were ordered reviewed and interpreted by me today. X-rays the patient's right hip showed no significant bony abnormality.  Patient also had 4 views of the right knee showing mild medial osteophytic changes.  Past medical history, social, surgical and family history all reviewed in electronic medical record.   Review of Systems: No headache, visual changes, nausea, vomiting, diarrhea, constipation, dizziness, abdominal pain, skin rash, fevers, chills, night sweats, weight loss, swollen lymph nodes, body aches, joint swelling, muscle aches, chest pain, shortness of breath, mood changes.   Objective Blood pressure 108/64, pulse 73, height 5\' 7"  (1.702 m), weight 223 lb (101.152 kg), SpO2 96.00%.  General: No apparent distress alert and oriented x3 mood and affect normal, dressed appropriately. Overweight HEENT: Pupils equal, extraocular movements intact  Respiratory: Patient's speak in full sentences  and does not appear short of breath  Cardiovascular: No lower extremity edema, non tender, no erythema  Skin: Warm dry intact with no signs of infection or rash on extremities or on axial skeleton.  Abdomen: Soft nontender  Neuro: Cranial nerves II through XII are intact, neurovascularly intact in all extremities with 2+ DTRs and 2+ pulses.  Lymph: No lymphadenopathy of posterior or anterior cervical chain or axillae bilaterally.  Gait normal with good balance and coordination.  MSK:  Non tender with full range of motion and good stability and symmetric strength and tone of shoulders, elbows, wrist,  and ankles bilaterally.  Hip: Right ROM IR: 35 Deg, ER: 45 Deg, Flexion: 120 Deg, Extension: 100 Deg, Abduction: 45 Deg, Adduction: 45 Deg Strength IR: 3/5, ER: 5/5, Flexion: 5/5, Extension: 5/5, Abduction: 4/5, Adduction: 5/5 Pelvic alignment unremarkable to inspection and palpation. Standing hip rotation and gait without trendelenburg sign / unsteadiness. Greater trochanter with tenderness to palpation No tenderness over piriformis   positive Corky Sox been negative pain with internal rotation No SI joint tenderness and normal minimal SI movement. Contralateral hip unremarkable   Knee: Right  Normal to inspection with no erythema or effusion or obvious bony abnormalities. R. tenderness to palpation over the medial joint line ROM full in flexion and extension and lower leg rotation. Ligaments with solid consistent endpoints including ACL, PCL, LCL, MCL. Negative Mcmurray's, Apley's, and Thessalonian tests. Non painful patellar compression. Patellar glide with mild crepitus. Patellar and quadriceps tendons unremarkable. Hamstring and quadriceps strength is normal.  Contralateral knee unremarkable  Impression and Recommendations:     This case required medical decision making of moderate complexity.

## 2014-08-04 NOTE — Assessment & Plan Note (Signed)
Patient does have some greater trochanteric bursitis I think is giving her trouble. We discussed home exercise as well as icing protocol that will be beneficial. Patient elected not to have a corticosteroid injection today. Patient is to try the conservative therapy including over-the-counter medicine and then come back and see me again in 3 weeks. Continuing of pain I would consider doing injection. No bony abnormality was noted on x-ray today.

## 2014-08-04 NOTE — Assessment & Plan Note (Signed)
Patient does have mild osteoarthritic changes it could be contributing to her pain. No specific meniscal signs noted today we will keep him in the differential. Patient continues to have difficulty will do an ultrasound of the knee. Patient was given a brace was fitted by me today. We discussed icing protocol that'll be helpful. Patient will try some over-the-counter medicines as well as a trial of a topical anti-inflammatory. Patient will come back and see me again in 3 weeks if continued have pain we'll consider corticosteroid injection.

## 2014-08-25 ENCOUNTER — Ambulatory Visit (INDEPENDENT_AMBULATORY_CARE_PROVIDER_SITE_OTHER): Payer: Federal, State, Local not specified - PPO | Admitting: Family Medicine

## 2014-08-25 ENCOUNTER — Encounter: Payer: Self-pay | Admitting: Family Medicine

## 2014-08-25 VITALS — BP 94/72 | HR 87 | Ht 67.0 in | Wt 232.0 lb

## 2014-08-25 DIAGNOSIS — M1710 Unilateral primary osteoarthritis, unspecified knee: Secondary | ICD-10-CM

## 2014-08-25 DIAGNOSIS — M7061 Trochanteric bursitis, right hip: Secondary | ICD-10-CM

## 2014-08-25 DIAGNOSIS — M179 Osteoarthritis of knee, unspecified: Secondary | ICD-10-CM

## 2014-08-25 NOTE — Patient Instructions (Signed)
Good to see you Continue the brace with a lot of activity Try the pennsaid on the side of the leg Ice is your friend.  Come back and see me in 3-4 weeks or sooner if worse.  We can do PT or injections if needed and have a lot of tricks as well.

## 2014-08-25 NOTE — Progress Notes (Signed)
  [facility] [place], [place] [address] Phone: [phone] Subjective:      CC: Right hip and right knee pain  [id number] [name] is a 63 y.o. female coming in with complaint of right hip and right knee pain. Patient states that her lateral hip pain is significantly improved. Patient states that she is still having some mild pain radiating down her leg and is tender to palpation but this is not stopping her from any activity. Patient states that she is about 75-80% better. She would like to avoid any type shot.  Patient is wearing the brace for her knee. Patient states that overall she is about 75-80% better and her knee as well. Patient states that she is able to do any activity that she would like now which makes her very happy. Patient describes a dull aching pain that does respond well to the over-the-counter medications as well as the bracing and icing protocol. Patient feels that she continues doing what she is doing she will continue to improve.     Past medical history, social, surgical and family history all reviewed in electronic medical record.   Review of Systems: No headache, visual changes, nausea, vomiting, diarrhea, constipation, dizziness, abdominal pain, skin rash, fevers, chills, night sweats, weight loss, swollen lymph nodes, body aches, joint swelling, muscle aches, chest pain, shortness of breath, mood changes.   Objective Blood pressure 94/72, pulse 87, height 5\' 7"  (1.702 m), weight 232 lb (105.235 kg), SpO2 96.00%.  General: No apparent distress alert and oriented x3 mood and affect normal, dressed appropriately. Overweight HEENT: Pupils equal, extraocular movements intact  Respiratory: Patient's speak in full sentences and does not appear short of breath  Cardiovascular: No lower extremity edema, non tender, no erythema  Skin: Warm dry intact with no signs of infection or rash on extremities or on axial skeleton.    Abdomen: Soft nontender  Neuro: Cranial nerves II through XII are intact, neurovascularly intact in all extremities with 2+ DTRs and 2+ pulses.  Lymph: No lymphadenopathy of posterior or anterior cervical chain or axillae bilaterally.  Gait normal with good balance and coordination.  MSK:  Non tender with full range of motion and good stability and symmetric strength and tone of shoulders, elbows, wrist,  and ankles bilaterally.  Hip: Right ROM IR: 35 Deg, ER: 45 Deg, Flexion: 120 Deg, Extension: 100 Deg, Abduction: 45 Deg, Adduction: 45 Deg Strength IR: 3/5, ER: 5/5, Flexion: 5/5, Extension: 5/5, Abduction: 4/5, Adduction: 5/5 Pelvic alignment unremarkable to inspection and palpation. Standing hip rotation and gait without trendelenburg sign / unsteadiness. Greater trochanter minimal tenderness but improve No tenderness over piriformis   positive Corky Sox but  negative pain with internal rotation No SI joint tenderness and normal minimal SI movement. Contralateral hip unremarkable   Knee: Right  Normal to inspection with no erythema or effusion or obvious bony abnormalities. Continued mild tenderness over the right knee on the medial aspect ROM full in flexion and extension and lower leg rotation. Ligaments with solid consistent endpoints including ACL, PCL, LCL, MCL. Negative Mcmurray's, Apley's, and Thessalonian tests. Non painful patellar compression. Patellar glide with mild crepitus. Patellar and quadriceps tendons unremarkable. Hamstring and quadriceps strength is normal.  Contralateral knee unremarkable Mild improvement from previous exam.   Impression and Recommendations:     This case required medical decision making of moderate complexity.

## 2014-08-25 NOTE — Assessment & Plan Note (Signed)
Patient is doing remarkably well with conservative therapy at this time. We discussed continuing the bracing is needed. Patient will continue with the topical anti-inflammatories a. Patient will come back and see me again in 3-4 weeks if continued pain. We discussed the importance of weight loss as well as different exercises I will be beneficial. Spent greater than 25 minutes with patient face-to-face and had greater than 50% of counseling including as described above in assessment and plan.

## 2014-08-25 NOTE — Assessment & Plan Note (Signed)
Patient states that she is happy with the conservative therapy at this time. We discussed an icing regimen. We discussed continuing the home exercises and we discussed proper shoe wear that can be beneficial. We discussed the importance of hip abductor strengthening. We discussed an icing regimen as well. Patient will follow up with me again in 3-4 weeks.

## 2014-09-08 ENCOUNTER — Encounter: Payer: Self-pay | Admitting: Family Medicine

## 2014-09-12 ENCOUNTER — Telehealth: Payer: Self-pay | Admitting: Obstetrics & Gynecology

## 2014-09-12 NOTE — Telephone Encounter (Signed)
Message left to return call to [facility] at [phone].   Patient with hx of recurrent UTI's.  Takes Macrobid 100 mg po daily.

## 2014-09-12 NOTE — Telephone Encounter (Signed)
Patient wants an appointment today. She thinks she may have a urinary tract infection.

## 2014-09-12 NOTE — Telephone Encounter (Addendum)
Patient returned call. Having two days of pelvic pressure. Patient reports this is like her prior UTI's. No dsyuria, no abdominal pain, no fevers, no flank pain, no vaginal discharge.  Patient has not been taking her prophylactic Macrobid.   Advised would discuss with provider and return call with disposition  1021: Reviewed with Dr. Quincy Simmonds, patient symptoms and hx.  Orders received for patient to take Macrobid 100 mg po bid x 5 days. Follow up if no improvement or worsening in symptoms. Patient given instructions to follow up in urgent care if any symptoms increase or develops fevers, flanks pain, nausea, vomiting or any other concern. Patient is agreeable to this. Declines office visit offered, she feels she is okay to try these instructions and follow up prn. Routing to Dr. Quincy Simmonds to review.       Cc Dr. Sabra Heck

## 2014-09-12 NOTE — Telephone Encounter (Signed)
Closed note in error. Please contact patient.

## 2014-09-23 ENCOUNTER — Ambulatory Visit: Payer: Federal, State, Local not specified - PPO | Admitting: Family Medicine

## 2014-11-24 ENCOUNTER — Encounter: Payer: Self-pay | Admitting: Family Medicine

## 2014-11-24 ENCOUNTER — Ambulatory Visit (INDEPENDENT_AMBULATORY_CARE_PROVIDER_SITE_OTHER): Payer: Federal, State, Local not specified - PPO | Admitting: Family Medicine

## 2014-11-24 ENCOUNTER — Other Ambulatory Visit (INDEPENDENT_AMBULATORY_CARE_PROVIDER_SITE_OTHER): Payer: Federal, State, Local not specified - PPO

## 2014-11-24 VITALS — BP 112/80 | HR 82 | Ht 67.0 in | Wt 229.0 lb

## 2014-11-24 DIAGNOSIS — M7061 Trochanteric bursitis, right hip: Secondary | ICD-10-CM

## 2014-11-24 DIAGNOSIS — M25551 Pain in right hip: Secondary | ICD-10-CM

## 2014-11-24 NOTE — Assessment & Plan Note (Signed)
Patient was given an injection today with fairly good resolution of pain. Patient also work with the Product/process development scientist today. We discussed icing regimen and home exercises. We discussed the importance of hip abductor strengthening. Patient then will come back again in 3 weeks for further evaluation. Patient elected to not try formal physical therapy. Patient was given another trial of topical anti-inflammatories a try. If patient continues to have pain I would consider x-rays of the hip and back to rule out any bony abnormality that could be contributing.

## 2014-11-24 NOTE — Progress Notes (Signed)
[facility] [place],  [address] Phone: [phone] Subjective:      CC: Right hip and right knee pain  NID:[id number] [name] is a 64 y.o. female coming in with complaint of right hip and right knee pain. Patient states that her lateral hip pain is significantly improved. Patient was seen previously and did have greater trochanteric bursitis as well as a distal iliotibial band. Patient states she was doing fairly well but the pain is starting to increase somewhat. Patient states now it is starting to wake her up at night which is concerning. Patient denies any weakness but states that there can be a numbness on the lateral aspect. Patient denies any back pain associated with this.  Past medical history, social, surgical and family history all reviewed in electronic medical record.   Review of Systems: No headache, visual changes, nausea, vomiting, diarrhea, constipation, dizziness, abdominal pain, skin rash, fevers, chills, night sweats, weight loss, swollen lymph nodes, body aches, joint swelling, muscle aches, chest pain, shortness of breath, mood changes.   Objective Blood pressure 112/80, pulse 82, height 5\' 7"  (1.702 m), weight 229 lb (103.874 kg), SpO2 95 %.  General: No apparent distress alert and oriented x3 mood and affect normal, dressed appropriately. Overweight HEENT: Pupils equal, extraocular movements intact  Respiratory: Patient's speak in full sentences and does not appear short of breath  Cardiovascular: No lower extremity edema, non tender, no erythema  Skin: Warm dry intact with no signs of infection or rash on extremities or on axial skeleton.  Abdomen: Soft nontender  Neuro: Cranial nerves II through XII are intact, neurovascularly intact in all extremities with 2+ DTRs and 2+ pulses.  Lymph: No lymphadenopathy of posterior or anterior cervical chain or axillae bilaterally.  Gait normal with good balance and  coordination.  MSK:  Non tender with full range of motion and good stability and symmetric strength and tone of shoulders, elbows, wrist,  and ankles bilaterally.  Hip: Right ROM IR: 35 Deg, ER: 45 Deg, Flexion: 120 Deg, Extension: 100 Deg, Abduction: 45 Deg, Adduction: 45 Deg Strength IR: 3/5, ER: 5/5, Flexion: 5/5, Extension: 5/5, Abduction: 4/5, Adduction: 5/5 Pelvic alignment unremarkable to inspection and palpation. Standing hip rotation and gait without trendelenburg sign / unsteadiness. Greater trochanter severe tenderness No tenderness over piriformis   positive Corky Sox but  negative pain with internal rotation No SI joint tenderness and normal minimal SI movement. Contralateral hip unremarkable  MSK US performed of: Right This study was ordered, performed, and interpreted by Charlann Boxer D.O.  Hip: Trochanteric bursa with significant hypoechoic changes and swelling Acetabular labrum visualized and without tears, displacement, or effusion in joint. Femoral neck appears unremarkable without increased power doppler signal along Cortex.  IMPRESSION:  Greater trochanter bursitis   Procedure: Real-time Ultrasound Guided Injection of right greater trochanteric bursitis secondary to patient's body habitus Device: GE Logiq E  Ultrasound guided injection is preferred based studies that show increased duration, increased effect, greater accuracy, decreased procedural pain, increased response rate, and decreased cost with ultrasound guided versus blind injection.  Verbal informed consent obtained.  Time-out conducted.  Noted no overlying erythema, induration, or other signs of local infection.  Skin prepped in a sterile fashion.  Local anesthesia: Topical Ethyl chloride.  With sterile technique and under real time ultrasound guidance:  Greater trochanteric area was visualized and patient's bursa was noted. A 22-gauge 3 inch needle was inserted and 4 cc of 0.5% Marcaine  and 1 cc of Kenalog  40 mg/dL was injected. Pictures taken Completed without difficulty  Pain immediately resolved suggesting accurate placement of the medication.  Advised to call if fevers/chills, erythema, induration, drainage, or persistent bleeding.  Images permanently stored and available for review in the ultrasound unit.  Impression: Technically successful ultrasound guided injection.     Impression and Recommendations:     This case required medical decision making of moderate complexity.

## 2014-11-24 NOTE — Patient Instructions (Signed)
Good to see you EXERCISES!!!!!  3 times a week.  pennsaid twice daily.  Ice at night before bed See me again in 3-4 weeks.

## 2014-12-18 ENCOUNTER — Other Ambulatory Visit: Payer: Self-pay | Admitting: Internal Medicine

## 2015-02-06 ENCOUNTER — Ambulatory Visit (INDEPENDENT_AMBULATORY_CARE_PROVIDER_SITE_OTHER): Payer: Federal, State, Local not specified - PPO | Admitting: Internal Medicine

## 2015-02-06 ENCOUNTER — Encounter: Payer: Self-pay | Admitting: Internal Medicine

## 2015-02-06 VITALS — BP 102/90 | HR 72 | Temp 97.8°F | Resp 14 | Ht 67.0 in | Wt 227.5 lb

## 2015-02-06 DIAGNOSIS — J209 Acute bronchitis, unspecified: Secondary | ICD-10-CM | POA: Diagnosis not present

## 2015-02-06 DIAGNOSIS — J310 Chronic rhinitis: Secondary | ICD-10-CM

## 2015-02-06 MED ORDER — AZITHROMYCIN 250 MG PO TABS: ORAL_TABLET | ORAL | Status: DC

## 2015-02-06 MED ORDER — HYDROCODONE-HOMATROPINE 5-1.5 MG/5ML PO SYRP: 5.0000 mL | ORAL_SOLUTION | Freq: Four times a day (QID) | ORAL | Status: DC | PRN

## 2015-02-06 NOTE — Progress Notes (Signed)
   Subjective:    Patient ID: [name], female    DOB: Dec 11, 1950, 63 y.o.   MRN: [id number]  HPI  Symptoms began 01/16/15 as headache and diffuse body aches. She had taken the flu shot in September  In the last several nights she has had fever and sweats. She has clear rhinitis and postnasal drainage. Cough is productive of clear to white, thick secretions. She's had some discomfort in the right ear as of today.  She denies any other upper respiratory tract infection or extrinsic symptoms.   Review of Systems Frontal headache, facial pain , nasal purulence, dental pain, sore throat , otic pain or otic discharge denied. No chills . Extrinsic symptoms of itchy, watery eyes, sneezing, or angioedema are denied. There is no wheezing or  paroxysmal nocturnal dyspnea.      Objective:   Physical Exam  General appearance:Adequately nourished; no acute distress or increased work of breathing is present.   BMI:  Lymphatic: No  lymphadenopathy about the head, neck, or axilla .  Eyes: No conjunctival inflammation or lid edema is present. There is no scleral icterus.  Ears:  External ear exam shows no significant lesions or deformities.  Otoscopic examination reveals clear canals, tympanic membranes are intact bilaterally without bulging, retraction, inflammation or discharge.  Nose:  External nasal examination shows no deformity or inflammation. Nasal mucosa erythematous L>R without lesions or exudates No septal dislocation or deviation.No obstruction to airflow.   Oral exam: Dental hygiene is good; lips and gums are healthy appearing.There is no oropharyngeal erythema or exudate .  Neck:  No deformities, thyromegaly, masses, or tenderness noted.   Supple with full range of motion without pain.   Heart:  Normal rate and regular rhythm. S1 and S2 normal without gallop, murmur, click, rub or other extra sounds.   Lungs:Chest clear to auscultation; no wheezes, rhonchi,rales ,or rubs  present.Dry cough  Extremities:  No cyanosis, edema, or clubbing  noted    Skin: Warm & dry w/o tenting or jaundice. No significant lesions or rash.       Assessment & Plan:  #1 acute bronchitis w/o bronchospasm #2 non allergic rhinitis Plan: See orders and recommendations

## 2015-02-06 NOTE — Patient Instructions (Signed)

## 2015-02-06 NOTE — Progress Notes (Signed)
Pre visit review using our clinic review tool, if applicable. No additional management support is needed unless otherwise documented below in the visit note. 

## 2015-03-24 ENCOUNTER — Telehealth: Payer: Self-pay | Admitting: Obstetrics & Gynecology

## 2015-03-24 ENCOUNTER — Ambulatory Visit: Payer: Federal, State, Local not specified - PPO | Admitting: Obstetrics & Gynecology

## 2015-03-24 NOTE — Telephone Encounter (Signed)
Please don't apply [facility] fee.

## 2015-03-24 NOTE — Telephone Encounter (Signed)
Pt had appointment at 1:30 on 03/24/15. Pt called at 1:45pm stating she was held up at work and was unable to make her appointment. Pt rescheduled with Debbi on 06/17/15.

## 2015-04-16 ENCOUNTER — Telehealth: Payer: Self-pay | Admitting: Geriatric Medicine

## 2015-04-16 ENCOUNTER — Ambulatory Visit (INDEPENDENT_AMBULATORY_CARE_PROVIDER_SITE_OTHER): Payer: Federal, State, Local not specified - PPO | Admitting: Internal Medicine

## 2015-04-16 ENCOUNTER — Ambulatory Visit: Payer: Federal, State, Local not specified - PPO | Admitting: Internal Medicine

## 2015-04-16 ENCOUNTER — Encounter: Payer: Self-pay | Admitting: Internal Medicine

## 2015-04-16 VITALS — BP 104/58 | HR 77 | Temp 98.4°F | Resp 18 | Ht 67.0 in | Wt 230.8 lb

## 2015-04-16 DIAGNOSIS — J069 Acute upper respiratory infection, unspecified: Secondary | ICD-10-CM | POA: Diagnosis not present

## 2015-04-16 MED ORDER — HYDROCODONE-ACETAMINOPHEN 5-325 MG PO TABS: 1.0000 | ORAL_TABLET | Freq: Four times a day (QID) | ORAL | Status: DC | PRN

## 2015-04-16 MED ORDER — BENZONATATE 100 MG PO CAPS: 100.0000 mg | ORAL_CAPSULE | Freq: Three times a day (TID) | ORAL | Status: DC | PRN

## 2015-04-16 NOTE — Patient Instructions (Signed)
We have sent in [place] which should help you to cough less. You can take 1 up to 3 times per day if you need them.   This is likely a virus which will not be helped by antibiotics. The typical virus is at its worst around Day 4-5 and lasts on average 10-12 days. The cough can last around 3 weeks on average and stays longer because the lungs get irritated from the coughing.   Taking something like zyrtec or flonase over the counter is likely to be the most helpful for stopping the drainage. Other over the counter cold medications can help with symptoms if you need them.   Warm liquids with honey or lemon can help with the sore throat.   Upper Respiratory Infection, Adult An upper respiratory infection (URI) is also sometimes known as the common cold. The upper respiratory tract includes the nose, sinuses, throat, trachea, and bronchi. Bronchi are the airways leading to the lungs. Most people improve within 1 week, but symptoms can last up to 2 weeks. A residual cough may last even longer.  CAUSES Many different viruses can infect the tissues lining the upper respiratory tract. The tissues become irritated and inflamed and often become very moist. Mucus production is also common. A cold is contagious. You can easily spread the virus to others by oral contact. This includes kissing, sharing a glass, coughing, or sneezing. Touching your mouth or nose and then touching a surface, which is then touched by another person, can also spread the virus. SYMPTOMS  Symptoms typically develop 1 to 3 days after you come in contact with a cold virus. Symptoms vary from person to person. They may include:  Runny nose.  Sneezing.  Nasal congestion.  Sinus irritation.  Sore throat.  Loss of voice (laryngitis).  Cough.  Fatigue.  Muscle aches.  Loss of appetite.  Headache.  Low-grade fever. DIAGNOSIS  You might diagnose your own cold based on familiar symptoms, since most people get a cold  2 to 3 times a year. Your caregiver can confirm this based on your exam. Most importantly, your caregiver can check that your symptoms are not due to another disease such as strep throat, sinusitis, pneumonia, asthma, or epiglottitis. Blood tests, throat tests, and X-rays are not necessary to diagnose a common cold, but they may sometimes be helpful in excluding other more serious diseases. Your caregiver will decide if any further tests are required. RISKS AND COMPLICATIONS  You may be at risk for a more severe case of the common cold if you smoke cigarettes, have chronic heart disease (such as heart failure) or lung disease (such as asthma), or if you have a weakened immune system. The very young and very old are also at risk for more serious infections. Bacterial sinusitis, middle ear infections, and bacterial pneumonia can complicate the common cold. The common cold can worsen asthma and chronic obstructive pulmonary disease (COPD). Sometimes, these complications can require emergency medical care and may be life-threatening. PREVENTION  The best way to protect against getting a cold is to practice good hygiene. Avoid oral or hand contact with people with cold symptoms. Wash your hands often if contact occurs. There is no clear evidence that vitamin C, vitamin E, echinacea, or exercise reduces the chance of developing a cold. However, it is always recommended to get plenty of rest and practice good nutrition. TREATMENT  Treatment is directed at relieving symptoms. There is no cure. Antibiotics are not effective, because the infection  is caused by a virus, not by bacteria. Treatment may include:  Increased fluid intake. Sports drinks offer valuable electrolytes, sugars, and fluids.  Breathing heated mist or steam (vaporizer or shower).  Eating chicken soup or other clear broths, and maintaining good nutrition.  Getting plenty of rest.  Using gargles or lozenges for comfort.  Controlling fevers  with ibuprofen or acetaminophen as directed by your caregiver.  Increasing usage of your inhaler if you have asthma. Zinc gel and zinc lozenges, taken in the first 24 hours of the common cold, can shorten the duration and lessen the severity of symptoms. Pain medicines may help with fever, muscle aches, and throat pain. A variety of non-prescription medicines are available to treat congestion and runny nose. Your caregiver can make recommendations and may suggest nasal or lung inhalers for other symptoms.  HOME CARE INSTRUCTIONS   Only take over-the-counter or prescription medicines for pain, discomfort, or fever as directed by your caregiver.  Use a warm mist humidifier or inhale steam from a shower to increase air moisture. This may keep secretions moist and make it easier to breathe.  Drink enough water and fluids to keep your urine clear or pale yellow.  Rest as needed.  Return to work when your temperature has returned to normal or as your caregiver advises. You may need to stay home longer to avoid infecting others. You can also use a face mask and careful hand washing to prevent spread of the virus. SEEK MEDICAL CARE IF:   After the first few days, you feel you are getting worse rather than better.  You need your caregiver's advice about medicines to control symptoms.  You develop chills, worsening shortness of breath, or brown or red sputum. These may be signs of pneumonia.  You develop yellow or brown nasal discharge or pain in the face, especially when you bend forward. These may be signs of sinusitis.  You develop a fever, swollen neck glands, pain with swallowing, or white areas in the back of your throat. These may be signs of strep throat. SEEK IMMEDIATE MEDICAL CARE IF:   You have a fever.  You develop severe or persistent headache, ear pain, sinus pain, or chest pain.  You develop wheezing, a prolonged cough, cough up blood, or have a change in your usual mucus (if you  have chronic lung disease).  You develop sore muscles or a stiff neck. Document Released: 04/19/2001 Document Revised: 01/16/2012 Document Reviewed: 01/29/2014 [facility] Patient Information 2015 [place], [place]. This information is not intended to replace advice given to you by your health care provider. Make sure you discuss any questions you have with your health care provider.

## 2015-04-16 NOTE — Telephone Encounter (Signed)
Done hardcopy to Dahlia  

## 2015-04-16 NOTE — Telephone Encounter (Signed)
Would like a refill on her hydrocodone. Please advise, thanks.

## 2015-04-16 NOTE — Telephone Encounter (Signed)
Rx faxed to pharmacy  

## 2015-04-17 DIAGNOSIS — J069 Acute upper respiratory infection, unspecified: Secondary | ICD-10-CM | POA: Insufficient documentation

## 2015-04-17 NOTE — Assessment & Plan Note (Signed)
Symptoms going on for 4 days and no indication for antibiotics at this time. Talked to her about expected course of 10-14 days symptoms and possibly 3 weeks of cough. If no improvement by Tues/Weds next week can consider antibiotic course. Talked to her about symptomatic management.

## 2015-04-17 NOTE — Progress Notes (Signed)
   Subjective:    Patient ID: [name], female    DOB: 06-Sep-1951, 64 y.o.   MRN: [id number]  HPI The patient is a 64 YO female who is coming in with cough and ear pain. She has had it since Saturday. Overall it is stable and not worsening. No fevers or chills. She has tried OTC cold medicine without much relief. Is not taking any medicine for allergies right now. No one else is sick around her. Some nasal congestion and dripping as well. No decreased hearing. Some mild facial pain.   Review of Systems  Constitutional: Negative for chills, activity change and appetite change.  HENT: Positive for congestion, ear pain, postnasal drip, rhinorrhea and sore throat. Negative for ear discharge, sinus pressure and trouble swallowing.   Eyes: Negative.   Respiratory: Positive for cough. Negative for chest tightness, shortness of breath and wheezing.   Cardiovascular: Negative.   Gastrointestinal: Negative.       Objective:   Physical Exam  Constitutional: She appears well-developed and well-nourished.  HENT:  Head: Normocephalic and atraumatic.  TMs normal bilaterally, oropharynx with erythema, nasal turbinates red and swollen with clear crusting.   Eyes: EOM are normal.  Neck: Neck supple.  Cardiovascular: Normal rate and regular rhythm.   Pulmonary/Chest: Effort normal and breath sounds normal. No respiratory distress. She has no wheezes.  Abdominal: Soft.  Lymphadenopathy:    She has no cervical adenopathy.   Filed Vitals:   04/16/15 1606  BP: 104/58  Pulse: 77  Temp: 98.4 F (36.9 C)  TempSrc: Oral  Resp: 18  Height: 5\' 7"  (1.702 m)  Weight: 230 lb 12.8 oz (104.69 kg)  SpO2: 93%      Assessment & Plan:

## 2015-06-17 ENCOUNTER — Encounter: Payer: Self-pay | Admitting: Certified Nurse Midwife

## 2015-06-17 ENCOUNTER — Ambulatory Visit (INDEPENDENT_AMBULATORY_CARE_PROVIDER_SITE_OTHER): Payer: Federal, State, Local not specified - PPO | Admitting: Certified Nurse Midwife

## 2015-06-17 VITALS — BP 106/70 | HR 70 | Resp 16 | Ht 66.75 in | Wt 228.0 lb

## 2015-06-17 DIAGNOSIS — Z124 Encounter for screening for malignant neoplasm of cervix: Secondary | ICD-10-CM

## 2015-06-17 DIAGNOSIS — N952 Postmenopausal atrophic vaginitis: Secondary | ICD-10-CM | POA: Diagnosis not present

## 2015-06-17 DIAGNOSIS — Z0000 Encounter for general adult medical examination without abnormal findings: Secondary | ICD-10-CM

## 2015-06-17 DIAGNOSIS — Z01419 Encounter for gynecological examination (general) (routine) without abnormal findings: Secondary | ICD-10-CM

## 2015-06-17 DIAGNOSIS — N3281 Overactive bladder: Secondary | ICD-10-CM

## 2015-06-17 DIAGNOSIS — R87629 Unspecified abnormal cytological findings in specimens from vagina: Secondary | ICD-10-CM

## 2015-06-17 HISTORY — DX: Unspecified abnormal cytological findings in specimens from vagina: R87.629

## 2015-06-17 LAB — POCT URINALYSIS DIPSTICK
BILIRUBIN UA: NEGATIVE
Blood, UA: NEGATIVE
Glucose, UA: NEGATIVE
KETONES UA: NEGATIVE
Leukocytes, UA: NEGATIVE
Nitrite, UA: NEGATIVE
Protein, UA: NEGATIVE
UROBILINOGEN UA: NEGATIVE
pH, UA: 5

## 2015-06-17 MED ORDER — ESTRADIOL 0.1 MG/GM VA CREA: TOPICAL_CREAM | VAGINAL | Status: DC

## 2015-06-17 MED ORDER — FESOTERODINE FUMARATE ER 8 MG PO TB24: 8.0000 mg | ORAL_TABLET | Freq: Every day | ORAL | Status: DC

## 2015-06-17 NOTE — Progress Notes (Signed)
Reviewed personally.  M. Suzanne Zion Ta, MD.  

## 2015-06-17 NOTE — Progress Notes (Signed)
65 y.o. G53P1001 Married  Caucasian Fe here for annual exam. Menopausal no ERT now. Weaned off Premarin cream.. Denies vaginal bleeding or vaginal dryness. Using Premarin cream with good results, but still slight dryness with sexual activity.Lisbeth Ply working well for OAB and desires continuance. Sees PCP yearly Dr. Jenny Reichmann for labs/aex all normal. Continues to have problems with right leg numbness, has seen orthopedic, but change to a specialist. Still having issues with walking. No other health issues today. Expecting first grandson soon!  No LMP recorded. Patient has had a hysterectomy.          Sexually active: Yes.    The current method of family planning is status post hysterectomy.    Exercising: No.  exercise Smoker:  no  Health Maintenance: Pap: 03-20-14 neg MMG:  05-06-15 category b density,birads 2:neg Colonoscopy:  2011/fu 59yr, Dr. SFuller PlanBMD:   2009 TDaP:  2015 Labs: poct urine-neg Self breast exam: done occ   reports that she has never smoked. She has never used smokeless tobacco. She reports that she drinks alcohol. She reports that she does not use illicit drugs.  Past Medical History  Diagnosis Date  . GERD (gastroesophageal reflux disease)   . Arthritis   . Diverticulosis 02/11/2012  . OVERACTIVE BLADDER 08/10/2007    Qualifier: Diagnosis of  By: WSherlynn Stalls CMA, [place]   . GERD 08/10/2007    Qualifier: Diagnosis of  By: WSherlynn Stalls CMA, [place]   . ARTHRITIS, KNEE 02/10/2009    Qualifier: Diagnosis of  By: SNiel HummerMD, WBaldemar Lenis ATROPHIC 11/11/2008    Qualifier: Diagnosis of  By: SNiel HummerMD, WLorinda CreedHEMORRHOIDS-INTERNAL 07/21/2010    Qualifier: Diagnosis of  By: GChester HolsteinNP, PNevin Bloodgood   . Cervical cancer   . Allergic rhinitis, cause unspecified   . Chronic LBP   . Obesity   . Degenerative arthritis of hip 02/11/2012  . Cervical disc disease 02/11/2012  . Hyperlipidemia 02/13/2012  . Recurrent UTI   . Hiatal hernia   . GERD (gastroesophageal reflux disease)   .  SUI (stress urinary incontinence, female)   . OAB (overactive bladder)   . BRCA negative 03/25/13  . Mitral valve prolapse   . Pneumonia   . PONV (postoperative nausea and vomiting)     Past Surgical History  Procedure Laterality Date  . Cervical discectomy  2003  . Bunionectomy  2007  . Shoulder arthroscopy  2004    frozen shoulder  . Tonsillectomy    . Cystocele repair  2003    rectocele repair  . Vaginal hysterectomy  1986  . Conization of cervix  1982    with D&C  . Eye surgery Bilateral     lasik  . Colonoscopy    . Cholecystectomy N/A 08/13/2013    Procedure: LAPAROSCOPIC CHOLECYSTECTOMY WITH INTRAOPERATIVE CHOLANGIOGRAM;  Surgeon: DShann Medal MD;  [facility]  Service: General;  Laterality: N/A;    Current Outpatient Prescriptions  Medication Sig Dispense Refill  . aspirin 81 MG tablet Take 81 mg by mouth 3 (three) times a week. Monday Wednesday Friday    . estradiol (ESTRACE) 0.1 MG/GM vaginal cream 1 gram pv 1-2 times weekly 42.5 g 2  . estrogens, conjugated, (PREMARIN) 0.625 MG tablet Take 1 tablet (0.625 mg total) by mouth daily. 30 tablet 13  . fesoterodine (TOVIAZ) 8 MG TB24 tablet Take 1 tablet (8 mg total) by mouth daily. 30 tablet 13  . HYDROcodone-acetaminophen (  NORCO/VICODIN) 5-325 MG per tablet Take 1 tablet by mouth every 6 (six) hours as needed for moderate pain. 30 tablet 0  . MULTIPLE VITAMIN tablet Take 1 tablet by mouth daily.    . nabumetone (RELAFEN) 750 MG tablet Take one tablet by mouth twice daily as needed  for arthritis 60 tablet 5  . nitrofurantoin, macrocrystal-monohydrate, (MACROBID) 100 MG capsule Take one capsule by mouth one time daily 30 capsule 11  . pantoprazole (PROTONIX) 40 MG tablet TAKE ONE TABLET BY MOUTH ONE TIME DAILY  90 tablet 3   No current facility-administered medications for this visit.    Family History  Problem Relation Age of Onset  . Breast cancer Mother   . Heart disease Sister   . Breast cancer Sister    . Diabetes Brother   . Heart disease Brother     triple bypass  . Diabetes Maternal Aunt   . Diabetes Maternal Uncle   . Cancer Maternal Grandmother     uterine    ROS:  Pertinent items are noted in HPI.  Otherwise, a comprehensive ROS was negative.  Exam:   BP 106/70 mmHg  Pulse 70  Resp 16  Ht 5' 6.75" (1.695 m)  Wt 228 lb (103.42 kg)  BMI 36.00 kg/m2 Height: 5' 6.75" (169.5 cm) Ht Readings from Last 3 Encounters:  06/17/15 5' 6.75" (1.695 m)  04/16/15 '5\' 7"'  (1.702 m)  02/06/15 '5\' 7"'  (1.702 m)    General appearance: alert, cooperative and appears stated age Head: Normocephalic, without obvious abnormality, atraumatic Neck: no adenopathy, supple, symmetrical, trachea midline and thyroid normal to inspection and palpation Lungs: clear to auscultation bilaterally Breasts: normal appearance, no masses or tenderness, No nipple retraction or dimpling, No nipple discharge or bleeding, No axillary or supraclavicular adenopathy, large pendulous Heart: regular rate and rhythm Abdomen: soft, non-tender; no masses,  no organomegaly Extremities: extremities normal, atraumatic, no cyanosis or edema Skin: Skin color, texture, turgor normal. No rashes or lesions Lymph nodes: Cervical, supraclavicular, and axillary nodes normal. No abnormal inguinal nodes palpated Neurologic: Grossly normal   Pelvic: External genitalia:  no lesions              Urethra:  normal appearing urethra with no masses, tenderness or lesions              Bartholin's and Skene's: normal                 Vagina: normal appearing vagina with normal color and discharge, no lesions              Cervix: absent              Pap taken: Yes.   Bimanual Exam:  Uterus:  uterus absent              Adnexa: normal adnexa and no mass, fullness, tenderness               Rectovaginal: Confirms               Anus:  normal sphincter tone, no lesions  Chaperone present: Yes  A:  Well Woman with normal exam  Menopausal no  ERT stopped this year S/P TVH cervical dysplasia retained ovaries  OAB Toviaz working well, desires continuance  Atrophic vaginitis Estrace working well, but some dryness  BMD due  Right leg numbness under PCP and orthopedic management  P:   Reviewed health and wellness pertinent to exam  Reviewed warning signs with use  and need to advise.  Rx Lisbeth Ply see order  Discussed risk and benefits of use and can also use coconut oil in between Estrace and for sexual activity. Will try and advise if issues with.  Rx Estrace see order  Discussed need for BMD evaluation, patient to schedule her appointment.  Continue follow up as indicated.  Pap smear as above with HPV reflex   counseled on breast self exam, mammography screening, osteoporosis, adequate intake of calcium and vitamin D, diet and exercise  return annually or prn  An After Visit Summary was printed and given to the patient.

## 2015-06-17 NOTE — Patient Instructions (Signed)

## 2015-06-18 NOTE — Addendum Note (Signed)
Addended by: Regina Eck on: 06/18/2015 11:59 AM   Modules accepted: Miquel Dunn

## 2015-06-19 ENCOUNTER — Other Ambulatory Visit: Payer: Self-pay | Admitting: Certified Nurse Midwife

## 2015-06-19 DIAGNOSIS — R87629 Unspecified abnormal cytological findings in specimens from vagina: Secondary | ICD-10-CM

## 2015-06-19 LAB — IPS PAP TEST WITH REFLEX TO HPV

## 2015-06-22 ENCOUNTER — Telehealth: Payer: Self-pay

## 2015-06-22 ENCOUNTER — Telehealth: Payer: Self-pay | Admitting: Certified Nurse Midwife

## 2015-06-22 NOTE — Telephone Encounter (Signed)
-----   Message from Regina Eck, CNM sent at 06/19/2015  5:06 PM EDT ----- Notify patient that her pap smear showed LSIL and will need colposcopy for evaluation. Order in . Please schedule

## 2015-06-22 NOTE — Telephone Encounter (Signed)
Called patient to review benefits for colposcopy. Patient understands and agreeable. Verified appointment date/time. Patient understands and agreeable. Ok to close

## 2015-06-22 NOTE — Telephone Encounter (Signed)
Spoke with patient. Advised of results as seen below from [place]. Patient is agreeable and verbalizes understanding. Appointment for colposcopy scheduled for 06/24/2015 at 2pm with Regina Eck CNM. Patient is agreeable to date and time.  Instructions given. Motrin 800 mg po x , one hour before appointment with food. Make sure to eat a meal before appointment and drink plenty of fluids. Order will need precert.  Cc: Loreen Freud  Routing to provider for final review. Patient agreeable to disposition. Will close encounter.   Patient aware provider will review message and nurse will return call if any additional advice or change of disposition.

## 2015-06-24 ENCOUNTER — Encounter: Payer: Self-pay | Admitting: Certified Nurse Midwife

## 2015-06-24 ENCOUNTER — Ambulatory Visit (INDEPENDENT_AMBULATORY_CARE_PROVIDER_SITE_OTHER): Payer: Federal, State, Local not specified - PPO | Admitting: Certified Nurse Midwife

## 2015-06-24 DIAGNOSIS — R87629 Unspecified abnormal cytological findings in specimens from vagina: Secondary | ICD-10-CM

## 2015-06-24 DIAGNOSIS — R896 Abnormal cytological findings in specimens from other organs, systems and tissues: Secondary | ICD-10-CM | POA: Diagnosis not present

## 2015-06-24 NOTE — Progress Notes (Signed)
Patient ID: [name], female   DOB: 1951/07/31, 64 y.o.   MRN: [id number]  Chief Complaint  Patient presents with  . Colposcopy    jj    HPI [name] is a 64 y.o. white menopausal female g1 p1001 here for colposcopy exam. Denies pelvic pain or bleeding. Patient uses Estrace cream for vaginal dryness. HPI  Indications: Pap smear on August  10,2016 showed: low-grade squamous intraepithelial neoplasia (LGSIL - encompassing HPV,mild dysplasia,CIN I). Prior cervical treatment: conization for CA per patient, hysterectomy for bleeding ? cancer. Not noted per patient today.  Past Medical History  Diagnosis Date  . GERD (gastroesophageal reflux disease)   . Arthritis   . Diverticulosis 02/11/2012  . OVERACTIVE BLADDER 08/10/2007    Qualifier: Diagnosis of  By: Sherlynn Stalls, CMA, [place]    . GERD 08/10/2007    Qualifier: Diagnosis of  By: Sherlynn Stalls, CMA, [place]    . ARTHRITIS, KNEE 02/10/2009    Qualifier: Diagnosis of  By: Niel Hummer MD, Baldemar Lenis, ATROPHIC 11/11/2008    Qualifier: Diagnosis of  By: Niel Hummer MD, Lorinda Creed HEMORRHOIDS-INTERNAL 07/21/2010    Qualifier: Diagnosis of  By: Chester Holstein NP, Nevin Bloodgood    . Cervical cancer   . Allergic rhinitis, cause unspecified   . Chronic LBP   . Obesity   . Degenerative arthritis of hip 02/11/2012  . Cervical disc disease 02/11/2012  . Hyperlipidemia 02/13/2012  . Recurrent UTI   . Hiatal hernia   . GERD (gastroesophageal reflux disease)   . SUI (stress urinary incontinence, female)   . OAB (overactive bladder)   . BRCA negative 03/25/13  . Mitral valve prolapse   . Pneumonia   . PONV (postoperative nausea and vomiting)   . Abnormal Pap smear of vagina 06/17/15    LGSIL    Past Surgical History  Procedure Laterality Date  . Cervical discectomy  2003  . Bunionectomy  2007  . Shoulder arthroscopy  2004    frozen shoulder  . Tonsillectomy    . Cystocele repair  2003    rectocele repair  . Vaginal hysterectomy  1986  . Conization of  cervix  1982    with D&C  . Eye surgery Bilateral     lasik  . Colonoscopy    . Cholecystectomy N/A 08/13/2013    Procedure: LAPAROSCOPIC CHOLECYSTECTOMY WITH INTRAOPERATIVE CHOLANGIOGRAM;  Surgeon: Shann Medal, MD;  Location: [facility];  Service: General;  Laterality: N/A;    Family History  Problem Relation Age of Onset  . Breast cancer Mother   . Heart disease Sister   . Breast cancer Sister   . Diabetes Brother   . Heart disease Brother     triple bypass  . Diabetes Maternal Aunt   . Diabetes Maternal Uncle   . Cancer Maternal Grandmother     uterine    Social History Social History  Substance Use Topics  . Smoking status: Never Smoker   . Smokeless tobacco: Never Used  . Alcohol Use: 0.0 oz/week    0 Standard drinks or equivalent per week     Comment: 1 q month or 2    Allergies  Allergen Reactions  . Amoxicillin     REACTION: unspecified  . Penicillins Other (See Comments)    Unknown reaction    Current Outpatient Prescriptions  Medication Sig Dispense Refill  . aspirin 81 MG tablet Take 81 mg by mouth 3 (three) times a week. Monday  Wednesday Friday    . estradiol (ESTRACE) 0.1 MG/GM vaginal cream 1 gram pv 1-2 times weekly 42.5 g 3  . fesoterodine (TOVIAZ) 8 MG TB24 tablet Take 1 tablet (8 mg total) by mouth daily. 30 tablet 13  . HYDROcodone-acetaminophen (NORCO/VICODIN) 5-325 MG per tablet Take 1 tablet by mouth every 6 (six) hours as needed for moderate pain. 30 tablet 0  . MULTIPLE VITAMIN tablet Take 1 tablet by mouth daily.    . nabumetone (RELAFEN) 750 MG tablet Take one tablet by mouth twice daily as needed  for arthritis 60 tablet 5  . nitrofurantoin, macrocrystal-monohydrate, (MACROBID) 100 MG capsule Take one capsule by mouth one time daily 30 capsule 11  . pantoprazole (PROTONIX) 40 MG tablet TAKE ONE TABLET BY MOUTH ONE TIME DAILY  90 tablet 3   No current facility-administered medications for this visit.    Review of Systems Review of  Systems  Blood pressure 118/72, pulse 72, resp. rate 16, height 5' 6.75" (1.695 m), weight 229 lb (103.874 kg).  Physical Exam Physical Exam  Data Reviewed Reviewed pap smear results and questions addressed.  Assessment    Procedure Details  The risks and benefits of the procedure and Written informed consent obtained.  Speculum placed in vagina and excellent visualization of vagina achieved, vagina swabbed x 3 with saline solution and  acetic acid solution. No acetowhite areas noted or unusual tissue appearance. Lugol's applied with staining taken throughout vagina. Vagina inspected closely, with vaginal cuff inspected carefully with 3.75, 7.5,15 # and green filter. No abnormalities noted. No biopsy taken. Speculum removed. Patient tolerated procedure well.  Specimens: none  Complications: none.     Plan   Discussed no findings or abnormality with colposcope. Plan to repeat pap 6 months.      Hurley Blevins 06/24/2015, 2:12 PM

## 2015-06-24 NOTE — Patient Instructions (Signed)

## 2015-06-25 NOTE — Progress Notes (Signed)
Reviewed personally.  M. Suzanne Chace Klippel, MD.  

## 2015-07-08 ENCOUNTER — Encounter: Payer: Self-pay | Admitting: Gastroenterology

## 2015-07-14 ENCOUNTER — Other Ambulatory Visit (INDEPENDENT_AMBULATORY_CARE_PROVIDER_SITE_OTHER): Payer: Federal, State, Local not specified - PPO

## 2015-07-14 DIAGNOSIS — Z0000 Encounter for general adult medical examination without abnormal findings: Secondary | ICD-10-CM | POA: Diagnosis not present

## 2015-07-14 LAB — BASIC METABOLIC PANEL
BUN: 16 mg/dL (ref 6–23)
CALCIUM: 9.2 mg/dL (ref 8.4–10.5)
CO2: 27 meq/L (ref 19–32)
CREATININE: 0.8 mg/dL (ref 0.40–1.20)
Chloride: 108 mEq/L (ref 96–112)
GFR: 77.31 mL/min (ref >60.00)
Glucose, Bld: 94 mg/dL (ref 70–99)
Potassium: 3.8 mEq/L (ref 3.5–5.1)
SODIUM: 143 meq/L (ref 135–145)

## 2015-07-14 LAB — CBC WITH DIFFERENTIAL/PLATELET
BASOS PCT: 0.5 % (ref 0.0–3.0)
Basophils Absolute: 0 10*3/uL (ref 0.0–0.1)
EOS PCT: 3.6 % (ref 0.0–5.0)
Eosinophils Absolute: 0.3 10*3/uL (ref 0.0–0.7)
HCT: 40.9 % (ref 36.0–46.0)
Hemoglobin: 13.8 g/dL (ref 12.0–15.0)
LYMPHS ABS: 3.5 10*3/uL (ref 0.7–4.0)
Lymphocytes Relative: 43.6 % (ref 12.0–46.0)
MCHC: 33.6 g/dL (ref 30.0–36.0)
MCV: 87.8 fl (ref 78.0–100.0)
MONO ABS: 0.4 10*3/uL (ref 0.1–1.0)
MONOS PCT: 5.3 % (ref 3.0–12.0)
NEUTROS PCT: 47 % (ref 43.0–77.0)
Neutro Abs: 3.8 10*3/uL (ref 1.4–7.7)
Platelets: 232 10*3/uL (ref 150.0–400.0)
RBC: 4.66 Mil/uL (ref 3.87–5.11)
RDW: 13.4 % (ref 11.5–15.5)
WBC: 8.1 10*3/uL (ref 4.0–10.5)

## 2015-07-14 LAB — LIPID PANEL
Cholesterol: 166 mg/dL (ref 0–200)
HDL: 41.1 mg/dL (ref >39.00)
LDL CALC: 101 mg/dL — AB (ref 0–99)
NonHDL: 124.83
Total CHOL/HDL Ratio: 4
Triglycerides: 119 mg/dL (ref 0.0–149.0)
VLDL: 23.8 mg/dL (ref 0.0–40.0)

## 2015-07-14 LAB — TSH: TSH: 4.73 u[IU]/mL — AB (ref 0.35–4.50)

## 2015-07-14 LAB — URINALYSIS, ROUTINE W REFLEX MICROSCOPIC
Bilirubin Urine: NEGATIVE
Hgb urine dipstick: NEGATIVE
Ketones, ur: NEGATIVE
Leukocytes, UA: NEGATIVE
Nitrite: NEGATIVE
RBC / HPF: NONE SEEN (ref 0–?)
SPECIFIC GRAVITY, URINE: 1.015 (ref 1.000–1.030)
Total Protein, Urine: NEGATIVE
URINE GLUCOSE: NEGATIVE
Urobilinogen, UA: 0.2 (ref 0.0–1.0)
WBC, UA: NONE SEEN (ref 0–?)
pH: 6 (ref 5.0–8.0)

## 2015-07-14 LAB — HEPATIC FUNCTION PANEL
ALK PHOS: 77 U/L (ref 39–117)
ALT: 14 U/L (ref 0–35)
AST: 14 U/L (ref 0–37)
Albumin: 3.9 g/dL (ref 3.5–5.2)
BILIRUBIN TOTAL: 0.7 mg/dL (ref 0.2–1.2)
Bilirubin, Direct: 0.1 mg/dL (ref 0.0–0.3)
Total Protein: 6.6 g/dL (ref 6.0–8.3)

## 2015-07-21 ENCOUNTER — Encounter: Payer: Self-pay | Admitting: Internal Medicine

## 2015-07-21 ENCOUNTER — Ambulatory Visit (INDEPENDENT_AMBULATORY_CARE_PROVIDER_SITE_OTHER): Payer: Federal, State, Local not specified - PPO | Admitting: Internal Medicine

## 2015-07-21 VITALS — BP 136/86 | HR 78 | Temp 97.8°F | Ht 67.0 in | Wt 229.0 lb

## 2015-07-21 DIAGNOSIS — Z23 Encounter for immunization: Secondary | ICD-10-CM

## 2015-07-21 DIAGNOSIS — Z0000 Encounter for general adult medical examination without abnormal findings: Secondary | ICD-10-CM | POA: Diagnosis not present

## 2015-07-21 DIAGNOSIS — R7302 Impaired glucose tolerance (oral): Secondary | ICD-10-CM

## 2015-07-21 DIAGNOSIS — R7989 Other specified abnormal findings of blood chemistry: Secondary | ICD-10-CM

## 2015-07-21 MED ORDER — NABUMETONE 750 MG PO TABS: ORAL_TABLET | ORAL | Status: DC

## 2015-07-21 MED ORDER — PANTOPRAZOLE SODIUM 40 MG PO TBEC: 40.0000 mg | DELAYED_RELEASE_TABLET | Freq: Every day | ORAL | Status: DC

## 2015-07-21 MED ORDER — HYDROCODONE-ACETAMINOPHEN 5-325 MG PO TABS: 1.0000 | ORAL_TABLET | Freq: Four times a day (QID) | ORAL | Status: DC | PRN

## 2015-07-21 NOTE — Assessment & Plan Note (Signed)
Asympt, for repeat tsh in 6 mo, also due for Hep c for routine screen so will do then as well

## 2015-07-21 NOTE — Patient Instructions (Addendum)
You had the flu shot today  Your EKG was OK today  Please continue all other medications as before, and refills have been done if requested.  Please have the pharmacy call with any other refills you may need.  Please continue your efforts at being more active, low cholesterol diet, and weight control.  You are otherwise up to date with prevention measures today.  Please keep your appointments with your specialists as you may have planned  Please go to the LAB ONLY in the Basement (turn left off the elevator) for the tests to be done in 6 months (the repeat Thyroid and Hep C tests)  You will be contacted by phone if any changes need to be made immediately.  Otherwise, you will receive a letter about your results with an explanation, but please check with MyChart first.  Please remember to sign up for MyChart if you have not done so, as this will be important to you in the future with finding out test results, communicating by private email, and scheduling acute appointments online when needed.  Please return in 1 year for your yearly visit, or sooner if needed, with Lab testing done 3-5 days before

## 2015-07-21 NOTE — Assessment & Plan Note (Signed)
Asympt, also for a1c in 6 mo, cont wt loss efforts and diet

## 2015-07-21 NOTE — Progress Notes (Signed)
Pre visit review using our clinic review tool, if applicable. No additional management support is needed unless otherwise documented below in the visit note. 

## 2015-07-21 NOTE — Assessment & Plan Note (Addendum)

## 2015-07-21 NOTE — Addendum Note (Signed)
Addended by: Biagio Borg on: 07/21/2015 09:12 AM   Modules accepted: Orders

## 2015-07-21 NOTE — Progress Notes (Signed)
Subjective:    Patient ID: [name], female    DOB: 25-Sep-1951, 64 y.o.   MRN: [id number]  HPI  Here for wellness and f/u;  Overall doing ok;  Pt denies Chest pain, worsening SOB, DOE, wheezing, orthopnea, PND, worsening LE edema, palpitations, dizziness or syncope.  Pt denies neurological change such as new headache, facial or extremity weakness.  Pt denies polydipsia, polyuria, or low sugar symptoms. Pt states overall good compliance with treatment and medications, good tolerability, and has been trying to follow appropriate diet.  Pt denies worsening depressive symptoms, suicidal ideation or panic. No fever, night sweats, wt loss, loss of appetite, or other constitutional symptoms.  Pt states good ability with ADL's, has low fall risk, home safety reviewed and adequate, no other significant changes in hearing or vision, and only occasionally active with exercise. No new complaints except for mild allergy nasal symptoms with post nasal gtt and occas scratchy throat Wt Readings from Last 3 Encounters:  07/21/15 229 lb (103.874 kg)  06/24/15 229 lb (103.874 kg)  06/17/15 228 lb (103.42 kg)   Past Medical History  Diagnosis Date  . GERD (gastroesophageal reflux disease)   . Arthritis   . Diverticulosis 02/11/2012  . OVERACTIVE BLADDER 08/10/2007    Qualifier: Diagnosis of  By: Sherlynn Stalls, CMA, [place]    . GERD 08/10/2007    Qualifier: Diagnosis of  By: Sherlynn Stalls, CMA, [place]    . ARTHRITIS, KNEE 02/10/2009    Qualifier: Diagnosis of  By: Niel Hummer MD, Baldemar Lenis, ATROPHIC 11/11/2008    Qualifier: Diagnosis of  By: Niel Hummer MD, Lorinda Creed HEMORRHOIDS-INTERNAL 07/21/2010    Qualifier: Diagnosis of  By: Chester Holstein NP, Nevin Bloodgood    . Cervical cancer   . Allergic rhinitis, cause unspecified   . Chronic LBP   . Obesity   . Degenerative arthritis of hip 02/11/2012  . Cervical disc disease 02/11/2012  . Hyperlipidemia 02/13/2012  . Recurrent UTI   . Hiatal hernia   . GERD (gastroesophageal reflux  disease)   . SUI (stress urinary incontinence, female)   . OAB (overactive bladder)   . BRCA negative 03/25/13  . Mitral valve prolapse   . Pneumonia   . PONV (postoperative nausea and vomiting)   . Abnormal Pap smear of vagina 06/17/15    LGSIL   Past Surgical History  Procedure Laterality Date  . Cervical discectomy  2003  . Bunionectomy  2007  . Shoulder arthroscopy  2004    frozen shoulder  . Tonsillectomy    . Cystocele repair  2003    rectocele repair  . Vaginal hysterectomy  1986  . Conization of cervix  1982    with D&C  . Eye surgery Bilateral     lasik  . Colonoscopy    . Cholecystectomy N/A 08/13/2013    Procedure: LAPAROSCOPIC CHOLECYSTECTOMY WITH INTRAOPERATIVE CHOLANGIOGRAM;  Surgeon: Shann Medal, MD;  Location: [facility];  Service: General;  Laterality: N/A;    reports that she has never smoked. She has never used smokeless tobacco. She reports that she drinks alcohol. She reports that she does not use illicit drugs. family history includes Breast cancer in her mother and sister; Cancer in her maternal grandmother; Diabetes in her brother, maternal aunt, and maternal uncle; Heart disease in her brother and sister. Allergies  Allergen Reactions  . Amoxicillin     REACTION: unspecified  . Penicillins Other (See Comments)    Unknown reaction  Current Outpatient Prescriptions on File Prior to Visit  Medication Sig Dispense Refill  . aspirin 81 MG tablet Take 81 mg by mouth 3 (three) times a week. Monday Wednesday Friday    . estradiol (ESTRACE) 0.1 MG/GM vaginal cream 1 gram pv 1-2 times weekly 42.5 g 3  . fesoterodine (TOVIAZ) 8 MG TB24 tablet Take 1 tablet (8 mg total) by mouth daily. 30 tablet 13  . HYDROcodone-acetaminophen (NORCO/VICODIN) 5-325 MG per tablet Take 1 tablet by mouth every 6 (six) hours as needed for moderate pain. 30 tablet 0  . MULTIPLE VITAMIN tablet Take 1 tablet by mouth daily.    . nitrofurantoin, macrocrystal-monohydrate, (MACROBID) 100 MG  capsule Take one capsule by mouth one time daily 30 capsule 11   No current facility-administered medications on file prior to visit.   Review of Systems Constitutional: Negative for increased diaphoresis, other activity, appetite or siginficant weight change other than noted HENT: Negative for worsening hearing loss, ear pain, facial swelling, mouth sores and neck stiffness.   Eyes: Negative for other worsening pain, redness or visual disturbance.  Respiratory: Negative for shortness of breath and wheezing  Cardiovascular: Negative for chest pain and palpitations.  Gastrointestinal: Negative for diarrhea, blood in stool, abdominal distention or other pain Genitourinary: Negative for hematuria, flank pain or change in urine volume.  Musculoskeletal: Negative for myalgias or other joint complaints.  Skin: Negative for color change and wound or drainage.  Neurological: Negative for syncope and numbness. other than noted Hematological: Negative for adenopathy. or other swelling Psychiatric/Behavioral: Negative for hallucinations, SI, self-injury, decreased concentration or other worsening agitation.      Objective:   Physical Exam BP 136/86 mmHg  Pulse 78  Temp(Src) 97.8 F (36.6 C) (Oral)  Ht '5\' 7"'  (1.702 m)  Wt 229 lb (103.874 kg)  BMI 35.86 kg/m2  SpO2 96% VS noted,  Constitutional: Pt is oriented to person, place, and time. Appears well-developed and well-nourished, in no significant distress Head: Normocephalic and atraumatic.  Right Ear: External ear normal.  Left Ear: External ear normal.  Nose: Nose normal.  Mouth/Throat: Oropharynx is clear and moist.  Eyes: Conjunctivae and EOM are normal. Pupils are equal, round, and reactive to light.  Neck: Normal range of motion. Neck supple. No JVD present. No tracheal deviation present or significant neck LA or mass Cardiovascular: Normal rate, regular rhythm, normal heart sounds and intact distal pulses.   Pulmonary/Chest: Effort  normal and breath sounds without rales or wheezing  Abdominal: Soft. Bowel sounds are normal. NT. No HSM  Musculoskeletal: Normal range of motion. Exhibits no edema.  Lymphadenopathy:  Has no cervical adenopathy.  Neurological: Pt is alert and oriented to person, place, and time. Pt has normal reflexes. No cranial nerve deficit. Motor grossly intact Skin: Skin is warm and dry. No rash noted.  Psychiatric:  Has normal mood and affect. Behavior is normal.     Assessment & Plan:

## 2015-08-20 ENCOUNTER — Encounter: Payer: Self-pay | Admitting: Internal Medicine

## 2015-08-20 ENCOUNTER — Ambulatory Visit (INDEPENDENT_AMBULATORY_CARE_PROVIDER_SITE_OTHER): Payer: Federal, State, Local not specified - PPO | Admitting: Internal Medicine

## 2015-08-20 VITALS — BP 124/70 | HR 66 | Temp 98.0°F | Resp 18 | Wt 234.0 lb

## 2015-08-20 DIAGNOSIS — M5090 Cervical disc disorder, unspecified, unspecified cervical region: Secondary | ICD-10-CM

## 2015-08-20 DIAGNOSIS — G6289 Other specified polyneuropathies: Secondary | ICD-10-CM

## 2015-08-20 MED ORDER — GABAPENTIN 100 MG PO CAPS: ORAL_CAPSULE | ORAL | Status: DC

## 2015-08-20 NOTE — Progress Notes (Signed)
   Subjective:    Patient ID: [name], female    DOB: [birth year], 64 y.o.   MRN: [id number]  HPI Her symptoms have been present 2-3 months as tingling in the left forearm from the left lateral elbow to the hand if she has her left arm flexed & approximated to her trunk for any period of time. With supination of the left hand she will have radicular pain down the ulnar aspect of the forearm to the hand.  Occasionally she has a sharp pain in her neck with a "pop" with rotation of the head. She has no associated radicular pain from the neck to the shoulder and upper arm.  She has a history of ruptured disc. She had plate and screws inserted in the cervical spine in August 2002.   Review of Systems Fever, chills, sweats, or unexplained weight loss not present. No significant headaches. Mental status change or memory loss denied. Blurred vision , diplopia or vision loss absent. Vertigo, near syncope or imbalance denied. No loss of control of bladder or bowels. Radicular type pain absent. No seizure stigmata.    Objective:   Physical Exam  Pertinent or positive findings include: She has surprisingly good range of motion of the cervical spine. Range of motion of the upper extremity is also normal. With supination of the left hand she has some radicular symptoms along the ulnar aspect of the forearm. When she grasps my hand and I pronate the left forearm there is no radicular component.  General appearance :adequately nourished; in no distress.  Eyes: No conjunctival inflammation or scleral icterus is present.  Heart:  Normal rate and regular rhythm. S1 and S2 normal without gallop, murmur, click, rub or other extra sounds    Lungs:Chest clear to auscultation; no wheezes, rhonchi,rales ,or rubs present.No increased work of breathing.    Vascular : radial pulses equal ; no bruits present.  Skin:Warm & dry.  Intact without suspicious lesions or rashes ; no tenting or jaundice    Lymphatic: No lymphadenopathy is noted about the head, neck, axilla.   Neuro: Strength, tone & DTRs normal in UE.     Assessment & Plan:  #1 peripheral neuropathy; cervical radiculopathy is not suggested See orders; nerve conduction/EMG will be necessary to define etiology. If there is no evidence of cervical radiculopathy; Hand Surgery referral will be made.

## 2015-08-20 NOTE — Progress Notes (Signed)
Pre visit review using our clinic review tool, if applicable. No additional management support is needed unless otherwise documented below in the visit note. 

## 2015-08-20 NOTE — Patient Instructions (Signed)
The Neurology referral will be scheduled and you'll be notified of the time.Please call the Referral Co-Ordinator @ [phone] if you have not been notified of appointment time within 7-10 days. 

## 2015-08-25 ENCOUNTER — Other Ambulatory Visit: Payer: Self-pay | Admitting: *Deleted

## 2015-08-25 DIAGNOSIS — G629 Polyneuropathy, unspecified: Secondary | ICD-10-CM

## 2015-09-08 ENCOUNTER — Ambulatory Visit (INDEPENDENT_AMBULATORY_CARE_PROVIDER_SITE_OTHER): Payer: Federal, State, Local not specified - PPO | Admitting: Neurology

## 2015-09-08 DIAGNOSIS — G629 Polyneuropathy, unspecified: Secondary | ICD-10-CM

## 2015-09-08 DIAGNOSIS — M5412 Radiculopathy, cervical region: Secondary | ICD-10-CM

## 2015-09-08 NOTE — Procedures (Signed)
[facility]  [place], [place]  [place], [place] [address] Tel: [phone] Fax:  [phone] Test Date:  09/08/2015  Patient: [name] DOB: 1951-04-14 Physician: Narda Amber, DO  Sex: Female Height: 5\' 7"  Ref Phys: Unice Cobble, M.D.  ID#: [id number] Temp: 36.4C Technician: Jerilynn Mages. Dean   Patient Complaints: This is a 64 year old female presenting for evaluation of episodic left forearm tingling and localized elbow pain.  NCV & EMG Findings: Extensive electrodiagnostic testing of the left upper extremity shows: 1. Left median, ulnar, dorsal cutaneous ulnar, and palmar sensory responses are within normal limits. 2. Left median and ulnar motor responses are within normal limits. 3. Mild chronic motor axon loss changes are seen affecting the pronator teres and brachioradialis muscles, without accompanied active denervation.  Impression: 1. Chronic C6 radiculopathy affecting the left upper extremity, mild in degree electrically. 2. There is no evidence of a polyneuropathy, carpal tunnel syndrome, or ulnar neuropathy on the left.  ___________________________ Narda Amber, DO    Nerve Conduction Studies Anti Sensory Summary Table   Site NR Peak (ms) Norm Peak (ms) P-T Amp (V) Norm P-T Amp  Left DorsCutan Anti Sensory (Dorsum 5th MC)  36.4C  Wrist    1.5 <3.2 15.6 >5  Left Median Anti Sensory (2nd Digit)  36.4C  Wrist    3.6 <3.8 15.5 >10  Left Ulnar Anti Sensory (5th Digit)  36.4C  Wrist    3.1 <3.2 11.3 >5   Motor Summary Table   Site NR Onset (ms) Norm Onset (ms) O-P Amp (mV) Norm O-P Amp Site1 Site2 Delta-0 (ms) Dist (cm) Vel (m/s) Norm Vel (m/s)  Left Median Motor (Abd Poll Brev)  36.4C  Wrist    3.3 <4.0 7.5 >5 Elbow Wrist 5.3 27.0 51 >50  Elbow    8.6  7.4         Left Ulnar Motor (Abd Dig Minimi)  36.4C  Wrist    2.6 <3.1 8.6 >7 B Elbow Wrist 3.6 19.0 53 >50  B Elbow    6.2  7.8  A Elbow B Elbow 1.6 10.0 63 >50  A Elbow    7.8  7.4            Comparison Summary Table   Site NR Peak (ms) Norm Peak (ms) P-T Amp (V) Site1 Site2 Delta-P (ms) Norm Delta (ms)  Left Median/Ulnar Palm Comparison (Wrist - 8cm)  36.4C  Median Palm    2.1 <2.2 24.8 Median Palm Ulnar Palm 0.1   Ulnar Palm    2.0 <2.2 11.3       EMG   Side Muscle Ins Act Fibs Psw Fasc Number Recrt Dur Dur. Amp Amp. Poly Poly. Comment  Left 1stDorInt Nml Nml Nml Nml Nml Nml Nml Nml Nml Nml Nml Nml N/A  Left Ext Indicis Nml Nml Nml Nml Nml Nml Nml Nml Nml Nml Nml Nml N/A  Left PronatorTeres Nml Nml Nml Nml 1- Rapid Some 1+ Some 1+ Nml Nml N/A  Left Biceps Nml Nml Nml Nml Nml Nml Nml Nml Nml Nml Nml Nml N/A  Left Triceps Nml Nml Nml Nml Nml Nml Nml Nml Nml Nml Nml Nml N/A  Left Deltoid Nml Nml Nml Nml Nml Nml Nml Nml Nml Nml Nml Nml N/A  Left BrachioRad Nml Nml Nml Nml 1- Rapid Few 1+ Few 1+ Nml Nml N/A      Waveforms:

## 2015-09-16 ENCOUNTER — Other Ambulatory Visit: Payer: Self-pay

## 2015-09-16 MED ORDER — NITROFURANTOIN MONOHYD MACRO 100 MG PO CAPS: ORAL_CAPSULE | ORAL | Status: DC

## 2015-09-16 NOTE — Telephone Encounter (Signed)
macrobid rx done erx

## 2015-09-24 ENCOUNTER — Telehealth: Payer: Self-pay | Admitting: Internal Medicine

## 2015-09-24 NOTE — Telephone Encounter (Signed)
Pt called wanting results from a nerve test she had done on 11/1  Also, her prescription for nabumetone (RELAFEN) 750 MG tablet [id number] that was written in Sept never got to pharmacy. Can you please resend it to [facility] on [address]

## 2015-09-25 ENCOUNTER — Other Ambulatory Visit: Payer: Self-pay | Admitting: Internal Medicine

## 2015-09-25 DIAGNOSIS — M5090 Cervical disc disorder, unspecified, unspecified cervical region: Secondary | ICD-10-CM

## 2015-09-25 MED ORDER — NABUMETONE 750 MG PO TABS: ORAL_TABLET | ORAL | Status: DC

## 2015-09-25 NOTE — Telephone Encounter (Signed)
Pt declines to start Gabapentin at this time. She is requesting a referral to NS - Dr. Carloyn Manner

## 2015-09-25 NOTE — Telephone Encounter (Signed)
Pt is calling back again regarding the nerve test she had done on 11/1. Linus Orn is the one who referred her to get it done. She is wanting to know the results and it is hurting her pretty bad.  Can you please give her a call

## 2015-09-25 NOTE — Telephone Encounter (Signed)
Please advise 

## 2015-09-25 NOTE — Telephone Encounter (Signed)
there is localized involvement only of the sixth cervical nerve; gabapentin 100 mg up to 3 every 8 hours as needed would be recommended  to calm the nerve  If symptoms persist neurosurgical referral will be indicated.

## 2015-10-12 ENCOUNTER — Encounter: Payer: Self-pay | Admitting: Internal Medicine

## 2015-10-12 ENCOUNTER — Ambulatory Visit (INDEPENDENT_AMBULATORY_CARE_PROVIDER_SITE_OTHER): Payer: Federal, State, Local not specified - PPO | Admitting: Internal Medicine

## 2015-10-12 VITALS — BP 140/88 | HR 75 | Temp 98.2°F | Resp 20 | Ht 67.0 in | Wt 229.5 lb

## 2015-10-12 DIAGNOSIS — J209 Acute bronchitis, unspecified: Secondary | ICD-10-CM | POA: Insufficient documentation

## 2015-10-12 MED ORDER — HYDROCODONE-HOMATROPINE 5-1.5 MG/5ML PO SYRP: 5.0000 mL | ORAL_SOLUTION | Freq: Three times a day (TID) | ORAL | Status: DC | PRN

## 2015-10-12 MED ORDER — MOXIFLOXACIN HCL 400 MG PO TABS: 400.0000 mg | ORAL_TABLET | Freq: Every day | ORAL | Status: DC

## 2015-10-12 NOTE — Patient Instructions (Signed)

## 2015-10-12 NOTE — Progress Notes (Signed)
Pre visit review using our clinic review tool, if applicable. No additional management support is needed unless otherwise documented below in the visit note. 

## 2015-10-13 NOTE — Progress Notes (Signed)
Subjective:  Patient ID: [name], female    DOB: February 05, 1951  Age: 64 y.o. MRN: [id number]  CC: Cough   HPI [name] presents for a 2 week hx of cough productive of yellow phlegm.  Outpatient Prescriptions Prior to Visit  Medication Sig Dispense Refill  . aspirin 81 MG tablet Take 81 mg by mouth 3 (three) times a week. Monday Wednesday Friday    . estradiol (ESTRACE) 0.1 MG/GM vaginal cream 1 gram pv 1-2 times weekly 42.5 g 3  . fesoterodine (TOVIAZ) 8 MG TB24 tablet Take 1 tablet (8 mg total) by mouth daily. 30 tablet 13  . MULTIPLE VITAMIN tablet Take 1 tablet by mouth daily.    . nabumetone (RELAFEN) 750 MG tablet Take one tablet by mouth twice daily as needed  for arthritis 60 tablet 5  . pantoprazole (PROTONIX) 40 MG tablet Take 1 tablet (40 mg total) by mouth daily. 90 tablet 3  . HYDROcodone-acetaminophen (NORCO/VICODIN) 5-325 MG per tablet Take 1 tablet by mouth every 6 (six) hours as needed for moderate pain. 30 tablet 0  . nitrofurantoin, macrocrystal-monohydrate, (MACROBID) 100 MG capsule Take one capsule by mouth one time daily 30 capsule 11  . gabapentin (NEURONTIN) 100 MG capsule One pill every eight hours as needed 30 capsule 2   No facility-administered medications prior to visit.    ROS Review of Systems  Constitutional: Positive for chills. Negative for fever, diaphoresis, activity change, appetite change, fatigue and unexpected weight change.  HENT: Positive for sore throat. Negative for facial swelling, postnasal drip, rhinorrhea, sinus pressure and trouble swallowing.   Eyes: Negative.   Respiratory: Positive for cough. Negative for apnea, choking, chest tightness, shortness of breath, wheezing and stridor.   Cardiovascular: Negative.  Negative for chest pain, palpitations and leg swelling.  Gastrointestinal: Negative.  Negative for nausea, vomiting, abdominal pain, diarrhea, constipation and blood in stool.  Endocrine: Negative.   Genitourinary:  Negative.   Musculoskeletal: Negative.  Negative for myalgias, back pain, joint swelling and arthralgias.  Skin: Negative.  Negative for color change and rash.  Allergic/Immunologic: Negative.   Neurological: Negative.   Hematological: Negative.  Negative for adenopathy. Does not bruise/bleed easily.  Psychiatric/Behavioral: Negative.     Objective:  BP 140/88 mmHg  Pulse 75  Temp(Src) 98.2 F (36.8 C) (Oral)  Resp 20  Ht 5\' 7"  (1.702 m)  Wt 229 lb 8 oz (104.101 kg)  BMI 35.94 kg/m2  SpO2 96%  BP Readings from Last 3 Encounters:  10/12/15 140/88  08/20/15 124/70  07/21/15 136/86    Wt Readings from Last 3 Encounters:  10/12/15 229 lb 8 oz (104.101 kg)  08/20/15 234 lb (106.142 kg)  07/21/15 229 lb (103.874 kg)    Physical Exam  Constitutional: She is oriented to person, place, and time.  Non-toxic appearance. She does not have a sickly appearance. She does not appear ill. No distress.  HENT:  Head: Normocephalic and atraumatic.  Mouth/Throat: Oropharynx is clear and moist. No oropharyngeal exudate.  Eyes: Conjunctivae are normal. Right eye exhibits no discharge. Left eye exhibits no discharge. No scleral icterus.  Neck: Normal range of motion. Neck supple. No JVD present. No tracheal deviation present. No thyromegaly present.  Cardiovascular: Normal rate, regular rhythm, normal heart sounds and intact distal pulses.  Exam reveals no gallop and no friction rub.   No murmur heard. Pulmonary/Chest: Effort normal and breath sounds normal. No stridor. No respiratory distress. She has no wheezes. She has no  rales. She exhibits no tenderness.  Abdominal: Soft. Bowel sounds are normal. She exhibits no distension and no mass. There is no tenderness. There is no rebound and no guarding.  Musculoskeletal: Normal range of motion. She exhibits no edema or tenderness.  Lymphadenopathy:    She has no cervical adenopathy.  Neurological: She is oriented to person, place, and time.    Skin: Skin is warm and dry. No rash noted. She is not diaphoretic. No erythema. No pallor.  Vitals reviewed.   Lab Results  Component Value Date   WBC 8.1 07/14/2015   HGB 13.8 07/14/2015   HCT 40.9 07/14/2015   PLT 232.0 07/14/2015   GLUCOSE 94 07/14/2015   CHOL 166 07/14/2015   TRIG 119.0 07/14/2015   HDL 41.10 07/14/2015   LDLDIRECT 135.1 05/08/2012   LDLCALC 101* 07/14/2015   ALT 14 07/14/2015   AST 14 07/14/2015   NA 143 07/14/2015   K 3.8 07/14/2015   CL 108 07/14/2015   CREATININE 0.80 07/14/2015   BUN 16 07/14/2015   CO2 27 07/14/2015   TSH 4.73* 07/14/2015   HGBA1C 6.1 05/14/2013    Dg Hip Complete Right  08/04/2014  CLINICAL DATA:  RIGHT hip in the pain for 6 months. No recent injury. EXAM: RIGHT HIP - COMPLETE 2+ VIEW COMPARISON:  None. FINDINGS: There is no evidence of hip fracture or dislocation. There is no evidence of arthropathy or other focal bone abnormality. Radiodense material overlies the pubic symphysis region, possibly postsurgical. IMPRESSION: No acute findings. Electronically Signed   By: [name] M.D.   On: 08/04/2014 10:42   Dg Knee Complete 4 Views Right  08/04/2014  CLINICAL DATA:  Right hip and knee pain. EXAM: RIGHT KNEE - COMPLETE 4+ VIEW COMPARISON:  None. FINDINGS: No joint effusion or fracture. Small enthesophyte is seen off the superior margin of the patella. Minimal inferior patellofemoral osteophytosis. IMPRESSION: 1. No acute findings. 2. Minimal degenerative change in the patellofemoral compartment. Electronically Signed   By: [name] M.D.   On: 08/04/2014 10:51    Assessment & Plan:   [name] was seen today for cough.  Diagnoses and all orders for this visit:  Acute bronchitis, unspecified organism -     moxifloxacin (AVELOX) 400 MG tablet; Take 1 tablet (400 mg total) by mouth daily. -     HYDROcodone-homatropine (HYCODAN) 5-1.5 MG/5ML syrup; Take 5 mLs by mouth every 8 (eight) hours as needed for cough.   I have  discontinued [name]'s HYDROcodone-acetaminophen, gabapentin, and nitrofurantoin (macrocrystal-monohydrate). I am also having her start on moxifloxacin and HYDROcodone-homatropine. Additionally, I am having her maintain her aspirin, Multiple Vitamin, estradiol, fesoterodine, pantoprazole, and nabumetone.  Meds ordered this encounter  Medications  . moxifloxacin (AVELOX) 400 MG tablet    Sig: Take 1 tablet (400 mg total) by mouth daily.    Dispense:  7 tablet    Refill:  0  . HYDROcodone-homatropine (HYCODAN) 5-1.5 MG/5ML syrup    Sig: Take 5 mLs by mouth every 8 (eight) hours as needed for cough.    Dispense:  120 mL    Refill:  0     Follow-up: Return in about 3 weeks (around 11/02/2015).  Scarlette Calico, MD

## 2015-10-19 ENCOUNTER — Other Ambulatory Visit: Payer: Self-pay | Admitting: Internal Medicine

## 2015-10-19 ENCOUNTER — Telehealth: Payer: Self-pay | Admitting: Internal Medicine

## 2015-10-19 DIAGNOSIS — M5090 Cervical disc disorder, unspecified, unspecified cervical region: Secondary | ICD-10-CM

## 2015-10-19 NOTE — Telephone Encounter (Signed)
Refer to Dr Posey Pronto to see if MRI warranted C6 radiculopathy as mild

## 2015-10-19 NOTE — Telephone Encounter (Signed)
Received fax from Dr. Elta Guadeloupe Roy's office. They require that the patient have an MRI or CT scan within the last 6 months before they will see pt. Please advise.

## 2015-10-20 NOTE — Telephone Encounter (Signed)
Referral sent to [facility].

## 2015-10-26 NOTE — Telephone Encounter (Signed)
Please put on Call In List for any of Neurologist for cancellations

## 2015-10-26 NOTE — Telephone Encounter (Signed)
Dr. Posey Pronto is booked until early February. Okay for pt to wait that long?

## 2015-11-08 DIAGNOSIS — J189 Pneumonia, unspecified organism: Secondary | ICD-10-CM

## 2015-11-08 HISTORY — DX: Pneumonia, unspecified organism: J18.9

## 2015-12-17 ENCOUNTER — Ambulatory Visit: Payer: Federal, State, Local not specified - PPO | Admitting: Neurology

## 2015-12-29 ENCOUNTER — Ambulatory Visit (INDEPENDENT_AMBULATORY_CARE_PROVIDER_SITE_OTHER): Payer: Federal, State, Local not specified - PPO | Admitting: Certified Nurse Midwife

## 2015-12-29 ENCOUNTER — Encounter: Payer: Self-pay | Admitting: Certified Nurse Midwife

## 2015-12-29 VITALS — BP 112/68 | HR 72 | Resp 16 | Ht 66.75 in | Wt 226.0 lb

## 2015-12-29 DIAGNOSIS — R896 Abnormal cytological findings in specimens from other organs, systems and tissues: Secondary | ICD-10-CM | POA: Diagnosis not present

## 2015-12-29 DIAGNOSIS — N898 Other specified noninflammatory disorders of vagina: Secondary | ICD-10-CM

## 2015-12-29 DIAGNOSIS — N9489 Other specified conditions associated with female genital organs and menstrual cycle: Secondary | ICD-10-CM | POA: Diagnosis not present

## 2015-12-29 DIAGNOSIS — R87629 Unspecified abnormal cytological findings in specimens from vagina: Secondary | ICD-10-CM

## 2015-12-29 NOTE — Progress Notes (Signed)
.  65 y.o. G1P1001 MarriedCaucasian  F here for repeat Pap smear due to history of low-grade squamous intraepithelial neoplasia (LGSIL - encompassing HPV,mild dysplasia,CIN I) on pap done 06/17/15 and no abnormalities on colpo done 06/23/16.  Patient history of conization for cancer per patient no record and TVH ? Cancer noted on pathology.( no records) patient continues to have vaginal dryness and is concerned with using Estrace cream routinely. Use once every 2 weeks. Sporadic stress incontinence with pad use. Sees Urology for management. Some increase in vaginal discharge today, no itching or burning. No new personal products. No other concerns today.   No LMP recorded. Patient has had a hysterectomy.[name] Kitchen   EXAM: BP 112/68 mmHg  Pulse 72  Resp 16  Ht 5' 6.75" (1.695 m)  Wt 226 lb (102.513 kg)  BMI 35.68 kg/m2   General appearance:  WNWD Female, NAD Pelvic exam: VULVA: normal appearing vulva with no masses, tenderness or lesions, VAGINA: normal appearing vagina with normal color and discharge, no lesions, atrophic, scar tissue noted on right with no change in appearance, CERVIX: absent,  UTERUS: absent ,  ADNEXA: normal adnexa in size, nontender and no masses,  . Pap smear obtained. Affirm obtained  Assessment:Normal pelvic exam  History of LSIL on vaginal pap with negative with negative colposcopy Atrophic Vaginitis Estrace limited use 6 month pap smear today R/O vaginal infection History of cervical cone and TVH for bleeding   Plan: Discussed normal exam finding and scar tissue no apparent change in appearance or feel. If pap negative no treatment, if abnormal per results Discussed coconut oil use on daily basis and stop Estrace( she essentially is not using) to help with dryness. Patient will use this. Lab: Pap, Affirm  Rv aex and per results

## 2015-12-30 LAB — WET PREP BY MOLECULAR PROBE
Candida species: NEGATIVE
Gardnerella vaginalis: NEGATIVE
Trichomonas vaginosis: NEGATIVE

## 2015-12-31 LAB — IPS PAP TEST WITH HPV

## 2016-01-01 NOTE — Progress Notes (Signed)
Encounter reviewed Jill Jertson, MD   

## 2016-01-06 ENCOUNTER — Telehealth: Payer: Self-pay

## 2016-01-06 NOTE — Telephone Encounter (Signed)
Patient notified of results. See lab 

## 2016-01-06 NOTE — Telephone Encounter (Signed)
-----   Message from Regina Eck, CNM sent at 01/06/2016  8:03 AM EST ----- Notify patient that pap shows LSIL with positive HPVHR, no change. Guidelines regarding this type of pap smear are to repeat again in 6  Months at your aex. I also consulted with Dr. Sabra Heck and she agrees this is the plan, due to low risk findings.   Pap recall 6 months with aex.

## 2016-01-06 NOTE — Telephone Encounter (Signed)
lmtcb

## 2016-01-14 ENCOUNTER — Ambulatory Visit (INDEPENDENT_AMBULATORY_CARE_PROVIDER_SITE_OTHER): Payer: Federal, State, Local not specified - PPO | Admitting: Adult Health

## 2016-01-14 VITALS — BP 122/80 | HR 108 | Temp 98.8°F | Ht 66.75 in | Wt 228.0 lb

## 2016-01-14 DIAGNOSIS — J069 Acute upper respiratory infection, unspecified: Secondary | ICD-10-CM | POA: Diagnosis not present

## 2016-01-14 LAB — POCT INFLUENZA A/B
INFLUENZA A, POC: NEGATIVE
Influenza B, POC: NEGATIVE

## 2016-01-14 NOTE — Progress Notes (Signed)
Pre visit review using our clinic review tool, if applicable. No additional management support is needed unless otherwise documented below in the visit note. 

## 2016-01-14 NOTE — Patient Instructions (Signed)
It was great meeting you today!   I am sorry you are not feeling well. Your exam is consistent with a viral upper respiratory infection. This should clear within a few days.   Your flu swab was negative.   You can use Afrin or Sudafed to get rid of the fluid behind the ear.

## 2016-01-14 NOTE — Progress Notes (Signed)
Subjective:    Patient ID: [name], female    DOB: 1950-12-15, 65 y.o.   MRN: [id number]  HPI 65 year old female who presents to the office today for cough, body aches, head aches, ear pain, and sore throat since yesterday at 5:30 pm.   She denies any sick contacts. She has not tried anything over the counter.   She takes care of her 85 month old grandson during the day    Review of Systems  Constitutional: Positive for chills, activity change and fatigue. Negative for fever, diaphoresis and appetite change.  HENT: Positive for congestion, ear pain, sinus pressure and sore throat. Negative for ear discharge, facial swelling, postnasal drip and rhinorrhea.   Eyes: Negative.   Respiratory: Positive for cough.   Cardiovascular: Negative.   Gastrointestinal: Negative for nausea, diarrhea, constipation and rectal pain.  Musculoskeletal: Positive for myalgias.  Skin: Negative.   Allergic/Immunologic: Negative.   All other systems reviewed and are negative.  Past Medical History  Diagnosis Date  . GERD (gastroesophageal reflux disease)   . Arthritis   . Diverticulosis 02/11/2012  . OVERACTIVE BLADDER 08/10/2007    Qualifier: Diagnosis of  By: Sherlynn Stalls, CMA, [place]    . GERD 08/10/2007    Qualifier: Diagnosis of  By: Sherlynn Stalls, CMA, [place]    . ARTHRITIS, KNEE 02/10/2009    Qualifier: Diagnosis of  By: Niel Hummer MD, Baldemar Lenis, ATROPHIC 11/11/2008    Qualifier: Diagnosis of  By: Niel Hummer MD, Lorinda Creed HEMORRHOIDS-INTERNAL 07/21/2010    Qualifier: Diagnosis of  By: Chester Holstein NP, Nevin Bloodgood    . Cervical cancer ([place])   . Allergic rhinitis, cause unspecified   . Chronic LBP   . Obesity   . Degenerative arthritis of hip 02/11/2012  . Cervical disc disease 02/11/2012  . Hyperlipidemia 02/13/2012  . Recurrent UTI   . Hiatal hernia   . GERD (gastroesophageal reflux disease)   . SUI (stress urinary incontinence, female)   . OAB (overactive bladder)   . BRCA negative 03/25/13  . Mitral  valve prolapse   . Pneumonia   . PONV (postoperative nausea and vomiting)   . Abnormal Pap smear of vagina 06/17/15    LGSIL    Social History   Social History  . Marital Status: Married    Spouse Name: N/A  . Number of Children: N/A  . Years of Education: N/A   Occupational History  . Not on file.   Social History Main Topics  . Smoking status: Never Smoker   . Smokeless tobacco: Never Used  . Alcohol Use: 0.0 oz/week    0 Standard drinks or equivalent per week     Comment: 1 q month or 2  . Drug Use: No  . Sexual Activity:    Partners: Male    Birth Control/ Protection: Post-menopausal     Comment: hysterectomy   Other Topics Concern  . Not on file   Social History Narrative    Past Surgical History  Procedure Laterality Date  . Cervical discectomy  2002  . Bunionectomy  2007  . Shoulder arthroscopy  2004    frozen shoulder  . Tonsillectomy    . Cystocele repair  2003    rectocele repair  . Vaginal hysterectomy  1986  . Conization of cervix  1982    with D&C  . Eye surgery Bilateral     lasik  . Colonoscopy    . Cholecystectomy N/A  08/13/2013    Procedure: LAPAROSCOPIC CHOLECYSTECTOMY WITH INTRAOPERATIVE CHOLANGIOGRAM;  Surgeon: Shann Medal, MD;  Location: [facility] OR;  Service: General;  Laterality: N/A;    Family History  Problem Relation Age of Onset  . Breast cancer Mother   . Heart disease Sister   . Breast cancer Sister   . Diabetes Brother   . Heart disease Brother     triple bypass  . Diabetes Maternal Aunt   . Diabetes Maternal Uncle   . Cancer Maternal Grandmother     uterine    Allergies  Allergen Reactions  . Amoxicillin     REACTION: unspecified  . Penicillins Other (See Comments)    Unknown reaction    Current Outpatient Prescriptions on File Prior to Visit  Medication Sig Dispense Refill  . aspirin 81 MG tablet Take 81 mg by mouth 3 (three) times a week. Monday Wednesday Friday    . estradiol (ESTRACE) 0.1 MG/GM vaginal cream  1 gram pv 1-2 times weekly 42.5 g 3  . MULTIPLE VITAMIN tablet Take 1 tablet by mouth daily.    . nabumetone (RELAFEN) 750 MG tablet Take one tablet by mouth twice daily as needed  for arthritis 60 tablet 5  . pantoprazole (PROTONIX) 40 MG tablet Take 1 tablet (40 mg total) by mouth daily. 90 tablet 3  . HYDROcodone-homatropine (HYCODAN) 5-1.5 MG/5ML syrup Take 5 mLs by mouth every 8 (eight) hours as needed for cough. (Patient not taking: Reported on 01/14/2016) 120 mL 0   No current facility-administered medications on file prior to visit.    BP 122/80 mmHg  Pulse 108  Temp(Src) 98.8 F (37.1 C) (Oral)  Ht 5' 6.75" (1.695 m)  Wt 228 lb (103.42 kg)  BMI 36.00 kg/m2  SpO2 94%       Objective:   Physical Exam  Constitutional: She is oriented to person, place, and time. She appears well-developed and well-nourished. No distress.  HENT:  Head: Normocephalic and atraumatic.  Right Ear: Hearing, external ear and ear canal normal. A middle ear effusion is present.  Left Ear: Hearing, external ear and ear canal normal. A middle ear effusion is present.  Nose: Right sinus exhibits frontal sinus tenderness. Left sinus exhibits frontal sinus tenderness.  Mouth/Throat: Oropharynx is clear and moist. No oropharyngeal exudate.  Fluid behind TM, not infectious appearing  Cardiovascular: Normal rate, regular rhythm, normal heart sounds and intact distal pulses.  Exam reveals no gallop and no friction rub.   No murmur heard. Pulmonary/Chest: Effort normal and breath sounds normal. No respiratory distress. She has no wheezes. She has no rales. She exhibits no tenderness.  Neurological: She is alert and oriented to person, place, and time.  Skin: Skin is warm and dry. No rash noted. She is not diaphoretic. No erythema. No pallor.  Psychiatric: She has a normal mood and affect. Her behavior is normal. Judgment and thought content normal.  Nursing note and vitals reviewed.      Assessment & Plan:    1. Upper respiratory infection - Likely viral  - Advised decongestant like sudafed or Afrin for fluid behind TM - Tylenol or Ibuprofen for symptom management.  - Follow up if no improvement in 2-3 days.  - POC Influenza A/B- Negative

## 2016-05-18 ENCOUNTER — Encounter: Payer: Self-pay | Admitting: *Deleted

## 2016-06-08 ENCOUNTER — Telehealth: Payer: Self-pay | Admitting: *Deleted

## 2016-06-08 NOTE — Telephone Encounter (Signed)
Patient notified of BMD results. Patient verbalized understanding.

## 2016-06-21 ENCOUNTER — Encounter: Payer: Self-pay | Admitting: Certified Nurse Midwife

## 2016-06-21 ENCOUNTER — Ambulatory Visit (INDEPENDENT_AMBULATORY_CARE_PROVIDER_SITE_OTHER): Payer: Federal, State, Local not specified - PPO | Admitting: Certified Nurse Midwife

## 2016-06-21 VITALS — BP 118/72 | HR 72 | Resp 16 | Ht 67.0 in | Wt 228.0 lb

## 2016-06-21 DIAGNOSIS — N952 Postmenopausal atrophic vaginitis: Secondary | ICD-10-CM

## 2016-06-21 DIAGNOSIS — Z01419 Encounter for gynecological examination (general) (routine) without abnormal findings: Secondary | ICD-10-CM

## 2016-06-21 DIAGNOSIS — Z124 Encounter for screening for malignant neoplasm of cervix: Secondary | ICD-10-CM | POA: Diagnosis not present

## 2016-06-21 MED ORDER — ESTRADIOL 10 MCG VA TABS: 1.0000 | ORAL_TABLET | VAGINAL | 10 refills | Status: DC

## 2016-06-21 NOTE — Progress Notes (Signed)
65 y.o. [id number] Married  Caucasian Fe here for annual exam. Menopausal no HRT, denies vaginal bleeding or vaginal dryness. Some exercise with running after 37 month old grandson. Vaginal dryness no change with coconut oil, would like to change to Vagifem.  Used Estrace before and felt it was messy.. Still having some vaginal pain with intercourse. Sees PCP for aex and labs and medication management. No other health issues today. Took a beach vacation!  No LMP recorded. Patient has had a hysterectomy.          Sexually active: Yes.    The current method of family planning is status post hysterectomy.    Exercising: No.  exercise Smoker:  no  Health Maintenance: Pap:  12/29/15 LSIL HPV HR+ f/u 24mhs MMG: 05-11-16 category b density birads 1:neg Colonoscopy:  2011 f/u 185yrBMD:   2017 low bone mass TDaP:  2015 Shingles: no Pneumonia: no Hep C and HIV: not done Labs: pcp Self breast exam: done occ   reports that she has never smoked. She has never used smokeless tobacco. She reports that she does not drink alcohol or use drugs.  Past Medical History:  Diagnosis Date  . Abnormal Pap smear of vagina 06/17/15   LGSIL  . Allergic rhinitis, cause unspecified   . Arthritis   . ARTHRITIS, KNEE 02/10/2009   Qualifier: Diagnosis of  By: StNiel HummerD, WiLorinda Creed . BRCA negative 03/25/13  . [place]  . Cervical disc disease 02/11/2012  . Chronic LBP   . Degenerative arthritis of hip 02/11/2012  . Diverticulosis 02/11/2012  . GERD 08/10/2007   Qualifier: Diagnosis of  By: WySherlynn StallsCMA, [place]  . GERD (gastroesophageal reflux disease)   . GERD (gastroesophageal reflux disease)   . HEMORRHOIDS-INTERNAL 07/21/2010   Qualifier: Diagnosis of  By: GuChester HolsteinP, PaNevin Bloodgood  . Hiatal hernia   . Hyperlipidemia 02/13/2012  . Mitral valve prolapse   . OAB (overactive bladder)   . Obesity   . OVERACTIVE BLADDER 08/10/2007   Qualifier: Diagnosis of  By: WySherlynn StallsCMA, [place]  . Pneumonia   . PONV  (postoperative nausea and vomiting)   . Recurrent UTI   . SUI (stress urinary incontinence, female)   . VAGINITIS, ATROPHIC 11/11/2008   Qualifier: Diagnosis of  By: StNiel HummerD, WiLorinda Creed   Past Surgical History:  Procedure Laterality Date  . BUNIONECTOMY  2007  . CERVICAL DISCECTOMY  2002  . CHOLECYSTECTOMY N/A 08/13/2013   Procedure: LAPAROSCOPIC CHOLECYSTECTOMY WITH INTRAOPERATIVE CHOLANGIOGRAM;  Surgeon: DaShann MedalMD;  [facility] Service: [facility];  Laterality: N/A;  . COLONOSCOPY    . conization of cervix  1982   with D&C  . CYSTOCELE REPAIR  2003   rectocele repair  . EYE SURGERY Bilateral    lasik  . SHOULDER ARTHROSCOPY  2004   frozen shoulder  . TONSILLECTOMY    . VAGINAL HYSTERECTOMY  1986    Current Outpatient Prescriptions  Medication Sig Dispense Refill  . aspirin 81 MG tablet Take 81 mg by mouth as needed.     . MULTIPLE VITAMIN tablet Take 1 tablet by mouth daily.    . nabumetone (RELAFEN) 750 MG tablet Take one tablet by mouth twice daily as needed  for arthritis 60 tablet 5  . pantoprazole (PROTONIX) 40 MG tablet Take 1 tablet (40 mg total) by mouth daily. 90 tablet 3  . estradiol (ESTRACE) 0.1 MG/GM  vaginal cream 1 gram pv 1-2 times weekly (Patient not taking: Reported on 06/21/2016) 42.5 g 3   No current facility-administered medications for this visit.     Family History  Problem Relation Age of Onset  . Breast cancer Mother   . Heart disease Sister   . Breast cancer Sister   . Diabetes Brother   . Heart disease Brother     triple bypass  . Diabetes Maternal Aunt   . Diabetes Maternal Uncle   . Cancer Maternal Grandmother     uterine    ROS:  Pertinent items are noted in HPI.  Otherwise, a comprehensive ROS was negative.  Exam:   BP 118/72   Pulse 72   Resp 16   Ht '5\' 7"'  (1.702 m)   Wt 228 lb (103.4 kg)   BMI 35.71 kg/m  Height: '5\' 7"'  (170.2 cm) Ht Readings from Last 3 Encounters:  06/21/16 '5\' 7"'  (1.702 m)  01/14/16 5'  6.75" (1.695 m)  12/29/15 5' 6.75" (1.695 m)    General appearance: alert, cooperative and appears stated age Head: Normocephalic, without obvious abnormality, atraumatic Neck: no adenopathy, supple, symmetrical, trachea midline and thyroid normal to inspection and palpation Lungs: clear to auscultation bilaterally Breasts: normal appearance, no masses or tenderness, No nipple retraction or dimpling, No nipple discharge or bleeding, No axillary or supraclavicular adenopathy Heart: regular rate and rhythm Abdomen: soft, non-tender; no masses,  no organomegaly Extremities: extremities normal, atraumatic, no cyanosis or edema Skin: Skin color, texture, turgor normal. No rashes or lesions Lymph nodes: Cervical, supraclavicular, and axillary nodes normal. No abnormal inguinal nodes palpated Neurologic: Grossly normal   Pelvic: External genitalia:  no lesions              Urethra:  normal appearing urethra with no masses, tenderness or lesions              Bartholin's and Skene's: normal                 Vagina: normal appearing vagina with normal color and discharge, no lesions              Cervix: absent              Pap taken: Yes.   Bimanual Exam:  Uterus:  uterus absent              Adnexa: normal adnexa and no mass, fullness, tenderness               Rectovaginal: Confirms               Anus:  normal sphincter tone, no lesions  Chaperone present: yes  A:  Well Woman with normal exam  Menopausal no HRT s/p TVH ovaries retained  Atrophic vaginitis desires vaginal estrogen again  History of LSIL + HPVHR follow up vaginal pap today  P:   Reviewed health and wellness pertinent to exam  Aware of need to evaluate if vaginal bleeding.  Rx Vagifem see order with instructions  Pap smear(vaginal) as above with HPV reflex   counseled on breast self exam, mammography screening, adequate intake of calcium and vitamin D, diet and exercise  return annually or prn  An After Visit Summary  was printed and given to the patient.

## 2016-06-21 NOTE — Patient Instructions (Signed)

## 2016-06-23 LAB — IPS PAP TEST WITH REFLEX TO HPV

## 2016-06-25 NOTE — Progress Notes (Signed)
Encounter reviewed Rena Hunke, MD   

## 2016-07-13 ENCOUNTER — Other Ambulatory Visit (INDEPENDENT_AMBULATORY_CARE_PROVIDER_SITE_OTHER): Payer: Federal, State, Local not specified - PPO

## 2016-07-13 DIAGNOSIS — R7989 Other specified abnormal findings of blood chemistry: Secondary | ICD-10-CM | POA: Diagnosis not present

## 2016-07-13 DIAGNOSIS — Z0000 Encounter for general adult medical examination without abnormal findings: Secondary | ICD-10-CM | POA: Diagnosis not present

## 2016-07-13 LAB — CBC WITH DIFFERENTIAL/PLATELET
Basophils Absolute: 0.1 K/uL (ref 0.0–0.1)
Basophils Relative: 0.8 % (ref 0.0–3.0)
Eosinophils Absolute: 0.2 K/uL (ref 0.0–0.7)
Eosinophils Relative: 3.2 % (ref 0.0–5.0)
HCT: 39.2 % (ref 36.0–46.0)
Hemoglobin: 13.3 g/dL (ref 12.0–15.0)
Lymphocytes Relative: 39.9 % (ref 12.0–46.0)
Lymphs Abs: 3 K/uL (ref 0.7–4.0)
MCHC: 34 g/dL (ref 30.0–36.0)
MCV: 87.6 fl (ref 78.0–100.0)
Monocytes Absolute: 0.4 K/uL (ref 0.1–1.0)
Monocytes Relative: 5.4 % (ref 3.0–12.0)
Neutro Abs: 3.8 K/uL (ref 1.4–7.7)
Neutrophils Relative %: 50.7 % (ref 43.0–77.0)
Platelets: 226 K/uL (ref 150.0–400.0)
RBC: 4.47 Mil/uL (ref 3.87–5.11)
RDW: 13.1 % (ref 11.5–15.5)
WBC: 7.4 K/uL (ref 4.0–10.5)

## 2016-07-13 LAB — BASIC METABOLIC PANEL
BUN: 20 mg/dL (ref 6–23)
CALCIUM: 8.8 mg/dL (ref 8.4–10.5)
CO2: 25 mEq/L (ref 19–32)
CREATININE: 0.9 mg/dL (ref 0.40–1.20)
Chloride: 107 mEq/L (ref 96–112)
GFR: 67.26 mL/min (ref >60.00)
GLUCOSE: 98 mg/dL (ref 70–99)
POTASSIUM: 3.7 meq/L (ref 3.5–5.1)
Sodium: 139 mEq/L (ref 135–145)

## 2016-07-13 LAB — URINALYSIS, ROUTINE W REFLEX MICROSCOPIC
Bilirubin Urine: NEGATIVE
Hgb urine dipstick: NEGATIVE
Ketones, ur: NEGATIVE
Leukocytes, UA: NEGATIVE
Nitrite: NEGATIVE
RBC / HPF: NONE SEEN
Specific Gravity, Urine: 1.02
Total Protein, Urine: NEGATIVE
Urine Glucose: NEGATIVE
Urobilinogen, UA: 0.2
pH: 6 (ref 5.0–8.0)

## 2016-07-13 LAB — LIPID PANEL
CHOL/HDL RATIO: 4
CHOLESTEROL: 174 mg/dL (ref 0–200)
HDL: 45.5 mg/dL (ref >39.00)
LDL CALC: 111 mg/dL — AB (ref 0–99)
NonHDL: 128.86
TRIGLYCERIDES: 87 mg/dL (ref 0.0–149.0)
VLDL: 17.4 mg/dL (ref 0.0–40.0)

## 2016-07-13 LAB — HEPATIC FUNCTION PANEL
ALK PHOS: 84 U/L (ref 39–117)
ALT: 16 U/L (ref 0–35)
AST: 15 U/L (ref 0–37)
Albumin: 3.9 g/dL (ref 3.5–5.2)
BILIRUBIN DIRECT: 0.2 mg/dL (ref 0.0–0.3)
BILIRUBIN TOTAL: 0.7 mg/dL (ref 0.2–1.2)
TOTAL PROTEIN: 6.7 g/dL (ref 6.0–8.3)

## 2016-07-13 LAB — TSH: TSH: 3.04 u[IU]/mL (ref 0.35–4.50)

## 2016-07-13 LAB — T4, FREE: Free T4: 0.96 ng/dL (ref 0.60–1.60)

## 2016-07-14 LAB — HEPATITIS C ANTIBODY: HCV Ab: NEGATIVE

## 2016-07-21 ENCOUNTER — Ambulatory Visit (INDEPENDENT_AMBULATORY_CARE_PROVIDER_SITE_OTHER): Payer: Federal, State, Local not specified - PPO | Admitting: Internal Medicine

## 2016-07-21 ENCOUNTER — Encounter: Payer: Self-pay | Admitting: Internal Medicine

## 2016-07-21 VITALS — BP 128/80 | HR 80 | Temp 98.3°F | Resp 20 | Wt 225.0 lb

## 2016-07-21 DIAGNOSIS — R7302 Impaired glucose tolerance (oral): Secondary | ICD-10-CM | POA: Diagnosis not present

## 2016-07-21 DIAGNOSIS — Z23 Encounter for immunization: Secondary | ICD-10-CM

## 2016-07-21 DIAGNOSIS — Z0000 Encounter for general adult medical examination without abnormal findings: Secondary | ICD-10-CM | POA: Diagnosis not present

## 2016-07-21 NOTE — Patient Instructions (Addendum)

## 2016-07-21 NOTE — Assessment & Plan Note (Signed)

## 2016-07-21 NOTE — Progress Notes (Signed)
Pre visit review using our clinic review tool, if applicable. No additional management support is needed unless otherwise documented below in the visit note. 

## 2016-07-21 NOTE — Progress Notes (Signed)
Subjective:    Patient ID: [name], female    DOB: [birth year], 65 y.o.   MRN: [id number]  HPI  Here for wellness and f/u;  Overall doing ok;  Pt denies Chest pain, worsening SOB, DOE, wheezing, orthopnea, PND, worsening LE edema, palpitations, or syncope.  Pt denies neurological change such as new headache, facial or extremity weakness.  Pt denies polydipsia, polyuria, or low sugar symptoms. Pt states overall good compliance with treatment and medications, good tolerability, and has been trying to follow appropriate diet.  Pt denies worsening depressive symptoms, suicidal ideation or panic. No fever, night sweats, wt loss, loss of appetite, or other constitutional symptoms.  Pt states good ability with ADL's, has low fall risk, home safety reviewed and adequate, no other significant changes in hearing or vision, and only occasionally active with exercise. Keeping for daughter the grandchild in the home during the day while working from home.  Due for flu shot Has some dizziness with position change since May 2017, slipped in shower, struck neck on soap dish, not head strike , no LOC, no significant injury. Past Medical History:  Diagnosis Date  . Abnormal Pap smear of vagina 06/17/15   LGSIL  . Allergic rhinitis, cause unspecified   . Arthritis   . ARTHRITIS, KNEE 02/10/2009   Qualifier: Diagnosis of  By: Niel Hummer MD, Lorinda Creed   . BRCA negative 03/25/13  . Cervical cancer ([place])   . Cervical disc disease 02/11/2012  . Chronic LBP   . Degenerative arthritis of hip 02/11/2012  . Diverticulosis 02/11/2012  . GERD 08/10/2007   Qualifier: Diagnosis of  By: Sherlynn Stalls, CMA, [place]    . GERD (gastroesophageal reflux disease)   . GERD (gastroesophageal reflux disease)   . HEMORRHOIDS-INTERNAL 07/21/2010   Qualifier: Diagnosis of  By: Chester Holstein NP, Nevin Bloodgood    . Hiatal hernia   . Hyperlipidemia 02/13/2012  . Mitral valve prolapse   . OAB (overactive bladder)   . Obesity   . OVERACTIVE BLADDER 08/10/2007   Qualifier: Diagnosis of  By: Sherlynn Stalls, CMA, [place]    . Pneumonia   . PONV (postoperative nausea and vomiting)   . Recurrent UTI   . SUI (stress urinary incontinence, female)   . VAGINITIS, ATROPHIC 11/11/2008   Qualifier: Diagnosis of  By: Niel Hummer MD, Lorinda Creed    Past Surgical History:  Procedure Laterality Date  . BUNIONECTOMY  2007  . CERVICAL DISCECTOMY  2002  . CHOLECYSTECTOMY N/A 08/13/2013   Procedure: LAPAROSCOPIC CHOLECYSTECTOMY WITH INTRAOPERATIVE CHOLANGIOGRAM;  Surgeon: Shann Medal, MD;  Location: [place];  Service: General;  Laterality: N/A;  . COLONOSCOPY    . conization of cervix  1982   with D&C  . CYSTOCELE REPAIR  2003   rectocele repair  . EYE SURGERY Bilateral    lasik  . SHOULDER ARTHROSCOPY  2004   frozen shoulder  . TONSILLECTOMY    . VAGINAL HYSTERECTOMY  1986    reports that she has never smoked. She has never used smokeless tobacco. She reports that she does not drink alcohol or use drugs. family history includes Breast cancer in her mother and sister; Cancer in her maternal grandmother; Diabetes in her brother, maternal aunt, and maternal uncle; Heart disease in her brother and sister. Allergies  Allergen Reactions  . Amoxicillin     REACTION: unspecified  . Penicillins Other (See Comments)    Unknown reaction   Current Outpatient Prescriptions on File Prior to Visit  Medication Sig  Dispense Refill  . aspirin 81 MG tablet Take 81 mg by mouth as needed.     . Estradiol 10 MCG TABS vaginal tablet Place 1 tablet (10 mcg total) vaginally 2 (two) times a week. 8 tablet 10  . MULTIPLE VITAMIN tablet Take 1 tablet by mouth daily.    . nabumetone (RELAFEN) 750 MG tablet Take one tablet by mouth twice daily as needed  for arthritis 60 tablet 5  . pantoprazole (PROTONIX) 40 MG tablet Take 1 tablet (40 mg total) by mouth daily. 90 tablet 3   No current facility-administered medications on file prior to visit.    Review of Systems Constitutional: Negative  for increased diaphoresis, or other activity, appetite or siginficant weight change other than noted HENT: Negative for worsening hearing loss, ear pain, facial swelling, mouth sores and neck stiffness.   Eyes: Negative for other worsening pain, redness or visual disturbance.  Respiratory: Negative for choking or stridor Cardiovascular: Negative for other chest pain and palpitations.  Gastrointestinal: Negative for worsening diarrhea, blood in stool, or abdominal distention Genitourinary: Negative for hematuria, flank pain or change in urine volume.  Musculoskeletal: Negative for myalgias or other joint complaints.  Skin: Negative for other color change and wound or drainage.  Neurological: Negative for syncope and numbness. other than noted Hematological: Negative for adenopathy. or other swelling Psychiatric/Behavioral: Negative for hallucinations, SI, self-injury, decreased concentration or other worsening agitation.      Objective:   Physical Exam BP 128/80   Pulse 80   Temp 98.3 F (36.8 C) (Oral)   Resp 20   Wt 225 lb (102.1 kg)   SpO2 97%   BMI 35.24 kg/m  VS noted,  Constitutional: Pt is oriented to person, place, and time. Appears well-developed and well-nourished, in no significant distress Head: Normocephalic and atraumatic  Eyes: Conjunctivae and EOM are normal. Pupils are equal, round, and reactive to light Right Ear: External ear normal.  Left Ear: External ear normal Nose: Nose normal.  Mouth/Throat: Oropharynx is clear and moist  Neck: Normal range of motion. Neck supple. No JVD present. No tracheal deviation present or significant neck LA or mass Cardiovascular: Normal rate, regular rhythm, normal heart sounds and intact distal pulses.   Pulmonary/Chest: Effort normal and breath sounds without rales or wheezing  Abdominal: Soft. Bowel sounds are normal. NT. No HSM  Musculoskeletal: Normal range of motion. Exhibits no edema Lymphadenopathy: Has no cervical  adenopathy.  Neurological: Pt is alert and oriented to person, place, and time. Pt has normal reflexes. No cranial nerve deficit. Motor grossly intact Skin: Skin is warm and dry. No rash noted or new ulcers Psychiatric:  Has normal mood and affect. Behavior is normal.     Assessment & Plan:

## 2016-08-02 ENCOUNTER — Encounter: Payer: Self-pay | Admitting: Nurse Practitioner

## 2016-08-02 ENCOUNTER — Telehealth: Payer: Self-pay | Admitting: Internal Medicine

## 2016-08-02 ENCOUNTER — Ambulatory Visit (INDEPENDENT_AMBULATORY_CARE_PROVIDER_SITE_OTHER): Payer: Federal, State, Local not specified - PPO | Admitting: Nurse Practitioner

## 2016-08-02 VITALS — BP 122/74 | HR 88 | Temp 98.0°F | Ht 67.0 in | Wt 227.0 lb

## 2016-08-02 DIAGNOSIS — N3001 Acute cystitis with hematuria: Secondary | ICD-10-CM | POA: Diagnosis not present

## 2016-08-02 DIAGNOSIS — J069 Acute upper respiratory infection, unspecified: Secondary | ICD-10-CM | POA: Diagnosis not present

## 2016-08-02 LAB — POCT URINALYSIS DIPSTICK
Bilirubin, UA: NEGATIVE
GLUCOSE UA: NEGATIVE
Ketones, UA: NEGATIVE
NITRITE UA: POSITIVE
PH UA: 5.5
Spec Grav, UA: >=1.03
UROBILINOGEN UA: 0.2

## 2016-08-02 MED ORDER — GUAIFENESIN-DM 100-10 MG/5ML PO SYRP: 5.0000 mL | ORAL_SOLUTION | ORAL | 0 refills | Status: DC | PRN

## 2016-08-02 MED ORDER — IPRATROPIUM BROMIDE 0.03 % NA SOLN: 2.0000 | Freq: Two times a day (BID) | NASAL | 12 refills | Status: DC

## 2016-08-02 MED ORDER — METHYLPREDNISOLONE ACETATE 40 MG/ML IJ SUSP
40.0000 mg | Freq: Once | INTRAMUSCULAR | Status: AC
  Administered 2016-08-02: 40 mg via INTRAMUSCULAR

## 2016-08-02 MED ORDER — NITROFURANTOIN MACROCRYSTAL 100 MG PO CAPS: 100.0000 mg | ORAL_CAPSULE | Freq: Two times a day (BID) | ORAL | 0 refills | Status: DC

## 2016-08-02 MED ORDER — BENZONATATE 100 MG PO CAPS: 100.0000 mg | ORAL_CAPSULE | Freq: Three times a day (TID) | ORAL | 0 refills | Status: DC | PRN

## 2016-08-02 NOTE — Progress Notes (Signed)
Reviewed with patient in office. See office note

## 2016-08-02 NOTE — Patient Instructions (Signed)
For urinary symptoms, take oral abx as prescribed. Push oral hydration.  URI Instructions: Use over-the-counter  "cold" medicines  such as "Tylenol cold" , "Advil cold",  "Mucinex" or" Mucinex D"  for cough and congestion.  Avoid decongestants if you have high blood pressure. Use" Delsym" or" Robitussin" cough syrup varietis for cough.  You can use plain "Tylenol" or "Advi"l for fever, chills and achyness.   "Common cold" symptoms are usually triggered by a virus.  The antibiotics are usually not necessary. On average, a" viral cold" illness would take 4-7 days to resolve. Please, make an appointment if you are not better or if you're worse.

## 2016-08-02 NOTE — Progress Notes (Signed)
Patient received education resource, including the self-management goal and tool. Patient verbalized understanding. 

## 2016-08-02 NOTE — Progress Notes (Signed)
Subjective:  Patient ID: [name], female    DOB: [birth year]  Age: 65 y.o. MRN: [id number]  CC: Cough (Pt stated coughing, wheezing, throat is sore for about 1 week)   URI   This is a new problem. The current episode started in the past 7 days. The problem has been unchanged. There has been no fever. Associated symptoms include congestion, coughing, dysuria, headaches, rhinorrhea, sinus pain, sneezing, a sore throat and swollen glands. Pertinent negatives include no chest pain, nausea, neck pain, plugged ear sensation, rash, vomiting or wheezing. She has tried acetaminophen and decongestant for the symptoms. The treatment provided mild relief.    Outpatient Medications Prior to Visit  Medication Sig Dispense Refill  . aspirin 81 MG tablet Take 81 mg by mouth as needed.     . Estradiol 10 MCG TABS vaginal tablet Place 1 tablet (10 mcg total) vaginally 2 (two) times a week. 8 tablet 10  . MULTIPLE VITAMIN tablet Take 1 tablet by mouth daily.    . nabumetone (RELAFEN) 750 MG tablet Take one tablet by mouth twice daily as needed  for arthritis 60 tablet 5  . pantoprazole (PROTONIX) 40 MG tablet Take 1 tablet (40 mg total) by mouth daily. 90 tablet 3  . nitrofurantoin (MACRODANTIN) 100 MG capsule Take 100 mg by mouth 4 (four) times daily.     No facility-administered medications prior to visit.     ROS See HPI  Objective:  BP 122/74 (BP Location: Left Arm, Patient Position: Sitting, Cuff Size: Large)   Pulse 88   Temp 98 F (36.7 C)   Ht 5\' 7"  (1.702 m)   Wt 227 lb (103 kg)   SpO2 93%   BMI 35.55 kg/m   BP Readings from Last 3 Encounters:  08/02/16 122/74  07/21/16 128/80  06/21/16 118/72    Wt Readings from Last 3 Encounters:  08/02/16 227 lb (103 kg)  07/21/16 225 lb (102.1 kg)  06/21/16 228 lb (103.4 kg)    Physical Exam  Constitutional: She is oriented to person, place, and time. No distress.  HENT:  Right Ear: External ear normal.  Left Ear: External ear  normal.  Nose: Mucosal edema and rhinorrhea present. Right sinus exhibits no maxillary sinus tenderness and no frontal sinus tenderness. Left sinus exhibits no maxillary sinus tenderness and no frontal sinus tenderness.  Mouth/Throat: Uvula is midline. Posterior oropharyngeal erythema present. No oropharyngeal exudate.  Eyes: No scleral icterus.  Neck: Normal range of motion. Neck supple.  Cardiovascular: Normal rate, regular rhythm and normal heart sounds.   Pulmonary/Chest: Effort normal and breath sounds normal. No respiratory distress.  Musculoskeletal: Normal range of motion. She exhibits no edema.  Lymphadenopathy:    She has no cervical adenopathy.  Neurological: She is alert and oriented to person, place, and time.  Skin: Skin is warm and dry.  Psychiatric: She has a normal mood and affect. Her behavior is normal.  Vitals reviewed.   Lab Results  Component Value Date   WBC 7.4 07/13/2016   HGB 13.3 07/13/2016   HCT 39.2 07/13/2016   PLT 226.0 07/13/2016   GLUCOSE 98 07/13/2016   CHOL 174 07/13/2016   TRIG 87.0 07/13/2016   HDL 45.50 07/13/2016   LDLDIRECT 135.1 05/08/2012   LDLCALC 111 (H) 07/13/2016   ALT 16 07/13/2016   AST 15 07/13/2016   NA 139 07/13/2016   K 3.7 07/13/2016   CL 107 07/13/2016   CREATININE 0.90 07/13/2016   BUN 20  07/13/2016   CO2 25 07/13/2016   TSH 3.04 07/13/2016   HGBA1C 6.1 05/14/2013    Dg Hip Complete Right  Result Date: 08/04/2014 CLINICAL DATA:  RIGHT hip in the pain for 6 months. No recent injury. EXAM: RIGHT HIP - COMPLETE 2+ VIEW COMPARISON:  None. FINDINGS: There is no evidence of hip fracture or dislocation. There is no evidence of arthropathy or other focal bone abnormality. Radiodense material overlies the pubic symphysis region, possibly postsurgical. IMPRESSION: No acute findings. Electronically Signed   By: Rolla Flatten M.D.   On: 08/04/2014 10:42   Dg Knee Complete 4 Views Right  Result Date: 08/04/2014 CLINICAL DATA:   Right hip and knee pain. EXAM: RIGHT KNEE - COMPLETE 4+ VIEW COMPARISON:  None. FINDINGS: No joint effusion or fracture. Small enthesophyte is seen off the superior margin of the patella. Minimal inferior patellofemoral osteophytosis. IMPRESSION: 1. No acute findings. 2. Minimal degenerative change in the patellofemoral compartment. Electronically Signed   By: Lorin Picket M.D.   On: 08/04/2014 10:51    Assessment & Plan:   Yarelie was seen today for cough.  Diagnoses and all orders for this visit:  Acute URI -     methylPREDNISolone acetate (DEPO-MEDROL) injection 40 mg; Inject 1 mL (40 mg total) into the muscle once. -     ipratropium (ATROVENT) 0.03 % nasal spray; Place 2 sprays into both nostrils 2 (two) times daily. Do not use for more than 5days. -     benzonatate (TESSALON) 100 MG capsule; Take 1 capsule (100 mg total) by mouth 3 (three) times daily as needed for cough. -     guaiFENesin-dextromethorphan (ROBITUSSIN DM) 100-10 MG/5ML syrup; Take 5 mLs by mouth every 4 (four) hours as needed for cough.  Acute cystitis with hematuria -     nitrofurantoin (MACRODANTIN) 100 MG capsule; Take 1 capsule (100 mg total) by mouth 2 (two) times daily. -     POCT Urinalysis Dipstick   I have changed Ms. Mach's nitrofurantoin. I am also having her start on ipratropium, benzonatate, and guaiFENesin-dextromethorphan. Additionally, I am having her maintain her aspirin, Multiple Vitamin, pantoprazole, nabumetone, and Estradiol. We administered methylPREDNISolone acetate.  Meds ordered this encounter  Medications  . methylPREDNISolone acetate (DEPO-MEDROL) injection 40 mg  . ipratropium (ATROVENT) 0.03 % nasal spray    Sig: Place 2 sprays into both nostrils 2 (two) times daily. Do not use for more than 5days.    Dispense:  30 mL    Refill:  12    Order Specific Question:   Supervising Provider    Answer:   Cassandria Anger [[id number]]  . benzonatate (TESSALON) 100 MG capsule    Sig: Take 1  capsule (100 mg total) by mouth 3 (three) times daily as needed for cough.    Dispense:  20 capsule    Refill:  0    Order Specific Question:   Supervising Provider    Answer:   Cassandria Anger [[id number]]  . guaiFENesin-dextromethorphan (ROBITUSSIN DM) 100-10 MG/5ML syrup    Sig: Take 5 mLs by mouth every 4 (four) hours as needed for cough.    Dispense:  118 mL    Refill:  0    Order Specific Question:   Supervising Provider    Answer:   Cassandria Anger [[id number]]  . nitrofurantoin (MACRODANTIN) 100 MG capsule    Sig: Take 1 capsule (100 mg total) by mouth 2 (two) times daily.    Dispense:  20 capsule  Refill:  0    Order Specific Question:   Supervising Provider    Answer:   Cassandria Anger [[id number]]    Follow-up: Return if symptoms worsen or fail to improve.  Wilfred Lacy, NP

## 2016-08-02 NOTE — Telephone Encounter (Signed)
Patient Name: [name]  DOB: 22-Jan-1951    Initial Comment Caller states wants appt; thinks has UTI-urgency and frequency; and after flu shot has gotten sick and thinks getting-coughing up green stuff from chest, nasal mucus is clear; thinks bronchitits;    Nurse Assessment  Nurse: Leilani Merl, RN, Heather Date/Time ([place] Time): 08/02/2016 12:33:31 PM  Confirm and document reason for call. If symptomatic, describe symptoms. You must click the next button to save text entered. ---Caller states wants appt; thinks has UTI-urgency and frequency that started this morning; and after flu shot has gotten sick and thinks getting-coughing up green stuff from chest, nasal mucus is clear; thinks bronchitis; started 3 days ago.  Has the patient traveled out of the country within the last 30 days? ---Not Applicable  Does the patient have any new or worsening symptoms? ---Yes  Will a triage be completed? ---Yes  Related visit to physician within the last 2 weeks? ---No  Does the PT have any chronic conditions? (i.e. diabetes, asthma, etc.) ---Yes  List chronic conditions. ---See MR  Is this a behavioral health or substance abuse call? ---No     Guidelines    Guideline Title Affirmed Question Affirmed Notes  Cough - Acute Non-Productive Wheezing is present    Final Disposition User   See Physician within 4 Hours (or PCP triage) Leilani Merl, RN, Heather    Referrals  REFERRED TO PCP OFFICE   Disagree/Comply: Comply

## 2016-08-04 ENCOUNTER — Other Ambulatory Visit: Payer: Self-pay | Admitting: *Deleted

## 2016-08-04 MED ORDER — PANTOPRAZOLE SODIUM 40 MG PO TBEC: 40.0000 mg | DELAYED_RELEASE_TABLET | Freq: Every day | ORAL | 3 refills | Status: DC

## 2016-10-27 ENCOUNTER — Encounter: Payer: Self-pay | Admitting: Obstetrics and Gynecology

## 2016-10-27 ENCOUNTER — Ambulatory Visit (INDEPENDENT_AMBULATORY_CARE_PROVIDER_SITE_OTHER): Payer: Federal, State, Local not specified - PPO | Admitting: Obstetrics and Gynecology

## 2016-10-27 VITALS — BP 116/74 | HR 76 | Ht 67.0 in | Wt 222.8 lb

## 2016-10-27 DIAGNOSIS — R8299 Other abnormal findings in urine: Secondary | ICD-10-CM | POA: Diagnosis not present

## 2016-10-27 DIAGNOSIS — R300 Dysuria: Secondary | ICD-10-CM | POA: Diagnosis not present

## 2016-10-27 DIAGNOSIS — R82998 Other abnormal findings in urine: Secondary | ICD-10-CM

## 2016-10-27 LAB — POCT URINALYSIS DIPSTICK
BILIRUBIN UA: NEGATIVE
GLUCOSE UA: NEGATIVE
Ketones, UA: NEGATIVE
Nitrite, UA: NEGATIVE
PH UA: 5
Protein, UA: NEGATIVE
UROBILINOGEN UA: NEGATIVE

## 2016-10-27 MED ORDER — SULFAMETHOXAZOLE-TRIMETHOPRIM 800-160 MG PO TABS: 1.0000 | ORAL_TABLET | Freq: Two times a day (BID) | ORAL | 0 refills | Status: DC

## 2016-10-27 MED ORDER — PHENAZOPYRIDINE HCL 100 MG PO TABS: 100.0000 mg | ORAL_TABLET | Freq: Three times a day (TID) | ORAL | 0 refills | Status: DC | PRN

## 2016-10-27 NOTE — Progress Notes (Signed)
GYNECOLOGY  VISIT   HPI: 65 y.o.   Married  Caucasian  female   G1P1001 with No LMP recorded. Patient has had a hysterectomy.   here for dysuria, urinary frequency and burning for one week.  Felt hot and sweaty yesterday.  No back pain.  No nausea or vomiting.  No blood in the urine.  Cannot get to the bathroom on time.  Takes Nitrofurantoin daily 100 for several years.  Skipped a couple of doses. Has been taking it through her PCP.  Last UTI was earlier this year.   Urine Dip: 2+WBCs, Trace RBCs.  GYNECOLOGIC HISTORY: No LMP recorded. Patient has had a hysterectomy. Contraception: Hysterectomy Menopausal hormone therapy:  none Last mammogram:  05-11-16 3D/Density B/Neg/BiRads1:[facility] Last pap smear:  06-21-16 Neg         OB History    Gravida Para Term Preterm AB Living   _0 SAB TAB Ectopic Multiple Live Births                     Patient Active Problem List   Diagnosis Date Noted  . Abnormal TSH 07/21/2015  . URI (upper respiratory infection) 04/17/2015  . Greater trochanteric bursitis of right hip 08/04/2014  . Localized osteoarthrosis, lower leg 08/04/2014  . Biliary colic 81/19/1478  . Cholelithiasis 08/07/2013  . Left otitis media 12/07/2012  . Hyperlipidemia 02/13/2012  . Impaired glucose tolerance 02/13/2012  . Preventative health care 02/11/2012  . Diverticulosis 02/11/2012  . Degenerative arthritis of hip 02/11/2012  . Cervical disc disease 02/11/2012  . Cervical cancer ([place])   . Allergic rhinitis, cause unspecified   . Chronic LBP   . Obesity   . Blood in stool 08/31/2010  . HEMORRHOIDS-INTERNAL 07/21/2010  . CONSTIPATION 07/19/2010  . ARTHRITIS, KNEE 02/10/2009  . VAGINITIS, ATROPHIC 11/11/2008  . EXOGENOUS OBESITY 07/29/2008  . GERD 08/10/2007  . OVERACTIVE BLADDER 08/10/2007    Past Medical History:  Diagnosis Date  . Abnormal Pap smear of vagina 06/17/15   LGSIL  . Allergic rhinitis, cause unspecified   . Arthritis   .  ARTHRITIS, KNEE 02/10/2009   Qualifier: Diagnosis of  By: Niel Hummer MD, Lorinda Creed   . BRCA negative 03/25/13  . Cervical cancer ([place])   . Cervical disc disease 02/11/2012  . Chronic LBP   . Degenerative arthritis of hip 02/11/2012  . Diverticulosis 02/11/2012  . GERD 08/10/2007   Qualifier: Diagnosis of  By: Sherlynn Stalls, CMA, [place]    . GERD (gastroesophageal reflux disease)   . GERD (gastroesophageal reflux disease)   . HEMORRHOIDS-INTERNAL 07/21/2010   Qualifier: Diagnosis of  By: Chester Holstein NP, Nevin Bloodgood    . Hiatal hernia   . Hyperlipidemia 02/13/2012  . Mitral valve prolapse   . OAB (overactive bladder)   . Obesity   . OVERACTIVE BLADDER 08/10/2007   Qualifier: Diagnosis of  By: Sherlynn Stalls, CMA, [place]    . Pneumonia   . PONV (postoperative nausea and vomiting)   . Recurrent UTI   . SUI (stress urinary incontinence, female)   . VAGINITIS, ATROPHIC 11/11/2008   Qualifier: Diagnosis of  By: Niel Hummer MD, Lorinda Creed     Past Surgical History:  Procedure Laterality Date  . BUNIONECTOMY  2007  . CERVICAL DISCECTOMY  2002  . CHOLECYSTECTOMY N/A 08/13/2013   Procedure: LAPAROSCOPIC CHOLECYSTECTOMY WITH INTRAOPERATIVE CHOLANGIOGRAM;  Surgeon: Shann Medal, MD;  Location: [facility];  Service: General;  Laterality: N/A;  .  COLONOSCOPY    . conization of cervix  1982   with D&C  . CYSTOCELE REPAIR  2003   rectocele repair  . EYE SURGERY Bilateral    lasik  . SHOULDER ARTHROSCOPY  2004   frozen shoulder  . TONSILLECTOMY    . VAGINAL HYSTERECTOMY  1986    Current Outpatient Prescriptions  Medication Sig Dispense Refill  . aspirin 81 MG tablet Take 81 mg by mouth as needed.     . benzonatate (TESSALON) 100 MG capsule Take 1 capsule (100 mg total) by mouth 3 (three) times daily as needed for cough. 20 capsule 0  . Estradiol 10 MCG TABS vaginal tablet Place 1 tablet (10 mcg total) vaginally 2 (two) times a week. 8 tablet 10  . guaiFENesin-dextromethorphan (ROBITUSSIN DM) 100-10 MG/5ML syrup Take 5 mLs by  mouth every 4 (four) hours as needed for cough. 118 mL 0  . ipratropium (ATROVENT) 0.03 % nasal spray Place 2 sprays into both nostrils 2 (two) times daily. Do not use for more than 5days. 30 mL 12  . MULTIPLE VITAMIN tablet Take 1 tablet by mouth daily.    . nabumetone (RELAFEN) 750 MG tablet Take one tablet by mouth twice daily as needed  for arthritis 60 tablet 5  . nitrofurantoin (MACRODANTIN) 100 MG capsule Take 1 capsule (100 mg total) by mouth 2 (two) times daily. 20 capsule 0  . pantoprazole (PROTONIX) 40 MG tablet Take 1 tablet (40 mg total) by mouth daily. 90 tablet 3  . phenazopyridine (PYRIDIUM) 100 MG tablet Take 1 tablet (100 mg total) by mouth 3 (three) times daily as needed for pain. 10 tablet 0  . sulfamethoxazole-trimethoprim (BACTRIM DS) 800-160 MG tablet Take 1 tablet by mouth 2 (two) times daily. Take for one week. 14 tablet 0   No current facility-administered medications for this visit.      ALLERGIES: Amoxicillin and Penicillins  Family History  Problem Relation Age of Onset  . Breast cancer Mother   . Heart disease Sister   . Breast cancer Sister   . Diabetes Brother   . Heart disease Brother     triple bypass  . Diabetes Maternal Aunt   . Diabetes Maternal Uncle   . Cancer Maternal Grandmother     uterine    Social History   Social History  . Marital status: Married    Spouse name: N/A  . Number of children: N/A  . Years of education: N/A   Occupational History  . Not on file.   Social History Main Topics  . Smoking status: Never Smoker  . Smokeless tobacco: Never Used  . Alcohol use No  . Drug use: No  . Sexual activity: Yes    Partners: Male    Birth control/ protection: Post-menopausal     Comment: hysterectomy   Other Topics Concern  . Not on file   Social History Narrative  . No narrative on file    ROS:  Pertinent items are noted in HPI.  PHYSICAL EXAMINATION:    BP 116/74 (BP Location: Right Arm, Patient Position: Sitting,  Cuff Size: Large)   Pulse 76   Ht _0  (1.702 m)   Wt 222 lb 12.8 oz (101.1 kg)   BMI 34.90 kg/m     General appearance: alert, cooperative and appears stated age   Chaperone was present for exam.  ASSESSMENT  UTI.  On Nitrofurantoin for UTI suppression.  PLAN  Urine culture.  Stop Nitrofurantoin. Start Bactrim DS  po bid for 7 days.  Pyridium 100 mg po tid for 2 days.  Hydrate well.  Call for fever, nausea and vomiting, back pain, or any other concern.    An After Visit Summary was printed and given to the patient.  _15_____ minutes face to face time of which over 50% was spent in counseling.

## 2016-10-27 NOTE — Patient Instructions (Signed)

## 2016-10-28 LAB — URINALYSIS, MICROSCOPIC ONLY
Casts: NONE SEEN [LPF]
Crystals: NONE SEEN [HPF]
Yeast: NONE SEEN [HPF]

## 2016-10-29 LAB — URINE CULTURE: Colony Count: >=100000

## 2016-11-07 HISTORY — PX: COLONOSCOPY: SHX174

## 2017-01-20 DIAGNOSIS — D1801 Hemangioma of skin and subcutaneous tissue: Secondary | ICD-10-CM | POA: Diagnosis not present

## 2017-01-20 DIAGNOSIS — L738 Other specified follicular disorders: Secondary | ICD-10-CM | POA: Diagnosis not present

## 2017-01-20 DIAGNOSIS — D225 Melanocytic nevi of trunk: Secondary | ICD-10-CM | POA: Diagnosis not present

## 2017-01-20 DIAGNOSIS — L814 Other melanin hyperpigmentation: Secondary | ICD-10-CM | POA: Diagnosis not present

## 2017-04-10 ENCOUNTER — Emergency Department (HOSPITAL_BASED_OUTPATIENT_CLINIC_OR_DEPARTMENT_OTHER)
Admission: EM | Admit: 2017-04-10 | Discharge: 2017-04-10 | Disposition: A | Discharge status: Home / Self Care | Payer: Federal, State, Local not specified - PPO | Attending: Emergency Medicine | Admitting: Emergency Medicine

## 2017-04-10 ENCOUNTER — Emergency Department (HOSPITAL_BASED_OUTPATIENT_CLINIC_OR_DEPARTMENT_OTHER): Payer: Federal, State, Local not specified - PPO

## 2017-04-10 ENCOUNTER — Encounter (HOSPITAL_BASED_OUTPATIENT_CLINIC_OR_DEPARTMENT_OTHER): Payer: Self-pay | Admitting: Emergency Medicine

## 2017-04-10 DIAGNOSIS — N23 Unspecified renal colic: Secondary | ICD-10-CM

## 2017-04-10 DIAGNOSIS — R109 Unspecified abdominal pain: Secondary | ICD-10-CM | POA: Diagnosis not present

## 2017-04-10 DIAGNOSIS — Z7982 Long term (current) use of aspirin: Secondary | ICD-10-CM | POA: Diagnosis not present

## 2017-04-10 DIAGNOSIS — M545 Low back pain: Secondary | ICD-10-CM | POA: Diagnosis not present

## 2017-04-10 LAB — COMPREHENSIVE METABOLIC PANEL
ALBUMIN: 3.9 g/dL (ref 3.5–5.0)
ALK PHOS: 86 U/L (ref 38–126)
ALT: 21 U/L (ref 14–54)
AST: 25 U/L (ref 15–41)
Anion gap: 10 (ref 5–15)
BILIRUBIN TOTAL: 1 mg/dL (ref 0.3–1.2)
BUN: 22 mg/dL — AB (ref 6–20)
CALCIUM: 9.2 mg/dL (ref 8.9–10.3)
CO2: 22 mmol/L (ref 22–32)
CREATININE: 1.02 mg/dL — AB (ref 0.44–1.00)
Chloride: 106 mmol/L (ref 101–111)
GFR calc Af Amer: >60 mL/min (ref >60)
GFR calc non Af Amer: 57 mL/min — ABNORMAL LOW (ref >60)
GLUCOSE: 126 mg/dL — AB (ref 65–99)
Potassium: 3.2 mmol/L — ABNORMAL LOW (ref 3.5–5.1)
Sodium: 138 mmol/L (ref 135–145)
TOTAL PROTEIN: 7.3 g/dL (ref 6.5–8.1)

## 2017-04-10 LAB — URINALYSIS, ROUTINE W REFLEX MICROSCOPIC
GLUCOSE, UA: NEGATIVE mg/dL
Ketones, ur: NEGATIVE mg/dL
Nitrite: NEGATIVE
PH: 5 (ref 5.0–8.0)
Protein, ur: NEGATIVE mg/dL
SPECIFIC GRAVITY, URINE: 1.028 (ref 1.005–1.030)

## 2017-04-10 LAB — CBC WITH DIFFERENTIAL/PLATELET
BASOS ABS: 0 10*3/uL (ref 0.0–0.1)
Basophils Relative: 0 %
EOS PCT: 2 %
Eosinophils Absolute: 0.3 10*3/uL (ref 0.0–0.7)
HCT: 40 % (ref 36.0–46.0)
HEMOGLOBIN: 13.6 g/dL (ref 12.0–15.0)
LYMPHS PCT: 27 %
Lymphs Abs: 3 10*3/uL (ref 0.7–4.0)
MCH: 29.9 pg (ref 26.0–34.0)
MCHC: 34 g/dL (ref 30.0–36.0)
MCV: 87.9 fL (ref 78.0–100.0)
Monocytes Absolute: 0.7 10*3/uL (ref 0.1–1.0)
Monocytes Relative: 6 %
NEUTROS ABS: 7.2 10*3/uL (ref 1.7–7.7)
NEUTROS PCT: 65 %
PLATELETS: 240 10*3/uL (ref 150–400)
RBC: 4.55 MIL/uL (ref 3.87–5.11)
RDW: 12.7 % (ref 11.5–15.5)
WBC: 11.1 10*3/uL — AB (ref 4.0–10.5)

## 2017-04-10 LAB — URINALYSIS, MICROSCOPIC (REFLEX)

## 2017-04-10 MED ORDER — POTASSIUM CHLORIDE CRYS ER 20 MEQ PO TBCR
40.0000 meq | EXTENDED_RELEASE_TABLET | Freq: Once | ORAL | Status: AC
  Administered 2017-04-10: 40 meq via ORAL
  Filled 2017-04-10: qty 2

## 2017-04-10 NOTE — ED Triage Notes (Signed)
Patient states that she has had right sided flank pain for about 2 hours

## 2017-04-10 NOTE — ED Provider Notes (Signed)
[facility] DEPT [facility] Provider Note   CSN: [id number] Arrival date & time: 04/10/17  1953  By signing my name below, I, Jeanell Sparrow, attest that this documentation has been prepared under the direction and in the presence of Sherwood Gambler, MD. Electronically Signed: Jeanell Sparrow, Scribe. 04/10/2017. 8:28 PM.  History   Chief Complaint Chief Complaint  Patient presents with  . Flank Pain   The history is provided by the patient. No language interpreter was used.   HPI Comments: [name] is a 66 y.o. female who presents to the Emergency Department complaining of intermittent moderate right-sided flank pain that started about 3 hours ago. She states she suspected a UTI yesterday with her onset of associated urgency. No hx of flank pain with UTI episodes. She describes the pain as waxing/waning. She reports associated nausea and right lower back pain. Denies any hx of kidney stones, fever, dysuria, hematuria, vomiting, or other complaints at this time. No prior flank pain with multiple prior UTIs  Past Medical History:  Diagnosis Date  . Abnormal Pap smear of vagina 06/17/15   LGSIL  . Allergic rhinitis, cause unspecified   . Arthritis   . ARTHRITIS, KNEE 02/10/2009   Qualifier: Diagnosis of  By: Niel Hummer MD, Lorinda Creed   . BRCA negative 03/25/13  . Cervical cancer ([place])   . Cervical disc disease 02/11/2012  . Chronic LBP   . Degenerative arthritis of hip 02/11/2012  . Diverticulosis 02/11/2012  . GERD 08/10/2007   Qualifier: Diagnosis of  By: Sherlynn Stalls, CMA, [place]    . GERD (gastroesophageal reflux disease)   . GERD (gastroesophageal reflux disease)   . HEMORRHOIDS-INTERNAL 07/21/2010   Qualifier: Diagnosis of  By: Chester Holstein NP, Nevin Bloodgood    . Hiatal hernia   . Hyperlipidemia 02/13/2012  . Mitral valve prolapse   . OAB (overactive bladder)   . Obesity   . OVERACTIVE BLADDER 08/10/2007   Qualifier: Diagnosis of  By: Sherlynn Stalls, CMA, [place]    . Pneumonia   . PONV (postoperative nausea and  vomiting)   . Recurrent UTI   . SUI (stress urinary incontinence, female)   . VAGINITIS, ATROPHIC 11/11/2008   Qualifier: Diagnosis of  By: Niel Hummer MD, Lorinda Creed     Patient Active Problem List   Diagnosis Date Noted  . Abnormal TSH 07/21/2015  . URI (upper respiratory infection) 04/17/2015  . Greater trochanteric bursitis of right hip 08/04/2014  . Localized osteoarthrosis, lower leg 08/04/2014  . Biliary colic 39/76/7341  . Cholelithiasis 08/07/2013  . Left otitis media 12/07/2012  . Hyperlipidemia 02/13/2012  . Impaired glucose tolerance 02/13/2012  . Preventative health care 02/11/2012  . Diverticulosis 02/11/2012  . Degenerative arthritis of hip 02/11/2012  . Cervical disc disease 02/11/2012  . Cervical cancer ([place])   . Allergic rhinitis, cause unspecified   . Chronic LBP   . Obesity   . Blood in stool 08/31/2010  . HEMORRHOIDS-INTERNAL 07/21/2010  . CONSTIPATION 07/19/2010  . ARTHRITIS, KNEE 02/10/2009  . VAGINITIS, ATROPHIC 11/11/2008  . EXOGENOUS OBESITY 07/29/2008  . GERD 08/10/2007  . OVERACTIVE BLADDER 08/10/2007    Past Surgical History:  Procedure Laterality Date  . BUNIONECTOMY  2007  . CERVICAL DISCECTOMY  2002  . CHOLECYSTECTOMY N/A 08/13/2013   Procedure: LAPAROSCOPIC CHOLECYSTECTOMY WITH INTRAOPERATIVE CHOLANGIOGRAM;  Surgeon: Shann Medal, MD;  Location: [facility];  Service: General;  Laterality: N/A;  . COLONOSCOPY    . conization of cervix  1982   with D&C  .  CYSTOCELE REPAIR  2003   rectocele repair  . EYE SURGERY Bilateral    lasik  . SHOULDER ARTHROSCOPY  2004   frozen shoulder  . TONSILLECTOMY    . VAGINAL HYSTERECTOMY  1986    OB History    Gravida Para Term Preterm AB Living   _0 SAB TAB Ectopic Multiple Live Births                   Home Medications    Prior to Admission medications   Medication Sig Start Date End Date Taking? Authorizing Provider  aspirin 81 MG tablet Take 81 mg by mouth as needed.     [provider]  benzonatate (TESSALON) 100 MG capsule Take 1 capsule (100 mg total) by mouth 3 (three) times daily as needed for cough. 08/02/16   Nche, Charlene Brooke, NP  Estradiol 10 MCG TABS vaginal tablet Place 1 tablet (10 mcg total) vaginally 2 (two) times a week. 06/23/16   Regina Eck, CNM  guaiFENesin-dextromethorphan (ROBITUSSIN DM) 100-10 MG/5ML syrup Take 5 mLs by mouth every 4 (four) hours as needed for cough. 08/02/16   Nche, Charlene Brooke, NP  ipratropium (ATROVENT) 0.03 % nasal spray Place 2 sprays into both nostrils 2 (two) times daily. Do not use for more than 5days. 08/02/16   Nche, Charlene Brooke, NP  MULTIPLE VITAMIN tablet Take 1 tablet by mouth daily.    [provider]  nabumetone (RELAFEN) 750 MG tablet Take one tablet by mouth twice daily as needed  for arthritis 09/25/15   Biagio Borg, MD  nitrofurantoin (MACRODANTIN) 100 MG capsule Take 1 capsule (100 mg total) by mouth 2 (two) times daily. 08/02/16   Nche, Charlene Brooke, NP  pantoprazole (PROTONIX) 40 MG tablet Take 1 tablet (40 mg total) by mouth daily. 08/04/16   Biagio Borg, MD  phenazopyridine (PYRIDIUM) 100 MG tablet Take 1 tablet (100 mg total) by mouth 3 (three) times daily as needed for pain. 10/27/16   Nunzio Cobbs, MD  sulfamethoxazole-trimethoprim (BACTRIM DS) 800-160 MG tablet Take 1 tablet by mouth 2 (two) times daily. Take for one week. 10/27/16   Nunzio Cobbs, MD    Family History Family History  Problem Relation Age of Onset  . Breast cancer Mother   . Heart disease Sister   . Breast cancer Sister   . Diabetes Brother   . Heart disease Brother        triple bypass  . Diabetes Maternal Aunt   . Diabetes Maternal Uncle   . Cancer Maternal Grandmother        uterine    Social History Social History  Substance Use Topics  . Smoking status: Never Smoker  . Smokeless tobacco: Never Used  . Alcohol use No     Allergies   Amoxicillin and  Penicillins   Review of Systems Review of Systems  Constitutional: Negative for fever.  Gastrointestinal: Positive for nausea. Negative for vomiting.  Genitourinary: Positive for flank pain (RIght) and urgency. Negative for dysuria and hematuria.  Musculoskeletal: Positive for back pain (Right lower).  All other systems reviewed and are negative.    Physical Exam Updated Vital Signs BP 134/66 (BP Location: Right Arm)   Pulse 87   Temp 97.9 F (36.6 C) (Oral)   Resp 18   Ht _1  (1.702 m)   Wt 237 lb (107.5 kg)  SpO2 100%   BMI 37.12 kg/m   Physical Exam  Constitutional: She is oriented to person, place, and time. She appears well-developed and well-nourished.  HENT:  Head: Normocephalic and atraumatic.  Right Ear: External ear normal.  Left Ear: External ear normal.  Nose: Nose normal.  Eyes: Right eye exhibits no discharge. Left eye exhibits no discharge.  Cardiovascular: Normal rate, regular rhythm and normal heart sounds.   Pulmonary/Chest: Effort normal and breath sounds normal.  Abdominal: Soft. There is no tenderness.  Musculoskeletal:  No CVA or thoracic/lumbar tenderness.   Neurological: She is alert and oriented to person, place, and time.  Skin: Skin is warm and dry.  Nursing note and vitals reviewed.    ED Treatments / Results  DIAGNOSTIC STUDIES: Oxygen Saturation is 100% on RA, normal by my interpretation.    COORDINATION OF CARE: 8:32 PM- Pt advised of plan for treatment and pt agrees.  Labs (all labs ordered are listed, but only abnormal results are displayed) Labs Reviewed  URINALYSIS, ROUTINE W REFLEX MICROSCOPIC - Abnormal; Notable for the following:       Result Value   Color, Urine AMBER (*)    APPearance CLOUDY (*)    Hgb urine dipstick MODERATE (*)    Bilirubin Urine SMALL (*)    Leukocytes, UA SMALL (*)    All other components within normal limits  URINALYSIS, MICROSCOPIC (REFLEX) - Abnormal; Notable for the following:     Bacteria, UA FEW (*)    Squamous Epithelial / LPF 6-30 (*)    All other components within normal limits  CBC WITH DIFFERENTIAL/PLATELET - Abnormal; Notable for the following:    WBC 11.1 (*)    All other components within normal limits  COMPREHENSIVE METABOLIC PANEL - Abnormal; Notable for the following:    Potassium 3.2 (*)    Glucose, Bld 126 (*)    BUN 22 (*)    Creatinine, Ser 1.02 (*)    GFR calc non Af Amer 57 (*)    All other components within normal limits    EKG  EKG Interpretation None       Radiology No results found.  Procedures Procedures (including critical care time) EMERGENCY DEPARTMENT ULTRASOUND  Study: Limited Retroperitoneal Ultrasound of the Abdominal Aorta.  INDICATIONS:Abdominal pain, Back pain and Age>55 Multiple views of the abdominal aorta were obtained in real-time from the diaphragmatic hiatus to the aortic bifurcation in transverse planes with a multi-frequency probe.  PERFORMED BY: Myself IMAGES ARCHIVED?: Yes LIMITATIONS:  Body habitus and Bowel gas INTERPRETATION:  No abdominal aortic aneurysm     Medications Ordered in ED Medications  potassium chloride SA (K-DUR,KLOR-CON) CR tablet 40 mEq (40 mEq Oral Given 04/10/17 2121)     Initial Impression / Assessment and Plan / ED Course  I have reviewed the triage vital signs and the nursing notes.  Pertinent labs & imaging results that were available during my care of the patient were reviewed by me and considered in my medical decision making (see chart for details).  Clinical Course as of Apr 10 2345  Mon Apr 10, 2017  2033 Will get CT to eval for ureteral stone given sudden onset of flank pain. No current pain/nausea, denies needing meds at this moment.  [SG]  2159 Delay due to CT. U/s probably not appropriate due to RLQ symptoms as appendicitis also needs to be ruled out. Wants to wait.  [SG]    Clinical Course User Index [SG] Sherwood Gambler, MD  Patient's pain and urinary  symptoms resolved just prior to arrival. She has been in the ED for several hours now and has had no recurrence of pain. Her abdominal and acting exam are unremarkable. My suspicion for appendicitis is quite low. Given this is a first time likely renal colic, I wanted to get a CT scan, especially with her age. However her CT scanner has broken and while they thought they could fix it is now broken for the rest of the night. I discussed multiple options with patient including bedside ultrasound to rule out AAA along with either transfer to another hospital for CT or observation at home given that she has been pain-free for several hours. She prefers to go home at this point. I think this is reasonable as I think ureteral colic with likely passed stone is the most likely etiology. No aneurysm or dilatation seen on my ultrasound. Given history, other acute emergent pathology is unlikely. Her urinary symptoms have resolved and I think that her trouble urinating was related to the stone. There are small leukocytes in her urine but UTI seems less likely. Strict return precautions as well as need for close follow-up with PCP.  Final Clinical Impressions(s) / ED Diagnoses   Final diagnoses:  Ureteral colic  Right flank pain    New Prescriptions Discharge Medication List as of 04/10/2017 11:16 PM     I personally performed the services described in this documentation, which was scribed in my presence. The recorded information has been reviewed and is accurate.     Sherwood Gambler, MD 04/10/17 [phone]

## 2017-05-11 ENCOUNTER — Other Ambulatory Visit: Payer: Self-pay

## 2017-05-11 MED ORDER — PANTOPRAZOLE SODIUM 40 MG PO TBEC: 40.0000 mg | DELAYED_RELEASE_TABLET | Freq: Every day | ORAL | 2 refills | Status: DC

## 2017-05-12 DIAGNOSIS — Z803 Family history of malignant neoplasm of breast: Secondary | ICD-10-CM | POA: Diagnosis not present

## 2017-05-12 DIAGNOSIS — Z1231 Encounter for screening mammogram for malignant neoplasm of breast: Secondary | ICD-10-CM | POA: Diagnosis not present

## 2017-05-12 LAB — HM MAMMOGRAPHY

## 2017-05-15 ENCOUNTER — Telehealth: Payer: Self-pay

## 2017-05-15 NOTE — Telephone Encounter (Signed)
Pt states that rx for pantoprazole requires a PA.   Confirmed insurance information that we have on file is correct. (scan date of insurance card is 06/17/2015)

## 2017-05-16 NOTE — Telephone Encounter (Signed)
Approved.  

## 2017-05-16 NOTE — Telephone Encounter (Signed)
04/16/17-05/16/18 

## 2017-05-16 NOTE — Telephone Encounter (Signed)
Key: [id number]

## 2017-05-30 ENCOUNTER — Encounter: Payer: Self-pay | Admitting: Obstetrics & Gynecology

## 2017-06-23 ENCOUNTER — Ambulatory Visit: Payer: Federal, State, Local not specified - PPO | Admitting: Certified Nurse Midwife

## 2017-07-05 ENCOUNTER — Other Ambulatory Visit: Payer: Self-pay | Admitting: Certified Nurse Midwife

## 2017-07-05 ENCOUNTER — Ambulatory Visit (INDEPENDENT_AMBULATORY_CARE_PROVIDER_SITE_OTHER): Payer: Federal, State, Local not specified - PPO | Admitting: Certified Nurse Midwife

## 2017-07-05 ENCOUNTER — Other Ambulatory Visit (HOSPITAL_COMMUNITY)
Admission: RE | Admit: 2017-07-05 | Discharge: 2017-07-05 | Disposition: A | Discharge status: Home / Self Care | Payer: Federal, State, Local not specified - PPO | Source: Ambulatory Visit | Attending: Obstetrics & Gynecology | Admitting: Obstetrics & Gynecology

## 2017-07-05 ENCOUNTER — Telehealth: Payer: Self-pay | Admitting: Gastroenterology

## 2017-07-05 ENCOUNTER — Other Ambulatory Visit: Payer: Self-pay | Admitting: Internal Medicine

## 2017-07-05 ENCOUNTER — Telehealth: Payer: Self-pay | Admitting: Internal Medicine

## 2017-07-05 ENCOUNTER — Encounter: Payer: Self-pay | Admitting: Certified Nurse Midwife

## 2017-07-05 VITALS — BP 120/76 | HR 70 | Resp 16 | Ht 66.75 in | Wt 232.0 lb

## 2017-07-05 DIAGNOSIS — Z124 Encounter for screening for malignant neoplasm of cervix: Secondary | ICD-10-CM | POA: Diagnosis not present

## 2017-07-05 DIAGNOSIS — Z87898 Personal history of other specified conditions: Secondary | ICD-10-CM | POA: Diagnosis not present

## 2017-07-05 DIAGNOSIS — K625 Hemorrhage of anus and rectum: Secondary | ICD-10-CM | POA: Diagnosis not present

## 2017-07-05 DIAGNOSIS — Z01419 Encounter for gynecological examination (general) (routine) without abnormal findings: Secondary | ICD-10-CM

## 2017-07-05 DIAGNOSIS — Z8742 Personal history of other diseases of the female genital tract: Secondary | ICD-10-CM

## 2017-07-05 DIAGNOSIS — Z0000 Encounter for general adult medical examination without abnormal findings: Secondary | ICD-10-CM

## 2017-07-05 DIAGNOSIS — N952 Postmenopausal atrophic vaginitis: Secondary | ICD-10-CM

## 2017-07-05 NOTE — Telephone Encounter (Signed)
Pt called to schedule her physical with Dr Jenny Reichmann. She said that she normally has labs done prior to her visit. Can these be entered for her?

## 2017-07-05 NOTE — Telephone Encounter (Signed)
Can have APP opening one day this week, in the pm is ok

## 2017-07-05 NOTE — Telephone Encounter (Signed)
LM informing pt.

## 2017-07-05 NOTE — Telephone Encounter (Signed)
Done

## 2017-07-05 NOTE — Patient Instructions (Signed)

## 2017-07-05 NOTE — Progress Notes (Signed)
66 y.o. G16P1001 Married  Caucasian Fe here for annual exam. Menopausal no HRT. Patient noted ?vaginal bleeding twice in last year, may have been rectally bleeding, never saw it from vagina,  Just on pad.. Had last occurrence last pm. Last sexual activity was one week ago. Vaginal dryness with previous Estrace cream use. History of LSIL vaginally. Also noted rectal bleeding for about 4-5 months, history of hemorrhoids. Has been taking stool softener as needed. Denies black tarry stools or blood in stool. No abdominal pain or other issues. Sees PCP yearly for labs and aex. No other health issues today.  No LMP recorded. Patient has had a hysterectomy.          Sexually active: Yes.    The current method of family planning is status post hysterectomy.    Exercising: No.  exercise Smoker:  no  Health Maintenance: Pap:  12-29-15 LGSIL, 06-21-16 neg History of Abnormal Pap: yes MMG:  05-12-17 category b density birads 2:neg Self Breast exams: no Colonoscopy:  2011 f/u 20yr BMD:   2017 normal due 2019 TDaP:  2015 Shingles: no  Pneumonia: had done Hep C and HIV: Hep c neg 2017 Labs: none   reports that she has never smoked. She has never used smokeless tobacco. She reports that she does not drink alcohol or use drugs.  Past Medical History:  Diagnosis Date  . Abnormal Pap smear of vagina 06/17/15   LGSIL  . Allergic rhinitis, cause unspecified   . Arthritis   . ARTHRITIS, KNEE 02/10/2009   Qualifier: Diagnosis of  By: SNiel HummerMD, WLorinda Creed  . BRCA negative 03/25/13  . Cervical cancer ([place]   . Cervical disc disease 02/11/2012  . Chronic LBP   . Degenerative arthritis of hip 02/11/2012  . Diverticulosis 02/11/2012  . GERD 08/10/2007   Qualifier: Diagnosis of  By: WSherlynn Stalls CMA, [place]   . GERD (gastroesophageal reflux disease)   . GERD (gastroesophageal reflux disease)   . HEMORRHOIDS-INTERNAL 07/21/2010   Qualifier: Diagnosis of  By: GChester HolsteinNP, PNevin Bloodgood   . Hiatal hernia   . Hyperlipidemia  02/13/2012  . Mitral valve prolapse   . OAB (overactive bladder)   . Obesity   . OVERACTIVE BLADDER 08/10/2007   Qualifier: Diagnosis of  By: WSherlynn Stalls CMA, [place]   . Pneumonia   . PONV (postoperative nausea and vomiting)   . Recurrent UTI   . SUI (stress urinary incontinence, female)   . VAGINITIS, ATROPHIC 11/11/2008   Qualifier: Diagnosis of  By: SNiel HummerMD, WLorinda Creed    Past Surgical History:  Procedure Laterality Date  . BUNIONECTOMY  2007  . CERVICAL DISCECTOMY  2002  . CHOLECYSTECTOMY N/A 08/13/2013   Procedure: LAPAROSCOPIC CHOLECYSTECTOMY WITH INTRAOPERATIVE CHOLANGIOGRAM;  Surgeon: DShann Medal MD;  Location: [facility]  Service: General;  Laterality: N/A;  . COLONOSCOPY    . conization of cervix  1982   with D&C  . CYSTOCELE REPAIR  2003   rectocele repair  . EYE SURGERY Bilateral    lasik  . SHOULDER ARTHROSCOPY  2004   frozen shoulder  . TONSILLECTOMY    . VAGINAL HYSTERECTOMY  1986    Current Outpatient Prescriptions  Medication Sig Dispense Refill  . aspirin 81 MG tablet Take 81 mg by mouth as needed.     . Estradiol 10 MCG TABS vaginal tablet Place 1 tablet (10 mcg total) vaginally 2 (two) times a week. 8 tablet 10  . MULTIPLE  VITAMIN tablet Take 1 tablet by mouth daily.    . nabumetone (RELAFEN) 750 MG tablet Take one tablet by mouth twice daily as needed  for arthritis 60 tablet 5  . nitrofurantoin (MACRODANTIN) 100 MG capsule Take 1 capsule (100 mg total) by mouth 2 (two) times daily. (Patient taking differently: Take 100 mg by mouth daily. ) 20 capsule 0  . pantoprazole (PROTONIX) 40 MG tablet Take 1 tablet (40 mg total) by mouth daily. 30 tablet 2   No current facility-administered medications for this visit.     Family History  Problem Relation Age of Onset  . Breast cancer Mother   . Heart disease Sister   . Breast cancer Sister   . Diabetes Brother   . Heart disease Brother        triple bypass  . Diabetes Maternal Aunt   . Diabetes Maternal  Uncle   . Cancer Maternal Grandmother        uterine    ROS:  Pertinent items are noted in HPI.  Otherwise, a comprehensive ROS was negative.  Exam:   BP 120/76   Pulse 70   Resp 16   Ht 5' 6.75" (1.695 m)   Wt 232 lb (105.2 kg)   BMI 36.61 kg/m  Height: 5' 6.75" (169.5 cm) Ht Readings from Last 3 Encounters:  07/05/17 5' 6.75" (1.695 m)  04/10/17 _0  (1.702 m)  10/27/16 _1  (1.702 m)    General appearance: alert, cooperative and appears stated age Head: Normocephalic, without obvious abnormality, atraumatic Neck: no adenopathy, supple, symmetrical, trachea midline and thyroid normal to inspection and palpation Lungs: clear to auscultation bilaterally Breasts: normal appearance, no masses or tenderness, No nipple retraction or dimpling, No nipple discharge or bleeding, No axillary or supraclavicular adenopathy Heart: regular rate and rhythm Abdomen: soft, non-tender; no masses,  no organomegaly Extremities: extremities normal, atraumatic, no cyanosis or edema Skin: Skin color, texture, turgor normal. No rashes or lesions Lymph nodes: Cervical, supraclavicular, and axillary nodes normal. No abnormal inguinal nodes palpated Neurologic: Grossly normal   Pelvic: External genitalia:  no lesions              Urethra:  normal appearing urethra with no masses, tenderness or lesions              Bartholin's and Skene's: normal                 Vagina: atrophic appearing vagina with pale color and scant  discharge, no lesions,  No evidence of bleeding from vagina. Pap smear taken              Cervix: absent              Pap taken: Yes.   Bimanual Exam:  Uterus:  uterus absent              Adnexa: normal adnexa and no mass, fullness, tenderness               Rectovaginal: Confirms               Anus:  normal sphincter tone, hemorrhoids noted and rectal bleeding noted with analscope, small amount of ?unusal tissue noted in rectum, non tender  Chaperone present: yes  A:  Well  Woman with normal exam  Menopausal  No HRT S/P TVH for  Bleeding and rectocele/cystocele repair after uterine prolapse  History of LSIL vaginal follow up pap today  Atrophic vaginitis with OTC coconut oil  use and vagifem use  Rectal bleeding for 4-5 months with hemorrhoids, needs follow up     P:   Reviewed health and wellness pertinent to exam  Discussed will await pap results to see if further evaluation needed  Discussed will hold vagifem until pap back, agreeable.  Discussed need for follow up now with hemorrhoids. Patient will have referral to GI before leaving office. Warning signs of rectal bleeding given.  Pap smear: yes   counseled on breast self exam, mammography screening, adequate intake of calcium and vitamin D, diet and exercise  return annually or prn  An After Visit Summary was printed and given to the patient.

## 2017-07-05 NOTE — Telephone Encounter (Signed)
Medication refill request: Yuvafem  Last AEX:  07-05-17  Next AEX: 07-06-18  Last MMG (if hormonal medication request): 05-12-17 WNL  Refill authorized: please advise

## 2017-07-05 NOTE — Progress Notes (Signed)
Spoke with [facility]. First available appointment is 07/20/2017. Requested message be sent to triage for return call for earlier scheduling.  Spoke with Lorriane Shire at [facility]. Appointment scheduled for 07/06/2017 at 1:30 pm for patient to see Ellouise Newer, PA. Patient is agreeable to date and time.

## 2017-07-05 NOTE — Telephone Encounter (Signed)
Patient has been scheduled for 07/06/17 at 1:45pm with [facility] appointment was scheduled with Fara Chute from [facility].

## 2017-07-06 ENCOUNTER — Ambulatory Visit (INDEPENDENT_AMBULATORY_CARE_PROVIDER_SITE_OTHER): Payer: Federal, State, Local not specified - PPO | Admitting: Physician Assistant

## 2017-07-06 ENCOUNTER — Encounter: Payer: Self-pay | Admitting: Physician Assistant

## 2017-07-06 VITALS — BP 130/70 | HR 76 | Ht 66.75 in | Wt 232.0 lb

## 2017-07-06 DIAGNOSIS — K629 Disease of anus and rectum, unspecified: Secondary | ICD-10-CM

## 2017-07-06 DIAGNOSIS — K6289 Other specified diseases of anus and rectum: Secondary | ICD-10-CM

## 2017-07-06 DIAGNOSIS — K625 Hemorrhage of anus and rectum: Secondary | ICD-10-CM

## 2017-07-06 MED ORDER — NA SULFATE-K SULFATE-MG SULF 17.5-3.13-1.6 GM/177ML PO SOLN: 1.0000 | ORAL | 0 refills | Status: DC

## 2017-07-06 NOTE — Patient Instructions (Signed)

## 2017-07-06 NOTE — Progress Notes (Signed)
Thank you for sending this case to me and for discussing it in clinic today. I have reviewed the entire note, and the outlined plan is what we discussed.   Wilfrid Lund, MD

## 2017-07-06 NOTE — Progress Notes (Signed)
Chief Complaint: Rectal Bleeding  HPI:  [name] is a 66 y/o Caucasian female, known to Dr. Fuller Plan, with a past medical history as listed below including abnormal Pap smear showing LGSIL (low grade squamous intraepithelial lesion),  who was referred to me by  Melvia Heaps, CNM for rectal bleeding and abnormal anoscopy.   Patient was last seen in our clinic on 10/07/10 for a colonoscopy with Dr. Fuller Plan due to hematochezia. Patient had findings of mild diverticulosis in the sigmoid colon and internal hemorrhoids which underwent injection sclerosis at that time. Patient was told to repeat in 10 years.    Per review of chart patient saw her OB/GYN yesterday. Rectal exam was described as this: "Anus:  normal sphincter tone, hemorrhoids noted and rectal bleeding noted with analscope, small amount of ?unusal tissue noted in rectum, non tender". Patient was then sent to our clinic.   Today, the patient presents to clinic and explains that she has had some rectal bleeding almost daily over the past 4-5 months. Patient tells me that initially she noticed this as some bright red blood on the pad that she wears every day due to some urinary incontinence. She thought that this was possibly coming from her vagina. Patient also describes that she would see bright red blood mixed in with her bowel movements, and sometimes clots of blood over the past 4-5 months. She has also noticed a decrease in caliber of her stool. She does note occasionally it appears more "squished". She also has some days of just loose diarrhea which is abnormal for her. Patient tells me that she did recently see her OB/GYN who did an anoscopy as described above and told her to see our clinic.   Patient denies family history of colorectal cancer, fever, chills, weight loss, anorexia, nausea, vomiting, heartburn, reflux, abdominal pain, rectal bleeding or symptoms that awaken her at night.  Past Medical History:  Diagnosis Date  . Abnormal Pap  smear of vagina 06/17/15   LGSIL  . Allergic rhinitis, cause unspecified   . Arthritis   . ARTHRITIS, KNEE 02/10/2009   Qualifier: Diagnosis of  By: Niel Hummer MD, Lorinda Creed   . BRCA negative 03/25/13  . Cervical cancer ([place])   . Cervical disc disease 02/11/2012  . Chronic LBP   . Degenerative arthritis of hip 02/11/2012  . Diverticulosis 02/11/2012  . GERD 08/10/2007   Qualifier: Diagnosis of  By: Sherlynn Stalls, CMA, [place]    . GERD (gastroesophageal reflux disease)   . GERD (gastroesophageal reflux disease)   . HEMORRHOIDS-INTERNAL 07/21/2010   Qualifier: Diagnosis of  By: Chester Holstein NP, Nevin Bloodgood    . Hiatal hernia   . Hyperlipidemia 02/13/2012  . Mitral valve prolapse   . OAB (overactive bladder)   . Obesity   . OVERACTIVE BLADDER 08/10/2007   Qualifier: Diagnosis of  By: Sherlynn Stalls, CMA, [place]    . Pneumonia   . PONV (postoperative nausea and vomiting)   . Recurrent UTI   . SUI (stress urinary incontinence, female)   . VAGINITIS, ATROPHIC 11/11/2008   Qualifier: Diagnosis of  By: Niel Hummer MD, Lorinda Creed     Past Surgical History:  Procedure Laterality Date  . BUNIONECTOMY  2007  . CERVICAL DISCECTOMY  2002  . CHOLECYSTECTOMY N/A 08/13/2013   Procedure: LAPAROSCOPIC CHOLECYSTECTOMY WITH INTRAOPERATIVE CHOLANGIOGRAM;  Surgeon: Shann Medal, MD;  Location: [facility];  Service: General;  Laterality: N/A;  . COLONOSCOPY    . conization of cervix  1982  with D&C  . CYSTOCELE REPAIR  2003   rectocele repair  . EYE SURGERY Bilateral    lasik  . SHOULDER ARTHROSCOPY  2004   frozen shoulder  . TONSILLECTOMY    . VAGINAL HYSTERECTOMY  1986    Current Outpatient Prescriptions  Medication Sig Dispense Refill  . aspirin 81 MG tablet Take 81 mg by mouth as needed.     . Estradiol 10 MCG TABS vaginal tablet Place 1 tablet (10 mcg total) vaginally 2 (two) times a week. 8 tablet 10  . MULTIPLE VITAMIN tablet Take 1 tablet by mouth daily.    . nabumetone (RELAFEN) 750 MG tablet TAKE 1 TABLET BY MOUTH TWICE A  DAY AS NEEDED FOR ARTHRITIS 60 tablet 0  . nitrofurantoin (MACRODANTIN) 100 MG capsule Take 1 capsule (100 mg total) by mouth 2 (two) times daily. (Patient taking differently: Take 100 mg by mouth daily. ) 20 capsule 0  . pantoprazole (PROTONIX) 40 MG tablet Take 1 tablet (40 mg total) by mouth daily. 30 tablet 2   No current facility-administered medications for this visit.     Allergies as of 07/06/2017 - Review Complete 07/06/2017  Allergen Reaction Noted  . Amoxicillin  01/18/2007  . Penicillins Other (See Comments)     Family History  Problem Relation Age of Onset  . Breast cancer Mother        dx in her last 94's  . Heart disease Sister   . Breast cancer Sister        dx in her late 51's  . Diabetes Brother   . Heart disease Brother        triple bypass  . Diabetes Maternal Aunt   . Diabetes Maternal Uncle   . Cancer Maternal Grandmother        uterine  . Colon cancer Neg Hx   . Rectal cancer Neg Hx   . Esophageal cancer Neg Hx     Social History   Social History  . Marital status: Married    Spouse name: N/A  . Number of children: 1  . Years of education: N/A   Occupational History  .  [employer]   Social History Main Topics  . Smoking status: Never Smoker  . Smokeless tobacco: Never Used  . Alcohol use No  . Drug use: No  . Sexual activity: Yes    Partners: Male    Birth control/ protection: Post-menopausal     Comment: hysterectomy   Other Topics Concern  . Not on file   Social History Narrative  . No narrative on file    Review of Systems:    Constitutional: No weight loss, fever or chills Skin: No rash or itching Cardiovascular: No chest pain Respiratory: No SOB  Gastrointestinal: See HPI and otherwise negative Genitourinary: No dysuria Neurological: No headache, dizziness or syncope Musculoskeletal: No new muscle or joint pain Hematologic: No bruising Psychiatric: No history of depression or anxiety   Physical Exam:  Vital  signs: BP 130/70   Pulse 76   Ht 5' 6.75" (1.695 m)   Wt 232 lb (105.2 kg)   BMI 36.61 kg/m   Constitutional:   Pleasant overweight Caucasian female appears to be in NAD, Well developed, Well nourished, alert and cooperative Head:  Normocephalic and atraumatic. Eyes:   PEERL, EOMI. No icterus. Conjunctiva pink. Ears:  Normal auditory acuity. Neck:  Supple Throat: Oral cavity and pharynx without inflammation, swelling or lesion.  Respiratory: Respirations even and unlabored. Lungs clear  to auscultation bilaterally.   No wheezes, crackles, or rhonchi.  Cardiovascular: Normal S1, S2. No MRG. Regular rate and rhythm. No peripheral edema, cyanosis or pallor.  Gastrointestinal:  Soft, nondistended, nontender. No rebound or guarding. Normal bowel sounds. No appreciable masses or hepatomegaly. Rectal:  External exam: Multiple external hemorrhoids and tags, abnormal soft cauliflower appearing mucosa with stigmata of recent bleeding; Anoscopy: 2-3 cm of abnormal cauliflower tissue continues to be visualized into the anal verge, inflamed internal hemorrhoids Msk:  Symmetrical without gross deformities. Without edema, no deformity or joint abnormality.  Neurologic:  Alert and  oriented x4;  grossly normal neurologically.  Skin:   Dry and intact without significant lesions or rashes. Psychiatric: Demonstrates good judgement and reason without abnormal affect or behaviors.  MOST RECENT LABS AND IMAGING: CBC    Component Value Date/Time   WBC 11.1 (H) 04/10/2017 2016   RBC 4.55 04/10/2017 2016   HGB 13.6 04/10/2017 2016   HCT 40.0 04/10/2017 2016   PLT 240 04/10/2017 2016   MCV 87.9 04/10/2017 2016   MCH 29.9 04/10/2017 2016   MCHC 34.0 04/10/2017 2016   RDW 12.7 04/10/2017 2016   LYMPHSABS 3.0 04/10/2017 2016   MONOABS 0.7 04/10/2017 2016   EOSABS 0.3 04/10/2017 2016   BASOSABS 0.0 04/10/2017 2016    CMP     Component Value Date/Time   NA 138 04/10/2017 2016   K 3.2 (L) 04/10/2017  2016   CL 106 04/10/2017 2016   CO2 22 04/10/2017 2016   GLUCOSE 126 (H) 04/10/2017 2016   BUN 22 (H) 04/10/2017 2016   CREATININE 1.02 (H) 04/10/2017 2016   CALCIUM 9.2 04/10/2017 2016   PROT 7.3 04/10/2017 2016   ALBUMIN 3.9 04/10/2017 2016   AST 25 04/10/2017 2016   ALT 21 04/10/2017 2016   ALKPHOS 86 04/10/2017 2016   BILITOT 1.0 04/10/2017 2016   GFRNONAA 57 (L) 04/10/2017 2016   GFRAA >60 04/10/2017 2016    Assessment: 1. Rectal bleeding: Daily for the past 4-5 months, sometimes blood clots in stool, others just bright red blood on sanitary pad, abnormal anoscopy today as above; Concern for rectal malignancy 2. Abnormal rectal mucosa: Seen at time of anoscopy today and also at time of anoscopy via OB/GYN yesterday, cauliflower-appearing tissue with stigmata of recent bleeding, history of rectal bleeding as above; concern for rectal malignancy  Plan: 1. Discussed case with Dr. Fuller Plan at time of patient's appointment, he does not have any available time for a colonoscopy in the next 2-3 weeks. Then discussed case with Dr. Loletha Carrow who agreed to see patient on Tuesday, 07/11/17 for colonoscopy. 2. Scheduled patient for a colonoscopy in the [place] with Dr. Loletha Carrow on 07/11/17. Did discuss risks, benefits, limitations and alternatives and patient agrees to proceed. 3. Patient to follow in clinic per recommendations after colonoscopy as above.  Ellouise Newer, PA-C [facility] 07/06/2017, 1:57 PM  Cc: Biagio Borg, MD

## 2017-07-06 NOTE — Progress Notes (Signed)
Reviewed and agree with management plan.  Quron Ruddy T. Dajanee Voorheis, MD FACG 

## 2017-07-07 LAB — CYTOLOGY - PAP: Diagnosis: NEGATIVE

## 2017-07-07 NOTE — Telephone Encounter (Signed)
On hold until pap smear back

## 2017-07-11 ENCOUNTER — Encounter: Payer: Self-pay | Admitting: Gastroenterology

## 2017-07-11 ENCOUNTER — Telehealth: Payer: Self-pay | Admitting: Gastroenterology

## 2017-07-11 ENCOUNTER — Ambulatory Visit (AMBULATORY_SURGERY_CENTER): Payer: Federal, State, Local not specified - PPO | Admitting: Gastroenterology

## 2017-07-11 VITALS — BP 121/77 | HR 63 | Temp 97.1°F | Resp 15 | Ht 66.0 in | Wt 232.0 lb

## 2017-07-11 DIAGNOSIS — K642 Third degree hemorrhoids: Secondary | ICD-10-CM

## 2017-07-11 DIAGNOSIS — K625 Hemorrhage of anus and rectum: Secondary | ICD-10-CM

## 2017-07-11 MED ORDER — SODIUM CHLORIDE 0.9 % IV SOLN: 500.0000 mL | INTRAVENOUS | Status: DC

## 2017-07-11 NOTE — Telephone Encounter (Signed)
Referral faxed to [facility]. Left message for patient to call back to discuss th eprocess

## 2017-07-11 NOTE — Telephone Encounter (Signed)
[name],    This is a patient of Dr. Lynne Leader that I did a colonoscopy on today.  Please place a referral to Dr. Leighton Ruff at [facility] for: symptomatic grade 3 internal hemorrhoids. Routine.

## 2017-07-11 NOTE — Op Note (Signed)
[name] Procedure Date: 07/11/2017 8:58 AM MRN: [id number] Endoscopist: Mallie Mussel L. Loletha Carrow , MD Age: 66 Referring MD:  Date of Birth: 1950-12-30 Gender: Female Account #: [id number] Procedure:                Colonoscopy Indications:              Rectal bleeding, Anal lesion on exam Medicines:                Monitored Anesthesia Care Procedure:                Pre-Anesthesia Assessment:                           - Prior to the procedure, a History and Physical                            was performed, and patient medications and                            allergies were reviewed. The patient's tolerance of                            previous anesthesia was also reviewed. The risks                            and benefits of the procedure and the sedation                            options and risks were discussed with the patient.                            All questions were answered, and informed consent                            was obtained. Anticoagulants: The patient has taken                            aspirin. It was decided not to withhold this                            medication prior to the procedure. ASA Grade                            Assessment: II - A patient with mild systemic                            disease. After reviewing the risks and benefits,                            the patient was deemed in satisfactory condition to                            undergo the procedure.  After obtaining informed consent, the colonoscope                            was passed under direct vision. Throughout the                            procedure, the patient's blood pressure, pulse, and                            oxygen saturations were monitored continuously. The                            Colonoscope was introduced through the anus and                            advanced to the the cecum, identified by               appendiceal orifice and ileocecal valve. The                            colonoscopy was performed without difficulty. The                            patient tolerated the procedure well. The quality                            of the bowel preparation was excellent. The                            ileocecal valve, appendiceal orifice, and rectum                            were photographed. The quality of the bowel                            preparation was evaluated using the BBPS [facility]                            Bowel Preparation Scale) with scores of: Right                            Colon = 3, Transverse Colon = 3 and Left Colon = 3                            (entire mucosa seen well with no residual staining,                            small fragments of stool or opaque liquid). The                            total BBPS score equals 9. The bowel preparation  used was SUPREP. Scope In: 9:11:48 AM Scope Out: 9:27:28 AM Scope Withdrawal Time: 0 hours 11 minutes 27 seconds  Total Procedure Duration: 0 hours 15 minutes 40 seconds  Findings:                 The perianal exam findings include a non-thrombosed                            RP internal hemorrhoid (Grade III).                           Internal hemorrhoids were found. The hemorrhoids                            were large and Grade III (internal hemorrhoids that                            prolapse but require manual reduction).                           The exam was otherwise without abnormality on                            direct and retroflexion views. Complications:            No immediate complications. Estimated Blood Loss:     Estimated blood loss: none. Impression:               - Non-thrombosed internal hemorrhoids and internal                            hemorrhoids that prolapse with straining, but                            require manual replacement into the anal canal                             (Grade III) found on perianal exam.                           - The examination was otherwise normal on direct                            and retroflexion views.                           - No specimens collected. Recommendation:           - Patient has a contact number available for                            emergencies. The signs and symptoms of potential                            delayed complications were discussed with the  patient. Return to normal activities tomorrow.                            Written discharge instructions were provided to the                            patient.                           - Resume previous diet.                           - Continue present medications.                           - Repeat colonoscopy in 10 years with Dr. Fuller Plan for                            screening purposes.                           - Refer to a colo-rectal surgeon at the next                            available appointment. As-needed use of preparation                            H cream. Calleen Alvis L. Loletha Carrow, MD 07/11/2017 9:39:08 AM This report has been signed electronically.

## 2017-07-11 NOTE — Patient Instructions (Signed)
Impression/Recommendations:  Hemorrhoid handout given to patient.  Resume previous diet. Continue present medications.  Repeat colonoscopy in 10 years with Dr. Fuller Plan for screening.  Refer to colo-rectal surgeon.  Use Preparation H cream as needed for hemmorhoid.  YOU HAD AN ENDOSCOPIC PROCEDURE TODAY AT [facility]:   Refer to the procedure report that was given to you for any specific questions about what was found during the examination.  If the procedure report does not answer your questions, please call your gastroenterologist to clarify.  If you requested that your care partner not be given the details of your procedure findings, then the procedure report has been included in a sealed envelope for you to review at your convenience later.  YOU SHOULD EXPECT: Some feelings of bloating in the abdomen. Passage of more gas than usual.  Walking can help get rid of the air that was put into your GI tract during the procedure and reduce the bloating. If you had a lower endoscopy (such as a colonoscopy or flexible sigmoidoscopy) you may notice spotting of blood in your stool or on the toilet paper. If you underwent a bowel prep for your procedure, you may not have a normal bowel movement for a few days.  Please Note:  You might notice some irritation and congestion in your nose or some drainage.  This is from the oxygen used during your procedure.  There is no need for concern and it should clear up in a day or so.  SYMPTOMS TO REPORT IMMEDIATELY:   Following lower endoscopy (colonoscopy or flexible sigmoidoscopy):  Excessive amounts of blood in the stool  Significant tenderness or worsening of abdominal pains  Swelling of the abdomen that is new, acute  Fever of 100F or higher For urgent or emergent issues, a gastroenterologist can be reached at any hour by calling [phone].   DIET:  We do recommend a small meal at first, but then you may proceed to your regular diet.   Drink plenty of fluids but you should avoid alcoholic beverages for 24 hours.  ACTIVITY:  You should plan to take it easy for the rest of today and you should NOT DRIVE or use heavy machinery until tomorrow (because of the sedation medicines used during the test).    FOLLOW UP: Our staff will call the number listed on your records the next business day following your procedure to check on you and address any questions or concerns that you may have regarding the information given to you following your procedure. If we do not reach you, we will leave a message.  However, if you are feeling well and you are not experiencing any problems, there is no need to return our call.  We will assume that you have returned to your regular daily activities without incident.  If any biopsies were taken you will be contacted by phone or by letter within the next 1-3 weeks.  Please call us at [phone] if you have not heard about the biopsies in 3 weeks.    SIGNATURES/CONFIDENTIALITY: You and/or your care partner have signed paperwork which will be entered into your electronic medical record.  These signatures attest to the fact that that the information above on your After Visit Summary has been reviewed and is understood.  Full responsibility of the confidentiality of this discharge information lies with you and/or your care-partner.

## 2017-07-11 NOTE — Telephone Encounter (Signed)
Patient notified that she will be contacted directly by [facility] with an appt toward the end of the week.

## 2017-07-11 NOTE — Progress Notes (Signed)
Report given to PACU, vss 

## 2017-07-12 ENCOUNTER — Telehealth: Payer: Self-pay

## 2017-07-12 NOTE — Telephone Encounter (Signed)
  Follow up Call-  Call back number 07/11/2017  Post procedure Call Back phone  # [phone]  Permission to leave phone message Yes  Some recent data might be hidden     Patient questions:  Do you have a fever, pain , or abdominal swelling? No. Pain Score  0 *  Have you tolerated food without any problems? Yes.    Have you been able to return to your normal activities? Yes.    Do you have any questions about your discharge instructions: Diet   No. Medications  No. Follow up visit  No.  Do you have questions or concerns about your Care? No.  Actions: * If pain score is 4 or above: No action needed, pain <4.

## 2017-07-17 ENCOUNTER — Telehealth: Payer: Self-pay | Admitting: Gastroenterology

## 2017-07-17 NOTE — Telephone Encounter (Signed)
Routed to [employer].

## 2017-07-17 NOTE — Telephone Encounter (Signed)
I spoke with [facility]. They have her referral and are working on getting her an appt.  I left a detailed message for the patient that she should be hearing from them in the next few days.

## 2017-07-19 NOTE — Telephone Encounter (Signed)
Patient has been scheduled for 08/07/17 at 10:30 with Dr. Marcello Moores at [place]

## 2017-07-21 ENCOUNTER — Other Ambulatory Visit (INDEPENDENT_AMBULATORY_CARE_PROVIDER_SITE_OTHER): Payer: Federal, State, Local not specified - PPO

## 2017-07-21 DIAGNOSIS — Z0000 Encounter for general adult medical examination without abnormal findings: Secondary | ICD-10-CM | POA: Diagnosis not present

## 2017-07-21 LAB — CBC WITH DIFFERENTIAL/PLATELET
BASOS ABS: 0.1 10*3/uL (ref 0.0–0.1)
BASOS PCT: 0.7 % (ref 0.0–3.0)
Eosinophils Absolute: 0.2 10*3/uL (ref 0.0–0.7)
Eosinophils Relative: 3.4 % (ref 0.0–5.0)
HEMATOCRIT: 39.3 % (ref 36.0–46.0)
Hemoglobin: 13.1 g/dL (ref 12.0–15.0)
Lymphocytes Relative: 45 % (ref 12.0–46.0)
Lymphs Abs: 3.3 10*3/uL (ref 0.7–4.0)
MCHC: 33.3 g/dL (ref 30.0–36.0)
MCV: 89.2 fl (ref 78.0–100.0)
MONOS PCT: 5.3 % (ref 3.0–12.0)
Monocytes Absolute: 0.4 10*3/uL (ref 0.1–1.0)
NEUTROS ABS: 3.3 10*3/uL (ref 1.4–7.7)
Neutrophils Relative %: 45.6 % (ref 43.0–77.0)
PLATELETS: 233 10*3/uL (ref 150.0–400.0)
RBC: 4.4 Mil/uL (ref 3.87–5.11)
RDW: 13.2 % (ref 11.5–15.5)
WBC: 7.3 10*3/uL (ref 4.0–10.5)

## 2017-07-21 LAB — URINALYSIS, ROUTINE W REFLEX MICROSCOPIC
Bilirubin Urine: NEGATIVE
Hgb urine dipstick: NEGATIVE
Ketones, ur: NEGATIVE
Leukocytes, UA: NEGATIVE
Nitrite: NEGATIVE
RBC / HPF: NONE SEEN (ref 0–?)
SPECIFIC GRAVITY, URINE: 1.025 (ref 1.000–1.030)
Total Protein, Urine: NEGATIVE
URINE GLUCOSE: NEGATIVE
Urobilinogen, UA: 0.2 (ref 0.0–1.0)
pH: 6 (ref 5.0–8.0)

## 2017-07-21 LAB — HEPATIC FUNCTION PANEL
ALBUMIN: 3.6 g/dL (ref 3.5–5.2)
ALT: 22 U/L (ref 0–35)
AST: 19 U/L (ref 0–37)
Alkaline Phosphatase: 77 U/L (ref 39–117)
Bilirubin, Direct: 0.2 mg/dL (ref 0.0–0.3)
TOTAL PROTEIN: 6.4 g/dL (ref 6.0–8.3)
Total Bilirubin: 0.9 mg/dL (ref 0.2–1.2)

## 2017-07-21 LAB — LIPID PANEL
CHOL/HDL RATIO: 3
Cholesterol: 160 mg/dL (ref 0–200)
HDL: 48.6 mg/dL (ref >39.00)
LDL CALC: 94 mg/dL (ref 0–99)
NonHDL: 111.65
Triglycerides: 87 mg/dL (ref 0.0–149.0)
VLDL: 17.4 mg/dL (ref 0.0–40.0)

## 2017-07-21 LAB — BASIC METABOLIC PANEL
BUN: 15 mg/dL (ref 6–23)
CHLORIDE: 108 meq/L (ref 96–112)
CO2: 25 meq/L (ref 19–32)
Calcium: 9 mg/dL (ref 8.4–10.5)
Creatinine, Ser: 0.9 mg/dL (ref 0.40–1.20)
GFR: 67.04 mL/min (ref >60.00)
Glucose, Bld: 96 mg/dL (ref 70–99)
Potassium: 3.6 mEq/L (ref 3.5–5.1)
SODIUM: 142 meq/L (ref 135–145)

## 2017-07-21 LAB — HEMOGLOBIN A1C: Hgb A1c MFr Bld: 5.7 % (ref 4.6–6.5)

## 2017-07-21 LAB — TSH: TSH: 2.53 u[IU]/mL (ref 0.35–4.50)

## 2017-07-28 ENCOUNTER — Encounter: Payer: Self-pay | Admitting: Internal Medicine

## 2017-07-28 ENCOUNTER — Ambulatory Visit (INDEPENDENT_AMBULATORY_CARE_PROVIDER_SITE_OTHER): Payer: Federal, State, Local not specified - PPO | Admitting: Internal Medicine

## 2017-07-28 VITALS — BP 130/84 | HR 70 | Temp 98.2°F | Ht 66.0 in | Wt 233.0 lb

## 2017-07-28 DIAGNOSIS — Z0000 Encounter for general adult medical examination without abnormal findings: Secondary | ICD-10-CM | POA: Diagnosis not present

## 2017-07-28 DIAGNOSIS — Z23 Encounter for immunization: Secondary | ICD-10-CM | POA: Diagnosis not present

## 2017-07-28 DIAGNOSIS — R7302 Impaired glucose tolerance (oral): Secondary | ICD-10-CM

## 2017-07-28 DIAGNOSIS — G4730 Sleep apnea, unspecified: Secondary | ICD-10-CM | POA: Diagnosis not present

## 2017-07-28 DIAGNOSIS — Z114 Encounter for screening for human immunodeficiency virus [HIV]: Secondary | ICD-10-CM

## 2017-07-28 DIAGNOSIS — M25551 Pain in right hip: Secondary | ICD-10-CM

## 2017-07-28 MED ORDER — ZOSTER VAC RECOMB ADJUVANTED 50 MCG/0.5ML IM SUSR: 0.5000 mL | Freq: Once | INTRAMUSCULAR | 1 refills | Status: AC

## 2017-07-28 NOTE — Patient Instructions (Addendum)
You had the flu shot today  Your shingles shot was sent to the pharmacy  You will be contacted regarding the referral for: pulmonary  Please continue all other medications as before, and refills have been done if requested.  Please have the pharmacy call with any other refills you may need.  Please continue your efforts at being more active, low cholesterol diet, and weight control.  You are otherwise up to date with prevention measures today.  Please keep your appointments with your specialists as you may have planned  Please return in 1 year for your yearly visit, or sooner if needed, with Lab testing done 3-5 days before

## 2017-07-28 NOTE — Progress Notes (Signed)
Subjective:    Patient ID: [name], female    DOB: [birth year], 66 y.o.   MRN: [id number]  HPI Here for wellness and f/u;  Overall doing ok;  Pt denies Chest pain, worsening SOB, DOE, wheezing, orthopnea, PND, worsening LE edema, palpitations, dizziness or syncope.  Pt denies neurological change such as new headache, facial or extremity weakness.  Pt denies polydipsia, polyuria, or low sugar symptoms. Pt states overall good compliance with treatment and medications, good tolerability, and has been trying to follow appropriate diet.  Pt denies worsening depressive symptoms, suicidal ideation or panic. No fever, night sweats, wt loss, loss of appetite, or other constitutional symptoms.  Pt states good ability with ADL's, has low fall risk, home safety reviewed and adequate, no other significant changes in hearing or vision, and not active with exercise due to recent worsening right groin pain with ambulation, thinks she may have arthritic hip.  To see General [facility] oct 1 for possible hemorrhoid surgury.  Seen per derm in June 2018 with ok exam.  Also with breathing issue at night with snoring and stopping. Past Medical History:  Diagnosis Date  . Abnormal Pap smear of vagina 06/17/15   LGSIL  . Allergic rhinitis, cause unspecified   . Arthritis   . ARTHRITIS, KNEE 02/10/2009   Qualifier: Diagnosis of  By: Niel Hummer MD, Lorinda Creed   . BRCA negative 03/25/13  . Cervical cancer ([facility]) 1984  . Cervical disc disease 02/11/2012  . Chronic LBP   . Degenerative arthritis of hip 02/11/2012  . Diverticulosis 02/11/2012  . GERD 08/10/2007   Qualifier: Diagnosis of  By: Sherlynn Stalls, CMA, [place]    . GERD (gastroesophageal reflux disease)   . GERD (gastroesophageal reflux disease)   . HEMORRHOIDS-INTERNAL 07/21/2010   Qualifier: Diagnosis of  By: Chester Holstein NP, Nevin Bloodgood    . Hiatal hernia   . Hyperlipidemia 02/13/2012  . Mitral valve prolapse   . OAB (overactive bladder)   . Obesity   . OVERACTIVE BLADDER 08/10/2007   Qualifier: Diagnosis of  By: Sherlynn Stalls, CMA, [place]    . Pneumonia   . PONV (postoperative nausea and vomiting)   . Recurrent UTI   . SUI (stress urinary incontinence, female)   . VAGINITIS, ATROPHIC 11/11/2008   Qualifier: Diagnosis of  By: Niel Hummer MD, Lorinda Creed    Past Surgical History:  Procedure Laterality Date  . BUNIONECTOMY  2007  . CERVICAL DISCECTOMY  2002  . CHOLECYSTECTOMY N/A 08/13/2013   Procedure: LAPAROSCOPIC CHOLECYSTECTOMY WITH INTRAOPERATIVE CHOLANGIOGRAM;  Surgeon: Shann Medal, MD;  Location: [facility];  Service: General;  Laterality: N/A;  . COLONOSCOPY    . conization of cervix  1982   with D&C  . CYSTOCELE REPAIR  2003   rectocele repair  . EYE SURGERY Bilateral    lasik  . SHOULDER ARTHROSCOPY  2004   frozen shoulder  . TONSILLECTOMY    . VAGINAL HYSTERECTOMY  1986    reports that she has never smoked. She has never used smokeless tobacco. She reports that she does not drink alcohol or use drugs. family history includes Breast cancer in her mother and sister; Cancer in her maternal grandmother; Diabetes in her brother, maternal aunt, and maternal uncle; Heart disease in her brother and sister. Allergies  Allergen Reactions  . Amoxicillin     REACTION: unspecified  . Penicillins Other (See Comments)    Unknown reaction   Current Outpatient Prescriptions on File Prior to Visit  Medication Sig  Dispense Refill  . aspirin 81 MG tablet Take 81 mg by mouth as needed.     . MULTIPLE VITAMIN tablet Take 1 tablet by mouth daily.    . nabumetone (RELAFEN) 750 MG tablet TAKE 1 TABLET BY MOUTH TWICE A DAY AS NEEDED FOR ARTHRITIS 60 tablet 0  . nitrofurantoin (MACRODANTIN) 100 MG capsule Take 1 capsule (100 mg total) by mouth 2 (two) times daily. (Patient taking differently: Take 100 mg by mouth daily. ) 20 capsule 0  . pantoprazole (PROTONIX) 40 MG tablet Take 1 tablet (40 mg total) by mouth daily. 30 tablet 2  . YUVAFEM 10 MCG TABS vaginal tablet PLACE 1 TABLET (10 MCG  TOTAL) VAGINALLY 2 (TWO) TIMES A WEEK. 8 tablet 12   Current Facility-Administered Medications on File Prior to Visit  Medication Dose Route Frequency Provider Last Rate Last Dose  . 0.9 %  sodium chloride infusion  500 mL Intravenous Continuous Danis, Kelly Cotta III, MD       Review of Systems Constitutional: Negative for other unusual diaphoresis, sweats, appetite or weight changes HENT: Negative for other worsening hearing loss, ear pain, facial swelling, mouth sores or neck stiffness.   Eyes: Negative for other worsening pain, redness or other visual disturbance.  Respiratory: Negative for other stridor or swelling Cardiovascular: Negative for other palpitations or other chest pain  Gastrointestinal: Negative for worsening diarrhea or loose stools, blood in stool, distention or other pain Genitourinary: Negative for hematuria, flank pain or other change in urine volume.  Musculoskeletal: Negative for myalgias or other joint swelling.  Skin: Negative for other color change, or other wound or worsening drainage.  Neurological: Negative for other syncope or numbness. Hematological: Negative for other adenopathy or swelling Psychiatric/Behavioral: Negative for hallucinations, other worsening agitation, SI, self-injury, or new decreased concentration All other system neg per pt    Objective:   Physical Exam BP 130/84   Pulse 70   Temp 98.2 F (36.8 C) (Oral)   Ht '5\' 6"'  (1.676 m)   Wt 233 lb (105.7 kg)   SpO2 96%   BMI 37.61 kg/m  VS noted,  Constitutional: Pt is oriented to person, place, and time. Appears well-developed and well-nourished, in no significant distress and comfortable Head: Normocephalic and atraumatic  Eyes: Conjunctivae and EOM are normal. Pupils are equal, round, and reactive to light Right Ear: External ear normal without discharge Left Ear: External ear normal without discharge Nose: Nose without discharge or deformity Mouth/Throat: Oropharynx is without other  ulcerations and moist  Neck: Normal range of motion. Neck supple. No JVD present. No tracheal deviation present or significant neck LA or mass Cardiovascular: Normal rate, regular rhythm, normal heart sounds and intact distal pulses.   Pulmonary/Chest: WOB normal and breath sounds without rales or wheezing  Abdominal: Soft. Bowel sounds are normal. NT. No HSM  Musculoskeletal: Normal range of motion except for right hip reduced flexion due to pain. Exhibits no edema Lymphadenopathy: Has no other cervical adenopathy.  Neurological: Pt is alert and oriented to person, place, and time. Pt has normal reflexes. No cranial nerve deficit. Motor grossly intact, Gait intact Skin: Skin is warm and dry. No rash noted or new ulcerations Psychiatric:  Has normal mood and affect. Behavior is normal without agitation No other exam findings Lab Results  Component Value Date   WBC 7.3 07/21/2017   HGB 13.1 07/21/2017   HCT 39.3 07/21/2017   PLT 233.0 07/21/2017   GLUCOSE 96 07/21/2017   CHOL 160 07/21/2017  TRIG 87.0 07/21/2017   HDL 48.60 07/21/2017   LDLDIRECT 135.1 05/08/2012   LDLCALC 94 07/21/2017   ALT 22 07/21/2017   AST 19 07/21/2017   NA 142 07/21/2017   K 3.6 07/21/2017   CL 108 07/21/2017   CREATININE 0.90 07/21/2017   BUN 15 07/21/2017   CO2 25 07/21/2017   TSH 2.53 07/21/2017   HGBA1C 5.7 07/21/2017      Assessment & Plan:

## 2017-07-30 DIAGNOSIS — G4733 Obstructive sleep apnea (adult) (pediatric): Secondary | ICD-10-CM | POA: Insufficient documentation

## 2017-07-30 DIAGNOSIS — Z9989 Dependence on other enabling machines and devices: Secondary | ICD-10-CM | POA: Insufficient documentation

## 2017-07-30 NOTE — Assessment & Plan Note (Signed)

## 2017-07-30 NOTE — Assessment & Plan Note (Signed)
Lab Results  Component Value Date   HGBA1C 5.7 07/21/2017  stable overall by history and exam, recent data reviewed with pt, and pt to continue medical treatment as before,  to f/u any worsening symptoms or concerns

## 2017-07-30 NOTE — Assessment & Plan Note (Signed)
For pulm referra, r/o osa

## 2017-07-30 NOTE — Assessment & Plan Note (Signed)
With pain c/w prob DJD, to consider sport med referral but declines for now

## 2017-08-04 ENCOUNTER — Telehealth: Payer: Self-pay | Admitting: Internal Medicine

## 2017-08-04 DIAGNOSIS — N3001 Acute cystitis with hematuria: Secondary | ICD-10-CM

## 2017-08-04 MED ORDER — NITROFURANTOIN MACROCRYSTAL 100 MG PO CAPS: 100.0000 mg | ORAL_CAPSULE | Freq: Two times a day (BID) | ORAL | 1 refills | Status: DC

## 2017-08-04 NOTE — Telephone Encounter (Signed)
Pt called in and needs refill on nitrofurantoin (MACRODANTIN) 100 MG capsule [[id number]]    Pharmacy on file

## 2017-08-04 NOTE — Telephone Encounter (Signed)
Notified pt MD ok refill has been sent to [facility].../lmb

## 2017-08-04 NOTE — Telephone Encounter (Signed)
Ok for refill x 1

## 2017-08-07 ENCOUNTER — Other Ambulatory Visit: Payer: Self-pay | Admitting: General Surgery

## 2017-08-07 DIAGNOSIS — K642 Third degree hemorrhoids: Secondary | ICD-10-CM | POA: Diagnosis not present

## 2017-08-07 NOTE — H&P (Signed)
History of Present Illness Leighton Ruff MD; 16/11/958 11:00 AM) The patient is a 66 year old female who presents with a complaint of Rectal bleeding. 66 year old female who presents to the office with rectal bleeding. She states that this occurs with loose bowel movements. She does report regular bowel habits but does have to strain to have bowel movements on occasion. She recently underwent a colonoscopy which was negative for any other signs of bleeding. She states that she did have her hemorrhoids injected with hypertonic saline in the past but has had no other surgical procedures. She denies any rectal pain.   Past Surgical History Malachy Moan, RMA; 08/07/2017 10:45 AM) Foot Surgery Right. Gallbladder Surgery - Laparoscopic Hysterectomy (not due to cancer) - Partial Shoulder Surgery Right. Spinal Surgery - Neck Tonsillectomy  Diagnostic Studies History Malachy Moan, [place]; 08/07/2017 10:45 AM) Colonoscopy within last year Mammogram within last year Pap Smear 1-5 years ago  Allergies Malachy Moan, RMA; 08/07/2017 10:46 AM) Amoxicillin *PENICILLINS* Penicillins  Medication History Malachy Moan, RMA; 08/07/2017 10:47 AM) Nabumetone (750MG  Tablet, Oral) Active. Nitrofurantoin Macrocrystal (100MG  Capsule, Oral) Active. Pantoprazole Sodium (40MG  Tablet DR, Oral) Active. Yuvafem (10MCG Tablet, Vaginal) Active. Multivitamins/Minerals (Oral) Active. Vitamin B12 (100MCG Tablet, Oral) Active. Vitamin D3 (2000UNIT Tablet, Oral) Active. Aspirin (81MG  Tablet, Oral) Active. Hydrocodone-Acetaminophen (Oral as needed) Specific strength unknown - Active. Medications Reconciled  Social History Malachy Moan, [place]; 08/07/2017 10:45 AM) Alcohol use Occasional alcohol use. Caffeine use Carbonated beverages. No drug use Tobacco use Never smoker.  Family History Malachy Moan, [place]; 08/07/2017 10:45 AM) Arthritis Mother, Sister. Breast  Cancer Family Members In General, Mother, Sister. Cancer Family Members In General. Cerebrovascular Accident Brother, Mother. Diabetes Mellitus Brother, Family Members In General. Heart Disease Brother, Family Members In General. Thyroid problems Family Members In General.  Pregnancy / Birth History Malachy Moan, [place]; 08/07/2017 10:45 AM) Age at menarche 24 years. Age of menopause <47 Contraceptive History Oral contraceptives. Gravida 1 Length (months) of breastfeeding 3-6 Maternal age 53-30 Para 1  Other Problems Malachy Moan, [place]; 08/07/2017 10:45 AM) Arthritis Bladder Problems Cervical Cancer Cholelithiasis Diverticulosis Gastric Ulcer Gastroesophageal Reflux Disease Heart murmur Hemorrhoids Kidney Stone     Review of Systems Malachy Moan RMA; 08/07/2017 10:45 AM) General Not Present- Appetite Loss, Chills, Fatigue, Fever, Night Sweats, Weight Gain and Weight Loss. Skin Not Present- Change in Wart/Mole, Dryness, Hives, Jaundice, New Lesions, Non-Healing Wounds, Rash and Ulcer. HEENT Present- Wears glasses/contact lenses. Not Present- Earache, Hearing Loss, Hoarseness, Nose Bleed, Oral Ulcers, Ringing in the Ears, Seasonal Allergies, Sinus Pain, Sore Throat, Visual Disturbances and Yellow Eyes. Respiratory Present- Snoring. Not Present- Bloody sputum, Chronic Cough, Difficulty Breathing and Wheezing. Breast Not Present- Breast Mass, Breast Pain, Nipple Discharge and Skin Changes. Cardiovascular Not Present- Chest Pain, Difficulty Breathing Lying Down, Leg Cramps, Palpitations, Rapid Heart Rate, Shortness of Breath and Swelling of Extremities. Gastrointestinal Present- Bloody Stool and Hemorrhoids. Not Present- Abdominal Pain, Bloating, Change in Bowel Habits, Chronic diarrhea, Constipation, Difficulty Swallowing, Excessive gas, Gets full quickly at meals, Indigestion, Nausea, Rectal Pain and Vomiting. Female Genitourinary Present- Urgency.  Not Present- Frequency, Nocturia, Painful Urination and Pelvic Pain. Musculoskeletal Present- Joint Stiffness. Not Present- Back Pain, Joint Pain, Muscle Pain, Muscle Weakness and Swelling of Extremities. Neurological Not Present- Decreased Memory, Fainting, Headaches, Numbness, Seizures, Tingling, Tremor, Trouble walking and Weakness. Psychiatric Not Present- Anxiety, Bipolar, Change in Sleep Pattern, Depression, Fearful and Frequent crying. Endocrine Present- Heat Intolerance. Not Present- Cold Intolerance, Excessive Hunger, Hair Changes, Hot flashes and  New Diabetes. Hematology Present- Easy Bruising. Not Present- Blood Thinners, Excessive bleeding, Gland problems, HIV and Persistent Infections.  Vitals Malachy Moan RMA; 08/07/2017 10:47 AM) 08/07/2017 10:47 AM Weight: 232.8 lb Height: 66.75in Body Surface Area: 2.15 m Body Mass Index: 36.73 kg/m  Temp.: 49F  Pulse: 65 (Regular)  BP: 116/84 (Sitting, Left Arm, Standard)      Physical Exam Leighton Ruff MD; 08/12/2693 11:07 AM)  General Mental Status-Alert. General Appearance-Not in acute distress. Build & Nutrition-Well nourished. Posture-Normal posture. Gait-Normal.  Head and Neck Head-normocephalic, atraumatic with no lesions or palpable masses. Trachea-midline.  Chest and Lung Exam Chest and lung exam reveals -on auscultation, normal breath sounds, no adventitious sounds and normal vocal resonance.  Cardiovascular Cardiovascular examination reveals -normal heart sounds, regular rate and rhythm with no murmurs and no digital clubbing, cyanosis, edema, increased warmth or tenderness.  Abdomen Inspection Inspection of the abdomen reveals - No Hernias. Palpation/Percussion Palpation and Percussion of the abdomen reveal - Soft, Non Tender, No Rigidity (guarding), No hepatosplenomegaly and No Palpable abdominal masses.  Neurologic Neurologic evaluation reveals -alert and oriented x 3  with no impairment of recent or remote memory, normal attention span and ability to concentrate, normal sensation and normal coordination.  Musculoskeletal Normal Exam - Bilateral-Upper Extremity Strength Normal and Lower Extremity Strength Normal.   Results Leighton Ruff MD; 85/02/6269 12:09 PM) Procedures  Name Value Date ANOSCOPY, DIAGNOSTIC (35009) [ Hemorrhoids ] Procedure Other: Procedure: Anoscopy Surgeon: Marcello Moores After the risks and benefits were explained, verbal consent was obtained for above procedure. A medical assistant chaperone was present thoroughout the entire procedure. Anesthesia: none Diagnosis: rectal bleeding Findings: grade 3 RP, grade 2 RA, Grade 1 LL  Performed: 08/07/2017 11:08 AM    Assessment & Plan Leighton Ruff MD; 38/11/8297 12:09 PM)  PROLAPSED INTERNAL HEMORRHOIDS, GRADE 3 (B71.6) Impression: 66 year old female who presents to the office with rectal bleeding. On exam today she has a grade 3 right posterior hemorrhoid and a grade 2 right anterior hemorrhoid. Left lateral hemorrhoid is grade 1. We discussed rubber band ligation here in the office as well as standard hemorrhoidectomy and [place]. She has decided to proceed with trans-hemorrhoidal dearterialization. We have discussed the typical recovery time as well as the risk and benefits of the procedure. Risks include things such as bleeding and recurrence.

## 2017-08-19 ENCOUNTER — Emergency Department (HOSPITAL_COMMUNITY): Payer: Federal, State, Local not specified - PPO

## 2017-08-19 ENCOUNTER — Observation Stay (HOSPITAL_COMMUNITY)
Admission: EM | Admit: 2017-08-19 | Discharge: 2017-08-22 | Disposition: A | Discharge status: Home / Self Care | Payer: Federal, State, Local not specified - PPO | Attending: Orthopedic Surgery | Admitting: Orthopedic Surgery

## 2017-08-19 ENCOUNTER — Encounter (HOSPITAL_COMMUNITY): Payer: Self-pay

## 2017-08-19 DIAGNOSIS — W109XXA Fall (on) (from) unspecified stairs and steps, initial encounter: Secondary | ICD-10-CM | POA: Insufficient documentation

## 2017-08-19 DIAGNOSIS — M79605 Pain in left leg: Secondary | ICD-10-CM | POA: Diagnosis not present

## 2017-08-19 DIAGNOSIS — S82851A Displaced trimalleolar fracture of right lower leg, initial encounter for closed fracture: Principal | ICD-10-CM | POA: Insufficient documentation

## 2017-08-19 DIAGNOSIS — Z7901 Long term (current) use of anticoagulants: Secondary | ICD-10-CM | POA: Insufficient documentation

## 2017-08-19 DIAGNOSIS — S82899A Other fracture of unspecified lower leg, initial encounter for closed fracture: Secondary | ICD-10-CM | POA: Diagnosis present

## 2017-08-19 DIAGNOSIS — Z09 Encounter for follow-up examination after completed treatment for conditions other than malignant neoplasm: Secondary | ICD-10-CM

## 2017-08-19 DIAGNOSIS — M25571 Pain in right ankle and joints of right foot: Secondary | ICD-10-CM | POA: Diagnosis not present

## 2017-08-19 DIAGNOSIS — M1990 Unspecified osteoarthritis, unspecified site: Secondary | ICD-10-CM | POA: Diagnosis not present

## 2017-08-19 DIAGNOSIS — E785 Hyperlipidemia, unspecified: Secondary | ICD-10-CM | POA: Insufficient documentation

## 2017-08-19 DIAGNOSIS — S92355A Nondisplaced fracture of fifth metatarsal bone, left foot, initial encounter for closed fracture: Secondary | ICD-10-CM

## 2017-08-19 DIAGNOSIS — S82853A Displaced trimalleolar fracture of unspecified lower leg, initial encounter for closed fracture: Secondary | ICD-10-CM | POA: Diagnosis present

## 2017-08-19 DIAGNOSIS — K219 Gastro-esophageal reflux disease without esophagitis: Secondary | ICD-10-CM | POA: Insufficient documentation

## 2017-08-19 DIAGNOSIS — Z79899 Other long term (current) drug therapy: Secondary | ICD-10-CM | POA: Diagnosis not present

## 2017-08-19 DIAGNOSIS — Z880 Allergy status to penicillin: Secondary | ICD-10-CM | POA: Diagnosis not present

## 2017-08-19 DIAGNOSIS — M79644 Pain in right finger(s): Secondary | ICD-10-CM

## 2017-08-19 DIAGNOSIS — S6991XA Unspecified injury of right wrist, hand and finger(s), initial encounter: Secondary | ICD-10-CM | POA: Diagnosis not present

## 2017-08-19 DIAGNOSIS — T148XXA Other injury of unspecified body region, initial encounter: Secondary | ICD-10-CM | POA: Diagnosis not present

## 2017-08-19 DIAGNOSIS — S99922A Unspecified injury of left foot, initial encounter: Secondary | ICD-10-CM | POA: Diagnosis not present

## 2017-08-19 DIAGNOSIS — S92352A Displaced fracture of fifth metatarsal bone, left foot, initial encounter for closed fracture: Secondary | ICD-10-CM | POA: Diagnosis not present

## 2017-08-19 LAB — BASIC METABOLIC PANEL
Anion gap: 8 (ref 5–15)
BUN: 22 mg/dL — AB (ref 6–20)
CHLORIDE: 108 mmol/L (ref 101–111)
CO2: 26 mmol/L (ref 22–32)
CREATININE: 0.96 mg/dL (ref 0.44–1.00)
Calcium: 9 mg/dL (ref 8.9–10.3)
GFR calc Af Amer: >60 mL/min (ref >60)
GFR calc non Af Amer: >60 mL/min (ref >60)
Glucose, Bld: 119 mg/dL — ABNORMAL HIGH (ref 65–99)
Potassium: 3.6 mmol/L (ref 3.5–5.1)
SODIUM: 142 mmol/L (ref 135–145)

## 2017-08-19 LAB — CBC WITH DIFFERENTIAL/PLATELET
Basophils Absolute: 0 10*3/uL (ref 0.0–0.1)
Basophils Relative: 0 %
EOS ABS: 0.2 10*3/uL (ref 0.0–0.7)
EOS PCT: 1 %
HCT: 38.2 % (ref 36.0–46.0)
Hemoglobin: 12.7 g/dL (ref 12.0–15.0)
LYMPHS ABS: 3 10*3/uL (ref 0.7–4.0)
Lymphocytes Relative: 23 %
MCH: 29.5 pg (ref 26.0–34.0)
MCHC: 33.2 g/dL (ref 30.0–36.0)
MCV: 88.6 fL (ref 78.0–100.0)
MONOS PCT: 5 %
Monocytes Absolute: 0.6 10*3/uL (ref 0.1–1.0)
Neutro Abs: 9.2 10*3/uL — ABNORMAL HIGH (ref 1.7–7.7)
Neutrophils Relative %: 71 %
Platelets: 231 10*3/uL (ref 150–400)
RBC: 4.31 MIL/uL (ref 3.87–5.11)
RDW: 12.7 % (ref 11.5–15.5)
WBC: 13 10*3/uL — AB (ref 4.0–10.5)

## 2017-08-19 MED ORDER — MORPHINE SULFATE (PF) 4 MG/ML IV SOLN
4.0000 mg | Freq: Once | INTRAVENOUS | Status: AC
  Administered 2017-08-19: 4 mg via INTRAVENOUS
  Filled 2017-08-19: qty 1

## 2017-08-19 MED ORDER — ONDANSETRON HCL 4 MG/2ML IJ SOLN
4.0000 mg | Freq: Once | INTRAMUSCULAR | Status: AC
  Administered 2017-08-19: 4 mg via INTRAVENOUS
  Filled 2017-08-19: qty 2

## 2017-08-19 NOTE — ED Provider Notes (Signed)
[facility] DEPT Provider Note   CSN: [id number] Arrival date & time: 08/19/17  2045     History   Chief Complaint Chief Complaint  Patient presents with  . Fall  . Ankle Injury    R    HPI [name] is a 66 y.o. female.  [name] is a 66 y.o. Female resents to the emergency department after a slip and fall complaining of right ankle pain,  Left foot pain, and pain to her right third finger. Patient reports she was walking down some steps and she missed a step and her right ankle twisted and she caught herself with her right hand. She reports pain to her right hand as well as pain to the dorsum of her left foot. She denies hitting her head or loss of consciousness. She denies any numbness tingling or weakness. She received fentanyl by EMS and reports her pain as a 10 out of 10 currently. She reports she is able to move her toes. She reports her tetanus shot was in the last couple years and is up-to-date. Last Tdap was 2015.  She denies head injury,  Shoulder pain, elbow pain, abdominal pain, chest pain, shortness of breath, numbness, tingling, weakness, hip pain, or knee pain.   The history is provided by the patient, medical records and a relative. No language interpreter was used.  Fall  Pertinent negatives include no chest pain, no abdominal pain, no headaches and no shortness of breath.  Ankle Injury  Pertinent negatives include no chest pain, no abdominal pain, no headaches and no shortness of breath.    Past Medical History:  Diagnosis Date  . Abnormal Pap smear of vagina 06/17/15   LGSIL  . Allergic rhinitis, cause unspecified   . Arthritis   . ARTHRITIS, KNEE 02/10/2009   Qualifier: Diagnosis of  By: Niel Hummer MD, Lorinda Creed   . BRCA negative 03/25/13  . Cervical cancer ([place]) 1984  . Cervical disc disease 02/11/2012  . Chronic LBP   . Degenerative arthritis of hip 02/11/2012  . Diverticulosis 02/11/2012  . GERD 08/10/2007   Qualifier: Diagnosis of  By: Sherlynn Stalls, CMA,  [place]    . GERD (gastroesophageal reflux disease)   . GERD (gastroesophageal reflux disease)   . HEMORRHOIDS-INTERNAL 07/21/2010   Qualifier: Diagnosis of  By: Chester Holstein NP, Nevin Bloodgood    . Hiatal hernia   . Hyperlipidemia 02/13/2012  . Mitral valve prolapse   . OAB (overactive bladder)   . Obesity   . OVERACTIVE BLADDER 08/10/2007   Qualifier: Diagnosis of  By: Sherlynn Stalls, CMA, [place]    . Pneumonia   . PONV (postoperative nausea and vomiting)   . Recurrent UTI   . SUI (stress urinary incontinence, female)   . VAGINITIS, ATROPHIC 11/11/2008   Qualifier: Diagnosis of  By: Niel Hummer MD, Lorinda Creed     Patient Active Problem List   Diagnosis Date Noted  . Trimalleolar fracture 08/20/2017  . Sleep-disordered breathing 07/30/2017  . Right hip pain 07/28/2017  . Abnormal TSH 07/21/2015  . URI (upper respiratory infection) 04/17/2015  . Greater trochanteric bursitis of right hip 08/04/2014  . Localized osteoarthrosis, lower leg 08/04/2014  . Biliary colic 49/17/9150  . Cholelithiasis 08/07/2013  . Left otitis media 12/07/2012  . Hyperlipidemia 02/13/2012  . Impaired glucose tolerance 02/13/2012  . Preventative health care 02/11/2012  . Diverticulosis 02/11/2012  . Degenerative arthritis of hip 02/11/2012  . Cervical disc disease 02/11/2012  . Cervical cancer ([place])   .  Allergic rhinitis, cause unspecified   . Chronic LBP   . Obesity   . Blood in stool 08/31/2010  . HEMORRHOIDS-INTERNAL 07/21/2010  . CONSTIPATION 07/19/2010  . ARTHRITIS, KNEE 02/10/2009  . VAGINITIS, ATROPHIC 11/11/2008  . EXOGENOUS OBESITY 07/29/2008  . GERD 08/10/2007  . OVERACTIVE BLADDER 08/10/2007    Past Surgical History:  Procedure Laterality Date  . BUNIONECTOMY  2007  . CERVICAL DISCECTOMY  2002  . CHOLECYSTECTOMY N/A 08/13/2013   Procedure: LAPAROSCOPIC CHOLECYSTECTOMY WITH INTRAOPERATIVE CHOLANGIOGRAM;  Surgeon: Shann Medal, MD;  Location: [facility];  Service: General;  Laterality: N/A;  . COLONOSCOPY    .  conization of cervix  1982   with D&C  . CYSTOCELE REPAIR  2003   rectocele repair  . EYE SURGERY Bilateral    lasik  . SHOULDER ARTHROSCOPY  2004   frozen shoulder  . TONSILLECTOMY    . VAGINAL HYSTERECTOMY  1986    OB History    Gravida Para Term Preterm AB Living   '1 1 1     1   ' SAB TAB Ectopic Multiple Live Births                   Home Medications    Prior to Admission medications   Medication Sig Start Date End Date Taking? Authorizing Provider  MULTIPLE VITAMIN tablet Take 1 tablet by mouth daily.   Yes [provider]  nabumetone (RELAFEN) 750 MG tablet TAKE 1 TABLET BY MOUTH TWICE A DAY AS NEEDED FOR ARTHRITIS Patient taking differently: TAKE 1 TABLET BY MOUTH DAILY 07/05/17  Yes Biagio Borg, MD  nitrofurantoin (MACRODANTIN) 100 MG capsule Take 1 capsule (100 mg total) by mouth 2 (two) times daily. Patient taking differently: Take 100 mg by mouth at bedtime.  08/04/17  Yes Biagio Borg, MD  pantoprazole (PROTONIX) 40 MG tablet Take 1 tablet (40 mg total) by mouth daily. 05/11/17  Yes Biagio Borg, MD  YUVAFEM 10 MCG TABS vaginal tablet PLACE 1 TABLET (10 MCG TOTAL) VAGINALLY 2 (TWO) TIMES A WEEK. 07/13/17  Yes Regina Eck, CNM    Family History Family History  Problem Relation Age of Onset  . Breast cancer Mother        dx in her last 50's  . Heart disease Sister   . Breast cancer Sister        dx in her late 34's  . Diabetes Brother   . Heart disease Brother        triple bypass  . Diabetes Maternal Aunt   . Diabetes Maternal Uncle   . Cancer Maternal Grandmother        uterine  . Colon cancer Neg Hx   . Rectal cancer Neg Hx   . Esophageal cancer Neg Hx     Social History Social History  Substance Use Topics  . Smoking status: Never Smoker  . Smokeless tobacco: Never Used  . Alcohol use No     Allergies   Amoxicillin and Penicillins   Review of Systems Review of Systems  Constitutional: Negative for fever.  HENT: Negative  for nosebleeds.   Eyes: Negative for visual disturbance.  Respiratory: Negative for cough and shortness of breath.   Cardiovascular: Negative for chest pain.  Gastrointestinal: Negative for abdominal pain, nausea and vomiting.  Genitourinary: Negative for dysuria.  Musculoskeletal: Positive for arthralgias and joint swelling. Negative for back pain and neck pain.  Skin: Negative for rash.  Neurological: Negative for weakness,  numbness and headaches.     Physical Exam Updated Vital Signs BP 116/75   Pulse 74   Temp 98 F (36.7 C) (Oral)   Resp 11   Ht 5' 7.75" (1.721 m)   Wt 105.2 kg (232 lb)   SpO2 100%   BMI 35.54 kg/m   Physical Exam  Constitutional: She is oriented to person, place, and time. She appears well-developed and well-nourished. No distress.  HENT:  Head: Normocephalic and atraumatic.  Mouth/Throat: Oropharynx is clear and moist.  Eyes: Right eye exhibits no discharge. Left eye exhibits no discharge.  Neck: Neck supple.  Cardiovascular: Normal rate, regular rhythm, normal heart sounds and intact distal pulses.   Bilateral dorsalis pedis pulses are intact. Good capillary refill to her bilateral toes.  Pulmonary/Chest: Effort normal and breath sounds normal. No respiratory distress. She has no wheezes. She has no rales.  Abdominal: Soft. There is no tenderness.  Musculoskeletal: She exhibits edema, tenderness and deformity.  Edema, tenderness and deformity noted to her right ankle. No open fracture. Good capillary refill to her bilateral toes.She also has an area of edema overlying the dorsum of her left foot. No deformity noted. No ankle pain to her left noted. Patient also has some mild pain to her PIP joint of her right third finger. No deformity noted. She is able to make a fist and knuckles are normal alignment. No pain to her right hand. No knee or hip tenderness bilaterally. Leg compartments are soft bilaterally.   Neurological: She is alert and oriented to  person, place, and time. No sensory deficit. Coordination normal.  Sensation is intact her bilateral toes.  Skin: Skin is warm and dry. Capillary refill takes less than 2 seconds. No rash noted. She is not diaphoretic. No erythema. No pallor.  Psychiatric: She has a normal mood and affect. Her behavior is normal.  Nursing note and vitals reviewed.    ED Treatments / Results  Labs (all labs ordered are listed, but only abnormal results are displayed) Labs Reviewed  BASIC METABOLIC PANEL - Abnormal; Notable for the following:       Result Value   Glucose, Bld 119 (*)    BUN 22 (*)    All other components within normal limits  CBC WITH DIFFERENTIAL/PLATELET - Abnormal; Notable for the following:    WBC 13.0 (*)    Neutro Abs 9.2 (*)    All other components within normal limits    EKG  EKG Interpretation None       Radiology Dg Ankle Complete Right  Result Date: 08/19/2017 CLINICAL DATA:  She reports that she missed a step and fell in front of her house. When she fell, she reports twisting her R ankle. Pt states she has generalized right ankle pain that radiates up her ankle. Pt states previous injury to ankle as a child and foot surgery. EXAM: RIGHT ANKLE - COMPLETE 3+ VIEW COMPARISON:  None. FINDINGS: Fracture of the medial malleolus with lateral subluxation of the talus relation to the tibia. There is fracture of the posterior malleolus of the tibia. Fracture of the fibula at the level ankle mortise. Calcaneus appears normal. IMPRESSION: 1. Fracture of the medial malleolus and posterior malleolus of the tibia. 2. Subluxation of the tibia in relation to the talus with disruption of the tibiofibular syndesmosis 3. Fracture of the fibula at the level of the ankle mortise 4. So-called trimalleolar fracture dislocation. Electronically Signed   By: Suzy Bouchard M.D.   On: 08/19/2017  21:45   Dg Finger Middle Right  Result Date: 08/19/2017 CLINICAL DATA:  66 y/o F; initial encounter  with injury to the right little finger. EXAM: RIGHT MIDDLE FINGER 2+V COMPARISON:  None. FINDINGS: There is no evidence of fracture or dislocation. There is no evidence of arthropathy or other focal bone abnormality. Soft tissues are unremarkable. IMPRESSION: Negative. Electronically Signed   By: Kristine Garbe M.D.   On: 08/19/2017 23:18   Dg Foot Complete Left  Result Date: 08/19/2017 CLINICAL DATA:  66 y/o F; a initial encounter with injury to the left foot. EXAM: LEFT FOOT - COMPLETE 3+ VIEW COMPARISON:  None. FINDINGS: Lucency traversing base of fifth metatarsal and lateral cuboid cortex compatible with acute fractures. Additionally, there are small bony bodies dorsal to talus and navicular bone possibly representing avulsion fractures, correlate for focal tenderness. Plantar calcaneal bone spur. IMPRESSION: 1. Lucency traversing base of fifth metatarsal and lateral cuboid cortex compatible with acute fractures. 2. Small bony bodies dorsal to talus and navicular bone possibly representing avulsion fractures, correlate for focal tenderness. Electronically Signed   By: Kristine Garbe M.D.   On: 08/19/2017 23:24    Procedures Reduction of dislocation Date/Time: 08/20/2017 1:44 AM Performed by: Waynetta Pean Authorized by: Waynetta Pean  Consent: Verbal consent obtained. Written consent obtained. Risks and benefits: risks, benefits and alternatives were discussed Consent given by: patient Patient understanding: patient states understanding of the procedure being performed Patient consent: the patient's understanding of the procedure matches consent given Procedure consent: procedure consent matches procedure scheduled Relevant documents: relevant documents present and verified Test results: test results available and properly labeled Site marked: the operative site was marked Imaging studies: imaging studies available Required items: required blood products, implants,  devices, and special equipment available Patient identity confirmed: verbally with patient and arm band Time out: Immediately prior to procedure a "time out" was called to verify the correct patient, procedure, equipment, support staff and site/side marked as required. Patient tolerance: Patient tolerated the procedure well with no immediate complications Comments: Reduction of Trimalleolar fracture of right ankle. Good dorsalis pedis pulse and capillary refill post reduction. Sedation done by Dr. Randal Buba.     (including critical care time)  Medications Ordered in ED Medications  propofol (DIPRIVAN) 10 mg/mL bolus/IV push 52.6 mg (not administered)  propofol (DIPRIVAN) 10 mg/mL bolus/IV push (not administered)  HYDROmorphone (DILAUDID) injection 1 mg (not administered)  ondansetron (ZOFRAN) injection 4 mg (not administered)  0.9 %  sodium chloride infusion (not administered)  morphine 4 MG/ML injection 4 mg (4 mg Intravenous Given 08/19/17 2213)  ondansetron (ZOFRAN) injection 4 mg (4 mg Intravenous Given 08/19/17 2213)  morphine 4 MG/ML injection 4 mg (4 mg Intravenous Given 08/19/17 2329)  HYDROmorphone (DILAUDID) injection 0.5 mg (0.5 mg Intravenous Given 08/20/17 0114)     Initial Impression / Assessment and Plan / ED Course  I have reviewed the triage vital signs and the nursing notes.  Pertinent labs & imaging results that were available during my care of the patient were reviewed by me and considered in my medical decision making (see chart for details).    This is a 66 y.o. Female resents to the emergency department after a slip and fall complaining of right ankle pain,  Left foot pain, and pain to her right third finger. Patient reports she was walking down some steps and she missed a step and her right ankle twisted and she caught herself with her right hand. She reports pain to  her right hand as well as pain to the dorsum of her left foot. She denies hitting her head or loss  of consciousness. She denies any numbness tingling or weakness. She received fentanyl by EMS and reports her pain as a 10 out of 10 currently. She reports she is able to move her toes.  On exam the patient is afebrile nontoxic appearing. She has edema and deformity noted to her right ankle. She has good bilateral dorsalis pedis pulses and good capillary refill to her toes bilaterally. Sensation is intact her bilateral toes. Patient also has edema and tenderness overlying the dorsum of her left foot. Delay compartments feel soft. Left foot x-ray shows fifth metatarsal and lateral cuboid cortex fracture. Right ankle x-ray shows trimalleolar fracture dislocation.  I consulted with orthopedic surgeon Dr. Marlou Sa. He would like [place] to attempt a conscious sedation and reduction of this fracture and dislocation on her right ankle. He like the patient to be admitted to [facility] under his name and he will see the patient tomorrow morning for planned surgery. He does not want any DVT prophylaxis. As needed Dilaudid for pain control.  Patient agrees with plan for conscious sedation and reduction. Consent signed. Dr. Nicholes Stairs performed conscious sedation and I performed reduction of dislocation. Patient tolerated the procedure well.  At shift change patient is awaiting repeat x-rays after dislocation. Dr. Nicholes Stairs will follow up on these x-rays. Plan is for patient to be admitted to come in for Dr. Marlou Sa to see the patient in the morning.  This patient was discussed with and evaluated by Dr. Randal Buba who agrees with assessment and plan.    Final Clinical Impressions(s) / ED Diagnoses   Final diagnoses:  Closed trimalleolar fracture of right ankle, initial encounter  Nondisplaced fracture of fifth metatarsal bone, left foot, initial encounter for closed fracture  Pain of right middle finger    New Prescriptions New Prescriptions   No medications on file     Sharmaine Base 08/20/17 [place], April, MD 08/20/17 [phone]

## 2017-08-19 NOTE — ED Notes (Signed)
Bed: [facility] Expected date:  Expected time:  Means of arrival:  Comments: 3f ankle deformity

## 2017-08-19 NOTE — ED Triage Notes (Signed)
Pt BIB [facility] from home. She reports that she missed a step and fell in front of her house. When she fell, she reports twisting her R ankle. R ankle swelling noted as well as a hematoma noted to the top of her L foot. No LOC. Pt able to move and feel her toes. Ankle splinted and iced by fire department. 139mcg fentanyl given en route last given at 2035. A&Ox4.

## 2017-08-20 ENCOUNTER — Observation Stay (HOSPITAL_COMMUNITY): Payer: Federal, State, Local not specified - PPO

## 2017-08-20 ENCOUNTER — Observation Stay (HOSPITAL_COMMUNITY): Payer: Federal, State, Local not specified - PPO | Admitting: Anesthesiology

## 2017-08-20 ENCOUNTER — Encounter (HOSPITAL_COMMUNITY)
Admission: EM | Disposition: A | Discharge status: Home / Self Care | Payer: Self-pay | Source: Home / Self Care | Attending: Emergency Medicine

## 2017-08-20 ENCOUNTER — Encounter (HOSPITAL_COMMUNITY): Payer: Self-pay | Admitting: Certified Registered Nurse Anesthetist

## 2017-08-20 ENCOUNTER — Emergency Department (HOSPITAL_COMMUNITY): Payer: Federal, State, Local not specified - PPO

## 2017-08-20 DIAGNOSIS — K648 Other hemorrhoids: Secondary | ICD-10-CM | POA: Diagnosis not present

## 2017-08-20 DIAGNOSIS — G8918 Other acute postprocedural pain: Secondary | ICD-10-CM | POA: Diagnosis not present

## 2017-08-20 DIAGNOSIS — S99911A Unspecified injury of right ankle, initial encounter: Secondary | ICD-10-CM | POA: Diagnosis not present

## 2017-08-20 DIAGNOSIS — S82851A Displaced trimalleolar fracture of right lower leg, initial encounter for closed fracture: Secondary | ICD-10-CM | POA: Diagnosis not present

## 2017-08-20 DIAGNOSIS — S82853A Displaced trimalleolar fracture of unspecified lower leg, initial encounter for closed fracture: Secondary | ICD-10-CM | POA: Diagnosis present

## 2017-08-20 DIAGNOSIS — S82899A Other fracture of unspecified lower leg, initial encounter for closed fracture: Secondary | ICD-10-CM | POA: Diagnosis present

## 2017-08-20 DIAGNOSIS — K219 Gastro-esophageal reflux disease without esophagitis: Secondary | ICD-10-CM | POA: Diagnosis not present

## 2017-08-20 HISTORY — PX: ORIF ANKLE FRACTURE: SHX5408

## 2017-08-20 LAB — MRSA PCR SCREENING: MRSA BY PCR: NEGATIVE

## 2017-08-20 SURGERY — OPEN REDUCTION INTERNAL FIXATION (ORIF) ANKLE FRACTURE
Anesthesia: General | Site: Ankle | Laterality: Right

## 2017-08-20 MED ORDER — HYDROMORPHONE HCL 1 MG/ML IJ SOLN: 0.2500 mg | INTRAMUSCULAR | Status: DC | PRN

## 2017-08-20 MED ORDER — DEXAMETHASONE SODIUM PHOSPHATE 10 MG/ML IJ SOLN
INTRAMUSCULAR | Status: DC | PRN
  Administered 2017-08-20: 10 mg via INTRAVENOUS

## 2017-08-20 MED ORDER — BUPIVACAINE-EPINEPHRINE (PF) 0.5% -1:200000 IJ SOLN
INTRAMUSCULAR | Status: DC | PRN
  Administered 2017-08-20: 30 mL via PERINEURAL

## 2017-08-20 MED ORDER — CLINDAMYCIN PHOSPHATE 900 MG/50ML IV SOLN
INTRAVENOUS | Status: DC | PRN
  Administered 2017-08-20: 900 mg via INTRAVENOUS

## 2017-08-20 MED ORDER — METOCLOPRAMIDE HCL 5 MG/ML IJ SOLN: 5.0000 mg | Freq: Three times a day (TID) | INTRAMUSCULAR | Status: DC | PRN

## 2017-08-20 MED ORDER — POVIDONE-IODINE 10 % EX SWAB
2.0000 "application " | Freq: Once | CUTANEOUS | Status: AC
  Administered 2017-08-20: 2 via TOPICAL

## 2017-08-20 MED ORDER — ACETAMINOPHEN 325 MG PO TABS
650.0000 mg | ORAL_TABLET | Freq: Four times a day (QID) | ORAL | Status: DC | PRN
  Administered 2017-08-21 – 2017-08-22 (×2): 650 mg via ORAL
  Filled 2017-08-20 (×2): qty 2

## 2017-08-20 MED ORDER — CLINDAMYCIN PHOSPHATE 600 MG/50ML IV SOLN
600.0000 mg | Freq: Four times a day (QID) | INTRAVENOUS | Status: AC
  Administered 2017-08-20 – 2017-08-21 (×2): 600 mg via INTRAVENOUS
  Filled 2017-08-20 (×2): qty 50

## 2017-08-20 MED ORDER — ONDANSETRON HCL 4 MG/2ML IJ SOLN: 4.0000 mg | Freq: Three times a day (TID) | INTRAMUSCULAR | Status: AC | PRN

## 2017-08-20 MED ORDER — METHOCARBAMOL 500 MG PO TABS
500.0000 mg | ORAL_TABLET | Freq: Four times a day (QID) | ORAL | Status: DC | PRN
  Administered 2017-08-21 – 2017-08-22 (×3): 500 mg via ORAL
  Filled 2017-08-20 (×3): qty 1

## 2017-08-20 MED ORDER — SCOPOLAMINE 1 MG/3DAYS TD PT72
MEDICATED_PATCH | TRANSDERMAL | Status: AC
  Filled 2017-08-20: qty 1

## 2017-08-20 MED ORDER — BUPIVACAINE HCL (PF) 0.5 % IJ SOLN
INTRAMUSCULAR | Status: DC | PRN
  Administered 2017-08-20: 10 mL

## 2017-08-20 MED ORDER — ONDANSETRON HCL 4 MG/2ML IJ SOLN
INTRAMUSCULAR | Status: AC
  Filled 2017-08-20: qty 2

## 2017-08-20 MED ORDER — FENTANYL CITRATE (PF) 100 MCG/2ML IJ SOLN
INTRAMUSCULAR | Status: DC | PRN
  Administered 2017-08-20 (×3): 50 ug via INTRAVENOUS

## 2017-08-20 MED ORDER — MIDAZOLAM HCL 2 MG/2ML IJ SOLN
INTRAMUSCULAR | Status: AC
  Filled 2017-08-20: qty 2

## 2017-08-20 MED ORDER — CLINDAMYCIN PHOSPHATE 900 MG/50ML IV SOLN
INTRAVENOUS | Status: AC
  Filled 2017-08-20: qty 50

## 2017-08-20 MED ORDER — HYDROMORPHONE HCL 1 MG/ML IJ SOLN
1.0000 mg | INTRAMUSCULAR | Status: AC | PRN
  Administered 2017-08-20: 1 mg via INTRAVENOUS
  Filled 2017-08-20: qty 1

## 2017-08-20 MED ORDER — PROPOFOL 10 MG/ML IV BOLUS
0.5000 mg/kg | Freq: Once | INTRAVENOUS | Status: AC
  Administered 2017-08-20: 52.6 mg via INTRAVENOUS

## 2017-08-20 MED ORDER — ONDANSETRON HCL 4 MG PO TABS: 4.0000 mg | ORAL_TABLET | Freq: Four times a day (QID) | ORAL | Status: DC | PRN

## 2017-08-20 MED ORDER — SODIUM CHLORIDE 0.9 % IV SOLN
INTRAVENOUS | Status: AC | PRN
  Administered 2017-08-20: 1000 mL via INTRAVENOUS

## 2017-08-20 MED ORDER — 0.9 % SODIUM CHLORIDE (POUR BTL) OPTIME
TOPICAL | Status: DC | PRN
  Administered 2017-08-20 (×3): 1000 mL

## 2017-08-20 MED ORDER — CHLORHEXIDINE GLUCONATE 4 % EX LIQD: 60.0000 mL | Freq: Once | CUTANEOUS | Status: DC

## 2017-08-20 MED ORDER — PROPOFOL 10 MG/ML IV BOLUS
INTRAVENOUS | Status: AC
  Filled 2017-08-20: qty 20

## 2017-08-20 MED ORDER — PROPOFOL 10 MG/ML IV BOLUS
INTRAVENOUS | Status: DC | PRN
  Administered 2017-08-20: 150 mg via INTRAVENOUS

## 2017-08-20 MED ORDER — CLINDAMYCIN PHOSPHATE 900 MG/50ML IV SOLN: 900.0000 mg | INTRAVENOUS | Status: DC

## 2017-08-20 MED ORDER — LACTATED RINGERS IV SOLN
INTRAVENOUS | Status: DC | PRN
  Administered 2017-08-20 (×3): via INTRAVENOUS

## 2017-08-20 MED ORDER — OXYCODONE HCL 5 MG PO TABS
5.0000 mg | ORAL_TABLET | ORAL | Status: DC | PRN
  Administered 2017-08-20: 5 mg via ORAL
  Administered 2017-08-21 (×4): 10 mg via ORAL
  Administered 2017-08-21: 5 mg via ORAL
  Administered 2017-08-22: 10 mg via ORAL
  Filled 2017-08-20 (×3): qty 2
  Filled 2017-08-20: qty 1
  Filled 2017-08-20: qty 2
  Filled 2017-08-20: qty 1
  Filled 2017-08-20: qty 2

## 2017-08-20 MED ORDER — HYDROMORPHONE HCL 1 MG/ML IJ SOLN
0.5000 mg | Freq: Once | INTRAMUSCULAR | Status: AC
  Administered 2017-08-20: 0.5 mg via INTRAVENOUS
  Filled 2017-08-20: qty 1

## 2017-08-20 MED ORDER — SODIUM CHLORIDE 0.9 % IV SOLN
INTRAVENOUS | Status: AC
  Administered 2017-08-20: 02:00:00 via INTRAVENOUS

## 2017-08-20 MED ORDER — FENTANYL CITRATE (PF) 250 MCG/5ML IJ SOLN
INTRAMUSCULAR | Status: AC
  Filled 2017-08-20: qty 5

## 2017-08-20 MED ORDER — PROPOFOL 10 MG/ML IV BOLUS
INTRAVENOUS | Status: AC | PRN
  Administered 2017-08-20: 52 mg via INTRAVENOUS

## 2017-08-20 MED ORDER — METOCLOPRAMIDE HCL 5 MG PO TABS: 5.0000 mg | ORAL_TABLET | Freq: Three times a day (TID) | ORAL | Status: DC | PRN

## 2017-08-20 MED ORDER — PANTOPRAZOLE SODIUM 40 MG PO TBEC
40.0000 mg | DELAYED_RELEASE_TABLET | Freq: Every day | ORAL | Status: DC
  Administered 2017-08-20 – 2017-08-22 (×3): 40 mg via ORAL
  Filled 2017-08-20 (×3): qty 1

## 2017-08-20 MED ORDER — METHOCARBAMOL 1000 MG/10ML IJ SOLN
500.0000 mg | Freq: Four times a day (QID) | INTRAVENOUS | Status: DC | PRN
  Filled 2017-08-20 (×4): qty 5

## 2017-08-20 MED ORDER — ADULT MULTIVITAMIN W/MINERALS CH
1.0000 | ORAL_TABLET | Freq: Every day | ORAL | Status: DC
Start: 2017-08-21 — End: 2017-08-22
  Administered 2017-08-21 – 2017-08-22 (×2): 1 via ORAL
  Filled 2017-08-20 (×2): qty 1

## 2017-08-20 MED ORDER — MIDAZOLAM HCL 5 MG/5ML IJ SOLN
INTRAMUSCULAR | Status: DC | PRN
  Administered 2017-08-20: 2 mg via INTRAVENOUS

## 2017-08-20 MED ORDER — ISOPROPYL ALCOHOL 70 % SOLN
Status: AC
  Filled 2017-08-20: qty 480

## 2017-08-20 MED ORDER — ONDANSETRON HCL 4 MG/2ML IJ SOLN
4.0000 mg | Freq: Four times a day (QID) | INTRAMUSCULAR | Status: DC | PRN
Start: 2017-08-20 — End: 2017-08-22

## 2017-08-20 MED ORDER — SCOPOLAMINE 1 MG/3DAYS TD PT72
MEDICATED_PATCH | TRANSDERMAL | Status: DC | PRN
  Administered 2017-08-20: 1 via TRANSDERMAL

## 2017-08-20 MED ORDER — PHENYLEPHRINE 40 MCG/ML (10ML) SYRINGE FOR IV PUSH (FOR BLOOD PRESSURE SUPPORT)
PREFILLED_SYRINGE | INTRAVENOUS | Status: DC | PRN
  Administered 2017-08-20: 80 ug via INTRAVENOUS

## 2017-08-20 MED ORDER — RIVAROXABAN 10 MG PO TABS
10.0000 mg | ORAL_TABLET | Freq: Every day | ORAL | Status: DC
  Administered 2017-08-21 – 2017-08-22 (×2): 10 mg via ORAL
  Filled 2017-08-20 (×2): qty 1

## 2017-08-20 MED ORDER — EPHEDRINE SULFATE-NACL 50-0.9 MG/10ML-% IV SOSY
PREFILLED_SYRINGE | INTRAVENOUS | Status: DC | PRN
  Administered 2017-08-20 (×3): 10 mg via INTRAVENOUS

## 2017-08-20 MED ORDER — ACETAMINOPHEN 650 MG RE SUPP: 650.0000 mg | Freq: Four times a day (QID) | RECTAL | Status: DC | PRN

## 2017-08-20 MED ORDER — MORPHINE SULFATE (PF) 4 MG/ML IV SOLN
2.0000 mg | INTRAVENOUS | Status: DC | PRN
Start: 2017-08-20 — End: 2017-08-22

## 2017-08-20 MED ORDER — ONDANSETRON HCL 4 MG/2ML IJ SOLN
INTRAMUSCULAR | Status: DC | PRN
  Administered 2017-08-20: 4 mg via INTRAVENOUS

## 2017-08-20 MED ORDER — PROPOFOL 10 MG/ML IV BOLUS
INTRAVENOUS | Status: AC
  Administered 2017-08-20: 52.6 mg via INTRAVENOUS
  Filled 2017-08-20: qty 20

## 2017-08-20 MED ORDER — LIDOCAINE 2% (20 MG/ML) 5 ML SYRINGE
INTRAMUSCULAR | Status: DC | PRN
  Administered 2017-08-20: 60 mg via INTRAVENOUS

## 2017-08-20 MED ORDER — ISOPROPYL ALCOHOL 70 % SOLN
Status: DC | PRN
  Administered 2017-08-20: 1 via TOPICAL

## 2017-08-20 SURGICAL SUPPLY — 60 items
BAG SPEC THK2 15X12 ZIP CLS (MISCELLANEOUS) ×1
BAG ZIPLOCK 12X15 (MISCELLANEOUS) ×2 IMPLANT
BANDAGE ACE 4X5 VEL STRL LF (GAUZE/BANDAGES/DRESSINGS) ×2 IMPLANT
BANDAGE ACE 6X5 VEL STRL LF (GAUZE/BANDAGES/DRESSINGS) ×2 IMPLANT
BIT DRILL 2.0 QC 6.3IN (BIT) ×1 IMPLANT
BIT DRILL 2.7 QC CANN 155 (BIT) ×2 IMPLANT
BIT DRILL 3.5 QC 155 (BIT) ×1 IMPLANT
BIT DRILL QC 2.7 6.3IN  SHORT (BIT) ×1
BIT DRILL QC 2.7 6.3IN SHORT (BIT) IMPLANT
BNDG CMPR 9X4 STRL LF SNTH (GAUZE/BANDAGES/DRESSINGS) ×1
BNDG ESMARK 4X9 LF (GAUZE/BANDAGES/DRESSINGS) ×2 IMPLANT
CUFF TOURN SGL QUICK 34 (TOURNIQUET CUFF) ×2
CUFF TRNQT CYL 34X4X40X1 (TOURNIQUET CUFF) ×1 IMPLANT
DECANTER SPIKE VIAL GLASS SM (MISCELLANEOUS) ×2 IMPLANT
DRAPE C-ARM 42X120 X-RAY (DRAPES) ×2 IMPLANT
DRAPE C-ARMOR (DRAPES) ×1 IMPLANT
DRAPE SURG 17X11 SM STRL (DRAPES) ×1 IMPLANT
DRAPE U-SHAPE 47X51 STRL (DRAPES) ×4 IMPLANT
DRSG PAD ABDOMINAL 8X10 ST (GAUZE/BANDAGES/DRESSINGS) ×8 IMPLANT
ELECT REM PT RETURN 15FT ADLT (MISCELLANEOUS) ×2 IMPLANT
GAUZE SPONGE 4X4 12PLY STRL (GAUZE/BANDAGES/DRESSINGS) ×2 IMPLANT
GAUZE XEROFORM 5X9 LF (GAUZE/BANDAGES/DRESSINGS) ×1 IMPLANT
GLOVE SURG ORTHO 8.0 STRL STRW (GLOVE) ×3 IMPLANT
GOWN STRL REUS W/TWL LRG LVL3 (GOWN DISPOSABLE) ×2 IMPLANT
GUIDE PIN 1.3 (PIN) ×4
NEEDLE HYPO 22GX1.5 SAFETY (NEEDLE) ×2 IMPLANT
NS IRRIG 1000ML POUR BTL (IV SOLUTION) ×2 IMPLANT
PACK ORTHO EXTREMITY (CUSTOM PROCEDURE TRAY) ×2 IMPLANT
PAD ABD 8X10 STRL (GAUZE/BANDAGES/DRESSINGS) ×5 IMPLANT
PAD CAST 4YDX4 CTTN HI CHSV (CAST SUPPLIES) ×1 IMPLANT
PADDING CAST COTTON 4X4 STRL (CAST SUPPLIES) ×2
PADDING CAST COTTON 6X4 STRL (CAST SUPPLIES) ×2 IMPLANT
PIN GUIDE 1.3 (PIN) ×2 IMPLANT
PLATE FIBULA DISTAL 7 HOLE (Plate) ×1 IMPLANT
POSITIONER SURGICAL ARM (MISCELLANEOUS) ×2 IMPLANT
SCREW CANC 5.0X14 (Screw) ×1 IMPLANT
SCREW CANCELLOUS 5.0X16MM (Screw) ×1 IMPLANT
SCREW CANNULATED 4.0X36 (Screw) ×2 IMPLANT
SCREW CANNULATED 4.1X40 (Screw) ×6 IMPLANT
SCREW CORTEX 2.7X20MM (Screw) ×1 IMPLANT
SCREW CORTEX S-T 2.7X16MM (Screw) ×1 IMPLANT
SCREW LOCK 10X3.5XST NS (Screw) IMPLANT
SCREW LOCK 12X3.5XST PRLC (Screw) IMPLANT
SCREW LOCK 3.5X10 (Screw) ×6 IMPLANT
SCREW LOCK 3.5X12 (Screw) ×2 IMPLANT
SCREW LOCK 3.5X14 (Screw) ×1 IMPLANT
SCREW NLCK 16X3.5XST CORT PRLC (Screw) ×1 IMPLANT
SCREW NON LOCK 3.5X12 (Screw) ×3 IMPLANT
SCREW NONLOCK 3.5X16 (Screw) ×2 IMPLANT
SOL PREP PROV IODINE SCRUB 4OZ (MISCELLANEOUS) ×2 IMPLANT
SPLINT PLASTER CAST XFAST 5X30 (CAST SUPPLIES) IMPLANT
SPLINT PLASTER XFAST SET 5X30 (CAST SUPPLIES) ×2
STOCKINETTE 8 INCH (MISCELLANEOUS) ×2 IMPLANT
SUT ETHILON 3 0 PS 1 (SUTURE) ×4 IMPLANT
SUT ETHILON 4 0 PS 2 18 (SUTURE) ×5 IMPLANT
SUT VIC AB 2-0 CT1 27 (SUTURE) ×10
SUT VIC AB 2-0 CT1 TAPERPNT 27 (SUTURE) ×1 IMPLANT
SYR CONTROL 10ML LL (SYRINGE) ×2 IMPLANT
TOWEL OR 17X26 10 PK STRL BLUE (TOWEL DISPOSABLE) ×4 IMPLANT
WATER STERILE IRR 1500ML POUR (IV SOLUTION) ×2 IMPLANT

## 2017-08-20 NOTE — Op Note (Signed)
NAMEROGAN, ECKLUND NO.:  [id number]  MEDICAL RECORD NO.:  [id number]  LOCATION:  [facility]                        FACILITY:  [facility]  PHYSICIAN:  Anderson Malta, M.D.    DATE OF BIRTH:  February 20, 1951  DATE OF PROCEDURE: DATE OF DISCHARGE:                              OPERATIVE REPORT   PREOPERATIVE DIAGNOSIS:  Trimalleolar ankle fracture on the right.  POSTOPERATIVE DIAGNOSIS:  Trimalleolar ankle fracture on the right.  PROCEDURE:  Trimalleolar ankle fracture open reduction and internal fixation of all three malleoli.  SURGEON:  Anderson Malta, M.D.  ASSIST:  None.  INDICATIONS:  Danelle is a 66 year old patient with trimalleolar ankle fracture on the right.  She presents for operative management after explanation of risks and benefits.  PROCEDURE IN DETAIL:  The patient was brought to the operating room where general anesthetic was induced.  Preoperative antibiotics were administered.  Time-out was called.  Right leg was prescrubbed with alcohol and Betadine, allowed to air dry, prepped with DuraPrep solution and draped in a sterile manner.  Charlie Pitter was used to cover the operative field.  Time-out was called.  Ankle Esmarch utilized for approximately 2 hours.  Lateral approach was made to the lateral malleolus.  Skin and subcutaneous tissue were sharply divided.  Superficial peroneal nerve was identified and mobilized anteriorly and protected.  The patient's bone quality was marginal.  The initial irrigation was performed. Fracture reduction performed and held with a clamp.  This was a reduction forceps.  Lag screw placed proximal-anterior to distal- posterior, but it did not hold.  Second screw was then added, which was a 2.7 lag screw.  First lag screw 3.5.  Between these lag screws, sufficient stabilization was achieved.  Plate was then applied.  This was a Cabin crew 7-hole plate.  Nonlocking screws placed proximally, locking screws placed distally.  This  did achieve reasonable reduction.  Screw length confirmed in the AP and lateral planes under fluoroscopy.  At this time, attention directed towards the medial side. There was an abrasion just anterior to the fracture.  This area was avoided and was covered with the [place].  Fracture localized under fluoroscopy and an incision was made posterior to this dime-sized abrasion.  The fracture was identified and reduced and held in position. Two 40-mm cannulated, partially-threaded cancellous screws 4.0 mm were used to achieve good fixation.  Fixation was good in the AP and lateral planes.  There was some comminution of the posterior aspect of that medial malleolar fragment.  Following medial malleolar fragment fixation, fluoroscopy demonstrated some proximal migration of the posterior malleolar piece, which was about 25-30% of the articular surface.  Through that lateral incision, tenaculum was placed and blunt dissection was performed to get to the posterior malleolus.  This was then translated distally and held in position with two screws placed through an anterolateral approach of about 2.5 to 3 cm.  In this approach, the neurovascular bundle was mobilized medially and the superficial peroneal nerve was mobilized laterally.  Good fixation was achieved.  At this time, ankle Esmarch was released.  Thorough irrigation was performed on all incisions, which were then closed  using combination of 2-0 Vicryl and 3-0 nylon.  Well-padded posterior splint applied.  The patient tolerated the procedure well without immediate complication, transferred to the recovery room in stable condition.     Anderson Malta, M.D.     GSD/MEDQ  D:  08/20/2017  T:  08/20/2017  Job:  [id number]

## 2017-08-20 NOTE — H&P (Signed)
[name] is an 66 y.o. female.   Chief Complaint:right ankle pain  HPI: [name] is a 66 year old patient with right ankle pain.  She had a fall late last night.  She describes no other orthopedic complaints.  She feels like the left ankle sprain but the right ankle is very painful.  She missed a step coming down stairs.  She denies any family history or personal history of DVT or pulmonary embolism  Past Medical History:  Diagnosis Date  . Abnormal Pap smear of vagina 06/17/15   LGSIL  . Allergic rhinitis, cause unspecified   . Arthritis   . ARTHRITIS, KNEE 02/10/2009   Qualifier: Diagnosis of  By: Niel Hummer MD, Lorinda Creed   . BRCA negative 03/25/13  . Cervical cancer ([place]) 1984  . Cervical disc disease 02/11/2012  . Chronic LBP   . Degenerative arthritis of hip 02/11/2012  . Diverticulosis 02/11/2012  . GERD 08/10/2007   Qualifier: Diagnosis of  By: Sherlynn Stalls, CMA, [place]    . GERD (gastroesophageal reflux disease)   . GERD (gastroesophageal reflux disease)   . HEMORRHOIDS-INTERNAL 07/21/2010   Qualifier: Diagnosis of  By: Chester Holstein NP, Nevin Bloodgood    . Hiatal hernia   . Hyperlipidemia 02/13/2012  . Mitral valve prolapse   . OAB (overactive bladder)   . Obesity   . OVERACTIVE BLADDER 08/10/2007   Qualifier: Diagnosis of  By: Sherlynn Stalls, CMA, [place]    . Pneumonia   . PONV (postoperative nausea and vomiting)   . Recurrent UTI   . SUI (stress urinary incontinence, female)   . VAGINITIS, ATROPHIC 11/11/2008   Qualifier: Diagnosis of  By: Niel Hummer MD, Lorinda Creed     Past Surgical History:  Procedure Laterality Date  . BUNIONECTOMY  2007  . CERVICAL DISCECTOMY  2002  . CHOLECYSTECTOMY N/A 08/13/2013   Procedure: LAPAROSCOPIC CHOLECYSTECTOMY WITH INTRAOPERATIVE CHOLANGIOGRAM;  Surgeon: Shann Medal, MD;  Location: [facility];  Service: General;  Laterality: N/A;  . COLONOSCOPY    . conization of cervix  1982   with D&C  . CYSTOCELE REPAIR  2003   rectocele repair  . EYE SURGERY Bilateral    lasik  .  SHOULDER ARTHROSCOPY  2004   frozen shoulder  . TONSILLECTOMY    . VAGINAL HYSTERECTOMY  1986    Family History  Problem Relation Age of Onset  . Breast cancer Mother        dx in her last 16's  . Heart disease Sister   . Breast cancer Sister        dx in her late 56's  . Diabetes Brother   . Heart disease Brother        triple bypass  . Diabetes Maternal Aunt   . Diabetes Maternal Uncle   . Cancer Maternal Grandmother        uterine  . Colon cancer Neg Hx   . Rectal cancer Neg Hx   . Esophageal cancer Neg Hx    Social History:  reports that she has never smoked. She has never used smokeless tobacco. She reports that she does not drink alcohol or use drugs.  Allergies:  Allergies  Allergen Reactions  . Amoxicillin     REACTION: unspecified  . Penicillins Other (See Comments)    Unknown reaction     (Not in a hospital admission)  Results for orders placed or performed during the hospital encounter of 08/19/17 (from the past 48 hour(s))  Basic metabolic panel  Status: Abnormal   Collection Time: 08/19/17 10:05 PM  Result Value Ref Range   Sodium 142 135 - 145 mmol/L   Potassium 3.6 3.5 - 5.1 mmol/L   Chloride 108 101 - 111 mmol/L   CO2 26 22 - 32 mmol/L   Glucose, Bld 119 (H) 65 - 99 mg/dL   BUN 22 (H) 6 - 20 mg/dL   Creatinine, Ser 0.96 0.44 - 1.00 mg/dL   Calcium 9.0 8.9 - 10.3 mg/dL   GFR calc non Af Amer >60 >60 mL/min   GFR calc Af Amer >60 >60 mL/min    Comment: (NOTE) The eGFR has been calculated using the CKD EPI equation. This calculation has not been validated in all clinical situations. eGFR's persistently <60 mL/min signify possible Chronic Kidney Disease.    Anion gap 8 5 - 15  CBC with Differential     Status: Abnormal   Collection Time: 08/19/17 10:05 PM  Result Value Ref Range   WBC 13.0 (H) 4.0 - 10.5 K/uL   RBC 4.31 3.87 - 5.11 MIL/uL   Hemoglobin 12.7 12.0 - 15.0 g/dL   HCT 38.2 36.0 - 46.0 %   MCV 88.6 78.0 - 100.0 fL   MCH 29.5  26.0 - 34.0 pg   MCHC 33.2 30.0 - 36.0 g/dL   RDW 12.7 11.5 - 15.5 %   Platelets 231 150 - 400 K/uL   Neutrophils Relative % 71 %   Neutro Abs 9.2 (H) 1.7 - 7.7 K/uL   Lymphocytes Relative 23 %   Lymphs Abs 3.0 0.7 - 4.0 K/uL   Monocytes Relative 5 %   Monocytes Absolute 0.6 0.1 - 1.0 K/uL   Eosinophils Relative 1 %   Eosinophils Absolute 0.2 0.0 - 0.7 K/uL   Basophils Relative 0 %   Basophils Absolute 0.0 0.0 - 0.1 K/uL   Dg Ankle Complete Right  Result Date: 08/20/2017 CLINICAL DATA:  Status post reduction of right ankle trimalleolar fracture. Initial encounter. EXAM: RIGHT ANKLE - COMPLETE 3+ VIEW COMPARISON:  Right ankle radiographs performed 08/19/2017 FINDINGS: There is improved alignment of the trimalleolar fracture of the right ankle, with residual superior and posterior displacement of the posterior malleolar fragment. Previously noted subluxation has resolved. An associated splint is noted. A plantar calcaneal spur is seen. No new fractures are identified. IMPRESSION: Improved alignment of trimalleolar fracture of the right ankle. Electronically Signed   By: Garald Balding M.D.   On: 08/20/2017 02:28   Dg Ankle Complete Right  Result Date: 08/19/2017 CLINICAL DATA:  She reports that she missed a step and fell in front of her house. When she fell, she reports twisting her R ankle. Pt states she has generalized right ankle pain that radiates up her ankle. Pt states previous injury to ankle as a child and foot surgery. EXAM: RIGHT ANKLE - COMPLETE 3+ VIEW COMPARISON:  None. FINDINGS: Fracture of the medial malleolus with lateral subluxation of the talus relation to the tibia. There is fracture of the posterior malleolus of the tibia. Fracture of the fibula at the level ankle mortise. Calcaneus appears normal. IMPRESSION: 1. Fracture of the medial malleolus and posterior malleolus of the tibia. 2. Subluxation of the tibia in relation to the talus with disruption of the tibiofibular  syndesmosis 3. Fracture of the fibula at the level of the ankle mortise 4. So-called trimalleolar fracture dislocation. Electronically Signed   By: Suzy Bouchard M.D.   On: 08/19/2017 21:45   Dg Finger Middle Right  Result Date: 08/19/2017 CLINICAL DATA:  66 y/o F; initial encounter with injury to the right little finger. EXAM: RIGHT MIDDLE FINGER 2+V COMPARISON:  None. FINDINGS: There is no evidence of fracture or dislocation. There is no evidence of arthropathy or other focal bone abnormality. Soft tissues are unremarkable. IMPRESSION: Negative. Electronically Signed   By: Kristine Garbe M.D.   On: 08/19/2017 23:18   Dg Foot Complete Left  Result Date: 08/19/2017 CLINICAL DATA:  66 y/o F; a initial encounter with injury to the left foot. EXAM: LEFT FOOT - COMPLETE 3+ VIEW COMPARISON:  None. FINDINGS: Lucency traversing base of fifth metatarsal and lateral cuboid cortex compatible with acute fractures. Additionally, there are small bony bodies dorsal to talus and navicular bone possibly representing avulsion fractures, correlate for focal tenderness. Plantar calcaneal bone spur. IMPRESSION: 1. Lucency traversing base of fifth metatarsal and lateral cuboid cortex compatible with acute fractures. 2. Small bony bodies dorsal to talus and navicular bone possibly representing avulsion fractures, correlate for focal tenderness. Electronically Signed   By: Kristine Garbe M.D.   On: 08/19/2017 23:24    Review of Systems  Musculoskeletal: Positive for joint pain.  All other systems reviewed and are negative.   Blood pressure 97/61, pulse 68, temperature 98 F (36.7 C), temperature source Oral, resp. rate 16, height 5' 7.75" (1.721 m), weight 232 lb (105.2 kg), SpO2 (!) 88 %. Physical Exam  Constitutional: She appears well-developed.  HENT:  Head: Normocephalic.  Eyes: Pupils are equal, round, and reactive to light.  Neck: Normal range of motion.  Cardiovascular: Normal rate.    Respiratory: Effort normal.  Neurological: She is alert.  Skin: Skin is warm.  Psychiatric: She has a normal mood and affect.   examination of the right ankle demonstrates that it is well splinted.  Pedal pulses intact.  Sensation intact on the dorsal and plantar aspect of the foot.  Left foot demonstrates mild lateral tenderness but intact ankle dorsiflexion plantar flexion inversion and eversion strength.  No pain in the knees or hips with motion.  Bilateral upper chemise have good range of motion as well  Assessment/Plan Impression is trimalleolar right ankle fracture and left cuboid and fifth metatarsal base fracture.  Plan is open reduction internal fixation of the right ankle fracture involving all 3 malleoli.  Risks and benefits discussed including not limited to infection or vessel damage potential need for more surgery and incomplete healing.  For the left ankle she can be weightbearing as tolerated in a hard sole shoe.  She has good family support at home.  There is no family or personal history of DVT or pulmonary embolism.  Planned surgery today with discharge to home today versus tomorrow depending on the amount of pain she is in.  Anderson Malta, MD 08/20/2017, 10:30 AM

## 2017-08-20 NOTE — ED Notes (Signed)
148 mg of propofol wasted with Kallie Locks, RN.

## 2017-08-20 NOTE — Brief Op Note (Signed)
08/19/2017 - 08/20/2017  3:32 PM  PATIENT:  [name]  66 y.o. female  PRE-OPERATIVE DIAGNOSIS:  right ankle fracture  POST-OPERATIVE DIAGNOSIS:  right ankle fracture  PROCEDURE:  Procedure(s): OPEN REDUCTION INTERNAL FIXATION (ORIF) ANKLE FRACTURE  SURGEON:  Surgeon(s): Meredith Pel, MD  ASSISTANT:none  ANESTHESIA:   general  EBL: 50 ml    Total I/O In: 2000 [I.V.:2000] Out: 50 [Blood:50]  BLOOD ADMINISTERED: none  DRAINS: none   LOCAL MEDICATIONS USED:  none  SPECIMEN:  No Specimen  COUNTS:  YES  TOURNIQUET:   Total Tourniquet Time Documented: Leg (Right) - 116 minutes Total: Leg (Right) - 116 minutes   DICTATION: .Other Dictation: Dictation Number [id number]  PLAN OF CARE: Admit for overnight observation  PATIENT DISPOSITION:  PACU - hemodynamically stable

## 2017-08-20 NOTE — ED Provider Notes (Signed)
[facility] DEPT Provider Note   CSN: [id number] Arrival date & time: 08/19/17  2045  Medical screening examination/treatment/procedure(s) were conducted as a shared visit with non-physician practitioner(s) and myself.  I personally evaluated the patient during the encounter.   EKG Interpretation None        History   Chief Complaint Chief Complaint  Patient presents with  . Fall  . Ankle Injury    R    HPI [name] is a 66 y.o. female.  The history is provided by the patient.  Ankle Injury  This is a new problem. The current episode started 6 to 12 hours ago. The problem occurs constantly. The problem has not changed since onset.Pertinent negatives include no chest pain and no abdominal pain. Nothing aggravates the symptoms. Nothing relieves the symptoms. She has tried nothing for the symptoms. The treatment provided no relief.    Past Medical History:  Diagnosis Date  . Abnormal Pap smear of vagina 06/17/15   LGSIL  . Allergic rhinitis, cause unspecified   . Arthritis   . ARTHRITIS, KNEE 02/10/2009   Qualifier: Diagnosis of  By: Niel Hummer MD, Lorinda Creed   . BRCA negative 03/25/13  . Cervical cancer ([place]) 1984  . Cervical disc disease 02/11/2012  . Chronic LBP   . Degenerative arthritis of hip 02/11/2012  . Diverticulosis 02/11/2012  . GERD 08/10/2007   Qualifier: Diagnosis of  By: Sherlynn Stalls, CMA, [place]    . GERD (gastroesophageal reflux disease)   . GERD (gastroesophageal reflux disease)   . HEMORRHOIDS-INTERNAL 07/21/2010   Qualifier: Diagnosis of  By: Chester Holstein NP, Nevin Bloodgood    . Hiatal hernia   . Hyperlipidemia 02/13/2012  . Mitral valve prolapse   . OAB (overactive bladder)   . Obesity   . OVERACTIVE BLADDER 08/10/2007   Qualifier: Diagnosis of  By: Sherlynn Stalls, CMA, [place]    . Pneumonia   . PONV (postoperative nausea and vomiting)   . Recurrent UTI   . SUI (stress urinary incontinence, female)   . VAGINITIS, ATROPHIC 11/11/2008   Qualifier: Diagnosis of  By: Niel Hummer  MD, Lorinda Creed     Patient Active Problem List   Diagnosis Date Noted  . Trimalleolar fracture 08/20/2017  . Sleep-disordered breathing 07/30/2017  . Right hip pain 07/28/2017  . Abnormal TSH 07/21/2015  . URI (upper respiratory infection) 04/17/2015  . Greater trochanteric bursitis of right hip 08/04/2014  . Localized osteoarthrosis, lower leg 08/04/2014  . Biliary colic 92/09/9416  . Cholelithiasis 08/07/2013  . Left otitis media 12/07/2012  . Hyperlipidemia 02/13/2012  . Impaired glucose tolerance 02/13/2012  . Preventative health care 02/11/2012  . Diverticulosis 02/11/2012  . Degenerative arthritis of hip 02/11/2012  . Cervical disc disease 02/11/2012  . Cervical cancer ([place])   . Allergic rhinitis, cause unspecified   . Chronic LBP   . Obesity   . Blood in stool 08/31/2010  . HEMORRHOIDS-INTERNAL 07/21/2010  . CONSTIPATION 07/19/2010  . ARTHRITIS, KNEE 02/10/2009  . VAGINITIS, ATROPHIC 11/11/2008  . EXOGENOUS OBESITY 07/29/2008  . GERD 08/10/2007  . OVERACTIVE BLADDER 08/10/2007    Past Surgical History:  Procedure Laterality Date  . BUNIONECTOMY  2007  . CERVICAL DISCECTOMY  2002  . CHOLECYSTECTOMY N/A 08/13/2013   Procedure: LAPAROSCOPIC CHOLECYSTECTOMY WITH INTRAOPERATIVE CHOLANGIOGRAM;  Surgeon: Shann Medal, MD;  Location: [facility];  Service: General;  Laterality: N/A;  . COLONOSCOPY    . conization of cervix  1982   with D&C  . CYSTOCELE REPAIR  2003   rectocele repair  . EYE SURGERY Bilateral    lasik  . SHOULDER ARTHROSCOPY  2004   frozen shoulder  . TONSILLECTOMY    . VAGINAL HYSTERECTOMY  1986    OB History    Gravida Para Term Preterm AB Living   '1 1 1     1   ' SAB TAB Ectopic Multiple Live Births                   Home Medications    Prior to Admission medications   Medication Sig Start Date End Date Taking? Authorizing Provider  MULTIPLE VITAMIN tablet Take 1 tablet by mouth daily.   Yes [provider]  nabumetone (RELAFEN)  750 MG tablet TAKE 1 TABLET BY MOUTH TWICE A DAY AS NEEDED FOR ARTHRITIS Patient taking differently: TAKE 1 TABLET BY MOUTH DAILY 07/05/17  Yes Biagio Borg, MD  nitrofurantoin (MACRODANTIN) 100 MG capsule Take 1 capsule (100 mg total) by mouth 2 (two) times daily. Patient taking differently: Take 100 mg by mouth at bedtime.  08/04/17  Yes Biagio Borg, MD  pantoprazole (PROTONIX) 40 MG tablet Take 1 tablet (40 mg total) by mouth daily. 05/11/17  Yes Biagio Borg, MD  YUVAFEM 10 MCG TABS vaginal tablet PLACE 1 TABLET (10 MCG TOTAL) VAGINALLY 2 (TWO) TIMES A WEEK. 07/13/17  Yes Regina Eck, CNM    Family History Family History  Problem Relation Age of Onset  . Breast cancer Mother        dx in her last 60's  . Heart disease Sister   . Breast cancer Sister        dx in her late 68's  . Diabetes Brother   . Heart disease Brother        triple bypass  . Diabetes Maternal Aunt   . Diabetes Maternal Uncle   . Cancer Maternal Grandmother        uterine  . Colon cancer Neg Hx   . Rectal cancer Neg Hx   . Esophageal cancer Neg Hx     Social History Social History  Substance Use Topics  . Smoking status: Never Smoker  . Smokeless tobacco: Never Used  . Alcohol use No     Allergies   Amoxicillin and Penicillins   Review of Systems Review of Systems  Cardiovascular: Negative for chest pain.  Gastrointestinal: Negative for abdominal pain.  Musculoskeletal: Positive for arthralgias and joint swelling.  All other systems reviewed and are negative.    Physical Exam Updated Vital Signs BP 122/69   Pulse 68   Temp 98 F (36.7 C) (Oral)   Resp 12   Ht 5' 7.75" (1.721 m)   Wt 105.2 kg (232 lb)   SpO2 100%   BMI 35.54 kg/m   Physical Exam  Constitutional: She is oriented to person, place, and time. She appears well-developed and well-nourished. No distress.  HENT:  Head: Normocephalic and atraumatic.  Mouth/Throat: No oropharyngeal exudate.  Eyes: Pupils are equal,  round, and reactive to light. Conjunctivae are normal.  Neck: Normal range of motion. Neck supple. No JVD present.  Cardiovascular: Normal rate, regular rhythm, normal heart sounds and intact distal pulses.   Pulmonary/Chest: Effort normal and breath sounds normal. She has no wheezes. She has no rales.  Abdominal: Soft. Bowel sounds are normal. She exhibits no mass. There is no tenderness. There is no rebound and no guarding.  Musculoskeletal: She exhibits tenderness and deformity.  Right ankle: She exhibits decreased range of motion, swelling and deformity. Achilles tendon normal.  Neurological: She is alert and oriented to person, place, and time. She displays normal reflexes. She exhibits normal muscle tone. Coordination normal.  Skin: Skin is warm and dry. Capillary refill takes less than 2 seconds.  Psychiatric: She has a normal mood and affect.     ED Treatments / Results  Labs (all labs ordered are listed, but only abnormal results are displayed)  Results for orders placed or performed during the hospital encounter of 66/06/30  Basic metabolic panel  Result Value Ref Range   Sodium 142 135 - 145 mmol/L   Potassium 3.6 3.5 - 5.1 mmol/L   Chloride 108 101 - 111 mmol/L   CO2 26 22 - 32 mmol/L   Glucose, Bld 119 (H) 65 - 99 mg/dL   BUN 22 (H) 6 - 20 mg/dL   Creatinine, Ser 0.96 0.44 - 1.00 mg/dL   Calcium 9.0 8.9 - 10.3 mg/dL   GFR calc non Af Amer >60 >60 mL/min   GFR calc Af Amer >60 >60 mL/min   Anion gap 8 5 - 15  CBC with Differential  Result Value Ref Range   WBC 13.0 (H) 4.0 - 10.5 K/uL   RBC 4.31 3.87 - 5.11 MIL/uL   Hemoglobin 12.7 12.0 - 15.0 g/dL   HCT 38.2 36.0 - 46.0 %   MCV 88.6 78.0 - 100.0 fL   MCH 29.5 26.0 - 34.0 pg   MCHC 33.2 30.0 - 36.0 g/dL   RDW 12.7 11.5 - 15.5 %   Platelets 231 150 - 400 K/uL   Neutrophils Relative % 71 %   Neutro Abs 9.2 (H) 1.7 - 7.7 K/uL   Lymphocytes Relative 23 %   Lymphs Abs 3.0 0.7 - 4.0 K/uL   Monocytes Relative 5  %   Monocytes Absolute 0.6 0.1 - 1.0 K/uL   Eosinophils Relative 1 %   Eosinophils Absolute 0.2 0.0 - 0.7 K/uL   Basophils Relative 0 %   Basophils Absolute 0.0 0.0 - 0.1 K/uL   Dg Ankle Complete Right  Result Date: 08/20/2017 CLINICAL DATA:  Status post reduction of right ankle trimalleolar fracture. Initial encounter. EXAM: RIGHT ANKLE - COMPLETE 3+ VIEW COMPARISON:  Right ankle radiographs performed 08/19/2017 FINDINGS: There is improved alignment of the trimalleolar fracture of the right ankle, with residual superior and posterior displacement of the posterior malleolar fragment. Previously noted subluxation has resolved. An associated splint is noted. A plantar calcaneal spur is seen. No new fractures are identified. IMPRESSION: Improved alignment of trimalleolar fracture of the right ankle. Electronically Signed   By: Garald Balding M.D.   On: 08/20/2017 02:28   Dg Ankle Complete Right  Result Date: 08/19/2017 CLINICAL DATA:  She reports that she missed a step and fell in front of her house. When she fell, she reports twisting her R ankle. Pt states she has generalized right ankle pain that radiates up her ankle. Pt states previous injury to ankle as a child and foot surgery. EXAM: RIGHT ANKLE - COMPLETE 3+ VIEW COMPARISON:  None. FINDINGS: Fracture of the medial malleolus with lateral subluxation of the talus relation to the tibia. There is fracture of the posterior malleolus of the tibia. Fracture of the fibula at the level ankle mortise. Calcaneus appears normal. IMPRESSION: 1. Fracture of the medial malleolus and posterior malleolus of the tibia. 2. Subluxation of the tibia in relation to the talus with disruption of the  tibiofibular syndesmosis 3. Fracture of the fibula at the level of the ankle mortise 4. So-called trimalleolar fracture dislocation. Electronically Signed   By: Suzy Bouchard M.D.   On: 08/19/2017 21:45   Dg Finger Middle Right  Result Date: 08/19/2017 CLINICAL DATA:   66 y/o F; initial encounter with injury to the right little finger. EXAM: RIGHT MIDDLE FINGER 2+V COMPARISON:  None. FINDINGS: There is no evidence of fracture or dislocation. There is no evidence of arthropathy or other focal bone abnormality. Soft tissues are unremarkable. IMPRESSION: Negative. Electronically Signed   By: Kristine Garbe M.D.   On: 08/19/2017 23:18   Dg Foot Complete Left  Result Date: 08/19/2017 CLINICAL DATA:  67 y/o F; a initial encounter with injury to the left foot. EXAM: LEFT FOOT - COMPLETE 3+ VIEW COMPARISON:  None. FINDINGS: Lucency traversing base of fifth metatarsal and lateral cuboid cortex compatible with acute fractures. Additionally, there are small bony bodies dorsal to talus and navicular bone possibly representing avulsion fractures, correlate for focal tenderness. Plantar calcaneal bone spur. IMPRESSION: 1. Lucency traversing base of fifth metatarsal and lateral cuboid cortex compatible with acute fractures. 2. Small bony bodies dorsal to talus and navicular bone possibly representing avulsion fractures, correlate for focal tenderness. Electronically Signed   By: Kristine Garbe M.D.   On: 08/19/2017 23:24    EKG  EKG Interpretation None       Radiology Dg Ankle Complete Right  Result Date: 08/20/2017 CLINICAL DATA:  Status post reduction of right ankle trimalleolar fracture. Initial encounter. EXAM: RIGHT ANKLE - COMPLETE 3+ VIEW COMPARISON:  Right ankle radiographs performed 08/19/2017 FINDINGS: There is improved alignment of the trimalleolar fracture of the right ankle, with residual superior and posterior displacement of the posterior malleolar fragment. Previously noted subluxation has resolved. An associated splint is noted. A plantar calcaneal spur is seen. No new fractures are identified. IMPRESSION: Improved alignment of trimalleolar fracture of the right ankle. Electronically Signed   By: Garald Balding M.D.   On: 08/20/2017 02:28     Dg Ankle Complete Right  Result Date: 08/19/2017 CLINICAL DATA:  She reports that she missed a step and fell in front of her house. When she fell, she reports twisting her R ankle. Pt states she has generalized right ankle pain that radiates up her ankle. Pt states previous injury to ankle as a child and foot surgery. EXAM: RIGHT ANKLE - COMPLETE 3+ VIEW COMPARISON:  None. FINDINGS: Fracture of the medial malleolus with lateral subluxation of the talus relation to the tibia. There is fracture of the posterior malleolus of the tibia. Fracture of the fibula at the level ankle mortise. Calcaneus appears normal. IMPRESSION: 1. Fracture of the medial malleolus and posterior malleolus of the tibia. 2. Subluxation of the tibia in relation to the talus with disruption of the tibiofibular syndesmosis 3. Fracture of the fibula at the level of the ankle mortise 4. So-called trimalleolar fracture dislocation. Electronically Signed   By: Suzy Bouchard M.D.   On: 08/19/2017 21:45   Dg Finger Middle Right  Result Date: 08/19/2017 CLINICAL DATA:  66 y/o F; initial encounter with injury to the right little finger. EXAM: RIGHT MIDDLE FINGER 2+V COMPARISON:  None. FINDINGS: There is no evidence of fracture or dislocation. There is no evidence of arthropathy or other focal bone abnormality. Soft tissues are unremarkable. IMPRESSION: Negative. Electronically Signed   By: Kristine Garbe M.D.   On: 08/19/2017 23:18   Dg Foot Complete Left  Result  Date: 08/19/2017 CLINICAL DATA:  66 y/o F; a initial encounter with injury to the left foot. EXAM: LEFT FOOT - COMPLETE 3+ VIEW COMPARISON:  None. FINDINGS: Lucency traversing base of fifth metatarsal and lateral cuboid cortex compatible with acute fractures. Additionally, there are small bony bodies dorsal to talus and navicular bone possibly representing avulsion fractures, correlate for focal tenderness. Plantar calcaneal bone spur. IMPRESSION: 1. Lucency  traversing base of fifth metatarsal and lateral cuboid cortex compatible with acute fractures. 2. Small bony bodies dorsal to talus and navicular bone possibly representing avulsion fractures, correlate for focal tenderness. Electronically Signed   By: Kristine Garbe M.D.   On: 08/19/2017 23:24    Procedures .Sedation Date/Time: 08/20/2017 2:41 AM Performed by: Veatrice Kells Authorized by: Veatrice Kells   Consent:    Consent obtained:  Written   Consent given by:  Patient   Risks discussed:  Allergic reaction, dysrhythmia and prolonged hypoxia resulting in organ damage   Alternatives discussed:  Regional anesthesia Universal protocol:    Procedure explained and questions answered to patient or proxy's satisfaction: yes     Relevant documents present and verified: yes     Test results available and properly labeled: yes     Imaging studies available: yes     Site/side marked: yes     Immediately prior to procedure a time out was called: yes     Patient identity confirmation method:  Arm band Indications:    Procedure performed:  Dislocation reduction   Procedure necessitating sedation performed by:  Different physician   Intended level of sedation:  Moderate (conscious sedation) Pre-sedation assessment:    Time since last food or drink:  6 hourse   ASA classification: class 2 - patient with mild systemic disease     Neck mobility: normal     Mouth opening:  3 or more finger widths   Mallampati score:  IV - only hard palate visible Immediate pre-procedure details:    Reassessment: Patient reassessed immediately prior to procedure     Reviewed: vital signs     Verified: bag valve mask available, oxygen available and suction available   Procedure details (see MAR for exact dosages):    Preoxygenation:  Nasal cannula   Sedation:  Propofol   Intra-procedure monitoring:  Blood pressure monitoring, continuous capnometry, frequent LOC assessments, frequent vital sign checks,  continuous pulse oximetry and cardiac monitor   Intra-procedure events: none     Total Provider sedation time (minutes):  15 Post-procedure details:    Post-sedation assessment completed:  08/20/2017 2:43 AM   Post-sedation assessments completed and reviewed: airway patency, cardiovascular function, hydration status, mental status, nausea/vomiting, pain level and respiratory function     Patient is stable for discharge or admission: yes     Patient tolerance:  Tolerated well, no immediate complications   (including critical care time)  Medications Ordered in ED Medications  HYDROmorphone (DILAUDID) injection 1 mg (not administered)  ondansetron (ZOFRAN) injection 4 mg (not administered)  0.9 %  sodium chloride infusion ( Intravenous New Bag/Given 08/20/17 0140)  morphine 4 MG/ML injection 4 mg (4 mg Intravenous Given 08/19/17 2213)  ondansetron (ZOFRAN) injection 4 mg (4 mg Intravenous Given 08/19/17 2213)  morphine 4 MG/ML injection 4 mg (4 mg Intravenous Given 08/19/17 2329)  HYDROmorphone (DILAUDID) injection 0.5 mg (0.5 mg Intravenous Given 08/20/17 0114)  propofol (DIPRIVAN) 10 mg/mL bolus/IV push 52.6 mg (52.6 mg Intravenous Given 08/20/17 0145)  0.9 %  sodium chloride infusion (1,000 mLs  Intravenous New Bag/Given 08/20/17 0140)  propofol (DIPRIVAN) 10 mg/mL bolus/IV push (52 mg Intravenous Given 08/20/17 0153)      Final Clinical Impressions(s) / ED Diagnoses   Final diagnoses:  Closed trimalleolar fracture of right ankle, initial encounter  Nondisplaced fracture of fifth metatarsal bone, left foot, initial encounter for closed fracture  Pain of right middle finger   Admitted to orthopedics New Prescriptions New Prescriptions   No medications on file     Dorina Ribaudo, MD 08/20/17 0244

## 2017-08-20 NOTE — Anesthesia Postprocedure Evaluation (Signed)
Anesthesia Post Note  Patient: [name]  Procedure(s) Performed: OPEN REDUCTION INTERNAL FIXATION (ORIF) ANKLE FRACTURE (Right Ankle)     Patient location during evaluation: PACU Anesthesia Type: General and Regional Level of consciousness: awake and alert Pain management: pain level controlled Vital Signs Assessment: post-procedure vital signs reviewed and stable Respiratory status: spontaneous breathing, nonlabored ventilation, respiratory function stable and patient connected to nasal cannula oxygen Cardiovascular status: blood pressure returned to baseline and stable Postop Assessment: no apparent nausea or vomiting Anesthetic complications: no    Last Vitals:  Vitals:   08/20/17 1545 08/20/17 1600  BP: 115/73 129/73  Pulse: 78 88  Resp: 10 12  Temp: 36.8 C   SpO2: 100% 97%    Last Pain:  Vitals:   08/20/17 1545  TempSrc:   PainSc: 0-No pain                 Saylor Murry,W. EDMOND

## 2017-08-20 NOTE — Anesthesia Procedure Notes (Signed)
Anesthesia Regional Block: Popliteal block   Pre-Anesthetic Checklist: ,, timeout performed, Correct Patient, Correct Site, Correct Laterality, Correct Procedure, Correct Position, site marked, Risks and benefits discussed, pre-op evaluation,  At surgeon's request and post-op pain management  Laterality: Right  Prep: Maximum Sterile Barrier Precautions used, chloraprep       Needles:  Injection technique: Single-shot  Needle Type: Echogenic Stimulator Needle     Needle Length: 9cm  Needle Gauge: 21     Additional Needles:   Procedures:, nerve stimulator,,, ultrasound used (permanent image in chart),,,,   Nerve Stimulator or Paresthesia:  Response: Peroneal,  Response: Tibial,   Additional Responses:   Narrative:  Start time: 08/20/2017 11:22 AM End time: 08/20/2017 11:32 AM Injection made incrementally with aspirations every 5 mL. Anesthesiologist: Roderic Palau  Additional Notes: 2% Lidocaine skin wheel. Saphenous block with 10cc of 0.5% Bupivicaine plain.

## 2017-08-20 NOTE — Progress Notes (Signed)
Right and left lower extremities positioned and aligned and elevated per orders

## 2017-08-20 NOTE — Anesthesia Procedure Notes (Signed)
Procedure Name: LMA Insertion Performed by: Gean Maidens Pre-anesthesia Checklist: Patient identified, Emergency Drugs available, Suction available, Patient being monitored and Timeout performed Patient Re-evaluated:Patient Re-evaluated prior to induction Oxygen Delivery Method: Circle system utilized Preoxygenation: Pre-oxygenation with 100% oxygen Induction Type: IV induction Ventilation: Mask ventilation without difficulty LMA: LMA inserted LMA Size: 4.0 Number of attempts: 1 Placement Confirmation: CO2 detector,  breath sounds checked- equal and bilateral and positive ETCO2 Tube secured with: Tape Dental Injury: Teeth and Oropharynx as per pre-operative assessment

## 2017-08-20 NOTE — Transfer of Care (Signed)
Immediate Anesthesia Transfer of Care Note  Patient: [name]  Procedure(s) Performed: OPEN REDUCTION INTERNAL FIXATION (ORIF) ANKLE FRACTURE (Right Ankle)  Patient Location: PACU  Anesthesia Type:General  Level of Consciousness: awake, alert  and oriented  Airway & Oxygen Therapy: Patient Spontanous Breathing and Patient connected to face mask oxygen  Post-op Assessment: Report given to RN and Post -op Vital signs reviewed and stable  Post vital signs: Reviewed and stable  Last Vitals:  Vitals:   08/20/17 1513 08/20/17 1515  BP: 140/76 136/73  Pulse: (!) 105 99  Resp:  15  Temp:  (P) 37 C  SpO2: 97% 98%    Last Pain:  Vitals:   08/20/17 1515  TempSrc:   PainSc: (P) 0-No pain         Complications: No apparent anesthesia complications

## 2017-08-20 NOTE — Anesthesia Preprocedure Evaluation (Addendum)
Anesthesia Evaluation  Patient identified by MRN, date of birth, ID band Patient awake    Reviewed: Allergy & Precautions, H&P , NPO status , Patient's Chart, lab work & pertinent test results  History of Anesthesia Complications (+) PONV  Airway Mallampati: II  TM Distance: >3 FB Neck ROM: Full    Dental no notable dental hx. (+) Teeth Intact, Dental Advisory Given   Pulmonary neg pulmonary ROS,    Pulmonary exam normal breath sounds clear to auscultation       Cardiovascular negative cardio ROS   Rhythm:Regular Rate:Normal     Neuro/Psych negative neurological ROS  negative psych ROS   GI/Hepatic Neg liver ROS, GERD  Medicated and Controlled,  Endo/Other  negative endocrine ROS  Renal/GU negative Renal ROS  negative genitourinary   Musculoskeletal  (+) Arthritis , Osteoarthritis,    Abdominal   Peds  Hematology negative hematology ROS (+)   Anesthesia Other Findings   Reproductive/Obstetrics negative OB ROS                            Anesthesia Physical Anesthesia Plan  ASA: II  Anesthesia Plan: General   Post-op Pain Management:  Regional for Post-op pain   Induction: Intravenous  PONV Risk Score and Plan: 4 or greater and Ondansetron, Dexamethasone, Midazolam and Scopolamine patch - Pre-op  Airway Management Planned: LMA  Additional Equipment:   Intra-op Plan:   Post-operative Plan: Extubation in OR  Informed Consent: I have reviewed the patients History and Physical, chart, labs and discussed the procedure including the risks, benefits and alternatives for the proposed anesthesia with the patient or authorized representative who has indicated his/her understanding and acceptance.   Dental advisory given  Plan Discussed with: CRNA  Anesthesia Plan Comments:         Anesthesia Quick Evaluation

## 2017-08-21 ENCOUNTER — Encounter (HOSPITAL_COMMUNITY): Payer: Self-pay | Admitting: Orthopedic Surgery

## 2017-08-21 MED ORDER — OXYCODONE HCL 5 MG PO TABS: 5.0000 mg | ORAL_TABLET | ORAL | 0 refills | Status: DC | PRN

## 2017-08-21 MED ORDER — RIVAROXABAN 10 MG PO TABS: 10.0000 mg | ORAL_TABLET | Freq: Every day | ORAL | 0 refills | Status: DC

## 2017-08-21 MED ORDER — METHOCARBAMOL 500 MG PO TABS: 500.0000 mg | ORAL_TABLET | Freq: Four times a day (QID) | ORAL | 0 refills | Status: DC | PRN

## 2017-08-21 NOTE — Progress Notes (Signed)
Physical Therapy Treatment Patient Details Name: [name] MRN: [id number] DOB: [birth year] Today's Date: 08/21/2017    History of Present Illness Pt s/p fall.  R Trimalleolar ankle fracture s/p open reduction and internal fixation. Pt is NWB on  R; L ankle sprain with  5th metatarsal fx, small avulsion fx's    PT Comments    Assisted pt OOB to practice using a knee scooter.  See mobility details below.   Follow Up Recommendations  Home health PT;Supervision for mobility/OOB     Equipment Recommendations  Rolling walker with 5" wheels    Recommendations for Other Services       Precautions / Restrictions Precautions Precautions: Fall Precaution Comments: post op shoe L --ok to wear athletic shoe Required Braces or Orthoses: Other Brace/Splint Other Brace/Splint: R LE ACE cast Restrictions Weight Bearing Restrictions: Yes RLE Weight Bearing: Non weight bearing LLE Weight Bearing: Weight bearing as tolerated    Mobility  Bed Mobility Overal bed mobility: Needs Assistance Bed Mobility: Supine to Sit;Sit to Supine     Supine to sit: Min assist;Min guard Sit to supine: Min assist   General bed mobility comments: increased time  Transfers Overall transfer level: Needs assistance Equipment used: None Transfers: Stand Pivot Transfers Sit to Stand: Min guard;Min assist         General transfer comment: 50% VC's on proper tech and hand placement while NWB R LE  Ambulation/Gait       General Gait Details: using knee scooter pt traveled 30 feet with Min Assist and 75% VC's on safety with turns and negociating around obsticles   Stairs            Wheelchair Mobility    Modified Rankin (Stroke Patients Only)       Balance Overall balance assessment: Needs assistance   Sitting balance-Leahy Scale: Good     Standing balance support: Single extremity supported;During functional activity Standing balance-Leahy Scale: Poor Standing balance comment:  reliant on UE support/able to maintain NWB on R                            Cognition Arousal/Alertness: Awake/alert Behavior During Therapy: WFL for tasks assessed/performed Overall Cognitive Status: Within Functional Limits for tasks assessed                                        Exercises      General Comments        Pertinent Vitals/Pain Pain Assessment: Faces Pain Score: 4  Faces Pain Scale: Hurts little more Pain Location: L lateral ankle Pain Descriptors / Indicators: Aching Pain Intervention(s): Monitored during session;Repositioned;Ice applied    Home Living Family/patient expects to be discharged to:: Private residence Living Arrangements: Spouse/significant other Available Help at Discharge: Family   Home Access: Stairs to enter   Home Layout: One level Home Equipment: Transport chair Additional Comments: have a walker - unsure if wheels    Prior Function Level of Independence: Independent          PT Goals (current goals can now be found in the care plan section) Acute Rehab PT Goals Patient Stated Goal: home with husband  PT Goal Formulation: With patient Time For Goal Achievement: 08/28/17 Potential to Achieve Goals: Good Progress towards PT goals: Progressing toward goals    Frequency    7X/week  PT Plan Current plan remains appropriate    Co-evaluation              AM-PAC PT "6 Clicks" Daily Activity  Outcome Measure  Difficulty turning over in bed (including adjusting bedclothes, sheets and blankets)?: Unable Difficulty moving from lying on back to sitting on the side of the bed? : Unable Difficulty sitting down on and standing up from a chair with arms (e.g., wheelchair, bedside commode, etc,.)?: Unable Help needed moving to and from a bed to chair (including a wheelchair)?: A Lot Help needed walking in hospital room?: A Lot Help needed climbing 3-5 steps with a railing? : Total 6 Click Score:  8    End of Session Equipment Utilized During Treatment: Gait belt Activity Tolerance: Patient limited by fatigue Patient left: in bed;with call bell/phone within reach;with family/visitor present Nurse Communication: Mobility status PT Visit Diagnosis: Muscle weakness (generalized) (M62.81);Difficulty in walking, not elsewhere classified (R26.2);Unsteadiness on feet (R26.81)     Time: 1355-1420 PT Time Calculation (min) (ACUTE ONLY): 25 min  Charges:  $Gait Training: 8-22 mins $Therapeutic Activity: 8-22 mins                    G Codes:  Functional Assessment Tool Used: AM-PAC 6 Clicks Basic Mobility;Clinical judgement Functional Limitation: Mobility: Walking and moving around Mobility: Walking and Moving Around Current Status (B1660): At least 20 percent but less than 40 percent impaired, limited or restricted Mobility: Walking and Moving Around Goal Status [phone]): At least 1 percent but less than 20 percent impaired, limited or restricted    Rica Koyanagi  PTA [facility]  Acute  Rehab Pager      [phone]

## 2017-08-21 NOTE — Evaluation (Signed)
Occupational Therapy Evaluation Patient Details Name: [name] MRN: [id number] DOB: [birth year] Today's Date: 08/21/2017    History of Present Illness Pt s/p fall.  Trimalleolar ankle fracture open reduction and internal fixation. Pt is NWB   Clinical Impression   Pt admitted with R ankle fracture. Pt currently with functional limitations due to the deficits listed below (see OT Problem List).  Pt will benefit from skilled OT to increase their safety and independence with ADL and functional mobility for ADL to facilitate discharge to venue listed below.      Follow Up Recommendations  No OT follow up    Equipment Recommendations  Tub/shower bench;3 in 1 bedside commode       Precautions / Restrictions Restrictions Weight Bearing Restrictions: Yes RLE Weight Bearing: Non weight bearing      Mobility Bed Mobility Overal bed mobility: Needs Assistance Bed Mobility: Supine to Sit     Supine to sit: Min assist        Transfers Overall transfer level: Needs assistance Equipment used: Rolling walker (2 wheeled) Transfers: Sit to/from [facility] Sit to Stand: Min assist Stand pivot transfers: Mod assist                ADL either performed or assessed with clinical judgement   ADL Overall ADL's : Needs assistance/impaired Eating/Feeding: Set up;Sitting   Grooming: Set up;Sitting   Upper Body Bathing: Set up;Sitting   Lower Body Bathing: Moderate assistance;Sit to/from stand   Upper Body Dressing : Set up;Sitting   Lower Body Dressing: Moderate assistance;Sit to/from stand;Cueing for safety;Cueing for sequencing   Toilet Transfer: Moderate assistance;RW;Ambulation;Cueing for sequencing;Cueing for safety   Toileting- Clothing Manipulation and Hygiene: Moderate assistance;Sit to/from stand;Cueing for safety;Cueing for sequencing     Tub/Shower Transfer Details (indicate cue type and reason): educated pt on tub bench   General ADL  Comments: husband will A as needed     Vision Patient Visual Report: No change from baseline              Pertinent Vitals/Pain Pain Assessment: 0-10 Pain Score: 6      Hand Dominance     Extremity/Trunk Assessment Upper Extremity Assessment Upper Extremity Assessment: Overall WFL for tasks assessed           Communication Communication Communication: No difficulties   Cognition Arousal/Alertness: Awake/alert Behavior During Therapy: WFL for tasks assessed/performed Overall Cognitive Status: Within Functional Limits for tasks assessed                                                Home Living Family/patient expects to be discharged to:: Private residence Living Arrangements: Spouse/significant other Available Help at Discharge: Family Type of Home: House Home Access: Stairs to enter Technical brewer of Steps: 3   Home Layout: One level     Bathroom Shower/Tub: Tub/shower unit;Walk-in shower   Bathroom Toilet: Handicapped height         Additional Comments: have a walker - unsure if wheels      Prior Functioning/Environment Level of Independence: Independent                 OT Problem List: Decreased strength;Decreased knowledge of use of DME or AE;Decreased activity tolerance;Decreased safety awareness;Decreased knowledge of precautions      OT Treatment/Interventions: Self-care/ADL training;Patient/family education    OT Goals(Current  goals can be found in the care plan section) Acute Rehab OT Goals Patient Stated Goal: home with husband  OT Goal Formulation: With patient Time For Goal Achievement: 08/28/17 Potential to Achieve Goals: Good  OT Frequency: Min 2X/week   Barriers to D/C:               AM-PAC PT "6 Clicks" Daily Activity     Outcome Measure Help from another person eating meals?: None Help from another person taking care of personal grooming?: None Help from another person toileting, which  includes using toliet, bedpan, or urinal?: A Lot Help from another person bathing (including washing, rinsing, drying)?: A Little Help from another person to put on and taking off regular upper body clothing?: None Help from another person to put on and taking off regular lower body clothing?: A Lot 6 Click Score: 19   End of Session Equipment Utilized During Treatment: Rolling walker Nurse Communication: Mobility status  Activity Tolerance: Patient tolerated treatment well Patient left: in chair;with call bell/phone within reach;with family/visitor present  OT Visit Diagnosis: Unsteadiness on feet (R26.81);Pain;History of falling (Z91.81) Pain - Right/Left: Right Pain - part of body: Ankle and joints of foot                Time: 0949-1010 OT Time Calculation (min): 21 min Charges:  OT General Charges $OT Visit: 1 Visit OT Evaluation $OT Eval Moderate Complexity: 1 Mod G-Codes: OT G-codes **NOT FOR INPATIENT CLASS** Functional Assessment Tool Used: Clinical judgement Functional Limitation: Self care Self Care Current Status (V2536): At least 40 percent but less than 60 percent impaired, limited or restricted Self Care Goal Status (U4403): At least 1 percent but less than 20 percent impaired, limited or restricted   Kari Baars, [place]  Payton Mccallum D 08/21/2017, 10:56 AM

## 2017-08-21 NOTE — Progress Notes (Signed)
Subjective: Pt stable - pain ok   Objective: Vital signs in last 24 hours: Temp:  [97.5 F (36.4 C)-98.6 F (37 C)] 97.5 F (36.4 C) (10/15 1040) Pulse Rate:  [65-105] 70 (10/15 1040) Resp:  [9-18] 18 (10/15 1040) BP: (105-140)/(51-78) 115/57 (10/15 1040) SpO2:  [96 %-100 %] 96 % (10/15 1040)  Intake/Output from previous day: 10/14 0701 - 10/15 0700 In: 2440.5 [P.O.:280; I.V.:2110.5; IV Piggyback:50] Out: 950 [Urine:900; Blood:50] Intake/Output this shift: Total I/O In: 240 [P.O.:240] Out: 350 [Urine:350]  Exam:  Dorsiflexion/Plantar flexion intact  Labs:  Recent Labs  08/19/17 2205  HGB 12.7    Recent Labs  08/19/17 2205  WBC 13.0*  RBC 4.31  HCT 38.2  PLT 231    Recent Labs  08/19/17 2205  NA 142  K 3.6  CL 108  CO2 26  BUN 22*  CREATININE 0.96  GLUCOSE 119*  CALCIUM 9.0   No results for input(s): LABPT, INR in the last 72 hours.  Assessment/Plan: Plan dc today with scooter if ok with PT - ok for hard sole tennis shoe on left   G Alphonzo Severance 08/21/2017, 11:31 AM

## 2017-08-21 NOTE — Evaluation (Signed)
Physical Therapy Evaluation Patient Details Name: [name] MRN: [id number] DOB: Oct 03, 1951 Today's Date: 08/21/2017   History of Present Illness  Pt s/p fall.  R Trimalleolar ankle fracture s/p open reduction and internal fixation. Pt is NWB on  R; L ankle sprain with  5th metatarsal fx, small avulsion fx's  Clinical Impression  Pt admitted with above diagnosis. Pt currently with functional limitations due to the deficits listed below (see PT Problem List). *Pt will benefit from skilled PT to increase their independence and safety with mobility to allow discharge to the venue listed below.   Pt limited by balance, endurance and pain--not ready for D/C home today; will attempt to see again today and hopefully will be ready for D/C tomorrow; discussed with MD and RN      Follow Up Recommendations Home health PT;Supervision for mobility/OOB    Equipment Recommendations  Rolling walker with 5" wheels    Recommendations for Other Services       Precautions / Restrictions Precautions Precautions: Fall Precaution Comments: post op shoe L --ok to wear athletic shoe Required Braces or Orthoses: Other Brace/Splint Restrictions Weight Bearing Restrictions: Yes RLE Weight Bearing: Non weight bearing LLE Weight Bearing: Weight bearing as tolerated      Mobility  Bed Mobility Overal bed mobility: Needs Assistance Bed Mobility: Supine to Sit     Supine to sit: Min assist     General bed mobility comments: OOB with OT  Transfers Overall transfer level: Needs assistance Equipment used: Rolling walker (2 wheeled) Transfers: Sit to/from Stand Sit to Stand: Min assist Stand pivot transfers: Mod assist       General transfer comment: repeated x2 d/t fatigue/activity tol; cues for hand placement, able to maintain NWB but heavy use of UEs  Ambulation/Gait Ambulation/Gait assistance: Min assist;Mod assist Ambulation Distance (Feet): 3 Feet Assistive device: Rolling walker (2  wheeled)       General Gait Details: assist for balance, cues for sequence and use of UEs; pt has difficulty FWB on L ankle d/t injuries and pain  Stairs            Wheelchair Mobility    Modified Rankin (Stroke Patients Only)       Balance Overall balance assessment: Needs assistance   Sitting balance-Leahy Scale: Good     Standing balance support: Single extremity supported;During functional activity Standing balance-Leahy Scale: Poor Standing balance comment: reliant on UE support/able to maintain NWB on R                             Pertinent Vitals/Pain Pain Assessment: 0-10 Pain Score: 4  Pain Location: ankles Pain Descriptors / Indicators: Aching Pain Intervention(s): Limited activity within patient's tolerance;Monitored during session;Premedicated before session    Home Living Family/patient expects to be discharged to:: Private residence Living Arrangements: Spouse/significant other Available Help at Discharge: Family Type of Home: House Home Access: Stairs to enter   Technical brewer of Steps: 3 Home Layout: One level Home Equipment: Transport chair Additional Comments: have a walker - unsure if wheels    Prior Function Level of Independence: Independent               Hand Dominance        Extremity/Trunk Assessment   Upper Extremity Assessment Upper Extremity Assessment: Overall WFL for tasks assessed    Lower Extremity Assessment Lower Extremity Assessment: RLE deficits/detail;LLE deficits/detail RLE Deficits / Details: knee AROM grossly [facility]  RLE: Unable to fully assess due to immobilization LLE Deficits / Details: ankle NT d/t fx's/pain; knee and hip grossly WFL--although functional quad/HS/glut weakness noted with standing        Communication   Communication: No difficulties  Cognition Arousal/Alertness: Awake/alert Behavior During Therapy: WFL for tasks assessed/performed Overall Cognitive Status: Within  Functional Limits for tasks assessed                                        General Comments      Exercises     Assessment/Plan    PT Assessment Patient needs continued PT services  PT Problem List Decreased strength;Decreased activity tolerance;Decreased mobility;Pain;Decreased knowledge of use of DME;Decreased balance       PT Treatment Interventions DME instruction    PT Goals (Current goals can be found in the Care Plan section)  Acute Rehab PT Goals Patient Stated Goal: home with husband  PT Goal Formulation: With patient Time For Goal Achievement: 08/28/17 Potential to Achieve Goals: Good    Frequency 7X/week   Barriers to discharge        Co-evaluation               AM-PAC PT "6 Clicks" Daily Activity  Outcome Measure Difficulty turning over in bed (including adjusting bedclothes, sheets and blankets)?: Unable Difficulty moving from lying on back to sitting on the side of the bed? : Unable Difficulty sitting down on and standing up from a chair with arms (e.g., wheelchair, bedside commode, etc,.)?: Unable Help needed moving to and from a bed to chair (including a wheelchair)?: A Lot Help needed walking in hospital room?: A Lot Help needed climbing 3-5 steps with a railing? : Total 6 Click Score: 8    End of Session Equipment Utilized During Treatment: Gait belt Activity Tolerance: Patient limited by fatigue Patient left: in chair;with call bell/phone within reach   PT Visit Diagnosis: Muscle weakness (generalized) (M62.81);Difficulty in walking, not elsewhere classified (R26.2);Unsteadiness on feet (R26.81)    Time: 7867-6720 PT Time Calculation (min) (ACUTE ONLY): 34 min   Charges:   PT Evaluation $PT Eval Low Complexity: 1 Low PT Treatments $Gait Training: 8-22 mins   PT G Codes:   PT G-Codes **NOT FOR INPATIENT CLASS** Functional Assessment Tool Used: AM-PAC 6 Clicks Basic Mobility;Clinical judgement Functional Limitation:  Mobility: Walking and moving around Mobility: Walking and Moving Around Current Status (N4709): At least 20 percent but less than 40 percent impaired, limited or restricted Mobility: Walking and Moving Around Goal Status [phone]): At least 1 percent but less than 20 percent impaired, limited or restricted      [facility] 08/21/2017, 12:07 PM

## 2017-08-21 NOTE — Progress Notes (Signed)
Patient stable.  We need to keep her 1 more day because she did not doing quite well enough with physical therapy to be discharged today

## 2017-08-22 NOTE — Progress Notes (Signed)
Occupational Therapy Treatment Patient Details Name: [name] MRN: [id number] DOB: 1951-04-30 Today's Date: 08/22/2017    History of present illness Pt s/p fall.  R Trimalleolar ankle fracture s/p open reduction and internal fixation. Pt is NWB on  R; L ankle sprain with  5th metatarsal fx, small avulsion fx's   OT comments   pts husband will A as needed  Follow Up Recommendations  No OT follow up    Equipment Recommendations  Tub/shower bench;3 in 1 bedside commode       Precautions / Restrictions Precautions Precautions: Fall Precaution Comments: post op shoe L --ok to wear athletic shoe Required Braces or Orthoses: Other Brace/Splint Other Brace/Splint: R LE ACE cast Restrictions Weight Bearing Restrictions: Yes RLE Weight Bearing: Non weight bearing LLE Weight Bearing: Weight bearing as tolerated       Mobility Bed Mobility Overal bed mobility: Needs Assistance Bed Mobility: Supine to Sit     Supine to sit: Min guard     General bed mobility comments: increased time  Transfers Overall transfer level: Needs assistance Equipment used: None;Rolling walker (2 wheeled) Transfers: Stand Pivot Transfers Sit to Stand: Min guard Stand pivot transfers: Min guard            Balance Overall balance assessment: Needs assistance   Sitting balance-Leahy Scale: Good     Standing balance support: Single extremity supported;During functional activity Standing balance-Leahy Scale: Poor Standing balance comment: reliant on UE support/able to maintain NWB on R                           ADL either performed or assessed with clinical judgement   ADL Overall ADL's : Needs assistance/impaired Eating/Feeding: Set up;Sitting               Upper Body Dressing : Set up;Sitting   Lower Body Dressing: Minimal assistance;Sit to/from stand;Cueing for sequencing;Cueing for safety   Toilet Transfer: Min guard;RW;Ambulation;BSC   Toileting- Clothing  Manipulation and Hygiene: Minimal assistance;Sit to/from stand;Cueing for safety;Cueing for sequencing     Tub/Shower Transfer Details (indicate cue type and reason): educated pt on tub bench- niece wil obtain for pt   General ADL Comments: pt will utilize transport chair  in house from couch to bathroom     Vision Patient Visual Report: No change from baseline            Cognition Arousal/Alertness: Awake/alert Behavior During Therapy: WFL for tasks assessed/performed Overall Cognitive Status: Within Functional Limits for tasks assessed                                                     Pertinent Vitals/ Pain       Pain Score: 2  Pain Location: L lateral ankle Pain Descriptors / Indicators: Dull;Discomfort Pain Intervention(s): Limited activity within patient's tolerance;Repositioned         Frequency  Min 2X/week        Progress Toward Goals  OT Goals(current goals can now be found in the care plan section)  Progress towards OT goals: Progressing toward goals            AM-PAC PT "6 Clicks" Daily Activity     Outcome Measure   Help from another person eating meals?: None Help from another person taking care  of personal grooming?: None Help from another person toileting, which includes using toliet, bedpan, or urinal?: A Lot Help from another person bathing (including washing, rinsing, drying)?: A Little Help from another person to put on and taking off regular upper body clothing?: None Help from another person to put on and taking off regular lower body clothing?: A Lot 6 Click Score: 19    End of Session Equipment Utilized During Treatment: Rolling walker  OT Visit Diagnosis: Unsteadiness on feet (R26.81);Pain;History of falling (Z91.81) Pain - Right/Left: Right Pain - part of body: Ankle and joints of foot   Activity Tolerance Patient tolerated treatment well   Patient Left in chair;with call bell/phone within reach;with  family/visitor present   Nurse Communication Mobility status    Functional Assessment Tool Used: Clinical judgement Functional Limitation: Self care Self Care Current Status (I4580): At least 20 percent but less than 40 percent impaired, limited or restricted Self Care Goal Status (D9833): At least 1 percent but less than 20 percent impaired, limited or restricted   Time: 8250-5397 OT Time Calculation (min): 14 min  Charges: OT G-codes **NOT FOR INPATIENT CLASS** Functional Assessment Tool Used: Clinical judgement Functional Limitation: Self care Self Care Current Status (Q7341): At least 20 percent but less than 40 percent impaired, limited or restricted Self Care Goal Status (P3790): At least 1 percent but less than 20 percent impaired, limited or restricted OT General Charges $OT Visit: 1 Visit OT Treatments $Self Care/Home Management : 8-22 mins  Louisa, [place]   Betsy Pries 08/22/2017, 11:13 AM

## 2017-08-22 NOTE — Progress Notes (Signed)
Physical Therapy Treatment Patient Details Name: [name] MRN: [id number] DOB: 11/11/1950 Today's Date: 08/22/2017    History of Present Illness Pt s/p fall.  R Trimalleolar ankle fracture s/p open reduction and internal fixation. Pt is NWB on  R; L ankle sprain with  5th metatarsal fx, small avulsion fx's    PT Comments    Pt D/C to home today.  Instructed on car transfer, tub transfer, R LE positioning(elevation), use of ICE, TE's (pencil toe squeezes, knee presses) and addressed questions concerning activity tolerance once home and walker safety use.     Follow Up Recommendations  Home health PT;Supervision for mobility/OOB     Equipment Recommendations  Rolling walker with 5" wheels    Recommendations for Other Services       Precautions / Restrictions Precautions Precautions: Fall Precaution Comments: post op shoe L --ok to wear athletic shoe Required Braces or Orthoses: Other Brace/Splint Other Brace/Splint: R LE ACE cast Restrictions Weight Bearing Restrictions: Yes RLE Weight Bearing: Non weight bearing LLE Weight Bearing: Weight bearing as tolerated       Balance Overall balance assessment: Needs assistance   Sitting balance-Leahy Scale: Good     Standing balance support: Single extremity supported;During functional activity Standing balance-Leahy Scale: Poor Standing balance comment: reliant on UE support/able to maintain NWB on R                            Cognition Arousal/Alertness: Awake/alert Behavior During Therapy: WFL for tasks assessed/performed Overall Cognitive Status: Within Functional Limits for tasks assessed                                        Exercises      General Comments        Pertinent Vitals/Pain Pain Assessment: No/denies pain Pain Score: 2  Pain Location: L lateral ankle Pain Descriptors / Indicators: Dull;Discomfort Pain Intervention(s): Ice applied    Home Living                      Prior Function            PT Goals (current goals can now be found in the care plan section) Progress towards PT goals: Progressing toward goals    Frequency    7X/week      PT Plan Current plan remains appropriate    Co-evaluation              AM-PAC PT "6 Clicks" Daily Activity  Outcome Measure  Difficulty turning over in bed (including adjusting bedclothes, sheets and blankets)?: Unable Difficulty moving from lying on back to sitting on the side of the bed? : Unable Difficulty sitting down on and standing up from a chair with arms (e.g., wheelchair, bedside commode, etc,.)?: Unable Help needed moving to and from a bed to chair (including a wheelchair)?: A Lot Help needed walking in hospital room?: A Lot Help needed climbing 3-5 steps with a railing? : Total 6 Click Score: 8    End of Session     Patient left: in chair;with family/visitor present Nurse Communication:  (Pt ready for D/C to home) PT Visit Diagnosis: Muscle weakness (generalized) (M62.81);Difficulty in walking, not elsewhere classified (R26.2);Unsteadiness on feet (R26.81)     Time: 2297-9892 PT Time Calculation (min) (ACUTE ONLY): 13 min  Charges:  $Self  Care/Home Management: 8-22                    G Codes:       Rica Koyanagi  PTA [facility]  Acute  Rehab Pager      [phone]

## 2017-08-23 ENCOUNTER — Telehealth (INDEPENDENT_AMBULATORY_CARE_PROVIDER_SITE_OTHER): Payer: Self-pay | Admitting: Orthopedic Surgery

## 2017-08-23 MED ORDER — BACLOFEN 10 MG PO TABS: 10.0000 mg | ORAL_TABLET | Freq: Three times a day (TID) | ORAL | 0 refills | Status: DC

## 2017-08-23 NOTE — Discharge Summary (Signed)
Physician Discharge Summary  Patient ID: [name] MRN: [id number] DOB/AGE: 07-Jun-1951 66 y.o.  Admit date: 08/19/2017 Discharge date: 08/23/2017  Admission Diagnoses:  Active Problems:   Trimalleolar fracture   Ankle fracture   Discharge Diagnoses:  Same  Surgeries: Procedure(s): OPEN REDUCTION INTERNAL FIXATION (ORIF) ANKLE FRACTURE on 08/19/2017 - 08/20/2017   Consultants:   Discharged Condition: Stable  Hospital Course: [name] is an 66 y.o. female who was admitted 08/19/2017 with a chief complaint of  Chief Complaint  Patient presents with  . Fall  . Ankle Injury    R  , and found to have a diagnosis of trimalleolar ankle fracture on the right with left nondisplaced metatarsal fracture.  They were brought to the operating room on 08/19/2017 - 08/20/2017 and underwent the above named procedures.  She tolerated the procedure well and was transsferred to recovery room in good condition.  Seen on the floor for 2 days postop where mobilization and therapy assisted with the patient being able to go home.  Patient discharged in good condition with appropriate DVT prophylaxis.  She will be nonweightbearing on the right and weightbearing as tolerated on the left.  Follow-up with me in a week for clinical recheck on the skin on the medial side of her ankle fracture  Antibiotics given:  Anti-infectives    Start     Dose/Rate Route Frequency Ordered Stop   08/21/17 0600  clindamycin (CLEOCIN) IVPB 900 mg  Status:  Discontinued     900 mg 100 mL/hr over 30 Minutes Intravenous On call to O.R. 08/20/17 1652 08/20/17 1655   08/20/17 1800  clindamycin (CLEOCIN) IVPB 600 mg     600 mg 100 mL/hr over 30 Minutes Intravenous Every 6 hours 08/20/17 1652 08/21/17 0041   08/20/17 1106  clindamycin (CLEOCIN) 900 MG/50ML IVPB    Comments:  Herbie Drape   : cabinet override      08/20/17 1106 08/20/17 1148    .  Recent vital signs:  Vitals:   08/21/17 2245 08/22/17 0536  BP: (!)  106/53 114/72  Pulse: 66 89  Resp: 18 17  Temp: 98.4 F (36.9 C) 98.6 F (37 C)  SpO2: 95% 94%    Recent laboratory studies:  Results for orders placed or performed during the hospital encounter of 08/19/17  MRSA PCR Screening  Result Value Ref Range   MRSA by PCR NEGATIVE NEGATIVE  Basic metabolic panel  Result Value Ref Range   Sodium 142 135 - 145 mmol/L   Potassium 3.6 3.5 - 5.1 mmol/L   Chloride 108 101 - 111 mmol/L   CO2 26 22 - 32 mmol/L   Glucose, Bld 119 (H) 65 - 99 mg/dL   BUN 22 (H) 6 - 20 mg/dL   Creatinine, Ser 0.96 0.44 - 1.00 mg/dL   Calcium 9.0 8.9 - 10.3 mg/dL   GFR calc non Af Amer >60 >60 mL/min   GFR calc Af Amer >60 >60 mL/min   Anion gap 8 5 - 15  CBC with Differential  Result Value Ref Range   WBC 13.0 (H) 4.0 - 10.5 K/uL   RBC 4.31 3.87 - 5.11 MIL/uL   Hemoglobin 12.7 12.0 - 15.0 g/dL   HCT 38.2 36.0 - 46.0 %   MCV 88.6 78.0 - 100.0 fL   MCH 29.5 26.0 - 34.0 pg   MCHC 33.2 30.0 - 36.0 g/dL   RDW 12.7 11.5 - 15.5 %   Platelets 231 150 - 400 K/uL  Neutrophils Relative % 71 %   Neutro Abs 9.2 (H) 1.7 - 7.7 K/uL   Lymphocytes Relative 23 %   Lymphs Abs 3.0 0.7 - 4.0 K/uL   Monocytes Relative 5 %   Monocytes Absolute 0.6 0.1 - 1.0 K/uL   Eosinophils Relative 1 %   Eosinophils Absolute 0.2 0.0 - 0.7 K/uL   Basophils Relative 0 %   Basophils Absolute 0.0 0.0 - 0.1 K/uL    Discharge Medications:   Allergies as of 08/22/2017      Reactions   Amoxicillin    REACTION: unspecified   Penicillins Other (See Comments)   Unknown reaction      Medication List    STOP taking these medications   nabumetone 750 MG tablet Commonly known as:  RELAFEN   nitrofurantoin 100 MG capsule Commonly known as:  MACRODANTIN     TAKE these medications   methocarbamol 500 MG tablet Commonly known as:  ROBAXIN Take 1 tablet (500 mg total) by mouth every 6 (six) hours as needed for muscle spasms.   Multiple Vitamin tablet Take 1 tablet by mouth daily.    oxyCODONE 5 MG immediate release tablet Commonly known as:  Oxy IR/ROXICODONE Take 1-2 tablets (5-10 mg total) by mouth every 3 (three) hours as needed for breakthrough pain.   pantoprazole 40 MG tablet Commonly known as:  PROTONIX Take 1 tablet (40 mg total) by mouth daily.   rivaroxaban 10 MG Tabs tablet Commonly known as:  XARELTO Take 1 tablet (10 mg total) by mouth daily.   YUVAFEM 10 MCG Tabs vaginal tablet Generic drug:  Estradiol PLACE 1 TABLET (10 MCG TOTAL) VAGINALLY 2 (TWO) TIMES A WEEK.       Diagnostic Studies: Dg Ankle 2 Views Right  Result Date: 08/20/2017 CLINICAL DATA:  66 year old female undergoing right ankle ORIF. EXAM: RIGHT ANKLE - 2 VIEW COMPARISON:  08/19/2017 FINDINGS: 2 intraoperative fluoroscopic spot views of the right ankle. Lateral plate and screw fixation of the distal right fibula and cannulated screw fixation of the distal right tibia. Hardware intact with anatomic fracture alignment. FLUOROSCOPY TIME:  1 minutes 16 seconds IMPRESSION: ORIF of right ankle trimalleolar fracture with no adverse features. Electronically Signed   By: Genevie Ann M.D.   On: 08/20/2017 14:38   Dg Ankle Complete Right  Result Date: 08/20/2017 CLINICAL DATA:  Status post reduction of right ankle trimalleolar fracture. Initial encounter. EXAM: RIGHT ANKLE - COMPLETE 3+ VIEW COMPARISON:  Right ankle radiographs performed 08/19/2017 FINDINGS: There is improved alignment of the trimalleolar fracture of the right ankle, with residual superior and posterior displacement of the posterior malleolar fragment. Previously noted subluxation has resolved. An associated splint is noted. A plantar calcaneal spur is seen. No new fractures are identified. IMPRESSION: Improved alignment of trimalleolar fracture of the right ankle. Electronically Signed   By: Garald Balding M.D.   On: 08/20/2017 02:28   Dg Ankle Complete Right  Result Date: 08/19/2017 CLINICAL DATA:  She reports that she missed a  step and fell in front of her house. When she fell, she reports twisting her R ankle. Pt states she has generalized right ankle pain that radiates up her ankle. Pt states previous injury to ankle as a child and foot surgery. EXAM: RIGHT ANKLE - COMPLETE 3+ VIEW COMPARISON:  None. FINDINGS: Fracture of the medial malleolus with lateral subluxation of the talus relation to the tibia. There is fracture of the posterior malleolus of the tibia. Fracture of the fibula at  the level ankle mortise. Calcaneus appears normal. IMPRESSION: 1. Fracture of the medial malleolus and posterior malleolus of the tibia. 2. Subluxation of the tibia in relation to the talus with disruption of the tibiofibular syndesmosis 3. Fracture of the fibula at the level of the ankle mortise 4. So-called trimalleolar fracture dislocation. Electronically Signed   By: Suzy Bouchard M.D.   On: 08/19/2017 21:45   Dg Ankle Right Port  Result Date: 08/20/2017 CLINICAL DATA:  Status post open reduction internal fixation of right ankle fracture. EXAM: PORTABLE RIGHT ANKLE - 2 VIEW COMPARISON:  08/19/2017 FINDINGS: Post sideplate and screw fixation of comminuted distal fibular fracture. Post cancellous screw fixation of comminuted distal tibial fracture. The alignment is near anatomic. Ankle mortise is mildly widened medially. Cast material overlies the ankle. IMPRESSION: Post open reduction and internal fixation of complex trimalleolar right ankle fracture with near anatomic alignment. Mild widening of the ankle mortise medially. Electronically Signed   By: Fidela Salisbury M.D.   On: 08/20/2017 16:32   Dg Finger Middle Right  Result Date: 08/19/2017 CLINICAL DATA:  66 y/o F; initial encounter with injury to the right little finger. EXAM: RIGHT MIDDLE FINGER 2+V COMPARISON:  None. FINDINGS: There is no evidence of fracture or dislocation. There is no evidence of arthropathy or other focal bone abnormality. Soft tissues are unremarkable.  IMPRESSION: Negative. Electronically Signed   By: Kristine Garbe M.D.   On: 08/19/2017 23:18   Dg Foot Complete Left  Result Date: 08/19/2017 CLINICAL DATA:  66 y/o F; a initial encounter with injury to the left foot. EXAM: LEFT FOOT - COMPLETE 3+ VIEW COMPARISON:  None. FINDINGS: Lucency traversing base of fifth metatarsal and lateral cuboid cortex compatible with acute fractures. Additionally, there are small bony bodies dorsal to talus and navicular bone possibly representing avulsion fractures, correlate for focal tenderness. Plantar calcaneal bone spur. IMPRESSION: 1. Lucency traversing base of fifth metatarsal and lateral cuboid cortex compatible with acute fractures. 2. Small bony bodies dorsal to talus and navicular bone possibly representing avulsion fractures, correlate for focal tenderness. Electronically Signed   By: Kristine Garbe M.D.   On: 08/19/2017 23:24   Dg C-arm Gt 120 Min-no Report  Result Date: 08/20/2017 Fluoroscopy was utilized by the requesting physician.  No radiographic interpretation.    Disposition: 01-Home or Self Care  Discharge Instructions    Call MD / Call 911    Complete by:  As directed    If you experience chest pain or shortness of breath, CALL 911 and be transported to the hospital emergency room.  If you develope a fever above 101 F, pus (white drainage) or increased drainage or redness at the wound, or calf pain, call your surgeon's office.   Call MD / Call 911    Complete by:  As directed    If you experience chest pain or shortness of breath, CALL 911 and be transported to the hospital emergency room.  If you develope a fever above 101 F, pus (white drainage) or increased drainage or redness at the wound, or calf pain, call your surgeon's office.   Constipation Prevention    Complete by:  As directed    Drink plenty of fluids.  Prune juice may be helpful.  You may use a stool softener, such as Colace (over the counter) 100 mg  twice a day.  Use MiraLax (over the counter) for constipation as needed.   Constipation Prevention    Complete by:  As directed  Drink plenty of fluids.  Prune juice may be helpful.  You may use a stool softener, such as Colace (over the counter) 100 mg twice a day.  Use MiraLax (over the counter) for constipation as needed.   Diet - low sodium heart healthy    Complete by:  As directed    Diet - low sodium heart healthy    Complete by:  As directed    Discharge instructions    Complete by:  As directed    Non weight bearing right leg Elevate right foot Ok to weight bear on left foot with hard sole tennis shoe Return to clinic 7 days   Increase activity slowly as tolerated    Complete by:  As directed    Increase activity slowly as tolerated    Complete by:  As directed          Signed: Anderson Malta 08/23/2017, 5:23 PM

## 2017-08-23 NOTE — Telephone Encounter (Signed)
Changed to baclofen

## 2017-08-23 NOTE — Telephone Encounter (Signed)
thx

## 2017-08-23 NOTE — Telephone Encounter (Signed)
IC patient and advised.  

## 2017-08-23 NOTE — Telephone Encounter (Signed)
Patient called stating that the Robaxin that was prescribed to her is on manufacture back order and she was wondering if she could take something else. CB # [phone]

## 2017-08-24 DIAGNOSIS — S8292XA Unspecified fracture of left lower leg, initial encounter for closed fracture: Secondary | ICD-10-CM | POA: Diagnosis not present

## 2017-08-28 ENCOUNTER — Ambulatory Visit (INDEPENDENT_AMBULATORY_CARE_PROVIDER_SITE_OTHER): Payer: Federal, State, Local not specified - PPO

## 2017-08-28 ENCOUNTER — Ambulatory Visit (INDEPENDENT_AMBULATORY_CARE_PROVIDER_SITE_OTHER): Payer: Federal, State, Local not specified - PPO | Admitting: Orthopedic Surgery

## 2017-08-28 ENCOUNTER — Encounter (INDEPENDENT_AMBULATORY_CARE_PROVIDER_SITE_OTHER): Payer: Self-pay | Admitting: Orthopedic Surgery

## 2017-08-28 DIAGNOSIS — S82891D Other fracture of right lower leg, subsequent encounter for closed fracture with routine healing: Secondary | ICD-10-CM

## 2017-08-28 NOTE — Progress Notes (Signed)
Post-Op Visit Note   Patient: [name]           Date of Birth: 1951-05-09           MRN: [id number] Visit Date: 08/28/2017 PCP: Biagio Borg, MD   Assessment & Plan:  Chief Complaint:  Chief Complaint  Patient presents with  . Right Ankle - Routine Post Op   Visit Diagnoses:  1. Closed fracture of right ankle with routine healing, subsequent encounter     Plan:    [name] is now a week out from right ankle trimalleolar ankle fracture fixation. She is doing well.She has a new fracture blister on the anterior aspect of the ankle.  Motor sensory function to the ankle is intact and all suture lines intact.  Radiographs look good.  Plan at this time is to apply a 4 x 4 gauze to the fracture blister and then use Ace wrap on the ankle plus fracture.  Nonweightbearing.  No calf tenderness.  Continue Xarelto for DVT prophylaxis.  Follow-up in 1 week for suture removal Patient's left foot is doing well.  She has metatarsal fracture in this area Continue to weight-bear as tolerated on that left foot  Follow-Up Instructions: No Follow-up on file.   Orders:  Orders Placed This Encounter  Procedures  . XR Ankle Complete Right   No orders of the defined types were placed in this encounter.   Imaging: Xr Ankle Complete Right  Result Date: 08/28/2017 AP lateral mortise right ankle reviewed.  Trimalleolar ankle fracture fixation has been performed.  Hardware appears to be in good position and alignment.  Mortise is symmetric.  No evidence of complicating features.   PMFS History: Patient Active Problem List   Diagnosis Date Noted  . Trimalleolar fracture 08/20/2017  . Ankle fracture 08/20/2017  . Sleep-disordered breathing 07/30/2017  . Right hip pain 07/28/2017  . Abnormal TSH 07/21/2015  . URI (upper respiratory infection) 04/17/2015  . Greater trochanteric bursitis of right hip 08/04/2014  . Localized osteoarthrosis, lower leg 08/04/2014  . Biliary colic 50/56/9794  .  Cholelithiasis 08/07/2013  . Left otitis media 12/07/2012  . Hyperlipidemia 02/13/2012  . Impaired glucose tolerance 02/13/2012  . Preventative health care 02/11/2012  . Diverticulosis 02/11/2012  . Degenerative arthritis of hip 02/11/2012  . Cervical disc disease 02/11/2012  . Cervical cancer ([place])   . Allergic rhinitis, cause unspecified   . Chronic LBP   . Obesity   . Blood in stool 08/31/2010  . HEMORRHOIDS-INTERNAL 07/21/2010  . CONSTIPATION 07/19/2010  . ARTHRITIS, KNEE 02/10/2009  . VAGINITIS, ATROPHIC 11/11/2008  . EXOGENOUS OBESITY 07/29/2008  . GERD 08/10/2007  . OVERACTIVE BLADDER 08/10/2007   Past Medical History:  Diagnosis Date  . Abnormal Pap smear of vagina 06/17/15   LGSIL  . Allergic rhinitis, cause unspecified   . Arthritis   . ARTHRITIS, KNEE 02/10/2009   Qualifier: Diagnosis of  By: Niel Hummer MD, Lorinda Creed   . BRCA negative 03/25/13  . Cervical cancer ([place]) 1984  . Cervical disc disease 02/11/2012  . Chronic LBP   . Degenerative arthritis of hip 02/11/2012  . Diverticulosis 02/11/2012  . GERD 08/10/2007   Qualifier: Diagnosis of  By: Sherlynn Stalls, CMA, [place]    . GERD (gastroesophageal reflux disease)   . GERD (gastroesophageal reflux disease)   . HEMORRHOIDS-INTERNAL 07/21/2010   Qualifier: Diagnosis of  By: Chester Holstein NP, Nevin Bloodgood    . Hiatal hernia   . Hyperlipidemia 02/13/2012  . Mitral valve  prolapse   . OAB (overactive bladder)   . Obesity   . OVERACTIVE BLADDER 08/10/2007   Qualifier: Diagnosis of  By: Sherlynn Stalls, CMA, [place]    . Pneumonia   . PONV (postoperative nausea and vomiting)   . Recurrent UTI   . SUI (stress urinary incontinence, female)   . VAGINITIS, ATROPHIC 11/11/2008   Qualifier: Diagnosis of  By: Niel Hummer MD, Lorinda Creed     Family History  Problem Relation Age of Onset  . Breast cancer Mother        dx in her last 43's  . Heart disease Sister   . Breast cancer Sister        dx in her late 52's  . Diabetes Brother   . Heart disease Brother          triple bypass  . Diabetes Maternal Aunt   . Diabetes Maternal Uncle   . Cancer Maternal Grandmother        uterine  . Colon cancer Neg Hx   . Rectal cancer Neg Hx   . Esophageal cancer Neg Hx     Past Surgical History:  Procedure Laterality Date  . BUNIONECTOMY  2007  . CERVICAL DISCECTOMY  2002  . CHOLECYSTECTOMY N/A 08/13/2013   Procedure: LAPAROSCOPIC CHOLECYSTECTOMY WITH INTRAOPERATIVE CHOLANGIOGRAM;  Surgeon: Shann Medal, MD;  Location: [facility];  Service: General;  Laterality: N/A;  . COLONOSCOPY    . conization of cervix  1982   with D&C  . CYSTOCELE REPAIR  2003   rectocele repair  . EYE SURGERY Bilateral    lasik  . ORIF ANKLE FRACTURE Right 08/20/2017   Procedure: OPEN REDUCTION INTERNAL FIXATION (ORIF) ANKLE FRACTURE;  Surgeon: Meredith Pel, MD;  Location: [facility];  Service: Orthopedics;  Laterality: Right;  . SHOULDER ARTHROSCOPY  2004   frozen shoulder  . TONSILLECTOMY    . VAGINAL HYSTERECTOMY  1986   Social History   Occupational History  .  [employer]   Social History Main Topics  . Smoking status: Never Smoker  . Smokeless tobacco: Never Used  . Alcohol use No  . Drug use: No  . Sexual activity: Yes    Partners: Male    Birth control/ protection: Post-menopausal     Comment: hysterectomy

## 2017-09-04 ENCOUNTER — Encounter (INDEPENDENT_AMBULATORY_CARE_PROVIDER_SITE_OTHER): Payer: Self-pay | Admitting: Orthopedic Surgery

## 2017-09-04 ENCOUNTER — Ambulatory Visit (INDEPENDENT_AMBULATORY_CARE_PROVIDER_SITE_OTHER): Payer: Federal, State, Local not specified - PPO | Admitting: Orthopedic Surgery

## 2017-09-04 DIAGNOSIS — S82891D Other fracture of right lower leg, subsequent encounter for closed fracture with routine healing: Secondary | ICD-10-CM

## 2017-09-04 MED ORDER — METHOCARBAMOL 500 MG PO TABS: 500.0000 mg | ORAL_TABLET | Freq: Two times a day (BID) | ORAL | 0 refills | Status: DC | PRN

## 2017-09-04 MED ORDER — OXYCODONE HCL 5 MG PO TABS: 5.0000 mg | ORAL_TABLET | Freq: Three times a day (TID) | ORAL | 0 refills | Status: DC | PRN

## 2017-09-05 ENCOUNTER — Telehealth (INDEPENDENT_AMBULATORY_CARE_PROVIDER_SITE_OTHER): Payer: Self-pay | Admitting: Orthopedic Surgery

## 2017-09-05 NOTE — Progress Notes (Signed)
Post-Op Visit Note   Patient: [name]           Date of Birth: [birth year]           MRN: [id number] Visit Date: 09/04/2017 PCP: Biagio Borg, MD   Assessment & Plan:  Chief Complaint:  Chief Complaint  Patient presents with  . Right Ankle - Routine Post Op   Visit Diagnoses:  1. Closed fracture of right ankle with routine healing, subsequent encounter     Plan: [name] is a patient who is now 2 weeks out right ankle fracture fixation.  On examination skin looks intact.  Sutures removed.  She is on Xarelto for DVT prophylaxis.  I will refill her Robaxin and oxycodone and apply Ace wrap.  Okay to start ankle range of motion exercises but hold off on weightbearing.  Come back in 2 weeks for clinical recheck and repeat radiographs and initiation of weightbearing in fracture boot.  Follow-Up Instructions: Return in about 2 weeks (around 09/18/2017).   Orders:  No orders of the defined types were placed in this encounter.  Meds ordered this encounter  Medications  . oxyCODONE (OXY IR/ROXICODONE) 5 MG immediate release tablet    Sig: Take 1-2 tablets (5-10 mg total) by mouth every 8 (eight) hours as needed for breakthrough pain.    Dispense:  45 tablet    Refill:  0  . methocarbamol (ROBAXIN) 500 MG tablet    Sig: Take 1 tablet (500 mg total) by mouth 2 (two) times daily as needed for muscle spasms.    Dispense:  30 tablet    Refill:  0    Imaging: No results found.  PMFS History: Patient Active Problem List   Diagnosis Date Noted  . Trimalleolar fracture 08/20/2017  . Ankle fracture 08/20/2017  . Sleep-disordered breathing 07/30/2017  . Right hip pain 07/28/2017  . Abnormal TSH 07/21/2015  . URI (upper respiratory infection) 04/17/2015  . Greater trochanteric bursitis of right hip 08/04/2014  . Localized osteoarthrosis, lower leg 08/04/2014  . Biliary colic 10/62/6948  . Cholelithiasis 08/07/2013  . Left otitis media 12/07/2012  . Hyperlipidemia 02/13/2012  .  Impaired glucose tolerance 02/13/2012  . Preventative health care 02/11/2012  . Diverticulosis 02/11/2012  . Degenerative arthritis of hip 02/11/2012  . Cervical disc disease 02/11/2012  . Cervical cancer ([place])   . Allergic rhinitis, cause unspecified   . Chronic LBP   . Obesity   . Blood in stool 08/31/2010  . HEMORRHOIDS-INTERNAL 07/21/2010  . CONSTIPATION 07/19/2010  . ARTHRITIS, KNEE 02/10/2009  . VAGINITIS, ATROPHIC 11/11/2008  . EXOGENOUS OBESITY 07/29/2008  . GERD 08/10/2007  . OVERACTIVE BLADDER 08/10/2007   Past Medical History:  Diagnosis Date  . Abnormal Pap smear of vagina 06/17/15   LGSIL  . Allergic rhinitis, cause unspecified   . Arthritis   . ARTHRITIS, KNEE 02/10/2009   Qualifier: Diagnosis of  By: Niel Hummer MD, Lorinda Creed   . BRCA negative 03/25/13  . Cervical cancer ([place]) 1984  . Cervical disc disease 02/11/2012  . Chronic LBP   . Degenerative arthritis of hip 02/11/2012  . Diverticulosis 02/11/2012  . GERD 08/10/2007   Qualifier: Diagnosis of  By: Sherlynn Stalls, CMA, [place]    . GERD (gastroesophageal reflux disease)   . GERD (gastroesophageal reflux disease)   . HEMORRHOIDS-INTERNAL 07/21/2010   Qualifier: Diagnosis of  By: Chester Holstein NP, Nevin Bloodgood    . Hiatal hernia   . Hyperlipidemia 02/13/2012  . Mitral valve prolapse   .  OAB (overactive bladder)   . Obesity   . OVERACTIVE BLADDER 08/10/2007   Qualifier: Diagnosis of  By: Sherlynn Stalls, CMA, [place]    . Pneumonia   . PONV (postoperative nausea and vomiting)   . Recurrent UTI   . SUI (stress urinary incontinence, female)   . VAGINITIS, ATROPHIC 11/11/2008   Qualifier: Diagnosis of  By: Niel Hummer MD, Lorinda Creed     Family History  Problem Relation Age of Onset  . Breast cancer Mother        dx in her last 40's  . Heart disease Sister   . Breast cancer Sister        dx in her late 39's  . Diabetes Brother   . Heart disease Brother        triple bypass  . Diabetes Maternal Aunt   . Diabetes Maternal Uncle   . Cancer  Maternal Grandmother        uterine  . Colon cancer Neg Hx   . Rectal cancer Neg Hx   . Esophageal cancer Neg Hx     Past Surgical History:  Procedure Laterality Date  . BUNIONECTOMY  2007  . CERVICAL DISCECTOMY  2002  . CHOLECYSTECTOMY N/A 08/13/2013   Procedure: LAPAROSCOPIC CHOLECYSTECTOMY WITH INTRAOPERATIVE CHOLANGIOGRAM;  Surgeon: Shann Medal, MD;  Location: [facility];  Service: General;  Laterality: N/A;  . COLONOSCOPY    . conization of cervix  1982   with D&C  . CYSTOCELE REPAIR  2003   rectocele repair  . EYE SURGERY Bilateral    lasik  . ORIF ANKLE FRACTURE Right 08/20/2017   Procedure: OPEN REDUCTION INTERNAL FIXATION (ORIF) ANKLE FRACTURE;  Surgeon: Meredith Pel, MD;  Location: [facility];  Service: Orthopedics;  Laterality: Right;  . SHOULDER ARTHROSCOPY  2004   frozen shoulder  . TONSILLECTOMY    . VAGINAL HYSTERECTOMY  1986   Social History   Occupational History  .  [employer]   Social History Main Topics  . Smoking status: Never Smoker  . Smokeless tobacco: Never Used  . Alcohol use No  . Drug use: No  . Sexual activity: Yes    Partners: Male    Birth control/ protection: Post-menopausal     Comment: hysterectomy

## 2017-09-05 NOTE — Telephone Encounter (Signed)
Patient request handicap paperwork form

## 2017-09-05 NOTE — Telephone Encounter (Signed)
Ok to complete? How long?

## 2017-09-05 NOTE — Telephone Encounter (Signed)
IC advised could pick up after 9am in the morning at front desk to take to [facility]

## 2017-09-05 NOTE — Telephone Encounter (Signed)
Y 4 mos

## 2017-09-06 ENCOUNTER — Telehealth (INDEPENDENT_AMBULATORY_CARE_PROVIDER_SITE_OTHER): Payer: Self-pay | Admitting: Orthopedic Surgery

## 2017-09-06 NOTE — Telephone Encounter (Signed)
Patient requests an RX for handicap sticker. [name] 1951/08/06

## 2017-09-18 ENCOUNTER — Ambulatory Visit (INDEPENDENT_AMBULATORY_CARE_PROVIDER_SITE_OTHER): Payer: Federal, State, Local not specified - PPO | Admitting: Orthopedic Surgery

## 2017-09-18 ENCOUNTER — Ambulatory Visit (INDEPENDENT_AMBULATORY_CARE_PROVIDER_SITE_OTHER): Payer: Federal, State, Local not specified - PPO

## 2017-09-18 ENCOUNTER — Encounter (INDEPENDENT_AMBULATORY_CARE_PROVIDER_SITE_OTHER): Payer: Self-pay | Admitting: Orthopedic Surgery

## 2017-09-18 DIAGNOSIS — S82891D Other fracture of right lower leg, subsequent encounter for closed fracture with routine healing: Secondary | ICD-10-CM | POA: Diagnosis not present

## 2017-09-18 NOTE — Progress Notes (Signed)
Post-Op Visit Note   Patient: [name]           Date of Birth: October 20, 1951           MRN: [id number] Visit Date: 09/18/2017 PCP: Biagio Borg, MD   Assessment & Plan:  Chief Complaint:  Chief Complaint  Patient presents with  . Right Ankle - Routine Post Op   Visit Diagnoses:  1. Closed fracture of right ankle with routine healing, subsequent encounter     Plan: [name] is a 66 year old patient right ankle open reduction internal fixation.  She's been doing well.  Taking oxycodone and Robaxin for pain.  On exam the incisions are healing nicely.  No evidence of complication around the blister on the medial side.  That is fully epithelialized.  No calf tenderness today.  Orders transition off Xarelto 2 baby aspirin 2 per day.  On her to use the fracture boot for ambulation partial weightbearing for 2 weeks and then use an ASO for weightbearing as tolerated after that.  I'll see her back in 4 weeks repeat radiographs likely release at that time  Follow-Up Instructions: Return in about 4 weeks (around 10/16/2017).   Orders:  Orders Placed This Encounter  Procedures  . XR Ankle Complete Right   No orders of the defined types were placed in this encounter.   Imaging: Xr Ankle Complete Right  Result Date: 09/18/2017 AP lateral mortise right ankle reviewed.  Trimalleolar ankle fracture has been transfixed with screws in the posterior malleolus and medial malleolus and plate on the lateral malleolus.  No evidence of complicating features.  Fracture healing in the interim since last radiographs is apparent.  Mortise is symmetric   PMFS History: Patient Active Problem List   Diagnosis Date Noted  . Trimalleolar fracture 08/20/2017  . Ankle fracture 08/20/2017  . Sleep-disordered breathing 07/30/2017  . Right hip pain 07/28/2017  . Abnormal TSH 07/21/2015  . URI (upper respiratory infection) 04/17/2015  . Greater trochanteric bursitis of right hip 08/04/2014  . Localized  osteoarthrosis, lower leg 08/04/2014  . Biliary colic 40/98/1191  . Cholelithiasis 08/07/2013  . Left otitis media 12/07/2012  . Hyperlipidemia 02/13/2012  . Impaired glucose tolerance 02/13/2012  . Preventative health care 02/11/2012  . Diverticulosis 02/11/2012  . Degenerative arthritis of hip 02/11/2012  . Cervical disc disease 02/11/2012  . Cervical cancer ([place])   . Allergic rhinitis, cause unspecified   . Chronic LBP   . Obesity   . Blood in stool 08/31/2010  . HEMORRHOIDS-INTERNAL 07/21/2010  . CONSTIPATION 07/19/2010  . ARTHRITIS, KNEE 02/10/2009  . VAGINITIS, ATROPHIC 11/11/2008  . EXOGENOUS OBESITY 07/29/2008  . GERD 08/10/2007  . OVERACTIVE BLADDER 08/10/2007   Past Medical History:  Diagnosis Date  . Abnormal Pap smear of vagina 06/17/15   LGSIL  . Allergic rhinitis, cause unspecified   . Arthritis   . ARTHRITIS, KNEE 02/10/2009   Qualifier: Diagnosis of  By: Niel Hummer MD, Lorinda Creed   . BRCA negative 03/25/13  . Cervical cancer ([place]) 1984  . Cervical disc disease 02/11/2012  . Chronic LBP   . Degenerative arthritis of hip 02/11/2012  . Diverticulosis 02/11/2012  . GERD 08/10/2007   Qualifier: Diagnosis of  By: Sherlynn Stalls, CMA, [place]    . GERD (gastroesophageal reflux disease)   . GERD (gastroesophageal reflux disease)   . HEMORRHOIDS-INTERNAL 07/21/2010   Qualifier: Diagnosis of  By: Chester Holstein NP, Nevin Bloodgood    . Hiatal hernia   . Hyperlipidemia 02/13/2012  .  Mitral valve prolapse   . OAB (overactive bladder)   . Obesity   . OVERACTIVE BLADDER 08/10/2007   Qualifier: Diagnosis of  By: Sherlynn Stalls, CMA, [place]    . Pneumonia   . PONV (postoperative nausea and vomiting)   . Recurrent UTI   . SUI (stress urinary incontinence, female)   . VAGINITIS, ATROPHIC 11/11/2008   Qualifier: Diagnosis of  By: Niel Hummer MD, Lorinda Creed     Family History  Problem Relation Age of Onset  . Breast cancer Mother        dx in her last 57's  . Heart disease Sister   . Breast cancer Sister         dx in her late 84's  . Diabetes Brother   . Heart disease Brother        triple bypass  . Diabetes Maternal Aunt   . Diabetes Maternal Uncle   . Cancer Maternal Grandmother        uterine  . Colon cancer Neg Hx   . Rectal cancer Neg Hx   . Esophageal cancer Neg Hx     Past Surgical History:  Procedure Laterality Date  . BUNIONECTOMY  2007  . CERVICAL DISCECTOMY  2002  . COLONOSCOPY    . conization of cervix  1982   with D&C  . CYSTOCELE REPAIR  2003   rectocele repair  . EYE SURGERY Bilateral    lasik  . SHOULDER ARTHROSCOPY  2004   frozen shoulder  . TONSILLECTOMY    . VAGINAL HYSTERECTOMY  1986   Social History   Occupational History    Employer: [employer]  Tobacco Use  . Smoking status: Never Smoker  . Smokeless tobacco: Never Used  Substance and Sexual Activity  . Alcohol use: No    Alcohol/week: 0.0 oz  . Drug use: No  . Sexual activity: Yes    Partners: Male    Birth control/protection: Post-menopausal    Comment: hysterectomy

## 2017-09-24 DIAGNOSIS — S8292XA Unspecified fracture of left lower leg, initial encounter for closed fracture: Secondary | ICD-10-CM | POA: Diagnosis not present

## 2017-10-10 ENCOUNTER — Ambulatory Visit (INDEPENDENT_AMBULATORY_CARE_PROVIDER_SITE_OTHER): Payer: Federal, State, Local not specified - PPO | Admitting: Pulmonary Disease

## 2017-10-10 ENCOUNTER — Encounter: Payer: Self-pay | Admitting: Pulmonary Disease

## 2017-10-10 VITALS — BP 118/76 | HR 75 | Ht 67.75 in | Wt 232.0 lb

## 2017-10-10 DIAGNOSIS — G4733 Obstructive sleep apnea (adult) (pediatric): Secondary | ICD-10-CM | POA: Diagnosis not present

## 2017-10-10 DIAGNOSIS — G4730 Sleep apnea, unspecified: Secondary | ICD-10-CM

## 2017-10-10 NOTE — Progress Notes (Signed)
Subjective:    Patient ID: [name], female    DOB: 1951-04-14, 66 y.o.   MRN: [id number]  HPI  Chief Complaint  Patient presents with  . Sleep Consult    Referred by Dr. Jenny Reichmann for possible OSA. Patient catches herself gasping for air at night. Denies ever having a sleep study before. Also has a hard time falling asleep at night.     66 year old presents for evaluation of sleep disordered breathing. She reports episodes where she feels like she is holding her breath during sleep but seems to wake her up about 2-3 times at night.  She reports not sleeping well and mild snoring for many years.  Her husband has also noticed these events.  She recently had an accidental fall with a right ankle fracture and uses a walker with partial weightbearing. Epworth sleepiness score is 8 and she reports sleepiness while sitting and reading and watching TV. Bedtime is between 9 and 11 PM, sleep latency is 30 minutes to an hour, generally sleeps on her side but rolls over on her back with one pillow, reports 3-5 nocturnal awakenings, denies nocturia.  Sometimes she has sleep latency after arousal up to an hour and is finally out of bed at 6 AM feeling rested with occasional dryness of mouth but denies headaches. Her weight has been unchanged over the last 2 years. She had tonsillectomy as a child and hysterectomy at the young age of 52 for cervical cancer. 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 Abnormal Pap smear of vagina 06/17/15   LGSIL  . Allergic rhinitis, cause unspecified   . Arthritis   . ARTHRITIS, KNEE 02/10/2009   Qualifier: Diagnosis of  By: Niel Hummer MD, Lorinda Creed   . BRCA negative 03/25/13  . Cervical cancer ([place]) 1984  . Cervical disc disease 02/11/2012  . Chronic LBP   . Degenerative arthritis of hip 02/11/2012  . Diverticulosis  02/11/2012  . GERD 08/10/2007   Qualifier: Diagnosis of  By: Sherlynn Stalls, CMA, [place]    . GERD (gastroesophageal reflux disease)   . GERD (gastroesophageal reflux disease)   . HEMORRHOIDS-INTERNAL 07/21/2010   Qualifier: Diagnosis of  By: Chester Holstein NP, Nevin Bloodgood    . Hiatal hernia   . Hyperlipidemia 02/13/2012  . Mitral valve prolapse   . OAB (overactive bladder)   . Obesity   . OVERACTIVE BLADDER 08/10/2007   Qualifier: Diagnosis of  By: Sherlynn Stalls, CMA, [place]    . Pneumonia   . PONV (postoperative nausea and vomiting)   . Recurrent UTI   . SUI (stress urinary incontinence, female)   . VAGINITIS, ATROPHIC 11/11/2008   Qualifier: Diagnosis of  By: Niel Hummer MD, Lorinda Creed    Past Surgical History:  Procedure Laterality Date  . BUNIONECTOMY  2007  . CERVICAL DISCECTOMY  2002  . CHOLECYSTECTOMY N/A 08/13/2013   Procedure: LAPAROSCOPIC CHOLECYSTECTOMY WITH INTRAOPERATIVE CHOLANGIOGRAM;  Surgeon: Shann Medal, MD;  Location: [facility];  Service: General;  Laterality: N/A;  . COLONOSCOPY    . conization of cervix  1982   with D&C  . CYSTOCELE REPAIR  2003   rectocele repair  . EYE SURGERY Bilateral    lasik  . ORIF ANKLE FRACTURE Right 08/20/2017   Procedure: OPEN REDUCTION INTERNAL FIXATION (ORIF) ANKLE FRACTURE;  Surgeon: Meredith Pel, MD;  Location: [facility];  Service: Orthopedics;  Laterality: Right;  . SHOULDER ARTHROSCOPY  2004   frozen shoulder  . TONSILLECTOMY    . VAGINAL HYSTERECTOMY  1986    Allergies  Allergen Reactions  . Amoxicillin     REACTION: unspecified  . Penicillins Other (See Comments)    Unknown reaction     Social History   Socioeconomic History  . Marital status: Married    Spouse name: Not on file  . Number of children: 1  . Years of education: Not on file  . Highest education level: Not on file  Social Needs  . Financial resource strain: Not on file  . Food insecurity - worry: Not on file  . Food insecurity - inability: Not on file  . Transportation  needs - medical: Not on file  . Transportation needs - non-medical: Not on file  Occupational History    Employer: [employer]  Tobacco Use  . Smoking status: Never Smoker  . Smokeless tobacco: Never Used  Substance and Sexual Activity  . Alcohol use: No    Alcohol/week: 0.0 oz  . Drug use: No  . Sexual activity: Yes    Partners: Male    Birth control/protection: Post-menopausal    Comment: hysterectomy  Other Topics Concern  . Not on file  Social History Narrative  . Not on file      Family History  Problem Relation Age of Onset  . Breast cancer Mother        dx in her last 80's  . Heart disease Sister   . Breast cancer Sister        dx in her late 37's  . Diabetes Brother   . Heart disease Brother        triple bypass  . Diabetes Maternal Aunt   . Diabetes Maternal Uncle   . Cancer Maternal Grandmother        uterine  . Colon cancer Neg Hx   . Rectal cancer Neg Hx   . Esophageal cancer Neg Hx      Review of Systems Constitutional: negative for anorexia, fevers and sweats  Eyes: negative for irritation, redness and visual disturbance  Ears, nose, mouth, throat, and face: negative for earaches, epistaxis, nasal congestion and sore throat  Respiratory: negative for cough, dyspnea on exertion, sputum and wheezing  Cardiovascular: negative for chest pain, dyspnea, lower extremity edema, orthopnea, palpitations and syncope  Gastrointestinal: negative for abdominal pain, constipation, diarrhea, melena, nausea and vomiting  Genitourinary:negative for dysuria, frequency and hematuria  Hematologic/lymphatic: negative for bleeding, easy bruising and lymphadenopathy  Musculoskeletal:negative for arthralgias, muscle weakness and stiff joints  Neurological: negative for coordination problems, gait problems, headaches and weakness  Endocrine: negative for diabetic symptoms including polydipsia, polyuria and weight loss     Objective:   Physical Exam  Gen. Pleasant,  well-nourished, in no distress, normal affect ENT - no lesions, no post nasal drip Neck: No JVD, no thyromegaly, no carotid bruits Lungs: no use of accessory muscles, no dullness to percussion, clear without rales or rhonchi  Cardiovascular: Rhythm regular, heart sounds  normal, no murmurs or gallops, no peripheral edema Abdomen: soft and non-tender, no hepatosplenomegaly, BS normal. Musculoskeletal: rt foot wrap, no cyanosis or clubbing Neuro:  alert, non focal       Assessment & Plan:

## 2017-10-10 NOTE — Patient Instructions (Signed)
Home sleep study 

## 2017-10-10 NOTE — Assessment & Plan Note (Signed)
Given excessive daytime somnolence, narrow pharyngeal exam, witnessed apneas & loud snoring, obstructive sleep apnea is very likely & an overnight polysomnogram will be scheduled as a home study. The pathophysiology of obstructive sleep apnea , it's cardiovascular consequences & modes of treatment including CPAP were discused with the patient in detail & they evidenced understanding.  Pretest probability is intermediate  

## 2017-10-16 ENCOUNTER — Ambulatory Visit (INDEPENDENT_AMBULATORY_CARE_PROVIDER_SITE_OTHER): Payer: Federal, State, Local not specified - PPO | Admitting: Orthopedic Surgery

## 2017-10-18 ENCOUNTER — Encounter (INDEPENDENT_AMBULATORY_CARE_PROVIDER_SITE_OTHER): Payer: Self-pay | Admitting: Orthopedic Surgery

## 2017-10-18 ENCOUNTER — Ambulatory Visit (INDEPENDENT_AMBULATORY_CARE_PROVIDER_SITE_OTHER): Payer: Federal, State, Local not specified - PPO | Admitting: Orthopedic Surgery

## 2017-10-18 ENCOUNTER — Ambulatory Visit (INDEPENDENT_AMBULATORY_CARE_PROVIDER_SITE_OTHER): Payer: Federal, State, Local not specified - PPO

## 2017-10-18 DIAGNOSIS — S82851D Displaced trimalleolar fracture of right lower leg, subsequent encounter for closed fracture with routine healing: Secondary | ICD-10-CM

## 2017-10-18 NOTE — Progress Notes (Signed)
 Office Visit Note   Patient: [name]           Date of Birth: 06/06/1951           MRN: [id number] Visit Date: 10/18/2017 Requested by: John, James W, MD [address] [place], [place] [address] PCP: John, James W, MD  Subjective: Chief Complaint  Patient presents with  . Right Ankle - Follow-up, Routine Post Op    HPI: [name] is a 66-year-old patient who is now 2 months out right trimalleolar ankle fracture open reduction internal fixation.  She has been fully weightbearing with a walker.  On exam she has 10 degrees of ankle dorsiflexion past neutral and 15 degrees of plantarflexion past neutral.  Radiographs look good.  Plan is weightbearing as tolerated in regular shoes.  Continue to work on range of motion exercises.  Potential for rapid arthritic development discussed.  I will see her back as needed              ROS: See above  Assessment & Plan: Visit Diagnoses:  1. Closed trimalleolar fracture of right ankle with routine healing, subsequent encounter     Plan: See above  Follow-Up Instructions: Return if symptoms worsen or fail to improve.   Orders:  Orders Placed This Encounter  Procedures  . XR Ankle Complete Right   No orders of the defined types were placed in this encounter.     Procedures: No procedures performed   Clinical Data: No additional findings.  Objective: Vital Signs: There were no vitals taken for this visit.  Physical Exam: See above  Ortho Exam: See above  Specialty Comments:  No specialty comments available.  Imaging: Xr Ankle Complete Right  Result Date: 10/18/2017 AP lateral mortise right ankle reviewed.  Trimalleolar ankle fracture well reduced with hardware in good position.  No evidence of syndesmotic instability.  No hardware complications.  Mild midfoot degenerative changes present    PMFS History: Patient Active Problem List   Diagnosis Date Noted  . Trimalleolar fracture 08/20/2017  . Ankle fracture  08/20/2017  . Sleep-disordered breathing 07/30/2017  . Right hip pain 07/28/2017  . Abnormal TSH 07/21/2015  . URI (upper respiratory infection) 04/17/2015  . Greater trochanteric bursitis of right hip 08/04/2014  . Localized osteoarthrosis, lower leg 08/04/2014  . Biliary colic 08/07/2013  . Cholelithiasis 08/07/2013  . Left otitis media 12/07/2012  . Hyperlipidemia 02/13/2012  . Impaired glucose tolerance 02/13/2012  . Preventative health care 02/11/2012  . Diverticulosis 02/11/2012  . Degenerative arthritis of hip 02/11/2012  . Cervical disc disease 02/11/2012  . Cervical cancer ([facility])   . Allergic rhinitis, cause unspecified   . Chronic LBP   . Obesity   . Blood in stool 08/31/2010  . HEMORRHOIDS-INTERNAL 07/21/2010  . CONSTIPATION 07/19/2010  . ARTHRITIS, KNEE 02/10/2009  . VAGINITIS, ATROPHIC 11/11/2008  . EXOGENOUS OBESITY 07/29/2008  . GERD 08/10/2007  . OVERACTIVE BLADDER 08/10/2007   Past Medical History:  Diagnosis Date  . Abnormal Pap smear of vagina 06/17/15   LGSIL  . Allergic rhinitis, cause unspecified   . Arthritis   . ARTHRITIS, KNEE 02/10/2009   Qualifier: Diagnosis of  By: Stafford Jr MD, Willie R   . BRCA negative 03/25/13  . Cervical cancer ([facility]) 1984  . Cervical disc disease 02/11/2012  . Chronic LBP   . Degenerative arthritis of hip 02/11/2012  . Diverticulosis 02/11/2012  . GERD 08/10/2007   Qualifier: Diagnosis of  By: [place], CMA,   [place]    . GERD (gastroesophageal reflux disease)   . GERD (gastroesophageal reflux disease)   . HEMORRHOIDS-INTERNAL 07/21/2010   Qualifier: Diagnosis of  By: Chester Holstein NP, Nevin Bloodgood    . Hiatal hernia   . Hyperlipidemia 02/13/2012  . Mitral valve prolapse   . OAB (overactive bladder)   . Obesity   . OVERACTIVE BLADDER 08/10/2007   Qualifier: Diagnosis of  By: Sherlynn Stalls, CMA, [place]    . Pneumonia   . PONV (postoperative nausea and vomiting)   . Recurrent UTI   . SUI (stress urinary incontinence, female)   . VAGINITIS, ATROPHIC  11/11/2008   Qualifier: Diagnosis of  By: Niel Hummer MD, Lorinda Creed     Family History  Problem Relation Age of Onset  . Breast cancer Mother        dx in her last 29's  . Heart disease Sister   . Breast cancer Sister        dx in her late 67's  . Diabetes Brother   . Heart disease Brother        triple bypass  . Diabetes Maternal Aunt   . Diabetes Maternal Uncle   . Cancer Maternal Grandmother        uterine  . Colon cancer Neg Hx   . Rectal cancer Neg Hx   . Esophageal cancer Neg Hx     Past Surgical History:  Procedure Laterality Date  . BUNIONECTOMY  2007  . CERVICAL DISCECTOMY  2002  . CHOLECYSTECTOMY N/A 08/13/2013   Procedure: LAPAROSCOPIC CHOLECYSTECTOMY WITH INTRAOPERATIVE CHOLANGIOGRAM;  Surgeon: Shann Medal, MD;  Location: [facility];  Service: General;  Laterality: N/A;  . COLONOSCOPY    . conization of cervix  1982   with D&C  . CYSTOCELE REPAIR  2003   rectocele repair  . EYE SURGERY Bilateral    lasik  . ORIF ANKLE FRACTURE Right 08/20/2017   Procedure: OPEN REDUCTION INTERNAL FIXATION (ORIF) ANKLE FRACTURE;  Surgeon: Meredith Pel, MD;  Location: [facility];  Service: Orthopedics;  Laterality: Right;  . SHOULDER ARTHROSCOPY  2004   frozen shoulder  . TONSILLECTOMY    . VAGINAL HYSTERECTOMY  1986   Social History   Occupational History    Employer: [employer]  Tobacco Use  . Smoking status: Never Smoker  . Smokeless tobacco: Never Used  Substance and Sexual Activity  . Alcohol use: No    Alcohol/week: 0.0 oz  . Drug use: No  . Sexual activity: Yes    Partners: Male    Birth control/protection: Post-menopausal    Comment: hysterectomy

## 2017-10-19 ENCOUNTER — Ambulatory Visit (HOSPITAL_BASED_OUTPATIENT_CLINIC_OR_DEPARTMENT_OTHER): Admit: 2017-10-19 | Payer: Federal, State, Local not specified - PPO | Admitting: General Surgery

## 2017-10-19 ENCOUNTER — Encounter (HOSPITAL_BASED_OUTPATIENT_CLINIC_OR_DEPARTMENT_OTHER): Payer: Self-pay

## 2017-10-19 SURGERY — TRANSANAL HEMORRHOIDAL DEARTERIALIZATION
Anesthesia: Monitor Anesthesia Care

## 2017-10-24 DIAGNOSIS — S8292XA Unspecified fracture of left lower leg, initial encounter for closed fracture: Secondary | ICD-10-CM | POA: Diagnosis not present

## 2017-11-13 DIAGNOSIS — G4733 Obstructive sleep apnea (adult) (pediatric): Secondary | ICD-10-CM | POA: Diagnosis not present

## 2017-11-14 DIAGNOSIS — G4733 Obstructive sleep apnea (adult) (pediatric): Secondary | ICD-10-CM | POA: Diagnosis not present

## 2017-11-20 ENCOUNTER — Telehealth: Payer: Self-pay | Admitting: Pulmonary Disease

## 2017-11-20 DIAGNOSIS — G4733 Obstructive sleep apnea (adult) (pediatric): Secondary | ICD-10-CM

## 2017-11-20 NOTE — Telephone Encounter (Signed)
Per RA, HST showed moderate OSA with 20 events per hour, especially when sleeping on back.   Suggests auto cpap 5-12cm. OV in 4-6 weeks with TP.

## 2017-11-24 ENCOUNTER — Telehealth: Payer: Self-pay | Admitting: Pulmonary Disease

## 2017-11-24 ENCOUNTER — Other Ambulatory Visit: Payer: Self-pay | Admitting: *Deleted

## 2017-11-24 DIAGNOSIS — G4733 Obstructive sleep apnea (adult) (pediatric): Secondary | ICD-10-CM

## 2017-11-24 NOTE — Telephone Encounter (Signed)
Sleep study faxed to [facility] @ [phone]

## 2017-11-24 NOTE — Telephone Encounter (Signed)
Spoke with patient. She is aware of results. Wishes to proceed with CPAP. Order has been placed. Patient wishes to call back once she has the machine to schedule her follow up appt.   Nothing else needed at time of call.

## 2017-11-24 NOTE — Telephone Encounter (Signed)
Order has been sent to [facility]

## 2017-11-24 NOTE — Telephone Encounter (Signed)
PCC's can we send Rx to [facility] instead of [facility]. Please advise.

## 2017-12-04 DIAGNOSIS — G4733 Obstructive sleep apnea (adult) (pediatric): Secondary | ICD-10-CM | POA: Diagnosis not present

## 2017-12-04 DIAGNOSIS — S8292XA Unspecified fracture of left lower leg, initial encounter for closed fracture: Secondary | ICD-10-CM | POA: Diagnosis not present

## 2018-01-04 DIAGNOSIS — G4733 Obstructive sleep apnea (adult) (pediatric): Secondary | ICD-10-CM | POA: Diagnosis not present

## 2018-01-08 IMAGING — DX DG ANKLE COMPLETE 3+V*R*
2 series · 2 of 2 positions shown · non-contrast
Comparison: Right ankle radiographs performed 08/19/2017

CLINICAL DATA: Status post reduction of right ankle trimalleolar
fracture. Initial encounter.

EXAM:
RIGHT ANKLE - COMPLETE 3+ VIEW

[ankle ap]
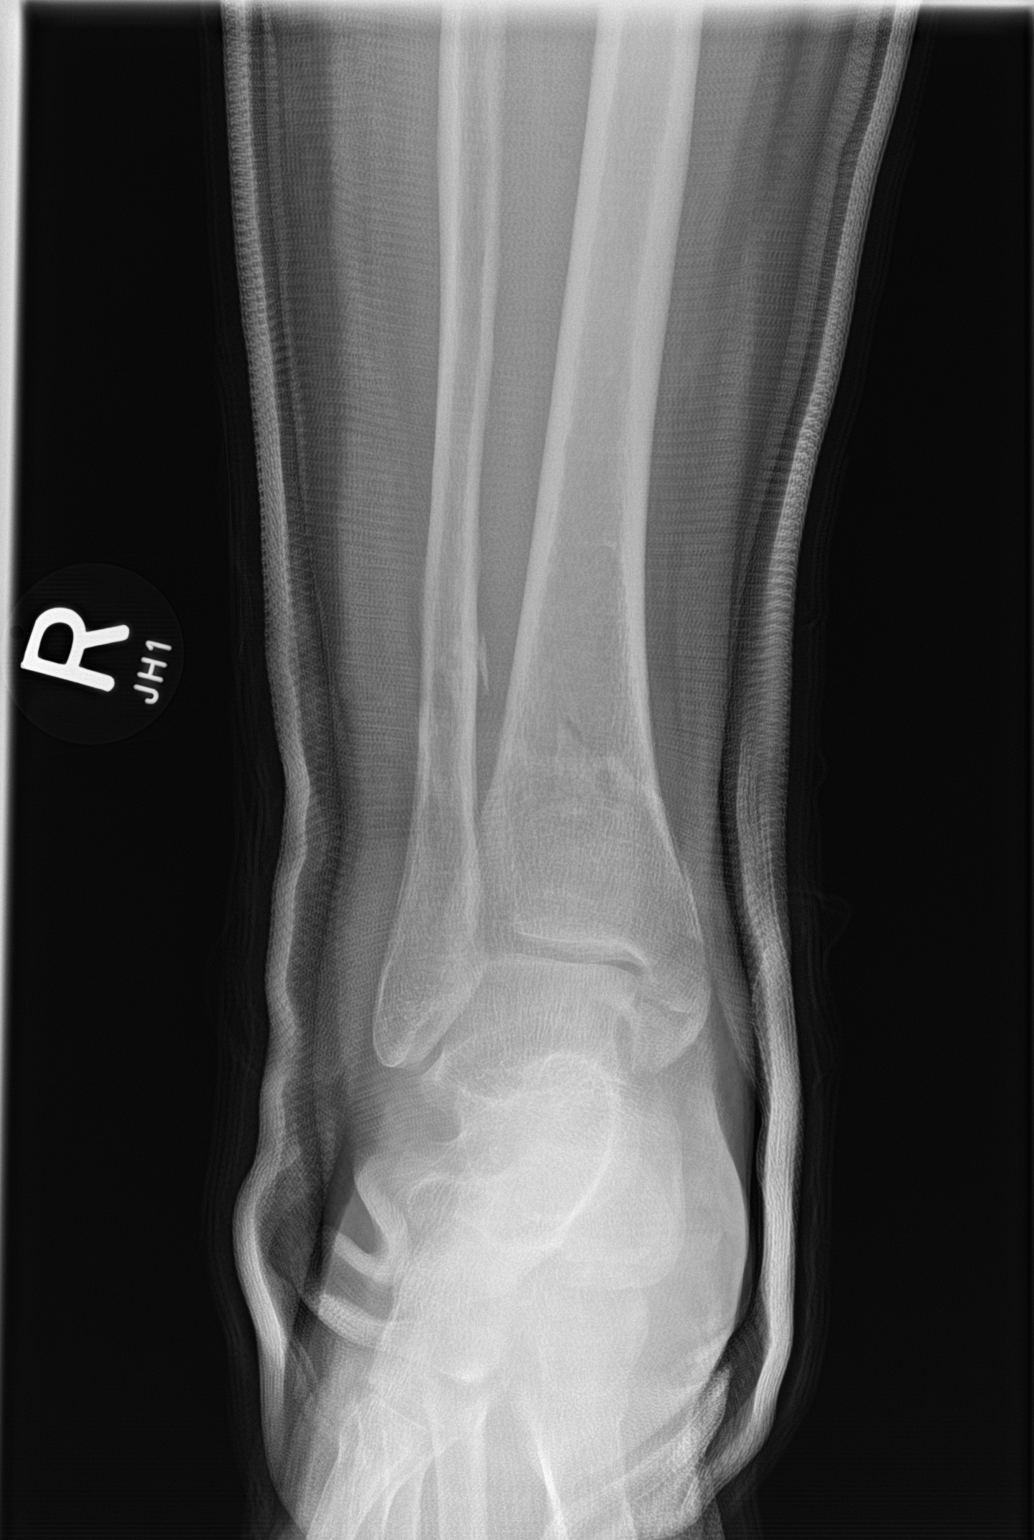

[ankle lat]
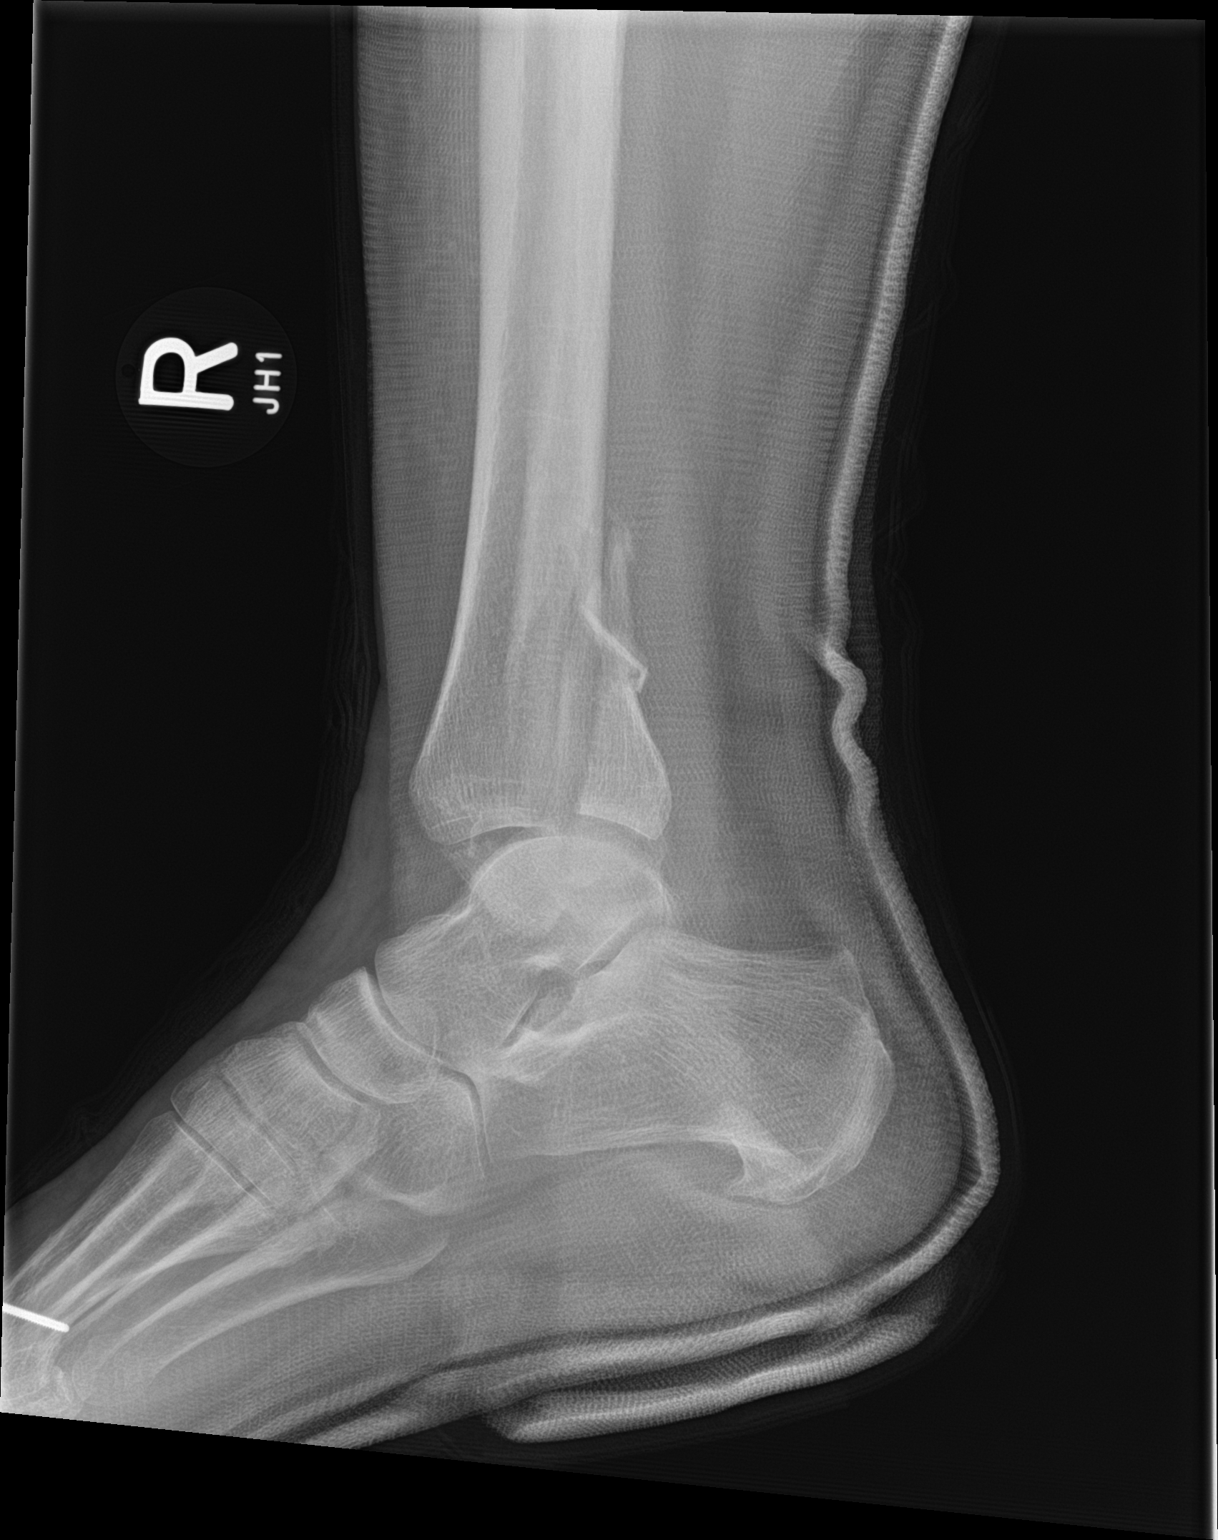

[2 of 2 positions shown; findings below may reference images not displayed]

FINDINGS: There is improved alignment of the trimalleolar fracture of the
right ankle, with residual superior and posterior displacement of
the posterior malleolar fragment. Previously noted subluxation has
resolved. An associated splint is noted. A plantar calcaneal spur is
seen. No new fractures are identified.
IMPRESSION: Improved alignment of trimalleolar fracture of the right ankle.

## 2018-01-08 IMAGING — DX DG ANKLE PORT 2V*R*
2 series · 2 of 2 positions shown · non-contrast
Comparison: 08/19/2017

CLINICAL DATA: Status post open reduction internal fixation of
right ankle fracture.

EXAM:
PORTABLE RIGHT ANKLE - 2 VIEW

[ankle ap]
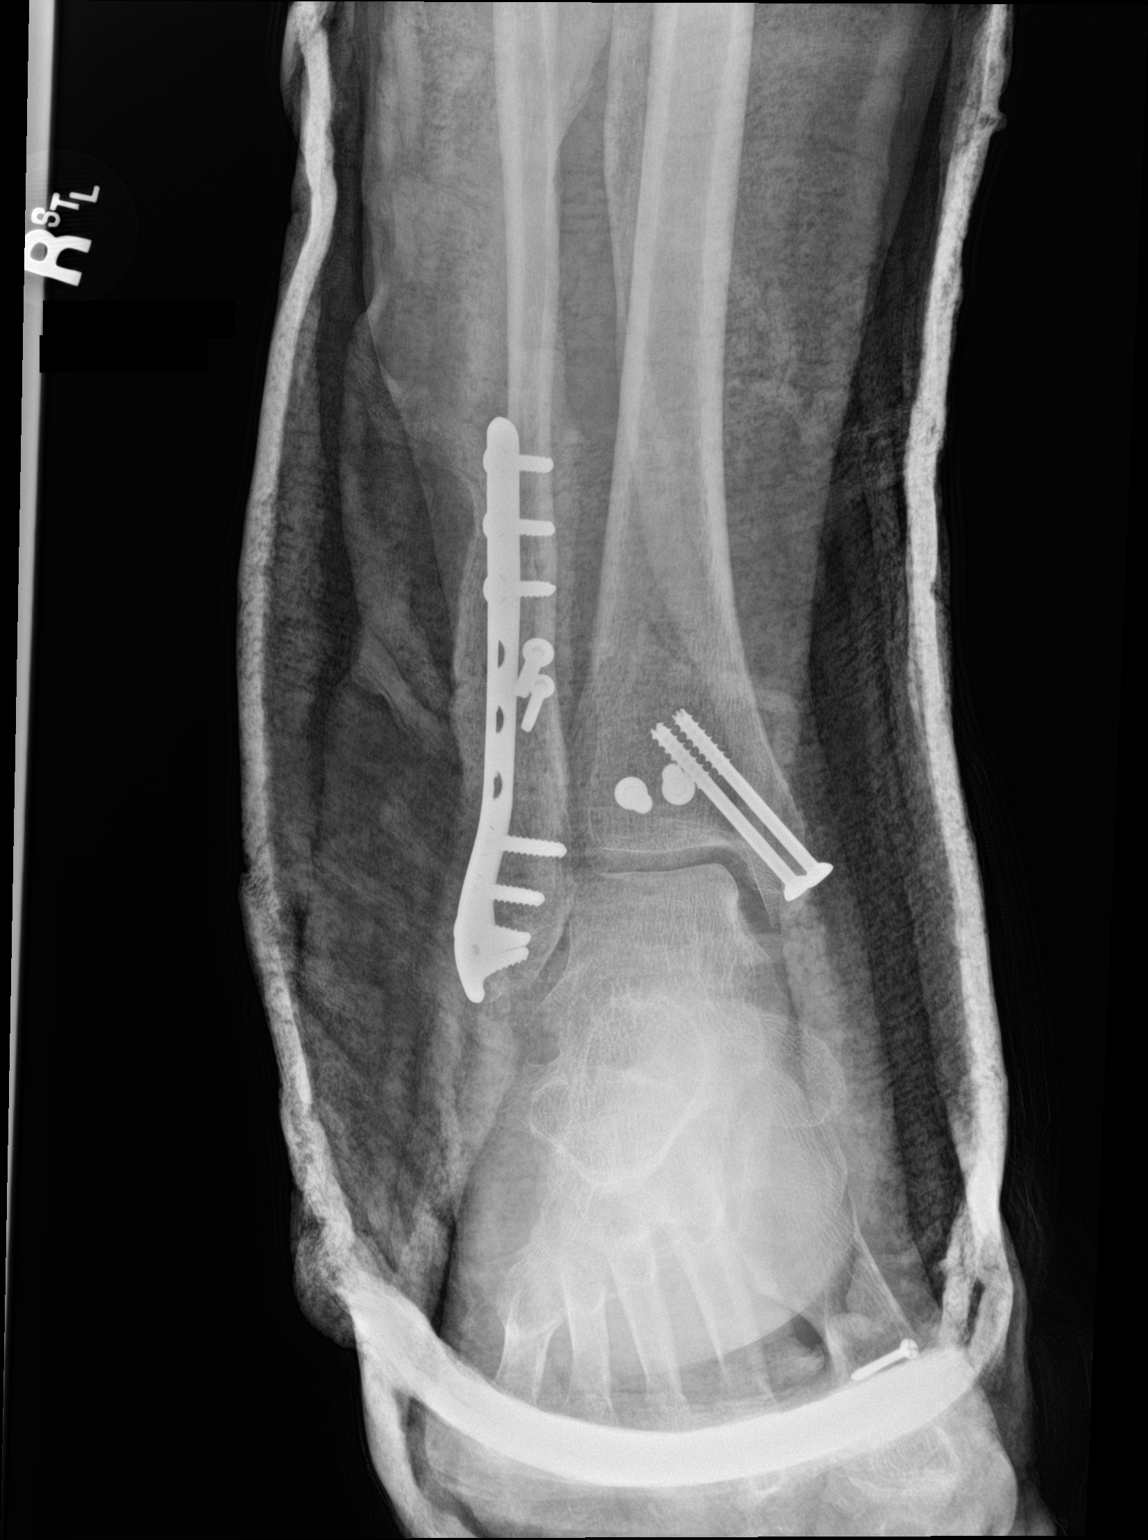

[ankle lat]
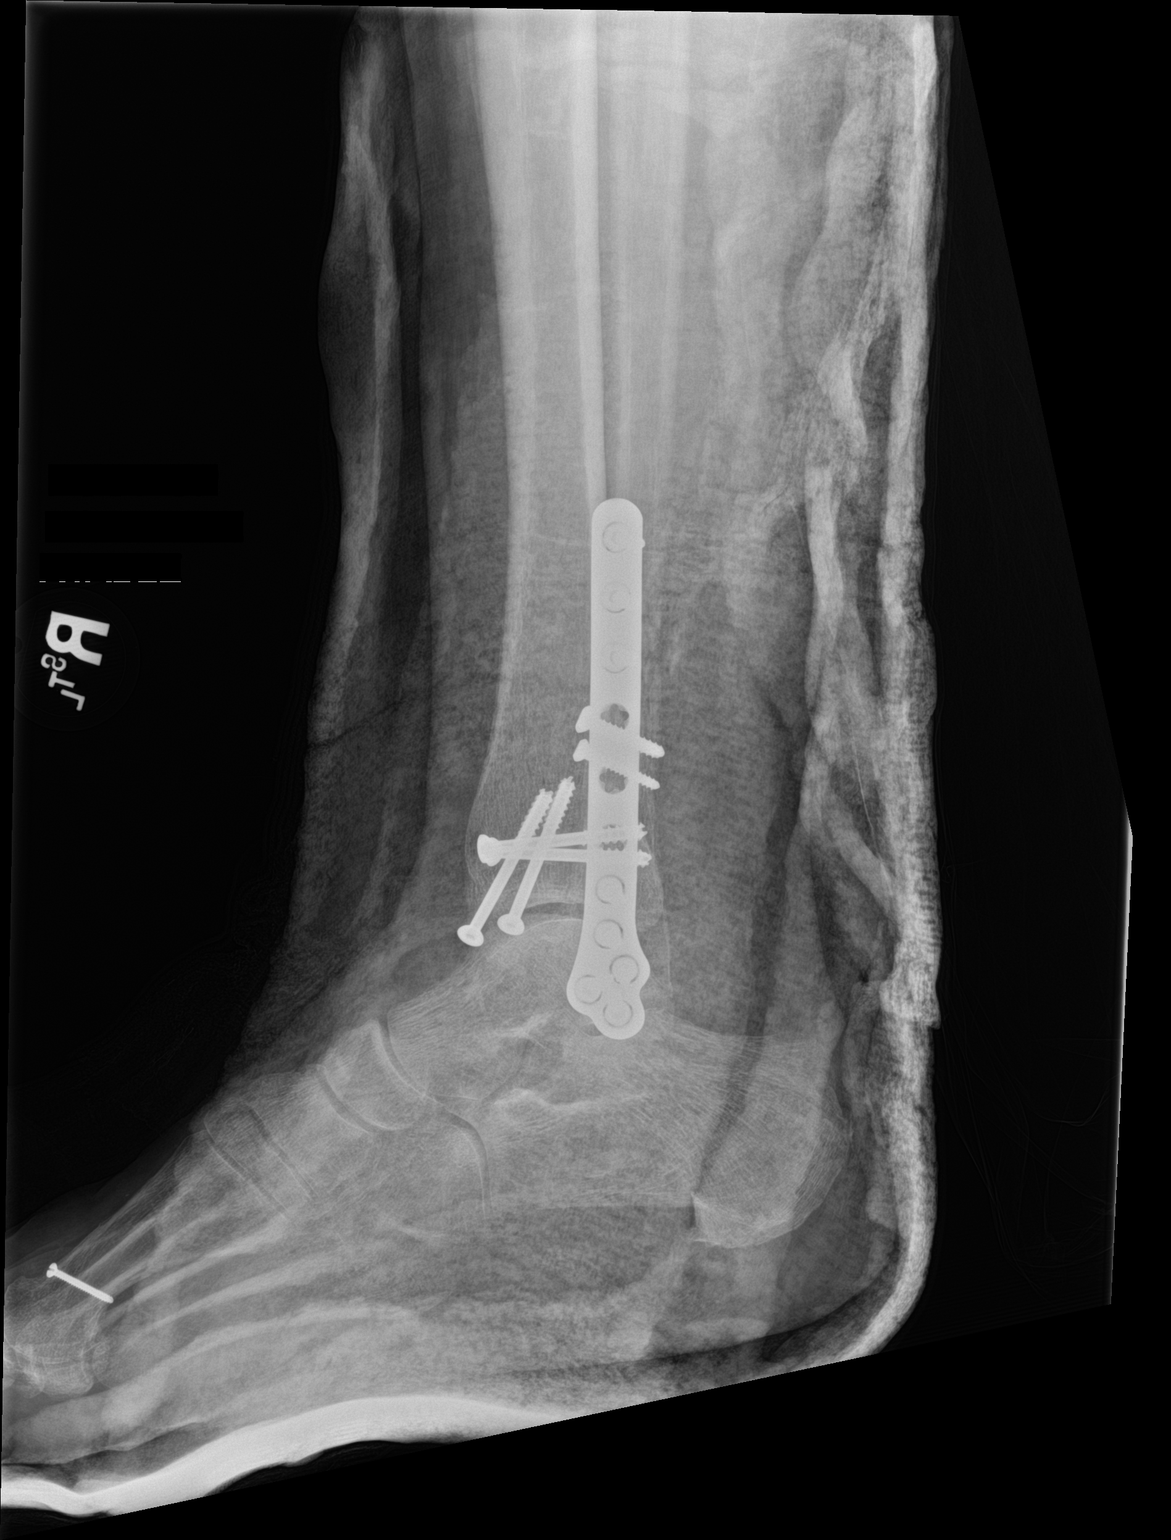

[2 of 2 positions shown; findings below may reference images not displayed]

FINDINGS: Post sideplate and screw fixation of comminuted distal fibular
fracture. Post cancellous screw fixation of comminuted distal tibial
fracture. The alignment is near anatomic. Ankle mortise is mildly
widened medially. Cast material overlies the ankle.
IMPRESSION: Post open reduction and internal fixation of complex trimalleolar
right ankle fracture with near anatomic alignment.

Mild widening of the ankle mortise medially.

## 2018-01-18 DIAGNOSIS — L905 Scar conditions and fibrosis of skin: Secondary | ICD-10-CM | POA: Diagnosis not present

## 2018-01-18 DIAGNOSIS — D1801 Hemangioma of skin and subcutaneous tissue: Secondary | ICD-10-CM | POA: Diagnosis not present

## 2018-01-18 DIAGNOSIS — B078 Other viral warts: Secondary | ICD-10-CM | POA: Diagnosis not present

## 2018-01-18 DIAGNOSIS — L814 Other melanin hyperpigmentation: Secondary | ICD-10-CM | POA: Diagnosis not present

## 2018-01-18 DIAGNOSIS — D225 Melanocytic nevi of trunk: Secondary | ICD-10-CM | POA: Diagnosis not present

## 2018-02-01 DIAGNOSIS — G4733 Obstructive sleep apnea (adult) (pediatric): Secondary | ICD-10-CM | POA: Diagnosis not present

## 2018-03-04 DIAGNOSIS — G4733 Obstructive sleep apnea (adult) (pediatric): Secondary | ICD-10-CM | POA: Diagnosis not present

## 2018-03-19 DIAGNOSIS — G4733 Obstructive sleep apnea (adult) (pediatric): Secondary | ICD-10-CM | POA: Diagnosis not present

## 2018-03-19 DIAGNOSIS — S8292XA Unspecified fracture of left lower leg, initial encounter for closed fracture: Secondary | ICD-10-CM | POA: Diagnosis not present

## 2018-03-28 ENCOUNTER — Encounter: Payer: Self-pay | Admitting: Internal Medicine

## 2018-03-28 ENCOUNTER — Ambulatory Visit: Payer: Federal, State, Local not specified - PPO | Admitting: Internal Medicine

## 2018-03-28 ENCOUNTER — Other Ambulatory Visit (INDEPENDENT_AMBULATORY_CARE_PROVIDER_SITE_OTHER): Payer: Federal, State, Local not specified - PPO

## 2018-03-28 VITALS — BP 128/78 | HR 89 | Temp 98.7°F | Ht 67.75 in | Wt 228.0 lb

## 2018-03-28 DIAGNOSIS — R7302 Impaired glucose tolerance (oral): Secondary | ICD-10-CM

## 2018-03-28 DIAGNOSIS — J309 Allergic rhinitis, unspecified: Secondary | ICD-10-CM | POA: Diagnosis not present

## 2018-03-28 DIAGNOSIS — R300 Dysuria: Secondary | ICD-10-CM | POA: Diagnosis not present

## 2018-03-28 DIAGNOSIS — J019 Acute sinusitis, unspecified: Secondary | ICD-10-CM | POA: Diagnosis not present

## 2018-03-28 LAB — URINALYSIS, ROUTINE W REFLEX MICROSCOPIC
Bilirubin Urine: NEGATIVE
Ketones, ur: NEGATIVE
Nitrite: POSITIVE — AB
SPECIFIC GRAVITY, URINE: 1.02 (ref 1.000–1.030)
TOTAL PROTEIN, URINE-UPE24: NEGATIVE
URINE GLUCOSE: NEGATIVE
UROBILINOGEN UA: 0.2 (ref 0.0–1.0)
pH: 6 (ref 5.0–8.0)

## 2018-03-28 MED ORDER — LEVOFLOXACIN 500 MG PO TABS: 500.0000 mg | ORAL_TABLET | Freq: Every day | ORAL | 0 refills | Status: AC

## 2018-03-28 NOTE — Assessment & Plan Note (Signed)
Likely uti, for urine studies, empiric antbx as above

## 2018-03-28 NOTE — Assessment & Plan Note (Signed)
Mild to mod, for zyrtec prn,  to f/u any worsening symptoms or concern

## 2018-03-28 NOTE — Patient Instructions (Signed)
Please take all new medication as prescribed - the antibiotic  Please continue all other medications as before, and refills have been done if requested.  Please have the pharmacy call with any other refills you may need.  Please keep your appointments with your specialists as you may have planned  Please go to the LAB in the Basement (turn left off the elevator) for the tests to be done today - just the urine testing  You will be contacted by phone if any changes need to be made immediately.  Otherwise, you will receive a letter about your results with an explanation, but please check with MyChart first.  Please remember to sign up for MyChart if you have not done so, as this will be important to you in the future with finding out test results, communicating by private email, and scheduling acute appointments online when needed.   

## 2018-03-28 NOTE — Progress Notes (Signed)
Subjective:    Patient ID: [name], female    DOB: June 13, 1951, 67 y.o.   MRN: [id number]  HPI   Here with 2-3 days acute onset fever, facial pain, pressure, headache, general weakness and malaise, and greenish d/c, with mild ST and cough, but pt denies chest pain, wheezing, increased sob or doe, orthopnea, PND, increased LE swelling, palpitations, dizziness or syncope. Also, Does have several wks ongoing nasal allergy symptoms with clearish congestion, itch and sneezing, without fever, pain, ST, cough, swelling or wheezing.  Also c/o 2-3 days onset dysuria but Denies urinary symptoms such as dysuria, frequency, urgency, flank pain, hematuria or n/v, fever, chills. Past Medical History:  Diagnosis Date  . Abnormal Pap smear of vagina 06/17/15   LGSIL  . Allergic rhinitis, cause unspecified   . Arthritis   . ARTHRITIS, KNEE 02/10/2009   Qualifier: Diagnosis of  By: Niel Hummer MD, Lorinda Creed   . BRCA negative 03/25/13  . Cervical cancer ([place]) 1984  . Cervical disc disease 02/11/2012  . Chronic LBP   . Degenerative arthritis of hip 02/11/2012  . Diverticulosis 02/11/2012  . GERD 08/10/2007   Qualifier: Diagnosis of  By: Sherlynn Stalls, CMA, [place]    . GERD (gastroesophageal reflux disease)   . GERD (gastroesophageal reflux disease)   . HEMORRHOIDS-INTERNAL 07/21/2010   Qualifier: Diagnosis of  By: Chester Holstein NP, Nevin Bloodgood    . Hiatal hernia   . Hyperlipidemia 02/13/2012  . Mitral valve prolapse   . OAB (overactive bladder)   . Obesity   . OVERACTIVE BLADDER 08/10/2007   Qualifier: Diagnosis of  By: Sherlynn Stalls, CMA, [place]    . Pneumonia   . PONV (postoperative nausea and vomiting)   . Recurrent UTI   . SUI (stress urinary incontinence, female)   . VAGINITIS, ATROPHIC 11/11/2008   Qualifier: Diagnosis of  By: Niel Hummer MD, Lorinda Creed    Past Surgical History:  Procedure Laterality Date  . BUNIONECTOMY  2007  . CERVICAL DISCECTOMY  2002  . CHOLECYSTECTOMY N/A 08/13/2013   Procedure: LAPAROSCOPIC  CHOLECYSTECTOMY WITH INTRAOPERATIVE CHOLANGIOGRAM;  Surgeon: Shann Medal, MD;  Location: [facility];  Service: General;  Laterality: N/A;  . COLONOSCOPY    . conization of cervix  1982   with D&C  . CYSTOCELE REPAIR  2003   rectocele repair  . EYE SURGERY Bilateral    lasik  . ORIF ANKLE FRACTURE Right 08/20/2017   Procedure: OPEN REDUCTION INTERNAL FIXATION (ORIF) ANKLE FRACTURE;  Surgeon: Meredith Pel, MD;  Location: [facility];  Service: Orthopedics;  Laterality: Right;  . SHOULDER ARTHROSCOPY  2004   frozen shoulder  . TONSILLECTOMY    . VAGINAL HYSTERECTOMY  1986    reports that she has never smoked. She has never used smokeless tobacco. She reports that she does not drink alcohol or use drugs. family history includes Breast cancer in her mother and sister; Cancer in her maternal grandmother; Diabetes in her brother, maternal aunt, and maternal uncle; Heart disease in her brother and sister. Allergies  Allergen Reactions  . Amoxicillin     REACTION: unspecified  . Penicillins Other (See Comments)    Unknown reaction   Current Outpatient Medications on File Prior to Visit  Medication Sig Dispense Refill  . aspirin EC 81 MG tablet Take 81 mg by mouth daily.    . baclofen (LIORESAL) 10 MG tablet Take 1 tablet (10 mg total) by mouth 3 (three) times daily. 30 each 0  . methocarbamol (ROBAXIN)  500 MG tablet Take 1 tablet (500 mg total) by mouth 2 (two) times daily as needed for muscle spasms. 30 tablet 0  . MULTIPLE VITAMIN tablet Take 1 tablet by mouth daily.    . nabumetone (RELAFEN) 750 MG tablet TAKE 1 TABLET BY MOUTH TWICE A DAY AS NEEDED FOR ARTHRITIS  0  . oxyCODONE (OXY IR/ROXICODONE) 5 MG immediate release tablet Take 1-2 tablets (5-10 mg total) by mouth every 8 (eight) hours as needed for breakthrough pain. 45 tablet 0  . pantoprazole (PROTONIX) 40 MG tablet Take 1 tablet (40 mg total) by mouth daily. 30 tablet 2  . YUVAFEM 10 MCG TABS vaginal tablet PLACE 1 TABLET (10 MCG  TOTAL) VAGINALLY 2 (TWO) TIMES A WEEK. 8 tablet 12   No current facility-administered medications on file prior to visit.    Review of Systems  Constitutional: Negative for other unusual diaphoresis or sweats HENT: Negative for ear discharge or swelling Eyes: Negative for other worsening visual disturbances Respiratory: Negative for stridor or other swelling  Gastrointestinal: Negative for worsening distension or other blood Genitourinary: Negative for retention or other urinary change Musculoskeletal: Negative for other MSK pain or swelling Skin: Negative for color change or other new lesions Neurological: Negative for worsening tremors and other numbness  Psychiatric/Behavioral: Negative for worsening agitation or other fatigue All other system neg per pt    Objective:   Physical Exam BP 128/78   Pulse 89   Temp 98.7 F (37.1 C) (Oral)   Ht 5' 7.75" (1.721 m)   Wt 228 lb (103.4 kg)   SpO2 95%   BMI 34.92 kg/m  VS noted, mild ill Constitutional: Pt appears in NAD HENT: Head: NCAT.  Right Ear: External ear normal.  Left Ear: External ear normal.  Bilat tm's with mild erythema.  Max sinus areas mild tender.  Pharynx with mild erythema, no exudate Eyes: . Pupils are equal, round, and reactive to light. Conjunctivae and EOM are normal Nose: without d/c or deformity Neck: Neck supple. Gross normal ROM Cardiovascular: Normal rate and regular rhythm.   Pulmonary/Chest: Effort normal and breath sounds without rales or wheezing.  Abd:  Soft, ND, + BS, no organomegaly with low mid abd tender, without guarding or rebound, no flank tender Neurological: Pt is alert. At baseline orientation, motor grossly intact Skin: Skin is warm. No rashes, other new lesions, no LE edema Psychiatric: Pt behavior is normal without agitation  No other exam findings    Assessment & Plan:

## 2018-03-28 NOTE — Assessment & Plan Note (Signed)
stable overall by history and exam, recent data reviewed with pt, and pt to continue medical treatment as before,  to f/u any worsening symptoms or concerns Lab Results  Component Value Date   HGBA1C 5.7 07/21/2017   

## 2018-03-28 NOTE — Assessment & Plan Note (Signed)
Mild to mod, for antibx course,  to f/u any worsening symptoms or concerns 

## 2018-03-30 LAB — URINE CULTURE
MICRO NUMBER:: 90622437
SPECIMEN QUALITY:: ADEQUATE

## 2018-04-03 DIAGNOSIS — G4733 Obstructive sleep apnea (adult) (pediatric): Secondary | ICD-10-CM | POA: Diagnosis not present

## 2018-04-23 ENCOUNTER — Telehealth (INDEPENDENT_AMBULATORY_CARE_PROVIDER_SITE_OTHER): Payer: Self-pay | Admitting: Orthopedic Surgery

## 2018-04-23 NOTE — Telephone Encounter (Signed)
[name] from [facility] called wanting to ask for a renewal on a cpap machine. CB #  [phone]. [phone]

## 2018-04-23 NOTE — Telephone Encounter (Signed)
y

## 2018-04-23 NOTE — Telephone Encounter (Signed)
Shouldn't this come from patients PCP?

## 2018-04-24 NOTE — Telephone Encounter (Signed)
IC LM advising should contact PCP for this.

## 2018-05-03 ENCOUNTER — Ambulatory Visit (INDEPENDENT_AMBULATORY_CARE_PROVIDER_SITE_OTHER)
Admission: RE | Admit: 2018-05-03 | Discharge: 2018-05-03 | Disposition: A | Discharge status: Home / Self Care | Payer: Federal, State, Local not specified - PPO | Source: Ambulatory Visit | Attending: Internal Medicine | Admitting: Internal Medicine

## 2018-05-03 ENCOUNTER — Ambulatory Visit (INDEPENDENT_AMBULATORY_CARE_PROVIDER_SITE_OTHER): Payer: Federal, State, Local not specified - PPO | Admitting: Internal Medicine

## 2018-05-03 ENCOUNTER — Encounter: Payer: Self-pay | Admitting: Internal Medicine

## 2018-05-03 VITALS — BP 126/84 | HR 78 | Temp 97.6°F | Ht 67.5 in | Wt 224.0 lb

## 2018-05-03 DIAGNOSIS — Z9109 Other allergy status, other than to drugs and biological substances: Secondary | ICD-10-CM | POA: Diagnosis not present

## 2018-05-03 DIAGNOSIS — J309 Allergic rhinitis, unspecified: Secondary | ICD-10-CM | POA: Diagnosis not present

## 2018-05-03 DIAGNOSIS — R7302 Impaired glucose tolerance (oral): Secondary | ICD-10-CM | POA: Diagnosis not present

## 2018-05-03 DIAGNOSIS — R059 Cough, unspecified: Secondary | ICD-10-CM | POA: Insufficient documentation

## 2018-05-03 DIAGNOSIS — R05 Cough: Secondary | ICD-10-CM

## 2018-05-03 MED ORDER — PANTOPRAZOLE SODIUM 40 MG PO TBEC: 40.0000 mg | DELAYED_RELEASE_TABLET | Freq: Every day | ORAL | 3 refills | Status: DC

## 2018-05-03 MED ORDER — PREDNISONE 10 MG PO TABS: ORAL_TABLET | ORAL | 0 refills | Status: DC

## 2018-05-03 MED ORDER — HYDROCODONE-HOMATROPINE 5-1.5 MG/5ML PO SYRP: 5.0000 mL | ORAL_SOLUTION | Freq: Four times a day (QID) | ORAL | 0 refills | Status: AC | PRN

## 2018-05-03 MED ORDER — NABUMETONE 750 MG PO TABS: ORAL_TABLET | ORAL | 3 refills | Status: DC

## 2018-05-03 MED ORDER — METHYLPREDNISOLONE ACETATE 80 MG/ML IJ SUSP
80.0000 mg | Freq: Once | INTRAMUSCULAR | Status: AC
  Administered 2018-05-03: 80 mg via INTRAMUSCULAR

## 2018-05-03 NOTE — Assessment & Plan Note (Signed)
Mild to mod seasonal flare likely, for depomedrol IM 80, predpac asd,  to f/u any worsening symptoms or concerns

## 2018-05-03 NOTE — Patient Instructions (Signed)
You had the steroid shot today  Please take all new medication as prescribed - the prednisone, and cough medicine if needed  Please continue all other medications as before, and refills have been done if requested.  Please have the pharmacy call with any other refills you may need.  Please keep your appointments with your specialists as you may have planned  Please go to the XRAY Department in the Basement (go straight as you get off the elevator) for the x-ray testing  You will be contacted by phone if any changes need to be made immediately.  Otherwise, you will receive a letter about your results with an explanation, but please check with MyChart first.  Please remember to sign up for MyChart if you have not done so, as this will be important to you in the future with finding out test results, communicating by private email, and scheduling acute appointments online when needed.

## 2018-05-03 NOTE — Assessment & Plan Note (Signed)
D/w pt, etiology not clear but possibly due to post nasal gtt, ok for cough med qhs to be able to wear the CPAP, also check cxr

## 2018-05-03 NOTE — Progress Notes (Signed)
Subjective:    Patient ID: [name], female    DOB: 11/27/1950, 67 y.o.   MRN: [id number]  HPI  Here with 3 wks onset cough that had improved after last visit may 22 with tx for sinusitis, this time no fever, sinus pain or congestion, no ear pain, but has some scratchy throat pain and anterior neck pain worse with recurring scant prod cough that just wont quit, now has not been able to use the CPAP for 1 wk due to coughing.   Pt denies chest pain, increased sob or doe, wheezing, orthopnea, PND, increased LE swelling, palpitations, dizziness or syncope. Pt denies new neurological symptoms such as new headache, or facial or extremity weakness or numbness   Pt denies polydipsia, polyuria Past Medical History:  Diagnosis Date  . Abnormal Pap smear of vagina 06/17/15   LGSIL  . Allergic rhinitis, cause unspecified   . Arthritis   . ARTHRITIS, KNEE 02/10/2009   Qualifier: Diagnosis of  By: Niel Hummer MD, Lorinda Creed   . BRCA negative 03/25/13  . Cervical cancer ([place]) 1984  . Cervical disc disease 02/11/2012  . Chronic LBP   . Degenerative arthritis of hip 02/11/2012  . Diverticulosis 02/11/2012  . GERD 08/10/2007   Qualifier: Diagnosis of  By: Sherlynn Stalls, CMA, [place]    . GERD (gastroesophageal reflux disease)   . GERD (gastroesophageal reflux disease)   . HEMORRHOIDS-INTERNAL 07/21/2010   Qualifier: Diagnosis of  By: Chester Holstein NP, Nevin Bloodgood    . Hiatal hernia   . Hyperlipidemia 02/13/2012  . Mitral valve prolapse   . OAB (overactive bladder)   . Obesity   . OVERACTIVE BLADDER 08/10/2007   Qualifier: Diagnosis of  By: Sherlynn Stalls, CMA, [place]    . Pneumonia   . PONV (postoperative nausea and vomiting)   . Recurrent UTI   . SUI (stress urinary incontinence, female)   . VAGINITIS, ATROPHIC 11/11/2008   Qualifier: Diagnosis of  By: Niel Hummer MD, Lorinda Creed    Past Surgical History:  Procedure Laterality Date  . BUNIONECTOMY  2007  . CERVICAL DISCECTOMY  2002  . CHOLECYSTECTOMY N/A 08/13/2013   Procedure:  LAPAROSCOPIC CHOLECYSTECTOMY WITH INTRAOPERATIVE CHOLANGIOGRAM;  Surgeon: Shann Medal, MD;  Location: [facility];  Service: General;  Laterality: N/A;  . COLONOSCOPY    . conization of cervix  1982   with D&C  . CYSTOCELE REPAIR  2003   rectocele repair  . EYE SURGERY Bilateral    lasik  . ORIF ANKLE FRACTURE Right 08/20/2017   Procedure: OPEN REDUCTION INTERNAL FIXATION (ORIF) ANKLE FRACTURE;  Surgeon: Meredith Pel, MD;  Location[facility];  Service: Orthopedics;  Laterality: Right;  . SHOULDER ARTHROSCOPY  2004   frozen shoulder  . TONSILLECTOMY    . VAGINAL HYSTERECTOMY  1986    reports that she has never smoked. She has never used smokeless tobacco. She reports that she does not drink alcohol or use drugs. family history includes Breast cancer in her mother and sister; Cancer in her maternal grandmother; Diabetes in her brother, maternal aunt, and maternal uncle; Heart disease in her brother and sister. Allergies  Allergen Reactions  . Amoxicillin     REACTION: unspecified  . Penicillins Other (See Comments)    Unknown reaction   Current Outpatient Medications on File Prior to Visit  Medication Sig Dispense Refill  . aspirin EC 81 MG tablet Take 81 mg by mouth daily.    . baclofen (LIORESAL) 10 MG tablet Take 1  tablet (10 mg total) by mouth 3 (three) times daily. 30 each 0  . methocarbamol (ROBAXIN) 500 MG tablet Take 1 tablet (500 mg total) by mouth 2 (two) times daily as needed for muscle spasms. 30 tablet 0  . MULTIPLE VITAMIN tablet Take 1 tablet by mouth daily.    Merril Abbe 10 MCG TABS vaginal tablet PLACE 1 TABLET (10 MCG TOTAL) VAGINALLY 2 (TWO) TIMES A WEEK. 8 tablet 12   No current facility-administered medications on file prior to visit.    Review of Systems  Constitutional: Negative for other unusual diaphoresis or sweats HENT: Negative for ear discharge or swelling Eyes: Negative for other worsening visual disturbances Respiratory: Negative for stridor or  other swelling  Gastrointestinal: Negative for worsening distension or other blood Genitourinary: Negative for retention or other urinary change Musculoskeletal: Negative for other MSK pain or swelling Skin: Negative for color change or other new lesions Neurological: Negative for worsening tremors and other numbness  Psychiatric/Behavioral: Negative for worsening agitation or other fatigue All other system neg per pt    Objective:   Physical Exam BP 126/84   Pulse 78   Temp 97.6 F (36.4 C) (Oral)   Ht 5' 7.5" (1.715 m)   Wt 224 lb (101.6 kg)   SpO2 94%   BMI 34.57 kg/m  VS noted, not ill appearing Constitutional: Pt appears in NAD HENT: Head: NCAT.  Right Ear: External ear normal.  Left Ear: External ear normal. Left TM with mild erythema, no effusion  Bilat tm's with mild erythema.  Max sinus areas non tender.  Pharynx with mild erythema, no exudate Eyes: . Pupils are equal, round, and reactive to light. Conjunctivae and EOM are normal Nose: without d/c or deformity Neck: Neck supple. Gross normal ROM Cardiovascular: Normal rate and regular rhythm.   Pulmonary/Chest: Effort normal and breath sounds decreased without rales or wheezing.  Neurological: Pt is alert. At baseline orientation, motor grossly intact Skin: Skin is warm. No rashes, other new lesions, no LE edema Psychiatric: Pt behavior is normal without agitation  No other exam findings Lab Results  Component Value Date   WBC 13.0 (H) 08/19/2017   HGB 12.7 08/19/2017   HCT 38.2 08/19/2017   PLT 231 08/19/2017   GLUCOSE 119 (H) 08/19/2017   CHOL 160 07/21/2017   TRIG 87.0 07/21/2017   HDL 48.60 07/21/2017   LDLDIRECT 135.1 05/08/2012   LDLCALC 94 07/21/2017   ALT 22 07/21/2017   AST 19 07/21/2017   NA 142 08/19/2017   K 3.6 08/19/2017   CL 108 08/19/2017   CREATININE 0.96 08/19/2017   BUN 22 (H) 08/19/2017   CO2 26 08/19/2017   TSH 2.53 07/21/2017   HGBA1C 5.7 07/21/2017       Assessment & Plan:

## 2018-05-03 NOTE — Assessment & Plan Note (Signed)
stable overall by history and exam, recent data reviewed with pt, and pt to continue medical treatment as before,  to f/u any worsening symptoms or concerns Lab Results  Component Value Date   HGBA1C 5.7 07/21/2017

## 2018-05-04 DIAGNOSIS — G4733 Obstructive sleep apnea (adult) (pediatric): Secondary | ICD-10-CM | POA: Diagnosis not present

## 2018-05-16 ENCOUNTER — Encounter: Payer: Self-pay | Admitting: Internal Medicine

## 2018-05-16 DIAGNOSIS — M8589 Other specified disorders of bone density and structure, multiple sites: Secondary | ICD-10-CM | POA: Diagnosis not present

## 2018-05-16 DIAGNOSIS — Z803 Family history of malignant neoplasm of breast: Secondary | ICD-10-CM | POA: Diagnosis not present

## 2018-05-16 DIAGNOSIS — Z1231 Encounter for screening mammogram for malignant neoplasm of breast: Secondary | ICD-10-CM | POA: Diagnosis not present

## 2018-05-16 LAB — HM MAMMOGRAPHY

## 2018-06-03 ENCOUNTER — Other Ambulatory Visit: Payer: Self-pay

## 2018-06-03 ENCOUNTER — Encounter (HOSPITAL_BASED_OUTPATIENT_CLINIC_OR_DEPARTMENT_OTHER): Payer: Self-pay | Admitting: Emergency Medicine

## 2018-06-03 ENCOUNTER — Emergency Department (HOSPITAL_BASED_OUTPATIENT_CLINIC_OR_DEPARTMENT_OTHER): Payer: Federal, State, Local not specified - PPO

## 2018-06-03 ENCOUNTER — Emergency Department (HOSPITAL_BASED_OUTPATIENT_CLINIC_OR_DEPARTMENT_OTHER)
Admission: EM | Admit: 2018-06-03 | Discharge: 2018-06-03 | Disposition: A | Discharge status: Home / Self Care | Payer: Federal, State, Local not specified - PPO | Attending: Emergency Medicine | Admitting: Emergency Medicine

## 2018-06-03 DIAGNOSIS — S92354A Nondisplaced fracture of fifth metatarsal bone, right foot, initial encounter for closed fracture: Secondary | ICD-10-CM

## 2018-06-03 DIAGNOSIS — Y929 Unspecified place or not applicable: Secondary | ICD-10-CM | POA: Diagnosis not present

## 2018-06-03 DIAGNOSIS — G4733 Obstructive sleep apnea (adult) (pediatric): Secondary | ICD-10-CM | POA: Diagnosis not present

## 2018-06-03 DIAGNOSIS — Z7982 Long term (current) use of aspirin: Secondary | ICD-10-CM | POA: Insufficient documentation

## 2018-06-03 DIAGNOSIS — S99921A Unspecified injury of right foot, initial encounter: Secondary | ICD-10-CM | POA: Diagnosis not present

## 2018-06-03 DIAGNOSIS — Z79899 Other long term (current) drug therapy: Secondary | ICD-10-CM | POA: Diagnosis not present

## 2018-06-03 DIAGNOSIS — Y33XXXA Other specified events, undetermined intent, initial encounter: Secondary | ICD-10-CM | POA: Insufficient documentation

## 2018-06-03 DIAGNOSIS — Y998 Other external cause status: Secondary | ICD-10-CM | POA: Diagnosis not present

## 2018-06-03 DIAGNOSIS — E785 Hyperlipidemia, unspecified: Secondary | ICD-10-CM | POA: Diagnosis not present

## 2018-06-03 DIAGNOSIS — Y939 Activity, unspecified: Secondary | ICD-10-CM | POA: Diagnosis not present

## 2018-06-03 MED ORDER — HYDROCODONE-ACETAMINOPHEN 5-325 MG PO TABS
2.0000 | ORAL_TABLET | ORAL | 0 refills | Status: DC | PRN
Start: 2018-06-03 — End: 2018-07-06

## 2018-06-03 MED ORDER — ACETAMINOPHEN 325 MG PO TABS
650.0000 mg | ORAL_TABLET | Freq: Once | ORAL | Status: AC
  Administered 2018-06-03: 650 mg via ORAL
  Filled 2018-06-03: qty 2

## 2018-06-03 NOTE — ED Triage Notes (Signed)
Patient states that she turned her right foot when getting into the car  - patient points to the outside of her foot with the pain

## 2018-06-06 ENCOUNTER — Encounter (INDEPENDENT_AMBULATORY_CARE_PROVIDER_SITE_OTHER): Payer: Self-pay | Admitting: Orthopaedic Surgery

## 2018-06-06 ENCOUNTER — Ambulatory Visit (INDEPENDENT_AMBULATORY_CARE_PROVIDER_SITE_OTHER): Payer: Federal, State, Local not specified - PPO | Admitting: Orthopaedic Surgery

## 2018-06-06 DIAGNOSIS — S92354A Nondisplaced fracture of fifth metatarsal bone, right foot, initial encounter for closed fracture: Secondary | ICD-10-CM

## 2018-06-06 NOTE — Progress Notes (Signed)
Office Visit Note   Patient: [name]           Date of Birth: 10/02/1951           MRN: [id number] Visit Date: 06/06/2018              Requested by: Biagio Borg, MD [address] [place], [place] [address] PCP: Biagio Borg, MD   Assessment & Plan: Visit Diagnoses:  1. Closed nondisplaced fracture of fifth metatarsal bone of right foot, initial encounter     Plan: I have her continue the cam walker boot weightbearing as tolerated.  She does not need to sleep in the boot.  Discussed with her no driving in the boot.  She will follow with [place] in 2 weeks and will obtain 3 views of the right foot at that time.  Discussed with her that will take probably 8 weeks for this to completely heal.  We will transition her over to a postop shoe pain and radiographic evidence of lateral.    Follow-Up Instructions: Return in about 2 weeks (around 06/20/2018) for Radiographs.   Orders:  No orders of the defined types were placed in this encounter.  No orders of the defined types were placed in this encounter.     Procedures: No procedures performed   Clinical Data: No additional findings.   Subjective: Chief Complaint  Patient presents with  . Right Foot - Fracture    HPI [name] is a 67 year old female comes in today for right foot fifth metatarsal fracture.  She turned her foot over in the driveway getting car.  She had states she had no other injury in the foot.  She denies any chest pain joints breath fevers chills or dizziness at the time of the injury.  Seen in the ER where radiographs of the right foot were obtained and show a nondisplaced base of the fifth metatarsal fracture.  She is placed in a cam walker boot appropriately.  She does not use any tobacco products. Review of Systems Please see HPI otherwise negative  Objective: Vital Signs: There were no vitals taken for this visit.  Physical Exam  Constitutional: She is oriented to person, place, and time. She  appears well-developed and well-nourished. No distress.  Cardiovascular: Intact distal pulses.  Pulmonary/Chest: Effort normal.  Neurological: She is alert and oriented to person, place, and time.  Skin: She is not diaphoretic.  Psychiatric: She has a normal mood and affect.    Ortho Exam Tenderness over the base of the fifth metatarsal.  Remainder the foot is nontender.  There is no rashes skin lesions ulcerations ecchymosis erythema or significant edema.  Nontender right calf.  No tenderness over the right ankle good dorsiflexion plantarflexion of the right ankle without pain.  Well-healed surgical incisions over the lateral and medial malleoli. Specialty Comments:  No specialty comments available.  Imaging: No results found.   PMFS History: Patient Active Problem List   Diagnosis Date Noted  . Cough 05/03/2018  . Dysuria 03/28/2018  . Acute sinus infection 03/28/2018  . Trimalleolar fracture 08/20/2017  . Ankle fracture 08/20/2017  . Sleep-disordered breathing 07/30/2017  . Right hip pain 07/28/2017  . Abnormal TSH 07/21/2015  . URI (upper respiratory infection) 04/17/2015  . Greater trochanteric bursitis of right hip 08/04/2014  . Localized osteoarthrosis, lower leg 08/04/2014  . Biliary colic 61/44/3154  . Cholelithiasis 08/07/2013  . Left otitis media 12/07/2012  . Hyperlipidemia 02/13/2012  .  Impaired glucose tolerance 02/13/2012  . Preventative health care 02/11/2012  . Diverticulosis 02/11/2012  . Degenerative arthritis of hip 02/11/2012  . Cervical disc disease 02/11/2012  . Cervical cancer ([place])   . Allergic rhinitis   . Chronic LBP   . Obesity   . Blood in stool 08/31/2010  . HEMORRHOIDS-INTERNAL 07/21/2010  . CONSTIPATION 07/19/2010  . ARTHRITIS, KNEE 02/10/2009  . VAGINITIS, ATROPHIC 11/11/2008  . EXOGENOUS OBESITY 07/29/2008  . GERD 08/10/2007  . OVERACTIVE BLADDER 08/10/2007   Past Medical History:  Diagnosis Date  . Abnormal Pap smear of vagina  06/17/15   LGSIL  . Allergic rhinitis, cause unspecified   . Arthritis   . ARTHRITIS, KNEE 02/10/2009   Qualifier: Diagnosis of  By: Niel Hummer MD, Lorinda Creed   . BRCA negative 03/25/13  . Cervical cancer ([place]) 1984  . Cervical disc disease 02/11/2012  . Chronic LBP   . Degenerative arthritis of hip 02/11/2012  . Diverticulosis 02/11/2012  . GERD 08/10/2007   Qualifier: Diagnosis of  By: Sherlynn Stalls, CMA, [place]    . GERD (gastroesophageal reflux disease)   . GERD (gastroesophageal reflux disease)   . HEMORRHOIDS-INTERNAL 07/21/2010   Qualifier: Diagnosis of  By: Chester Holstein NP, Nevin Bloodgood    . Hiatal hernia   . Hyperlipidemia 02/13/2012  . Mitral valve prolapse   . OAB (overactive bladder)   . Obesity   . OVERACTIVE BLADDER 08/10/2007   Qualifier: Diagnosis of  By: Sherlynn Stalls, CMA, [place]    . Pneumonia   . PONV (postoperative nausea and vomiting)   . Recurrent UTI   . SUI (stress urinary incontinence, female)   . VAGINITIS, ATROPHIC 11/11/2008   Qualifier: Diagnosis of  By: Niel Hummer MD, Lorinda Creed     Family History  Problem Relation Age of Onset  . Breast cancer Mother        dx in her last 58's  . Heart disease Sister   . Breast cancer Sister        dx in her late 77's  . Diabetes Brother   . Heart disease Brother        triple bypass  . Diabetes Maternal Aunt   . Diabetes Maternal Uncle   . Cancer Maternal Grandmother        uterine  . Colon cancer Neg Hx   . Rectal cancer Neg Hx   . Esophageal cancer Neg Hx     Past Surgical History:  Procedure Laterality Date  . BUNIONECTOMY  2007  . CERVICAL DISCECTOMY  2002  . CHOLECYSTECTOMY N/A 08/13/2013   Procedure: LAPAROSCOPIC CHOLECYSTECTOMY WITH INTRAOPERATIVE CHOLANGIOGRAM;  Surgeon: Shann Medal, MD;  Location: [facility];  Service: General;  Laterality: N/A;  . COLONOSCOPY    . conization of cervix  1982   with D&C  . CYSTOCELE REPAIR  2003   rectocele repair  . EYE SURGERY Bilateral    lasik  . ORIF ANKLE FRACTURE Right 08/20/2017    Procedure: OPEN REDUCTION INTERNAL FIXATION (ORIF) ANKLE FRACTURE;  Surgeon: Meredith Pel, MD;  Location: [facility];  Service: Orthopedics;  Laterality: Right;  . SHOULDER ARTHROSCOPY  2004   frozen shoulder  . TONSILLECTOMY    . VAGINAL HYSTERECTOMY  1986   Social History   Occupational History    Employer: [employer]  Tobacco Use  . Smoking status: Never Smoker  . Smokeless tobacco: Never Used  Substance and Sexual Activity  . Alcohol use: No    Alcohol/week: 0.0 oz  .  Drug use: No  . Sexual activity: Yes    Partners: Male    Birth control/protection: Post-menopausal    Comment: hysterectomy

## 2018-06-07 ENCOUNTER — Telehealth (INDEPENDENT_AMBULATORY_CARE_PROVIDER_SITE_OTHER): Payer: Self-pay | Admitting: Orthopaedic Surgery

## 2018-06-07 NOTE — Telephone Encounter (Signed)
That will be fine. 

## 2018-06-07 NOTE — Telephone Encounter (Signed)
Pt Request  Handicap Placard

## 2018-06-07 NOTE — Telephone Encounter (Signed)
Ok

## 2018-06-08 NOTE — Telephone Encounter (Signed)
LMOM for patient letting her know handicap paper will be ready for her Monday morning

## 2018-06-10 NOTE — ED Provider Notes (Signed)
[facility] EMERGENCY DEPARTMENT Provider Note   CSN: [id number] Arrival date & time: 06/03/18  1924     History   Chief Complaint Chief Complaint  Patient presents with  . Foot Pain    HPI [name] is a 67 y.o. female.  HPI   67 year old female with right foot pain.  Patient states that she twisted her foot when getting into the car.  She had pain primarily from the lateral aspect of the foot.  She can bear minimal weight with a lot of pain.  Denies any acute injury.  Reports prior surgery to her foot/ankle.  Past Medical History:  Diagnosis Date  . Abnormal Pap smear of vagina 06/17/15   LGSIL  . Allergic rhinitis, cause unspecified   . Arthritis   . ARTHRITIS, KNEE 02/10/2009   Qualifier: Diagnosis of  By: Niel Hummer MD, Lorinda Creed   . BRCA negative 03/25/13  . Cervical cancer ([place]) 1984  . Cervical disc disease 02/11/2012  . Chronic LBP   . Degenerative arthritis of hip 02/11/2012  . Diverticulosis 02/11/2012  . GERD 08/10/2007   Qualifier: Diagnosis of  By: Sherlynn Stalls, CMA, [place]    . GERD (gastroesophageal reflux disease)   . GERD (gastroesophageal reflux disease)   . HEMORRHOIDS-INTERNAL 07/21/2010   Qualifier: Diagnosis of  By: Chester Holstein NP, Nevin Bloodgood    . Hiatal hernia   . Hyperlipidemia 02/13/2012  . Mitral valve prolapse   . OAB (overactive bladder)   . Obesity   . OVERACTIVE BLADDER 08/10/2007   Qualifier: Diagnosis of  By: Sherlynn Stalls, CMA, [place]    . Pneumonia   . PONV (postoperative nausea and vomiting)   . Recurrent UTI   . SUI (stress urinary incontinence, female)   . VAGINITIS, ATROPHIC 11/11/2008   Qualifier: Diagnosis of  By: Niel Hummer MD, Lorinda Creed     Patient Active Problem List   Diagnosis Date Noted  . Cough 05/03/2018  . Dysuria 03/28/2018  . Acute sinus infection 03/28/2018  . Trimalleolar fracture 08/20/2017  . Ankle fracture 08/20/2017  . Sleep-disordered breathing 07/30/2017  . Right hip pain 07/28/2017  . Abnormal TSH 07/21/2015  . URI  (upper respiratory infection) 04/17/2015  . Greater trochanteric bursitis of right hip 08/04/2014  . Localized osteoarthrosis, lower leg 08/04/2014  . Biliary colic 65/01/5464  . Cholelithiasis 08/07/2013  . Left otitis media 12/07/2012  . Hyperlipidemia 02/13/2012  . Impaired glucose tolerance 02/13/2012  . Preventative health care 02/11/2012  . Diverticulosis 02/11/2012  . Degenerative arthritis of hip 02/11/2012  . Cervical disc disease 02/11/2012  . Cervical cancer ([place])   . Allergic rhinitis   . Chronic LBP   . Obesity   . Blood in stool 08/31/2010  . HEMORRHOIDS-INTERNAL 07/21/2010  . CONSTIPATION 07/19/2010  . ARTHRITIS, KNEE 02/10/2009  . VAGINITIS, ATROPHIC 11/11/2008  . EXOGENOUS OBESITY 07/29/2008  . GERD 08/10/2007  . OVERACTIVE BLADDER 08/10/2007    Past Surgical History:  Procedure Laterality Date  . BUNIONECTOMY  2007  . CERVICAL DISCECTOMY  2002  . CHOLECYSTECTOMY N/A 08/13/2013   Procedure: LAPAROSCOPIC CHOLECYSTECTOMY WITH INTRAOPERATIVE CHOLANGIOGRAM;  Surgeon: Shann Medal, MD;  Location: [facility];  Service: General;  Laterality: N/A;  . COLONOSCOPY    . conization of cervix  1982   with D&C  . CYSTOCELE REPAIR  2003   rectocele repair  . EYE SURGERY Bilateral    lasik  . ORIF ANKLE FRACTURE Right 08/20/2017   Procedure: OPEN REDUCTION INTERNAL FIXATION (ORIF)  ANKLE FRACTURE;  Surgeon: Meredith Pel, MD;  Location: [facility];  Service: Orthopedics;  Laterality: Right;  . SHOULDER ARTHROSCOPY  2004   frozen shoulder  . TONSILLECTOMY    . VAGINAL HYSTERECTOMY  1986     OB History    Gravida  1   Para  1   Term  1   Preterm      AB      Living  1     SAB      TAB      Ectopic      Multiple      Live Births               Home Medications    Prior to Admission medications   Medication Sig Start Date End Date Taking? Authorizing Provider  aspirin EC 81 MG tablet Take 81 mg by mouth daily.    [provider]    baclofen (LIORESAL) 10 MG tablet Take 1 tablet (10 mg total) by mouth 3 (three) times daily. 08/23/17   Meredith Pel, MD  HYDROcodone-acetaminophen (NORCO/VICODIN) 5-325 MG tablet Take 2 tablets by mouth every 4 (four) hours as needed. 06/03/18   Virgel Manifold, MD  methocarbamol (ROBAXIN) 500 MG tablet Take 1 tablet (500 mg total) by mouth 2 (two) times daily as needed for muscle spasms. 09/04/17   Meredith Pel, MD  MULTIPLE VITAMIN tablet Take 1 tablet by mouth daily.    [provider]  nabumetone (RELAFEN) 750 MG tablet TAKE 1 TABLET BY MOUTH TWICE A DAY AS NEEDED FOR ARTHRITIS 05/03/18   Biagio Borg, MD  pantoprazole (PROTONIX) 40 MG tablet Take 1 tablet (40 mg total) by mouth daily. 05/03/18   Biagio Borg, MD  predniSONE (DELTASONE) 10 MG tablet 3 tabs by mouth per day for 3 days,2tabs per day for 3 days,1tab per day for 3 days 05/03/18   Biagio Borg, MD  YUVAFEM 10 MCG TABS vaginal tablet PLACE 1 TABLET (10 MCG TOTAL) VAGINALLY 2 (TWO) TIMES A WEEK. 07/13/17   Regina Eck, CNM    Family History Family History  Problem Relation Age of Onset  . Breast cancer Mother        dx in her last 53's  . Heart disease Sister   . Breast cancer Sister        dx in her late 34's  . Diabetes Brother   . Heart disease Brother        triple bypass  . Diabetes Maternal Aunt   . Diabetes Maternal Uncle   . Cancer Maternal Grandmother        uterine  . Colon cancer Neg Hx   . Rectal cancer Neg Hx   . Esophageal cancer Neg Hx     Social History Social History   Tobacco Use  . Smoking status: Never Smoker  . Smokeless tobacco: Never Used  Substance Use Topics  . Alcohol use: No    Alcohol/week: 0.0 oz  . Drug use: No     Allergies   Amoxicillin and Penicillins   Review of Systems Review of Systems All systems reviewed and negative, other than as noted in HPI.   Physical Exam Updated Vital Signs BP (!) 141/86 (BP Location: Right Arm)   Pulse 86    Temp 98.3 F (36.8 C) (Oral)   Resp 20   Ht 5' 7" (1.702 m)   Wt 101.6 kg (224 lb)   SpO2 98%  BMI 35.08 kg/m   Physical Exam  Constitutional: She appears well-developed and well-nourished. No distress.  HENT:  Head: Normocephalic and atraumatic.  Eyes: Conjunctivae are normal. Right eye exhibits no discharge. Left eye exhibits no discharge.  Neck: Neck supple.  Cardiovascular: Normal rate, regular rhythm and normal heart sounds. Exam reveals no gallop and no friction rub.  No murmur heard. Pulmonary/Chest: Effort normal and breath sounds normal. No respiratory distress.  Abdominal: Soft. She exhibits no distension. There is no tenderness.  Musculoskeletal: She exhibits edema and tenderness.  Tenderness to palpation and mild swelling near the base of the fifth metatarsal.  Closed injury.  Palpable DP pulse.  Sensation intact.  Able to move toes.  Neurological: She is alert.  Skin: Skin is warm and dry.  Psychiatric: She has a normal mood and affect. Her behavior is normal. Thought content normal.  Nursing note and vitals reviewed.    ED Treatments / Results  Labs (all labs ordered are listed, but only abnormal results are displayed) Labs Reviewed - No data to display  EKG None  Radiology No results found.  Dg Foot Complete Right  Result Date: 06/03/2018 CLINICAL DATA:  Rolled right foot.  Pain and swelling EXAM: RIGHT FOOT COMPLETE - 3+ VIEW COMPARISON:  None. FINDINGS: There is a fracture at the base of the right 5th metatarsal, nondisplaced. Remote internal fixation in the distal tibia and fibula. Screw within the distal 1st metatarsal likely from prior osteotomy. IMPRESSION: Nondisplaced fracture the base of the right 5th metatarsal. Electronically Signed   By: Rolm Baptise M.D.   On: 06/03/2018 20:12    Procedures Procedures (including critical care time)  Medications Ordered in ED Medications  acetaminophen (TYLENOL) tablet 650 mg (650 mg Oral Given 06/03/18 2020)      Initial Impression / Assessment and Plan / ED Course  I have reviewed the triage vital signs and the nursing notes.  Pertinent labs & imaging results that were available during my care of the patient were reviewed by me and considered in my medical decision making (see chart for details).     Closed fifth metatarsal base fracture.  Closed.  Neurovascular intact.  Cam walker.  As needed pain medication.  Elevation.  Orthopedic follow-up.  Final Clinical Impressions(s) / ED Diagnoses   Final diagnoses:  Nondisplaced fracture of fifth metatarsal bone, right foot, initial encounter for closed fracture    ED Discharge Orders        Ordered    HYDROcodone-acetaminophen (NORCO/VICODIN) 5-325 MG tablet  Every 4 hours PRN     06/03/18 2027       Virgel Manifold, MD 06/10/18 1706

## 2018-06-12 ENCOUNTER — Encounter: Payer: Self-pay | Admitting: Obstetrics & Gynecology

## 2018-06-20 ENCOUNTER — Ambulatory Visit (INDEPENDENT_AMBULATORY_CARE_PROVIDER_SITE_OTHER): Payer: Federal, State, Local not specified - PPO | Admitting: Physician Assistant

## 2018-06-20 ENCOUNTER — Encounter (INDEPENDENT_AMBULATORY_CARE_PROVIDER_SITE_OTHER): Payer: Self-pay | Admitting: Physician Assistant

## 2018-06-20 ENCOUNTER — Ambulatory Visit (INDEPENDENT_AMBULATORY_CARE_PROVIDER_SITE_OTHER): Payer: Self-pay

## 2018-06-20 DIAGNOSIS — S92354D Nondisplaced fracture of fifth metatarsal bone, right foot, subsequent encounter for fracture with routine healing: Secondary | ICD-10-CM

## 2018-06-20 NOTE — Progress Notes (Signed)
HPI: [name] returns today 2 weeks status post right foot fifth metatarsal base fracture.  She is been a Banker.  She states she is overall doing well.  Is taking no medications for pain.  She only has pain whenever she inverts her reverse the foot to extremes.  Physical exam: Right foot dorsal pedal pulses intact.  She has minimal tenderness of the base the fifth metatarsal.  There is no rashes skin lesions ulcerations erythema or ecchymosis.  Right calf supple nontender.  She has good range of motion of the right ankle and well-healed surgical incision from a previous ankle fracture which required open reduction internal fixation.  Radiographs: 3 views right foot.  Early consolidation of the fifth metatarsal fracture.  Fracture remains in good position overall near-anatomic alignment.  No other fractures.  Plan: She will continue to use the cam walker boot weightbearing as tolerated the next 2 weeks and then slowly transition to a regular shoe as tolerated.  We will see her back in 1 month obtain 3 views of the right foot at that time.

## 2018-06-27 DIAGNOSIS — S8292XA Unspecified fracture of left lower leg, initial encounter for closed fracture: Secondary | ICD-10-CM | POA: Diagnosis not present

## 2018-06-27 DIAGNOSIS — G4733 Obstructive sleep apnea (adult) (pediatric): Secondary | ICD-10-CM | POA: Diagnosis not present

## 2018-07-04 DIAGNOSIS — G4733 Obstructive sleep apnea (adult) (pediatric): Secondary | ICD-10-CM | POA: Diagnosis not present

## 2018-07-06 ENCOUNTER — Encounter: Payer: Self-pay | Admitting: Certified Nurse Midwife

## 2018-07-06 ENCOUNTER — Other Ambulatory Visit: Payer: Self-pay

## 2018-07-06 ENCOUNTER — Other Ambulatory Visit (HOSPITAL_COMMUNITY)
Admission: RE | Admit: 2018-07-06 | Discharge: 2018-07-06 | Disposition: A | Discharge status: Home / Self Care | Payer: Federal, State, Local not specified - PPO | Source: Ambulatory Visit | Attending: Certified Nurse Midwife | Admitting: Certified Nurse Midwife

## 2018-07-06 ENCOUNTER — Ambulatory Visit: Payer: Federal, State, Local not specified - PPO | Admitting: Certified Nurse Midwife

## 2018-07-06 VITALS — BP 120/70 | HR 70 | Resp 16 | Ht 66.75 in | Wt 229.0 lb

## 2018-07-06 DIAGNOSIS — Z124 Encounter for screening for malignant neoplasm of cervix: Secondary | ICD-10-CM | POA: Insufficient documentation

## 2018-07-06 DIAGNOSIS — Z01411 Encounter for gynecological examination (general) (routine) with abnormal findings: Secondary | ICD-10-CM | POA: Diagnosis not present

## 2018-07-06 DIAGNOSIS — N390 Urinary tract infection, site not specified: Secondary | ICD-10-CM

## 2018-07-06 DIAGNOSIS — R319 Hematuria, unspecified: Secondary | ICD-10-CM | POA: Diagnosis not present

## 2018-07-06 DIAGNOSIS — Z0000 Encounter for general adult medical examination without abnormal findings: Secondary | ICD-10-CM

## 2018-07-06 DIAGNOSIS — N952 Postmenopausal atrophic vaginitis: Secondary | ICD-10-CM

## 2018-07-06 LAB — POCT URINALYSIS DIPSTICK
Bilirubin, UA: NEGATIVE
Blood, UA: NEGATIVE
Glucose, UA: NEGATIVE
Ketones, UA: NEGATIVE
Nitrite, UA: POSITIVE
Protein, UA: NEGATIVE
Urobilinogen, UA: NEGATIVE U/dL — AB
pH, UA: 5

## 2018-07-06 MED ORDER — ESTRADIOL 10 MCG VA TABS: 1.0000 | ORAL_TABLET | VAGINAL | 12 refills | Status: DC

## 2018-07-06 MED ORDER — NITROFURANTOIN MONOHYD MACRO 100 MG PO CAPS: ORAL_CAPSULE | ORAL | 0 refills | Status: DC

## 2018-07-06 NOTE — Patient Instructions (Signed)
EXERCISE AND DIET:  We recommended that you start or continue a regular exercise program for good health. Regular exercise means any activity that makes your heart beat faster and makes you sweat.  We recommend exercising at least 30 minutes per day at least 3 days a week, preferably 4 or 5.  We also recommend a diet low in fat and sugar.  Inactivity, poor dietary choices and obesity can cause diabetes, heart attack, stroke, and kidney damage, among others.    ALCOHOL AND SMOKING:  Women should limit their alcohol intake to no more than 7 drinks/beers/glasses of wine (combined, not each!) per week. Moderation of alcohol intake to this level decreases your risk of breast cancer and liver damage. And of course, no recreational drugs are part of a healthy lifestyle.  And absolutely no smoking or even second hand smoke. Most people know smoking can cause heart and lung diseases, but did you know it also contributes to weakening of your bones? Aging of your skin?  Yellowing of your teeth and nails?  CALCIUM AND VITAMIN D:  Adequate intake of calcium and Vitamin D are recommended.  The recommendations for exact amounts of these supplements seem to change often, but generally speaking 600 mg of calcium (either carbonate or citrate) and 800 units of Vitamin D per day seems prudent. Certain women may benefit from higher intake of Vitamin D.  If you are among these women, your doctor will have told you during your visit.    PAP SMEARS:  Pap smears, to check for cervical cancer or precancers,  have traditionally been done yearly, although recent scientific advances have shown that most women can have pap smears less often.  However, every woman still should have a physical exam from her gynecologist every year. It will include a breast check, inspection of the vulva and vagina to check for abnormal growths or skin changes, a visual exam of the cervix, and then an exam to evaluate the size and shape of the uterus and  ovaries.  And after 65 years of age, a rectal exam is indicated to check for rectal cancers. We will also provide age appropriate advice regarding health maintenance, like when you should have certain vaccines, screening for sexually transmitted diseases, bone density testing, colonoscopy, mammograms, etc.   MAMMOGRAMS:  All women over 40 years old should have a yearly mammogram. Many facilities now offer a "3D" mammogram, which may cost around $50 extra out of pocket. If possible,  we recommend you accept the option to have the 3D mammogram performed.  It both reduces the number of women who will be called back for extra views which then turn out to be normal, and it is better than the routine mammogram at detecting truly abnormal areas.    COLONOSCOPY:  Colonoscopy to screen for colon cancer is recommended for all women at age 50.  We know, you hate the idea of the prep.  We agree, BUT, having colon cancer and not knowing it is worse!!  Colon cancer so often starts as a polyp that can be seen and removed at colonscopy, which can quite literally save your life!  And if your first colonoscopy is normal and you have no family history of colon cancer, most women don't have to have it again for 10 years.  Once every ten years, you can do something that may end up saving your life, right?  We will be happy to help you get it scheduled when you are ready.    Be sure to check your insurance coverage so you understand how much it will cost.  It may be covered as a preventative service at no cost, but you should check your particular policy.      Urinary Tract Infection, Adult A urinary tract infection (UTI) is an infection of any part of the urinary tract. The urinary tract includes the:  Kidneys.  Ureters.  Bladder.  Urethra.  These organs make, store, and get rid of pee (urine) in the body. Follow these instructions at home:  Take over-the-counter and prescription medicines only as told by your  doctor.  If you were prescribed an antibiotic medicine, take it as told by your doctor. Do not stop taking the antibiotic even if you start to feel better.  Avoid the following drinks: ? Alcohol. ? Caffeine. ? Tea. ? Carbonated drinks.  Drink enough fluid to keep your pee clear or pale yellow.  Keep all follow-up visits as told by your doctor. This is important.  Make sure to: ? Empty your bladder often and completely. Do not to hold pee for long periods of time. ? Empty your bladder before and after sex. ? Wipe from front to back after a bowel movement if you are female. Use each tissue one time when you wipe. Contact a doctor if:  You have back pain.  You have a fever.  You feel sick to your stomach (nauseous).  You throw up (vomit).  Your symptoms do not get better after 3 days.  Your symptoms go away and then come back. Get help right away if:  You have very bad back pain.  You have very bad lower belly (abdominal) pain.  You are throwing up and cannot keep down any medicines or water. This information is not intended to replace advice given to you by your health care provider. Make sure you discuss any questions you have with your health care provider. Document Released: 04/11/2008 Document Revised: 03/31/2016 Document Reviewed: 09/14/2015 Elsevier Interactive Patient Education  [employer].

## 2018-07-06 NOTE — Progress Notes (Signed)
67 y.o. [id number] Married  Caucasian Fe here for annual exam. Menopausal no HRT, no vaginal bleeding, and continues to use Yuvafem twice weekly with good results. Occasional dryness only. Had fractured  Right ankle with surgery, all normal now. Continues to have rectal bleeding off and on and plans hemorrhoid surgery for. Patient complaining of urinary frequency, urgency, and urine odor. Denies fever, chills or backache. History of UTI, has been out of Macrobid to use after sexual activity. Sees PCP for aex and GERD management.. No other health issues today.  No LMP recorded. Patient has had a hysterectomy.          Sexually active: Yes.    The current method of family planning is status post hysterectomy.    Exercising: No.  exercise Smoker:  no  Review of Systems  Constitutional: Negative.   HENT: Negative.   Eyes: Negative.   Respiratory: Negative.   Cardiovascular: Negative.   Gastrointestinal: Negative.   Genitourinary: Positive for dysuria, frequency and urgency.  Musculoskeletal: Negative.   Skin: Negative.   Neurological: Negative.   Endo/Heme/Allergies: Negative.   Psychiatric/Behavioral: Negative.     Health Maintenance: Pap:  12-29-15 LGSIL, 06-21-16 neg, 07-05-17 neg History of Abnormal Pap: yes MMG:  05-16-18 category b density birads 2:neg Self Breast exams: occ Colonoscopy:  2018 f/u 27yr BMD:   2019 TDaP:  2015 Shingles: not done Pneumonia: had done Hep C and HIV: hep c neg 2017 Labs: wbc tr, nitirite +, fishy urine odor   reports that she has never smoked. She has never used smokeless tobacco. She reports that she does not drink alcohol or use drugs.  Past Medical History:  Diagnosis Date  . Abnormal Pap smear of vagina 06/17/15   LGSIL  . Allergic rhinitis, cause unspecified   . Arthritis   . ARTHRITIS, KNEE 02/10/2009   Qualifier: Diagnosis of  By: SNiel HummerMD, WLorinda Creed  . BRCA negative 03/25/13  . Cervical cancer ([place] 1984  . Cervical disc disease  02/11/2012  . Chronic LBP   . Degenerative arthritis of hip 02/11/2012  . Diverticulosis 02/11/2012  . GERD 08/10/2007   Qualifier: Diagnosis of  By: WSherlynn Stalls CMA, [place]   . GERD (gastroesophageal reflux disease)   . GERD (gastroesophageal reflux disease)   . HEMORRHOIDS-INTERNAL 07/21/2010   Qualifier: Diagnosis of  By: GChester HolsteinNP, PNevin Bloodgood   . Hiatal hernia   . Hyperlipidemia 02/13/2012  . Mitral valve prolapse   . OAB (overactive bladder)   . Obesity   . OVERACTIVE BLADDER 08/10/2007   Qualifier: Diagnosis of  By: WSherlynn Stalls CMA, [place]   . Pneumonia   . PONV (postoperative nausea and vomiting)   . Recurrent UTI   . SUI (stress urinary incontinence, female)   . VAGINITIS, ATROPHIC 11/11/2008   Qualifier: Diagnosis of  By: SNiel HummerMD, WLorinda Creed    Past Surgical History:  Procedure Laterality Date  . BUNIONECTOMY  2007  . CERVICAL DISCECTOMY  2002  . CHOLECYSTECTOMY N/A 08/13/2013   Procedure: LAPAROSCOPIC CHOLECYSTECTOMY WITH INTRAOPERATIVE CHOLANGIOGRAM;  Surgeon: DShann Medal MD;  [facility]  Service: General;  Laterality: N/A;  . COLONOSCOPY    . conization of cervix  1982   with D&C  . CYSTOCELE REPAIR  2003   rectocele repair  . EYE SURGERY Bilateral    lasik  . ORIF ANKLE FRACTURE Right 08/20/2017   Procedure: OPEN REDUCTION INTERNAL FIXATION (ORIF) ANKLE FRACTURE;  Surgeon: DMeredith Pel  MD;  Location[facility];  Service: Orthopedics;  Laterality: Right;  . SHOULDER ARTHROSCOPY  2004   frozen shoulder  . TONSILLECTOMY    . VAGINAL HYSTERECTOMY  1986    Current Outpatient Medications  Medication Sig Dispense Refill  . aspirin EC 81 MG tablet Take 81 mg by mouth daily.    . MULTIPLE VITAMIN tablet Take 1 tablet by mouth daily.    . nabumetone (RELAFEN) 750 MG tablet TAKE 1 TABLET BY MOUTH TWICE A DAY AS NEEDED FOR ARTHRITIS 180 tablet 3  . pantoprazole (PROTONIX) 40 MG tablet Take 1 tablet (40 mg total) by mouth daily. 90 tablet 3  . YUVAFEM 10 MCG TABS  vaginal tablet PLACE 1 TABLET (10 MCG TOTAL) VAGINALLY 2 (TWO) TIMES A WEEK. 8 tablet 12   No current facility-administered medications for this visit.     Family History  Problem Relation Age of Onset  . Breast cancer Mother        dx in her last 95's  . Heart disease Sister   . Breast cancer Sister        dx in her late 59's  . Diabetes Brother   . Heart disease Brother        triple bypass  . Diabetes Maternal Aunt   . Diabetes Maternal Uncle   . Cancer Maternal Grandmother        uterine  . Colon cancer Neg Hx   . Rectal cancer Neg Hx   . Esophageal cancer Neg Hx     ROS:  Pertinent items are noted in HPI.  Otherwise, a comprehensive ROS was negative.  Exam:   BP 120/70   Pulse 70   Resp 16   Ht 5' 6.75" (1.695 m)   Wt 229 lb (103.9 kg)   BMI 36.14 kg/m  Height: 5' 6.75" (169.5 cm) Ht Readings from Last 3 Encounters:  07/06/18 5' 6.75" (1.695 m)  06/03/18 '5\' 7"'  (1.702 m)  05/03/18 5' 7.5" (1.715 m)    General appearance: alert, cooperative and appears stated age Head: Normocephalic, without obvious abnormality, atraumatic Neck: no adenopathy, supple, symmetrical, trachea midline and thyroid normal to inspection and palpation Lungs: clear to auscultation bilaterally CVAT bilateral negative Breasts: normal appearance, no masses or tenderness, No nipple retraction or dimpling, No nipple discharge or bleeding, No axillary or supraclavicular adenopathy Heart: regular rate and rhythm Abdomen: soft, suprapubic tenderness; no masses,  no organomegaly Extremities: extremities normal, atraumatic, no cyanosis or edema Skin: Skin color, texture, turgor normal. No rashes or lesions, warm and dry Lymph nodes: Cervical, supraclavicular, and axillary nodes normal. No abnormal inguinal nodes palpated Neurologic: Grossenly normal   Pelvic: External genitalia:  no lesions              Urethra:  normal appearing urethra with no masses, tenderness noted, bladder tender,  urethral meatus tender, slightly red.               Bartholin's and Skene's: normal                 Vagina: normal appearing vagina with normal color and discharge, no lesions              Cervix: absent              Pap taken: Yes.   Bimanual Exam:  Uterus:  uterus absent              Adnexa: normal adnexa and no mass, fullness, tenderness  Rectovaginal: Confirms               Anus:  normal sphincter tone, hemorrhoids noted, none thrombosed  Chaperone present: yes  A:  Well Woman with normal exam  Menopausal no HRT  Atrophic vaginitis with Yuvafem use with good response  History of vaginal LSIL, repeat pap today  UTI  Recent fractured right ankle with surgical repair, out of follow now  P:   Reviewed health and wellness pertinent to exam  Discussed risks/benefits/warning signs of Yuvafem use, desires continuance  Rx Yuvafem see order with instructions  Discussed findings of UTI and need for treatment, also discussed applying small amount of moisturizer around urinary meatus can help with prevention.  Rx Macrobid see order with instructions  Lab: Urine culture  Pap smear: yes   counseled on breast self exam, mammography screening, feminine hygiene, adequate intake of calcium and vitamin D, diet and exercise, Kegel's exercises  return annually or prn  An After Visit Summary was printed and given to the patient.

## 2018-07-08 LAB — URINE CULTURE

## 2018-07-10 LAB — CYTOLOGY - PAP
Diagnosis: NEGATIVE
HPV: NOT DETECTED

## 2018-07-19 ENCOUNTER — Encounter (INDEPENDENT_AMBULATORY_CARE_PROVIDER_SITE_OTHER): Payer: Self-pay | Admitting: Physician Assistant

## 2018-07-19 ENCOUNTER — Ambulatory Visit (INDEPENDENT_AMBULATORY_CARE_PROVIDER_SITE_OTHER): Payer: Federal, State, Local not specified - PPO

## 2018-07-19 ENCOUNTER — Ambulatory Visit (INDEPENDENT_AMBULATORY_CARE_PROVIDER_SITE_OTHER): Payer: Federal, State, Local not specified - PPO | Admitting: Physician Assistant

## 2018-07-19 DIAGNOSIS — S92354D Nondisplaced fracture of fifth metatarsal bone, right foot, subsequent encounter for fracture with routine healing: Secondary | ICD-10-CM | POA: Diagnosis not present

## 2018-07-19 NOTE — Progress Notes (Signed)
Office Visit Note   Patient: [name]           Date of Birth: December 09, 1950           MRN: [id number] Visit Date: 07/19/2018              Requested by: Biagio Borg, MD [address], [place] [address] PCP: Biagio Borg, MD   Assessment & Plan: Visit Diagnoses:  1. Closed nondisplaced fracture of fifth metatarsal bone of right foot with routine healing, subsequent encounter     Plan: She is to refrain from high-impact activities for the next month and a half and then return to activities as tolerated.  She is currently in a regular shoe.  She will follow-up on as-needed basis concerns or questions.  Follow-Up Instructions: Return if symptoms worsen or fail to improve.   Orders:  Orders Placed This Encounter  Procedures  . XR Foot Complete Right   No orders of the defined types were placed in this encounter.     Procedures: No procedures performed   Clinical Data: No additional findings.   Subjective: Chief Complaint  Patient presents with  . Right Foot - Follow-up    HPI [name] returns today just over 6 weeks status post right fifth metatarsal base fracture.  States overall she is doing well.  No concerns back in a regular shoe.  She does have some soreness in the foot. Review of Systems See HPI otherwise negative  Objective: Vital Signs: There were no vitals taken for this visit.  Physical Exam  Constitutional: She appears well-developed and well-nourished. No distress.  Skin: She is not diaphoretic.    Ortho Exam Right foot: Dorsal pedal pulses intact.  Calf soft nontender.  She has good range of motion foot with inversion eversion without pain.  Minimal tenderness over the base of the fifth metatarsal.  No rashes skin is ulcerations. Specialty Comments:  No specialty comments available.  Imaging: Xr Foot Complete Right  Result Date: 07/19/2018 Right foot 3 views: Interval healing is present.  Fracture site at the base the fifth  metatarsal is barely evident.  No other fractures identified.  Remains in overall good position alignment.    PMFS History: Patient Active Problem List   Diagnosis Date Noted  . Cough 05/03/2018  . Dysuria 03/28/2018  . Acute sinus infection 03/28/2018  . Trimalleolar fracture 08/20/2017  . Ankle fracture 08/20/2017  . Sleep-disordered breathing 07/30/2017  . Right hip pain 07/28/2017  . Abnormal TSH 07/21/2015  . URI (upper respiratory infection) 04/17/2015  . Greater trochanteric bursitis of right hip 08/04/2014  . Localized osteoarthrosis, lower leg 08/04/2014  . Biliary colic 01/77/9390  . Cholelithiasis 08/07/2013  . Left otitis media 12/07/2012  . Hyperlipidemia 02/13/2012  . Impaired glucose tolerance 02/13/2012  . Preventative health care 02/11/2012  . Diverticulosis 02/11/2012  . Degenerative arthritis of hip 02/11/2012  . Cervical disc disease 02/11/2012  . Cervical cancer ([place])   . Allergic rhinitis   . Chronic LBP   . Obesity   . Blood in stool 08/31/2010  . HEMORRHOIDS-INTERNAL 07/21/2010  . CONSTIPATION 07/19/2010  . ARTHRITIS, KNEE 02/10/2009  . VAGINITIS, ATROPHIC 11/11/2008  . EXOGENOUS OBESITY 07/29/2008  . GERD 08/10/2007  . OVERACTIVE BLADDER 08/10/2007   Past Medical History:  Diagnosis Date  . Abnormal Pap smear of vagina 06/17/15   LGSIL  . Allergic rhinitis, cause unspecified   . Arthritis   . ARTHRITIS,  KNEE 02/10/2009   Qualifier: Diagnosis of  By: Niel Hummer MD, [place] BRCA negative 03/25/13  . Cervical cancer ([place]) 1984  . Cervical disc disease 02/11/2012  . Chronic LBP   . Degenerative arthritis of hip 02/11/2012  . Diverticulosis 02/11/2012  . GERD 08/10/2007   Qualifier: Diagnosis of  By: Sherlynn Stalls, CMA, [place]    . GERD (gastroesophageal reflux disease)   . GERD (gastroesophageal reflux disease)   . HEMORRHOIDS-INTERNAL 07/21/2010   Qualifier: Diagnosis of  By: Chester Holstein NP, Nevin Bloodgood    . Hiatal hernia   . Hyperlipidemia 02/13/2012  .  Mitral valve prolapse   . OAB (overactive bladder)   . Obesity   . OVERACTIVE BLADDER 08/10/2007   Qualifier: Diagnosis of  By: Sherlynn Stalls, CMA, [place]    . Pneumonia   . PONV (postoperative nausea and vomiting)   . Recurrent UTI   . SUI (stress urinary incontinence, female)   . VAGINITIS, ATROPHIC 11/11/2008   Qualifier: Diagnosis of  By: Niel Hummer MD, Lorinda Creed     Family History  Problem Relation Age of Onset  . Breast cancer Mother        dx in her last 68's  . Heart disease Sister   . Breast cancer Sister        dx in her late 53's  . Diabetes Brother   . Heart disease Brother        triple bypass  . Diabetes Maternal Aunt   . Diabetes Maternal Uncle   . Cancer Maternal Grandmother        uterine  . Colon cancer Neg Hx   . Rectal cancer Neg Hx   . Esophageal cancer Neg Hx     Past Surgical History:  Procedure Laterality Date  . BUNIONECTOMY  2007  . CERVICAL DISCECTOMY  2002  . CHOLECYSTECTOMY N/A 08/13/2013   Procedure: LAPAROSCOPIC CHOLECYSTECTOMY WITH INTRAOPERATIVE CHOLANGIOGRAM;  Surgeon: Shann Medal, MD;  Location: [facility];  Service: General;  Laterality: N/A;  . CLOSED REDUCTION METATARSAL FRACTURE     rt side 06-03-18, left side 08-19-17  . COLONOSCOPY    . conization of cervix  1982   with D&C  . CYSTOCELE REPAIR  2003   rectocele repair  . EYE SURGERY Bilateral    lasik  . ORIF ANKLE FRACTURE Right 08/20/2017   Procedure: OPEN REDUCTION INTERNAL FIXATION (ORIF) ANKLE FRACTURE;  Surgeon: Meredith Pel, MD;  Location: [facility];  Service: Orthopedics;  Laterality: Right;  . SHOULDER ARTHROSCOPY  2004   frozen shoulder  . TONSILLECTOMY    . VAGINAL HYSTERECTOMY  1986   Social History   Occupational History    Employer: [employer]  Tobacco Use  . Smoking status: Never Smoker  . Smokeless tobacco: Never Used  Substance and Sexual Activity  . Alcohol use: No    Alcohol/week: 0.0 standard drinks  . Drug use: No  . Sexual activity: Yes    Partners:  Male    Birth control/protection: Post-menopausal    Comment: hysterectomy

## 2018-07-25 ENCOUNTER — Other Ambulatory Visit (INDEPENDENT_AMBULATORY_CARE_PROVIDER_SITE_OTHER): Payer: Federal, State, Local not specified - PPO

## 2018-07-25 DIAGNOSIS — R7302 Impaired glucose tolerance (oral): Secondary | ICD-10-CM

## 2018-07-25 DIAGNOSIS — Z0000 Encounter for general adult medical examination without abnormal findings: Secondary | ICD-10-CM

## 2018-07-25 LAB — CBC WITH DIFFERENTIAL/PLATELET
BASOS ABS: 0.1 10*3/uL (ref 0.0–0.1)
Basophils Relative: 0.8 % (ref 0.0–3.0)
Eosinophils Absolute: 0.3 10*3/uL (ref 0.0–0.7)
Eosinophils Relative: 4.3 % (ref 0.0–5.0)
HCT: 39.3 % (ref 36.0–46.0)
Hemoglobin: 13.4 g/dL (ref 12.0–15.0)
LYMPHS ABS: 3 10*3/uL (ref 0.7–4.0)
Lymphocytes Relative: 44.2 % (ref 12.0–46.0)
MCHC: 34 g/dL (ref 30.0–36.0)
MCV: 86.9 fl (ref 78.0–100.0)
MONO ABS: 0.4 10*3/uL (ref 0.1–1.0)
MONOS PCT: 6.3 % (ref 3.0–12.0)
NEUTROS ABS: 3 10*3/uL (ref 1.4–7.7)
NEUTROS PCT: 44.4 % (ref 43.0–77.0)
PLATELETS: 253 10*3/uL (ref 150.0–400.0)
RBC: 4.52 Mil/uL (ref 3.87–5.11)
RDW: 13.4 % (ref 11.5–15.5)
WBC: 6.7 10*3/uL (ref 4.0–10.5)

## 2018-07-25 LAB — HEPATIC FUNCTION PANEL
ALBUMIN: 3.9 g/dL (ref 3.5–5.2)
ALT: 12 U/L (ref 0–35)
AST: 15 U/L (ref 0–37)
Alkaline Phosphatase: 96 U/L (ref 39–117)
BILIRUBIN TOTAL: 0.8 mg/dL (ref 0.2–1.2)
Bilirubin, Direct: 0.1 mg/dL (ref 0.0–0.3)
Total Protein: 7 g/dL (ref 6.0–8.3)

## 2018-07-25 LAB — LIPID PANEL
CHOL/HDL RATIO: 4
Cholesterol: 160 mg/dL (ref 0–200)
HDL: 43.2 mg/dL (ref >39.00)
LDL CALC: 100 mg/dL — AB (ref 0–99)
NonHDL: 116.44
TRIGLYCERIDES: 81 mg/dL (ref 0.0–149.0)
VLDL: 16.2 mg/dL (ref 0.0–40.0)

## 2018-07-25 LAB — BASIC METABOLIC PANEL
BUN: 15 mg/dL (ref 6–23)
CHLORIDE: 107 meq/L (ref 96–112)
CO2: 28 meq/L (ref 19–32)
Calcium: 9.4 mg/dL (ref 8.4–10.5)
Creatinine, Ser: 0.83 mg/dL (ref 0.40–1.20)
GFR: 73.38 mL/min (ref >60.00)
GLUCOSE: 88 mg/dL (ref 70–99)
Potassium: 4.1 mEq/L (ref 3.5–5.1)
SODIUM: 141 meq/L (ref 135–145)

## 2018-07-25 LAB — URINALYSIS, ROUTINE W REFLEX MICROSCOPIC
Bilirubin Urine: NEGATIVE
Hgb urine dipstick: NEGATIVE
Ketones, ur: NEGATIVE
Leukocytes, UA: NEGATIVE
Nitrite: NEGATIVE
RBC / HPF: NONE SEEN (ref 0–?)
Total Protein, Urine: NEGATIVE
Urine Glucose: NEGATIVE
Urobilinogen, UA: 0.2 (ref 0.0–1.0)
pH: 6.5 (ref 5.0–8.0)

## 2018-07-25 LAB — TSH: TSH: 4.25 u[IU]/mL (ref 0.35–4.50)

## 2018-07-25 LAB — HEMOGLOBIN A1C: Hgb A1c MFr Bld: 6 % (ref 4.6–6.5)

## 2018-07-30 ENCOUNTER — Telehealth (INDEPENDENT_AMBULATORY_CARE_PROVIDER_SITE_OTHER): Payer: Self-pay | Admitting: Radiology

## 2018-07-30 NOTE — Telephone Encounter (Signed)
Error----[facility] will contact her pulmonologist

## 2018-07-31 ENCOUNTER — Other Ambulatory Visit: Payer: Federal, State, Local not specified - PPO

## 2018-07-31 ENCOUNTER — Ambulatory Visit (INDEPENDENT_AMBULATORY_CARE_PROVIDER_SITE_OTHER): Payer: Federal, State, Local not specified - PPO | Admitting: Internal Medicine

## 2018-07-31 ENCOUNTER — Encounter: Payer: Self-pay | Admitting: Internal Medicine

## 2018-07-31 VITALS — BP 116/74 | HR 64 | Ht 66.75 in | Wt 231.0 lb

## 2018-07-31 DIAGNOSIS — Z23 Encounter for immunization: Secondary | ICD-10-CM | POA: Diagnosis not present

## 2018-07-31 DIAGNOSIS — Z114 Encounter for screening for human immunodeficiency virus [HIV]: Secondary | ICD-10-CM

## 2018-07-31 DIAGNOSIS — R7302 Impaired glucose tolerance (oral): Secondary | ICD-10-CM | POA: Diagnosis not present

## 2018-07-31 DIAGNOSIS — N390 Urinary tract infection, site not specified: Secondary | ICD-10-CM | POA: Diagnosis not present

## 2018-07-31 DIAGNOSIS — R319 Hematuria, unspecified: Secondary | ICD-10-CM

## 2018-07-31 DIAGNOSIS — Z0000 Encounter for general adult medical examination without abnormal findings: Secondary | ICD-10-CM

## 2018-07-31 MED ORDER — PANTOPRAZOLE SODIUM 40 MG PO TBEC: 40.0000 mg | DELAYED_RELEASE_TABLET | Freq: Every day | ORAL | 3 refills | Status: DC

## 2018-07-31 MED ORDER — NITROFURANTOIN MONOHYD MACRO 100 MG PO CAPS: ORAL_CAPSULE | ORAL | 1 refills | Status: DC

## 2018-07-31 NOTE — Assessment & Plan Note (Signed)

## 2018-07-31 NOTE — Progress Notes (Signed)
Subjective:    Patient ID: [name], female    DOB: Mar 26, 1951, 67 y.o.   MRN: [id number]  HPI  Here for wellness and f/u;  Overall doing ok;  Pt denies Chest pain, worsening SOB, DOE, wheezing, orthopnea, PND, worsening LE edema, palpitations, dizziness or syncope.  Pt denies neurological change such as new headache, facial or extremity weakness.  Pt denies polydipsia, polyuria, or low sugar symptoms. Pt states overall good compliance with treatment and medications, good tolerability, and has been trying to follow appropriate diet.  Pt denies worsening depressive symptoms, suicidal ideation or panic. No fever, night sweats, wt loss, loss of appetite, or other constitutional symptoms.  Pt states good ability with ADL's, has low fall risk, home safety reviewed and adequate, no other significant changes in hearing or vision, and only occasionally active with exercise. No new complaints  Plans to be more active when retire end of 2019.   Wt Readings from Last 3 Encounters:  07/31/18 231 lb (104.8 kg)  07/06/18 229 lb (103.9 kg)  06/03/18 224 lb (101.6 kg)   BP Readings from Last 3 Encounters:  07/31/18 116/74  07/06/18 120/70  06/03/18 (!) 141/86   Past Medical History:  Diagnosis Date  . Abnormal Pap smear of vagina 06/17/15   LGSIL  . Allergic rhinitis, cause unspecified   . Arthritis   . ARTHRITIS, KNEE 02/10/2009   Qualifier: Diagnosis of  By: Niel Hummer MD, Lorinda Creed   . BRCA negative 03/25/13  . Cervical cancer ([place]) 1984  . Cervical disc disease 02/11/2012  . Chronic LBP   . Degenerative arthritis of hip 02/11/2012  . Diverticulosis 02/11/2012  . GERD 08/10/2007   Qualifier: Diagnosis of  By: Sherlynn Stalls, CMA, [place]    . GERD (gastroesophageal reflux disease)   . GERD (gastroesophageal reflux disease)   . HEMORRHOIDS-INTERNAL 07/21/2010   Qualifier: Diagnosis of  By: Chester Holstein NP, Nevin Bloodgood    . Hiatal hernia   . Hyperlipidemia 02/13/2012  . Mitral valve prolapse   . OAB (overactive bladder)     . Obesity   . OVERACTIVE BLADDER 08/10/2007   Qualifier: Diagnosis of  By: Sherlynn Stalls, CMA, [place]    . Pneumonia   . PONV (postoperative nausea and vomiting)   . Recurrent UTI   . SUI (stress urinary incontinence, female)   . VAGINITIS, ATROPHIC 11/11/2008   Qualifier: Diagnosis of  By: Niel Hummer MD, Lorinda Creed    Past Surgical History:  Procedure Laterality Date  . BUNIONECTOMY  2007  . CERVICAL DISCECTOMY  2002  . CHOLECYSTECTOMY N/A 08/13/2013   Procedure: LAPAROSCOPIC CHOLECYSTECTOMY WITH INTRAOPERATIVE CHOLANGIOGRAM;  Surgeon: Shann Medal, MD;  Location: [facility];  Service: General;  Laterality: N/A;  . CLOSED REDUCTION METATARSAL FRACTURE     rt side 06-03-18, left side 08-19-17  . COLONOSCOPY    . conization of cervix  1982   with D&C  . CYSTOCELE REPAIR  2003   rectocele repair  . EYE SURGERY Bilateral    lasik  . ORIF ANKLE FRACTURE Right 08/20/2017   Procedure: OPEN REDUCTION INTERNAL FIXATION (ORIF) ANKLE FRACTURE;  Surgeon: Meredith Pel, MD;  Location[facility];  Service: Orthopedics;  Laterality: Right;  . SHOULDER ARTHROSCOPY  2004   frozen shoulder  . TONSILLECTOMY    . VAGINAL HYSTERECTOMY  1986    reports that she has never smoked. She has never used smokeless tobacco. She reports that she does not drink alcohol or use drugs. family history  includes Breast cancer in her mother and sister; Cancer in her maternal grandmother; Diabetes in her brother, maternal aunt, and maternal uncle; Heart disease in her brother and sister. Allergies  Allergen Reactions  . Amoxicillin     REACTION: unspecified  . Penicillins Other (See Comments)    Unknown reaction  ' Current Outpatient Medications on File Prior to Visit  Medication Sig Dispense Refill  . aspirin EC 81 MG tablet Take 81 mg by mouth daily.    . Estradiol (YUVAFEM) 10 MCG TABS vaginal tablet Place 1 tablet (10 mcg total) vaginally 2 (two) times a week. 8 tablet 12  . MULTIPLE VITAMIN tablet Take 1 tablet by  mouth daily.    . nabumetone (RELAFEN) 750 MG tablet TAKE 1 TABLET BY MOUTH TWICE A DAY AS NEEDED FOR ARTHRITIS 180 tablet 3   No current facility-administered medications on file prior to visit.    Review of Systems Constitutional: Negative for other unusual diaphoresis, sweats, appetite or weight changes HENT: Negative for other worsening hearing loss, ear pain, facial swelling, mouth sores or neck stiffness.   Eyes: Negative for other worsening pain, redness or other visual disturbance.  Respiratory: Negative for other stridor or swelling Cardiovascular: Negative for other palpitations or other chest pain  Gastrointestinal: Negative for worsening diarrhea or loose stools, blood in stool, distention or other pain Genitourinary: Negative for hematuria, flank pain or other change in urine volume.  Musculoskeletal: Negative for myalgias or other joint swelling.  Skin: Negative for other color change, or other wound or worsening drainage.  Neurological: Negative for other syncope or numbness. Hematological: Negative for other adenopathy or swelling Psychiatric/Behavioral: Negative for hallucinations, other worsening agitation, SI, self-injury, or new decreased concentration All other system neg per pt    Objective:   Physical Exam BP 116/74 (BP Location: Left Arm, Patient Position: Sitting, Cuff Size: Large)   Pulse 64   Ht 5' 6.75" (1.695 m)   Wt 231 lb (104.8 kg)   SpO2 94%   BMI 36.45 kg/m  VS noted,  Constitutional: Pt is oriented to person, place, and time. Appears well-developed and well-nourished, in no significant distress and comfortable Head: Normocephalic and atraumatic  Eyes: Conjunctivae and EOM are normal. Pupils are equal, round, and reactive to light Right Ear: External ear normal without discharge Left Ear: External ear normal without discharge Nose: Nose without discharge or deformity Mouth/Throat: Oropharynx is without other ulcerations and moist  Neck: Normal  range of motion. Neck supple. No JVD present. No tracheal deviation present or significant neck LA or mass Cardiovascular: Normal rate, regular rhythm, normal heart sounds and intact distal pulses.   Pulmonary/Chest: WOB normal and breath sounds without rales or wheezing  Abdominal: Soft. Bowel sounds are normal. NT. No HSM  Musculoskeletal: Normal range of motion. Exhibits no edema Lymphadenopathy: Has no other cervical adenopathy.  Neurological: Pt is alert and oriented to person, place, and time. Pt has normal reflexes. No cranial nerve deficit. Motor grossly intact, Gait intact Skin: Skin is warm and dry. No rash noted or new ulcerations Psychiatric:  Has normal mood and affect. Behavior is normal without agitation_0 No other exam findings  ECG today I have personally interpreted: NSR - 60    Assessment & Plan:

## 2018-07-31 NOTE — Patient Instructions (Addendum)
You had the flu shot today  Your EKG was OK today  Please continue all other medications as before, and refills have been done if requested.  Please have the pharmacy call with any other refills you may need.  Please continue your efforts at being more active, low cholesterol diet, and weight control.  You are otherwise up to date with prevention measures today.  Please keep your appointments with your specialists as you may have planned  Please return in 1 year for your yearly visit, or sooner if needed

## 2018-07-31 NOTE — Assessment & Plan Note (Signed)
stable overall by history and exam, recent data reviewed with pt, and pt to continue medical treatment as before,  to f/u any worsening symptoms or concerns  

## 2018-08-01 LAB — HIV ANTIBODY (ROUTINE TESTING W REFLEX): HIV 1&2 Ab, 4th Generation: NONREACTIVE

## 2018-08-02 LAB — HIV ANTIBODY (ROUTINE TESTING W REFLEX)

## 2018-09-26 ENCOUNTER — Encounter: Payer: Self-pay | Admitting: Family

## 2018-09-26 ENCOUNTER — Ambulatory Visit: Payer: Federal, State, Local not specified - PPO | Admitting: Family

## 2018-09-26 VITALS — BP 126/82 | HR 70 | Temp 98.0°F | Ht 66.75 in | Wt 231.0 lb

## 2018-09-26 DIAGNOSIS — J019 Acute sinusitis, unspecified: Secondary | ICD-10-CM

## 2018-09-26 MED ORDER — BENZONATATE 100 MG PO CAPS: 100.0000 mg | ORAL_CAPSULE | Freq: Three times a day (TID) | ORAL | 0 refills | Status: DC | PRN

## 2018-09-26 MED ORDER — DOXYCYCLINE HYCLATE 100 MG PO TABS: 100.0000 mg | ORAL_TABLET | Freq: Two times a day (BID) | ORAL | 0 refills | Status: DC

## 2018-09-26 NOTE — Progress Notes (Signed)
[name] is a 67 y.o. female with the following history as recorded in EpicCare:  Patient Active Problem List   Diagnosis Date Noted  . Cough 05/03/2018  . Dysuria 03/28/2018  . Trimalleolar fracture 08/20/2017  . Ankle fracture 08/20/2017  . Sleep-disordered breathing 07/30/2017  . Right hip pain 07/28/2017  . Abnormal TSH 07/21/2015  . URI (upper respiratory infection) 04/17/2015  . Greater trochanteric bursitis of right hip 08/04/2014  . Localized osteoarthrosis, lower leg 08/04/2014  . Biliary colic 10/62/6948  . Cholelithiasis 08/07/2013  . Left otitis media 12/07/2012  . Hyperlipidemia 02/13/2012  . Impaired glucose tolerance 02/13/2012  . Preventative health care 02/11/2012  . Diverticulosis 02/11/2012  . Degenerative arthritis of hip 02/11/2012  . Cervical disc disease 02/11/2012  . Cervical cancer ([place])   . Allergic rhinitis   . Chronic LBP   . Obesity   . Blood in stool 08/31/2010  . HEMORRHOIDS-INTERNAL 07/21/2010  . CONSTIPATION 07/19/2010  . ARTHRITIS, KNEE 02/10/2009  . VAGINITIS, ATROPHIC 11/11/2008  . EXOGENOUS OBESITY 07/29/2008  . GERD 08/10/2007  . OVERACTIVE BLADDER 08/10/2007    Current Outpatient Medications  Medication Sig Dispense Refill  . Estradiol (YUVAFEM) 10 MCG TABS vaginal tablet Place 1 tablet (10 mcg total) vaginally 2 (two) times a week. 8 tablet 12  . MULTIPLE VITAMIN tablet Take 1 tablet by mouth daily.    . nabumetone (RELAFEN) 750 MG tablet TAKE 1 TABLET BY MOUTH TWICE A DAY AS NEEDED FOR ARTHRITIS 180 tablet 3  . nitrofurantoin, macrocrystal-monohydrate, (MACROBID) 100 MG capsule Take one capsule once daily 90 capsule 1  . pantoprazole (PROTONIX) 40 MG tablet Take 1 tablet (40 mg total) by mouth daily. 90 tablet 3  . benzonatate (TESSALON) 100 MG capsule Take 1 capsule (100 mg total) by mouth 3 (three) times daily as needed. 20 capsule 0  . doxycycline (VIBRA-TABS) 100 MG tablet Take 1 tablet (100 mg total) by mouth 2 (two) times  daily. 20 tablet 0   No current facility-administered medications for this visit.     Allergies: Amoxicillin and Penicillins  Past Medical History:  Diagnosis Date  . Abnormal Pap smear of vagina 06/17/15   LGSIL  . Allergic rhinitis, cause unspecified   . Arthritis   . ARTHRITIS, KNEE 02/10/2009   Qualifier: Diagnosis of  By: Niel Hummer MD, Lorinda Creed   . BRCA negative 03/25/13  . Cervical cancer ([place]) 1984  . Cervical disc disease 02/11/2012  . Chronic LBP   . Degenerative arthritis of hip 02/11/2012  . Diverticulosis 02/11/2012  . GERD 08/10/2007   Qualifier: Diagnosis of  By: Sherlynn Stalls, CMA, [place]    . GERD (gastroesophageal reflux disease)   . GERD (gastroesophageal reflux disease)   . HEMORRHOIDS-INTERNAL 07/21/2010   Qualifier: Diagnosis of  By: Chester Holstein NP, Nevin Bloodgood    . Hiatal hernia   . Hyperlipidemia 02/13/2012  . Mitral valve prolapse   . OAB (overactive bladder)   . Obesity   . OVERACTIVE BLADDER 08/10/2007   Qualifier: Diagnosis of  By: Sherlynn Stalls, CMA, [place]    . Pneumonia   . PONV (postoperative nausea and vomiting)   . Recurrent UTI   . SUI (stress urinary incontinence, female)   . VAGINITIS, ATROPHIC 11/11/2008   Qualifier: Diagnosis of  By: Niel Hummer MD, Lorinda Creed     Past Surgical History:  Procedure Laterality Date  . BUNIONECTOMY  2007  . CERVICAL DISCECTOMY  2002  . CHOLECYSTECTOMY N/A 08/13/2013   Procedure: LAPAROSCOPIC CHOLECYSTECTOMY  WITH INTRAOPERATIVE CHOLANGIOGRAM;  Surgeon: Shann Medal, MD;  Location: [facility];  Service: General;  Laterality: N/A;  . CLOSED REDUCTION METATARSAL FRACTURE     rt side 06-03-18, left side 08-19-17  . COLONOSCOPY    . conization of cervix  1982   with D&C  . CYSTOCELE REPAIR  2003   rectocele repair  . EYE SURGERY Bilateral    lasik  . ORIF ANKLE FRACTURE Right 08/20/2017   Procedure: OPEN REDUCTION INTERNAL FIXATION (ORIF) ANKLE FRACTURE;  Surgeon: Meredith Pel, MD;  [facility];  Service: Orthopedics;  Laterality:  Right;  . SHOULDER ARTHROSCOPY  2004   frozen shoulder  . TONSILLECTOMY    . VAGINAL HYSTERECTOMY  1986    Family History  Problem Relation Age of Onset  . Breast cancer Mother        dx in her last 34's  . Heart disease Sister   . Breast cancer Sister        dx in her late 49's  . Diabetes Brother   . Heart disease Brother        triple bypass  . Diabetes Maternal Aunt   . Diabetes Maternal Uncle   . Cancer Maternal Grandmother        uterine  . Colon cancer Neg Hx   . Rectal cancer Neg Hx   . Esophageal cancer Neg Hx     Social History   Tobacco Use  . Smoking status: Never Smoker  . Smokeless tobacco: Never Used  Substance Use Topics  . Alcohol use: No    Alcohol/week: 0.0 standard drinks    Subjective:  Patient presents with cough/ congestion x 1 month; + sinus pain/ pressure; thick, yellow mucus; "just can't shake it." No fever, no chest pain; has tried OTC Mucinex DM, Dayquil with limited relief;     Objective:  Vitals:   09/26/18 1115  BP: 126/82  Pulse: 70  Temp: 98 F (36.7 C)  TempSrc: Oral  SpO2: 96%  Weight: 231 lb (104.8 kg)  Height: 5' 6.75" (1.695 m)    General: Well developed, well nourished, in no acute distress  Skin : Warm and dry.  Head: Normocephalic and atraumatic  Eyes: Sclera and conjunctiva clear; pupils round and reactive to light; extraocular movements intact  Ears: External normal; canals clear; tympanic membranes congested bilaterally Oropharynx: Pink, supple. No suspicious lesions  Neck: Supple without thyromegaly, adenopathy  Lungs: Respirations unlabored; clear to auscultation bilaterally without wheeze, rales, rhonchi  CVS exam: normal rate and regular rhythm.  Neurologic: Alert and oriented; speech intact; face symmetrical; moves all extremities well; CNII-XII intact without focal deficit   Assessment:  1. Acute sinusitis, recurrence not specified, unspecified location     Plan:  Rx for Doxycycline 100 mg bid x 10  days; patient does not do well with nasal sprays; Rx for Tessalon Perles tid prn; increase fluids, rest and follow-up worse, no better.   No follow-ups on file.  No orders of the defined types were placed in this encounter.   Requested Prescriptions   Signed Prescriptions Disp Refills  . doxycycline (VIBRA-TABS) 100 MG tablet 20 tablet 0    Sig: Take 1 tablet (100 mg total) by mouth 2 (two) times daily.  . benzonatate (TESSALON) 100 MG capsule 20 capsule 0    Sig: Take 1 capsule (100 mg total) by mouth 3 (three) times daily as needed.

## 2018-10-01 ENCOUNTER — Telehealth: Payer: Self-pay

## 2018-10-01 MED ORDER — PREDNISONE 20 MG PO TABS: 40.0000 mg | ORAL_TABLET | Freq: Every day | ORAL | 0 refills | Status: DC

## 2018-10-01 NOTE — Addendum Note (Signed)
Addended by: Sherlene Shams on: 10/01/2018 03:02 PM   Modules accepted: Orders

## 2018-10-01 NOTE — Telephone Encounter (Signed)
Called and left message for patient and also sent her a my-chart message. 

## 2018-10-01 NOTE — Telephone Encounter (Signed)
Okay- I will send in the prednisone like we discussed. If symptoms are still persisting after this, return to office please.

## 2018-10-01 NOTE — Telephone Encounter (Signed)
Copied from [name] [id number]. Topic: General - Other >> Oct 01, 2018  8:30 AM [name] wrote:  Pt said she was told by [name] that if she was no better to call back. She said the cough medicine did help but her congestion is not better

## 2018-10-16 ENCOUNTER — Ambulatory Visit (INDEPENDENT_AMBULATORY_CARE_PROVIDER_SITE_OTHER)
Admission: RE | Admit: 2018-10-16 | Discharge: 2018-10-16 | Disposition: A | Discharge status: Home / Self Care | Payer: Medicare Other | Source: Ambulatory Visit | Attending: Internal Medicine | Admitting: Internal Medicine

## 2018-10-16 ENCOUNTER — Ambulatory Visit (INDEPENDENT_AMBULATORY_CARE_PROVIDER_SITE_OTHER): Payer: Medicare Other | Admitting: Internal Medicine

## 2018-10-16 ENCOUNTER — Encounter: Payer: Self-pay | Admitting: Internal Medicine

## 2018-10-16 VITALS — BP 122/70 | HR 91 | Temp 98.8°F | Ht 66.75 in | Wt 223.0 lb

## 2018-10-16 DIAGNOSIS — R05 Cough: Secondary | ICD-10-CM | POA: Diagnosis not present

## 2018-10-16 DIAGNOSIS — R7302 Impaired glucose tolerance (oral): Secondary | ICD-10-CM

## 2018-10-16 DIAGNOSIS — R059 Cough, unspecified: Secondary | ICD-10-CM

## 2018-10-16 MED ORDER — LEVOFLOXACIN 500 MG PO TABS: 500.0000 mg | ORAL_TABLET | Freq: Every day | ORAL | 0 refills | Status: AC

## 2018-10-16 MED ORDER — HYDROCODONE-HOMATROPINE 5-1.5 MG/5ML PO SYRP: 5.0000 mL | ORAL_SOLUTION | Freq: Four times a day (QID) | ORAL | 0 refills | Status: AC | PRN

## 2018-10-16 NOTE — Patient Instructions (Signed)
Please take all new medication as prescribed - the antibiotic, cough medicine as needed  You can also take Mucinex (or it's generic off brand) for congestion, and tylenol as needed for pain.  Please continue all other medications as before, and refills have been done if requested.  Please have the pharmacy call with any other refills you may need.  Please keep your appointments with your specialists as you may have planned  Please go to the XRAY Department in the Basement (go straight as you get off the elevator) for the x-ray testing  You will be contacted by phone if any changes need to be made immediately.  Otherwise, you will receive a letter about your results with an explanation, but please check with MyChart first.  Please remember to sign up for MyChart if you have not done so, as this will be important to you in the future with finding out test results, communicating by private email, and scheduling acute appointments online when needed.

## 2018-10-16 NOTE — Assessment & Plan Note (Signed)
stable overall by history and exam, recent data reviewed with pt, and pt to continue medical treatment as before,  to f/u any worsening symptoms or concerns  

## 2018-10-16 NOTE — Progress Notes (Signed)
Subjective:    Patient ID: [name], female    DOB: 03-04-1951, 67 y.o.   MRN: [id number]  HPI  Here with acute onset mild to mod 2-3 days ST, HA, general weakness and malaise, with prod cough greenish sputum, but Pt denies chest pain, increased sob or doe, wheezing, orthopnea, PND, increased LE swelling, palpitations, dizziness or syncope.  Has had recurring symptoms over 9 weeks, not sure how much is allergies or infection.  Pt denies new neurological symptoms such as new headache, or facial or extremity weakness or numbness   Pt denies polydipsia, polyuria,  Past Medical History:  Diagnosis Date  . Abnormal Pap smear of vagina 06/17/15   LGSIL  . Allergic rhinitis, cause unspecified   . Arthritis   . ARTHRITIS, KNEE 02/10/2009   Qualifier: Diagnosis of  By: Niel Hummer MD, Lorinda Creed   . BRCA negative 03/25/13  . Cervical cancer ([place]) 1984  . Cervical disc disease 02/11/2012  . Chronic LBP   . Degenerative arthritis of hip 02/11/2012  . Diverticulosis 02/11/2012  . GERD 08/10/2007   Qualifier: Diagnosis of  By: Sherlynn Stalls, CMA, [place]    . GERD (gastroesophageal reflux disease)   . GERD (gastroesophageal reflux disease)   . HEMORRHOIDS-INTERNAL 07/21/2010   Qualifier: Diagnosis of  By: Chester Holstein NP, Nevin Bloodgood    . Hiatal hernia   . Hyperlipidemia 02/13/2012  . Mitral valve prolapse   . OAB (overactive bladder)   . Obesity   . OVERACTIVE BLADDER 08/10/2007   Qualifier: Diagnosis of  By: Sherlynn Stalls, CMA, [place]    . Pneumonia   . PONV (postoperative nausea and vomiting)   . Recurrent UTI   . SUI (stress urinary incontinence, female)   . VAGINITIS, ATROPHIC 11/11/2008   Qualifier: Diagnosis of  By: Niel Hummer MD, Lorinda Creed    Past Surgical History:  Procedure Laterality Date  . BUNIONECTOMY  2007  . CERVICAL DISCECTOMY  2002  . CHOLECYSTECTOMY N/A 08/13/2013   Procedure: LAPAROSCOPIC CHOLECYSTECTOMY WITH INTRAOPERATIVE CHOLANGIOGRAM;  Surgeon: Shann Medal, MD;  Location: [facility];  Service: General;   Laterality: N/A;  . CLOSED REDUCTION METATARSAL FRACTURE     rt side 06-03-18, left side 08-19-17  . COLONOSCOPY    . conization of cervix  1982   with D&C  . CYSTOCELE REPAIR  2003   rectocele repair  . EYE SURGERY Bilateral    lasik  . ORIF ANKLE FRACTURE Right 08/20/2017   Procedure: OPEN REDUCTION INTERNAL FIXATION (ORIF) ANKLE FRACTURE;  Surgeon: Meredith Pel, MD;  Location: [facility];  Service: Orthopedics;  Laterality: Right;  . SHOULDER ARTHROSCOPY  2004   frozen shoulder  . TONSILLECTOMY    . VAGINAL HYSTERECTOMY  1986    reports that she has never smoked. She has never used smokeless tobacco. She reports that she does not drink alcohol or use drugs. family history includes Breast cancer in her mother and sister; Cancer in her maternal grandmother; Diabetes in her brother, maternal aunt, and maternal uncle; Heart disease in her brother and sister. Allergies  Allergen Reactions  . Amoxicillin     REACTION: unspecified  . Penicillins Other (See Comments)    Unknown reaction   Current Outpatient Medications on File Prior to Visit  Medication Sig Dispense Refill  . benzonatate (TESSALON) 100 MG capsule Take 1 capsule (100 mg total) by mouth 3 (three) times daily as needed. 20 capsule 0  . doxycycline (VIBRA-TABS) 100 MG tablet Take 1 tablet (100  mg total) by mouth 2 (two) times daily. 20 tablet 0  . Estradiol (YUVAFEM) 10 MCG TABS vaginal tablet Place 1 tablet (10 mcg total) vaginally 2 (two) times a week. 8 tablet 12  . MULTIPLE VITAMIN tablet Take 1 tablet by mouth daily.    . nabumetone (RELAFEN) 750 MG tablet TAKE 1 TABLET BY MOUTH TWICE A DAY AS NEEDED FOR ARTHRITIS 180 tablet 3  . nitrofurantoin, macrocrystal-monohydrate, (MACROBID) 100 MG capsule Take one capsule once daily 90 capsule 1  . pantoprazole (PROTONIX) 40 MG tablet Take 1 tablet (40 mg total) by mouth daily. 90 tablet 3  . predniSONE (DELTASONE) 20 MG tablet Take 2 tablets (40 mg total) by mouth daily with  breakfast. 10 tablet 0   No current facility-administered medications on file prior to visit.    Review of Systems  Constitutional: Negative for other unusual diaphoresis or sweats HENT: Negative for ear discharge or swelling Eyes: Negative for other worsening visual disturbances Respiratory: Negative for stridor or other swelling  Gastrointestinal: Negative for worsening distension or other blood Genitourinary: Negative for retention or other urinary change Musculoskeletal: Negative for other MSK pain or swelling Skin: Negative for color change or other new lesions Neurological: Negative for worsening tremors and other numbness  Psychiatric/Behavioral: Negative for worsening agitation or other fatigue All other system neg per pt    Objective:   Physical Exam BP 122/70   Pulse 91   Temp 98.8 F (37.1 C) (Oral)   Ht 5' 6.75" (1.695 m)   Wt 223 lb (101.2 kg)   SpO2 94%   BMI 35.19 kg/m  VS noted, mild ill Constitutional: Pt appears in NAD HENT: Head: NCAT.  Right Ear: External ear normal.  Left Ear: External ear normal.  Eyes: . Pupils are equal, round, and reactive to light. Conjunctivae and EOM are normal Bilat tm's with mild erythema.  Max sinus areas non tender.  Pharynx with mild erythema, no exudate Nose: without d/c or deformity Neck: Neck supple. Gross normal ROM Cardiovascular: Normal rate and regular rhythm.   Pulmonary/Chest: Effort normal and breath sounds decresased bilat without rales or wheezing.  Neurological: Pt is alert. At baseline orientation, motor grossly intact Skin: Skin is warm. No rashes, other new lesions, no LE edema Psychiatric: Pt behavior is normal without agitation  No other exam findings Lab Results  Component Value Date   WBC 6.7 07/25/2018   HGB 13.4 07/25/2018   HCT 39.3 07/25/2018   PLT 253.0 07/25/2018   GLUCOSE 88 07/25/2018   CHOL 160 07/25/2018   TRIG 81.0 07/25/2018   HDL 43.20 07/25/2018   LDLDIRECT 135.1 05/08/2012    LDLCALC 100 (H) 07/25/2018   ALT 12 07/25/2018   AST 15 07/25/2018   NA 141 07/25/2018   K 4.1 07/25/2018   CL 107 07/25/2018   CREATININE 0.83 07/25/2018   BUN 15 07/25/2018   CO2 28 07/25/2018   TSH 4.25 07/25/2018   HGBA1C 6.0 07/25/2018         Assessment & Plan:

## 2018-10-16 NOTE — Assessment & Plan Note (Signed)
Mild to mod, c/w bronchitis vs pna, for cxr, for antibx course, cough med prn, mucinex bid prn, cough med prn, to f/u any worsening symptoms or concerns

## 2018-10-22 IMAGING — DX DG FOOT COMPLETE 3+V*R*
3 series · 3 of 3 positions shown · non-contrast
Comparison: None.

CLINICAL DATA: Rolled right foot.  Pain and swelling

EXAM:
RIGHT FOOT COMPLETE - 3+ VIEW

[foot ap]
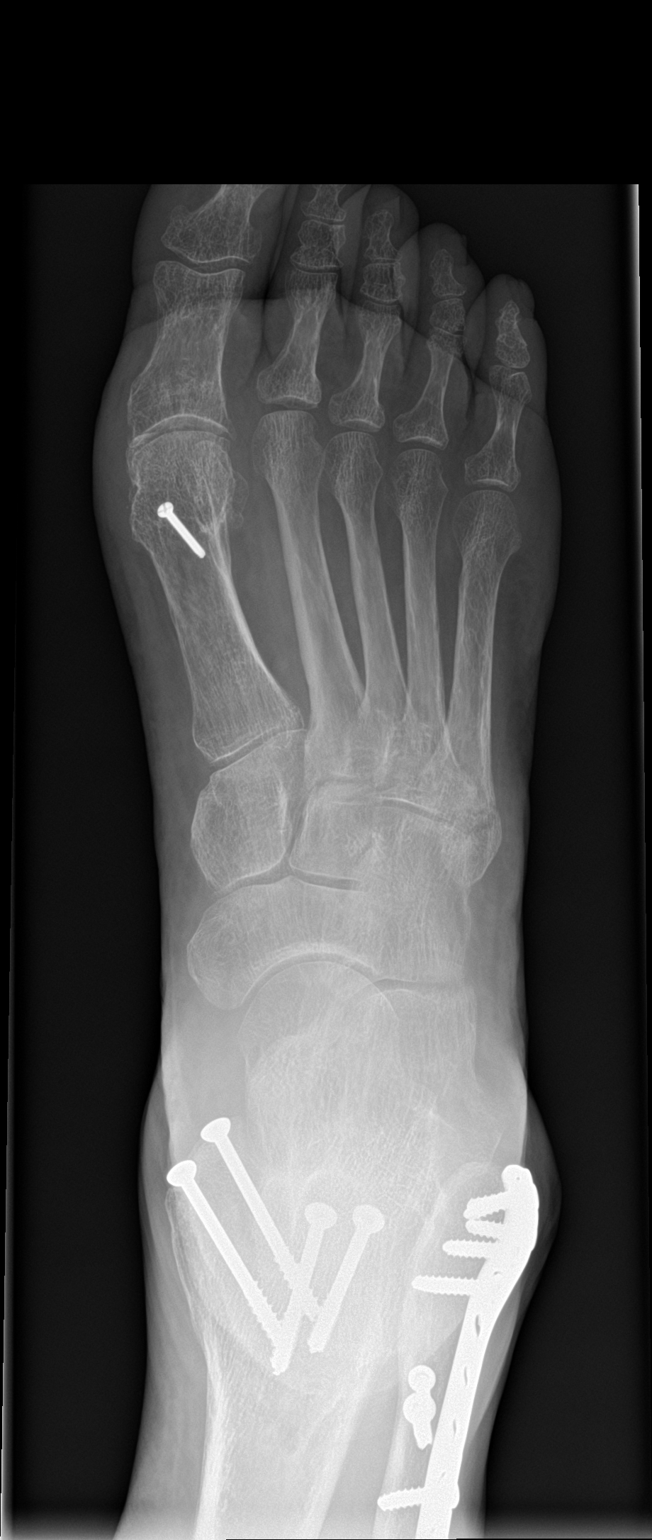

[foot obl]
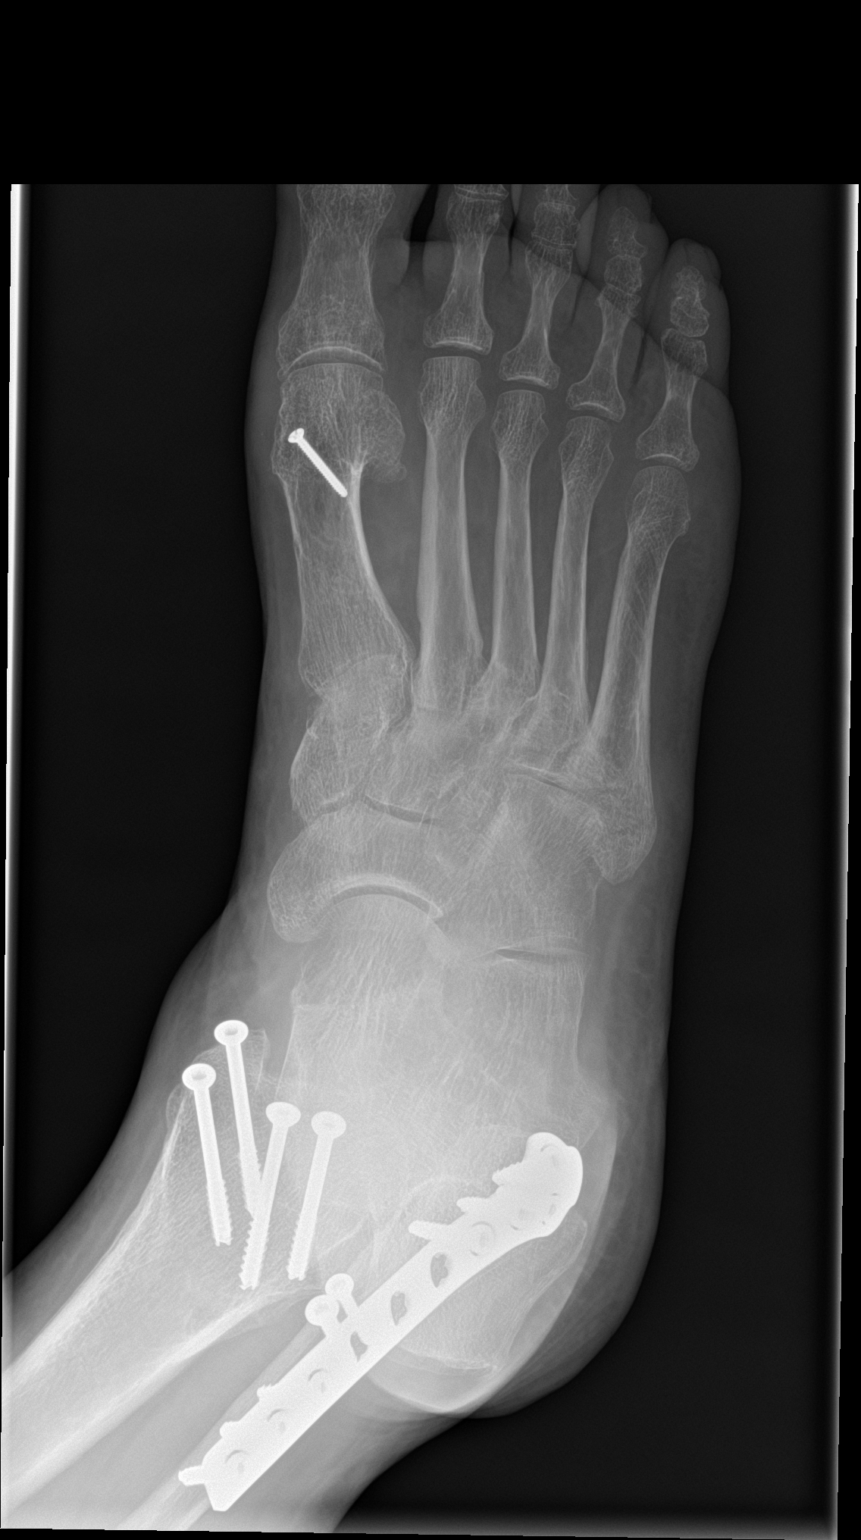

[foot lat]
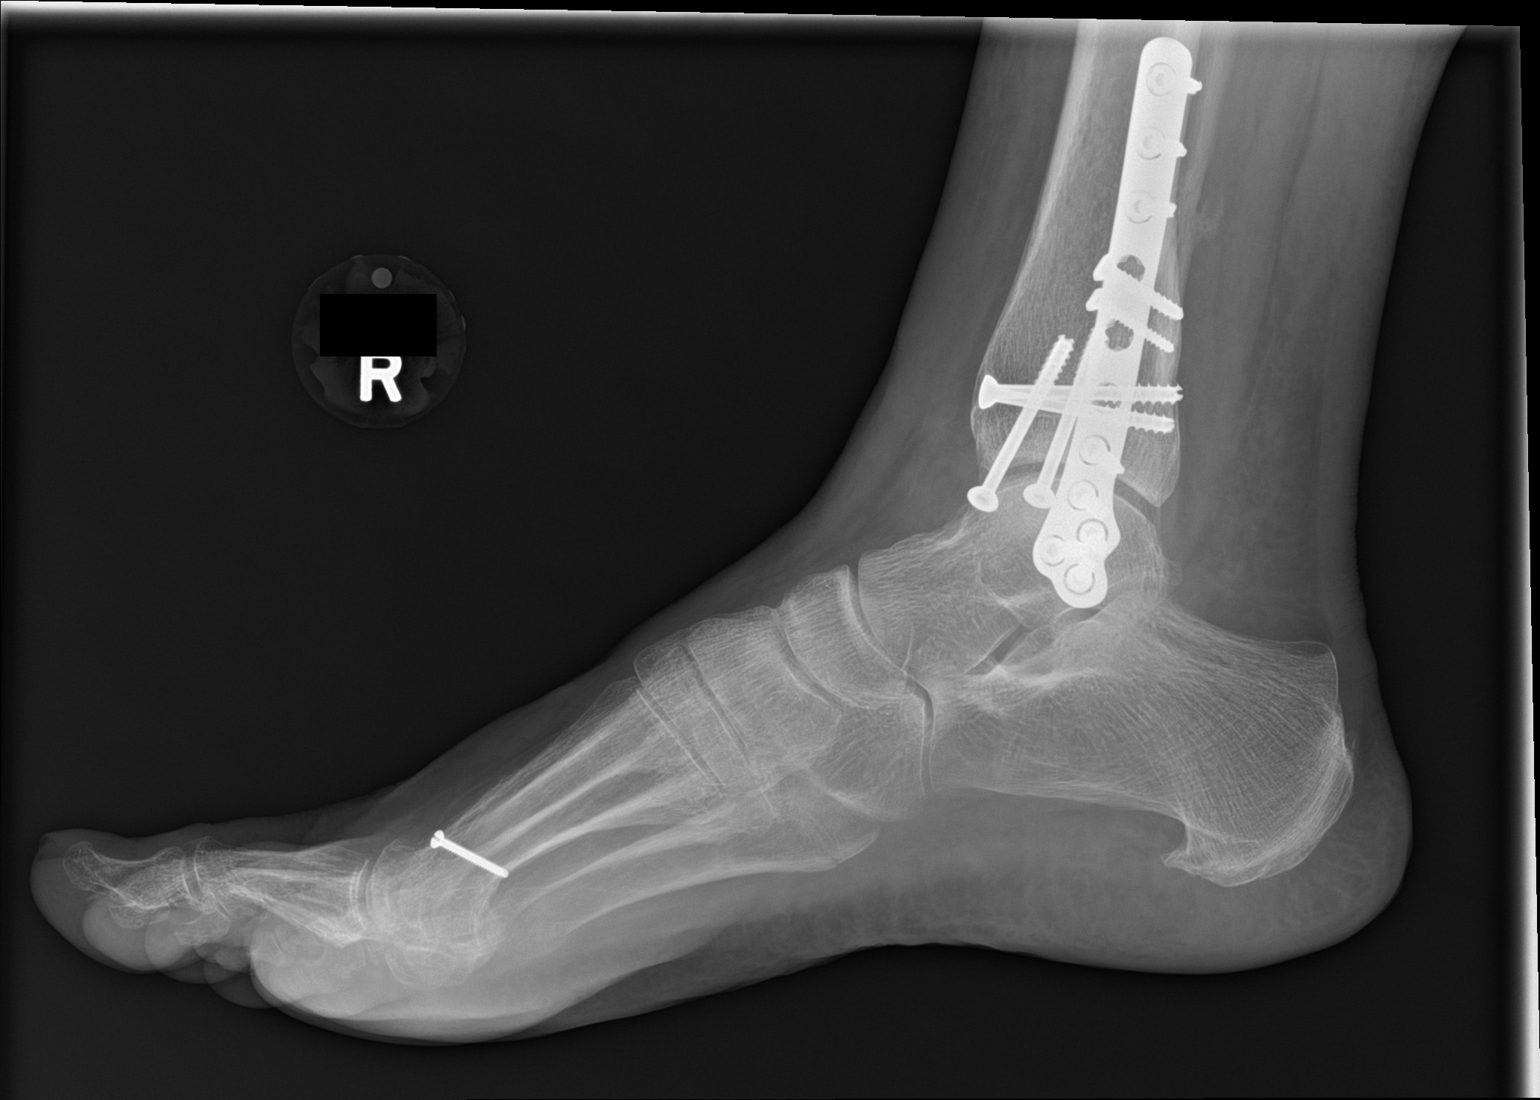

[3 of 3 positions shown; findings below may reference images not displayed]

FINDINGS: There is a fracture at the base of the right 5th metatarsal,
nondisplaced. Remote internal fixation in the distal tibia and
fibula. Screw within the distal 1st metatarsal likely from prior
osteotomy.
IMPRESSION: Nondisplaced fracture the base of the right 5th metatarsal.

## 2018-10-24 ENCOUNTER — Encounter: Payer: Self-pay | Admitting: Pulmonary Disease

## 2018-10-24 ENCOUNTER — Ambulatory Visit (INDEPENDENT_AMBULATORY_CARE_PROVIDER_SITE_OTHER): Payer: Medicare Other | Admitting: Pulmonary Disease

## 2018-10-24 DIAGNOSIS — R300 Dysuria: Secondary | ICD-10-CM

## 2018-10-24 DIAGNOSIS — G4733 Obstructive sleep apnea (adult) (pediatric): Secondary | ICD-10-CM

## 2018-10-24 DIAGNOSIS — Z9989 Dependence on other enabling machines and devices: Secondary | ICD-10-CM

## 2018-10-24 NOTE — Assessment & Plan Note (Addendum)
She is maintained on auto CPAP settings have settled on with nasal mask with chinstrap.  This seems to be helping with improvement in her daytime somnolence and fatigue.  She is committed to using this in the long-term.  She had excellent compliance until her recent URI  CPAP supplies will be renewed for a year  Weight loss encouraged, compliance with goal of at least 4-6 hrs every night is the expectation. Advised against medications with sedative side effects Cautioned against driving when sleepy - understanding that sleepiness will vary on a day to day basis

## 2018-10-24 NOTE — Progress Notes (Signed)
   Subjective:    Patient ID: [name], female    DOB: 03-10-1951, 67 y.o.   MRN: [id number]  HPI  67 year old for follow-up of moderate OSA. She was diagnosed 11/2017 with home sleep test showing AHI of 20/hour and started on auto CPAP with nasal mask.  She had a mild leak and transition to a full facemask for little bit but did not tolerate this well and went back to a nasal mask with chinstrap.  We reviewed download results over her phone, her events had decreased to about 2 an hour, she was feeling more awake and rejuvenated with more energy and feels that CPAP is helping. For the past few weeks she has had a bad head cold and is on her second round of antibiotic and has a lot of upper airway coughing due to which she has been not been able to use her machine.  She needs her supplies renewed.  She is also concerned about the fact that she is using nitrofurantoin for about 7 years for recurrent UTIs   Significant tests/ events reviewed  10/2017 HST - 232 lbs-  moderate OSA with 20/h , esp supine >> auto CPAP  Review of Systems neg for any significant sore throat, dysphagia, itching, sneezing, nasal congestion or excess/ purulent secretions, fever, chills, sweats, unintended wt loss, pleuritic or exertional cp, hempoptysis, orthopnea pnd or change in chronic leg swelling. Also denies presyncope, palpitations, heartburn, abdominal pain, nausea, vomiting, diarrhea or change in bowel or urinary habits, dysuria,hematuria, rash, arthralgias, visual complaints, headache, numbness weakness or ataxia.     Objective:   Physical Exam   Gen. Pleasant, obese, in no distress ENT - no lesions, no post nasal drip Neck: No JVD, no thyromegaly, no carotid bruits Lungs: no use of accessory muscles, no dullness to percussion, decreased without rales or rhonchi  Cardiovascular: Rhythm regular, heart sounds  normal, no murmurs or gallops, no peripheral edema Musculoskeletal: No deformities, no cyanosis  or clubbing , no tremors        Assessment & Plan:

## 2018-10-24 NOTE — Patient Instructions (Signed)
  We will check CPAP report in 1 month. CPAP supplies will be renewed  Suggest you discuss with urologist about stopping nitrofurantoin

## 2018-10-24 NOTE — Assessment & Plan Note (Signed)
Discussed pulmonary side effects of nitrofurantoin and she will stop taking this and discuss with her urologist about alternatives

## 2018-10-24 NOTE — Addendum Note (Signed)
Addended by: Valerie Salts on: 10/24/2018 12:17 PM   Modules accepted: Orders

## 2018-11-27 DIAGNOSIS — S8292XA Unspecified fracture of left lower leg, initial encounter for closed fracture: Secondary | ICD-10-CM | POA: Diagnosis not present

## 2019-01-09 ENCOUNTER — Other Ambulatory Visit (INDEPENDENT_AMBULATORY_CARE_PROVIDER_SITE_OTHER): Payer: Medicare Other

## 2019-01-09 ENCOUNTER — Encounter: Payer: Self-pay | Admitting: Internal Medicine

## 2019-01-09 ENCOUNTER — Ambulatory Visit (INDEPENDENT_AMBULATORY_CARE_PROVIDER_SITE_OTHER): Payer: Medicare Other | Admitting: Internal Medicine

## 2019-01-09 VITALS — BP 116/70 | HR 82 | Temp 98.1°F | Ht 66.75 in | Wt 231.0 lb

## 2019-01-09 DIAGNOSIS — G8929 Other chronic pain: Secondary | ICD-10-CM

## 2019-01-09 DIAGNOSIS — M545 Low back pain, unspecified: Secondary | ICD-10-CM

## 2019-01-09 DIAGNOSIS — R399 Unspecified symptoms and signs involving the genitourinary system: Secondary | ICD-10-CM | POA: Diagnosis not present

## 2019-01-09 DIAGNOSIS — R350 Frequency of micturition: Secondary | ICD-10-CM | POA: Diagnosis not present

## 2019-01-09 DIAGNOSIS — R7302 Impaired glucose tolerance (oral): Secondary | ICD-10-CM | POA: Diagnosis not present

## 2019-01-09 LAB — URINALYSIS, ROUTINE W REFLEX MICROSCOPIC
Bilirubin Urine: NEGATIVE
KETONES UR: NEGATIVE
Nitrite: POSITIVE — AB
SPECIFIC GRAVITY, URINE: 1.025 (ref 1.000–1.030)
Total Protein, Urine: NEGATIVE
Urine Glucose: NEGATIVE
Urobilinogen, UA: 0.2 (ref 0.0–1.0)
pH: 5.5 (ref 5.0–8.0)

## 2019-01-09 MED ORDER — CIPROFLOXACIN HCL 500 MG PO TABS: 500.0000 mg | ORAL_TABLET | Freq: Two times a day (BID) | ORAL | 0 refills | Status: AC

## 2019-01-09 NOTE — Assessment & Plan Note (Signed)
stable overall by history and exam, recent data reviewed with pt, and pt to continue medical treatment as before,  to f/u any worsening symptoms or concerns  

## 2019-01-09 NOTE — Patient Instructions (Signed)
Please take all new medication as prescribed - the antibiotic  Please return for the culture later today, but if not able, you should wait but start the antibiotic today  Please continue all other medications as before, and refills have been done if requested.  Please have the pharmacy call with any other refills you may need.  Please keep your appointments with your specialists as you may have planned

## 2019-01-09 NOTE — Progress Notes (Signed)
Subjective:    Patient ID: [name], female    DOB: 1951/03/13, 68 y.o.   MRN: [id number]  HPI  Here with c/o 2-3 days urinary frequency, fever, chills and feeling poorly with general weakness and malaise; Denies urinary symptoms such as dysuria, urgency, flank pain, hematuria or n/v.  Has hx of recurrent UTI, used to be on macrobid daily but took herself off as a friend has an unusual pulmonary reaction and died.  Pt denies chest pain, increased sob or doe, wheezing, orthopnea, PND, increased LE swelling, palpitations, dizziness or syncope.  Pt denies new neurological symptoms such as new headache, or facial or extremity weakness or numbness   Pt denies polydipsia, polyuria.   Pt continues to have recurring LBP without change in severity, bowel or bladder change, fever, wt loss,  worsening LE pain/numbness/weakness, gait change or falls. Past Medical History:  Diagnosis Date  . Abnormal Pap smear of vagina 06/17/15   LGSIL  . Allergic rhinitis, cause unspecified   . Arthritis   . ARTHRITIS, KNEE 02/10/2009   Qualifier: Diagnosis of  By: Niel Hummer MD, Lorinda Creed   . BRCA negative 03/25/13  . Cervical cancer ([place]) 1984  . Cervical disc disease 02/11/2012  . Chronic LBP   . Degenerative arthritis of hip 02/11/2012  . Diverticulosis 02/11/2012  . GERD 08/10/2007   Qualifier: Diagnosis of  By: Sherlynn Stalls, CMA, [place]    . GERD (gastroesophageal reflux disease)   . GERD (gastroesophageal reflux disease)   . HEMORRHOIDS-INTERNAL 07/21/2010   Qualifier: Diagnosis of  By: Chester Holstein NP, Nevin Bloodgood    . Hiatal hernia   . Hyperlipidemia 02/13/2012  . Mitral valve prolapse   . OAB (overactive bladder)   . Obesity   . OVERACTIVE BLADDER 08/10/2007   Qualifier: Diagnosis of  By: Sherlynn Stalls, CMA, [place]    . Pneumonia   . PONV (postoperative nausea and vomiting)   . Recurrent UTI   . SUI (stress urinary incontinence, female)   . VAGINITIS, ATROPHIC 11/11/2008   Qualifier: Diagnosis of  By: Niel Hummer MD, Lorinda Creed     Past Surgical History:  Procedure Laterality Date  . BUNIONECTOMY  2007  . CERVICAL DISCECTOMY  2002  . CHOLECYSTECTOMY N/A 08/13/2013   Procedure: LAPAROSCOPIC CHOLECYSTECTOMY WITH INTRAOPERATIVE CHOLANGIOGRAM;  Surgeon: Shann Medal, MD;  Location: [facility];  Service: General;  Laterality: N/A;  . CLOSED REDUCTION METATARSAL FRACTURE     rt side 06-03-18, left side 08-19-17  . COLONOSCOPY    . conization of cervix  1982   with D&C  . CYSTOCELE REPAIR  2003   rectocele repair  . EYE SURGERY Bilateral    lasik  . ORIF ANKLE FRACTURE Right 08/20/2017   Procedure: OPEN REDUCTION INTERNAL FIXATION (ORIF) ANKLE FRACTURE;  Surgeon: Meredith Pel, MD;  [facility];  Service: Orthopedics;  Laterality: Right;  . SHOULDER ARTHROSCOPY  2004   frozen shoulder  . TONSILLECTOMY    . VAGINAL HYSTERECTOMY  1986    reports that she has never smoked. She has never used smokeless tobacco. She reports that she does not drink alcohol or use drugs. family history includes Breast cancer in her mother and sister; Cancer in her maternal grandmother; Diabetes in her brother, maternal aunt, and maternal uncle; Heart disease in her brother and sister. Allergies  Allergen Reactions  . Amoxicillin     REACTION: unspecified  . Penicillins Other (See Comments)    Unknown reaction   Current Outpatient Medications  on File Prior to Visit  Medication Sig Dispense Refill  . Estradiol (YUVAFEM) 10 MCG TABS vaginal tablet Place 1 tablet (10 mcg total) vaginally 2 (two) times a week. 8 tablet 12  . MULTIPLE VITAMIN tablet Take 1 tablet by mouth daily.    . nabumetone (RELAFEN) 750 MG tablet TAKE 1 TABLET BY MOUTH TWICE A DAY AS NEEDED FOR ARTHRITIS 180 tablet 3  . nitrofurantoin, macrocrystal-monohydrate, (MACROBID) 100 MG capsule Take one capsule once daily 90 capsule 1  . pantoprazole (PROTONIX) 40 MG tablet Take 1 tablet (40 mg total) by mouth daily. 90 tablet 3   No current facility-administered  medications on file prior to visit.    ROS:    Constitutional: Negative for other unusual diaphoresis or sweats HENT: Negative for ear discharge or swelling Eyes: Negative for other worsening visual disturbances Respiratory: Negative for stridor or other swelling  Gastrointestinal: Negative for worsening distension or other blood Genitourinary: Negative for retention or other urinary change Musculoskeletal: Negative for other MSK pain or swelling Skin: Negative for color change or other new lesions Neurological: Negative for worsening tremors and other numbness  Psychiatric/Behavioral: Negative for worsening agitation or other fatigue All other system neg per pt    Objective:   Physical Exam BP 116/70   Pulse 82   Temp 98.1 F (36.7 C) (Oral)   Ht 5' 6.75" (1.695 m)   Wt 231 lb (104.8 kg)   SpO2 93%   BMI 36.45 kg/m  VS noted, not ill appaering Constitutional: Pt appears in NAD HENT: Head: NCAT.  Right Ear: External ear normal.  Left Ear: External ear normal.  Eyes: . Pupils are equal, round, and reactive to light. Conjunctivae and EOM are normal Nose: without d/c or deformity Neck: Neck supple. Gross normal ROM Cardiovascular: Normal rate and regular rhythm.   Pulmonary/Chest: Effort normal and breath sounds without rales or wheezing.  Abd:  Soft, NT, ND, + BS, no organomegaly - benign, no flank tender Neurological: Pt is alert. At baseline orientation, motor grossly intact Skin: Skin is warm. No rashes, other new lesions, no LE edema Psychiatric: Pt behavior is normal without agitation  No other exam findings Lab Results  Component Value Date   WBC 6.7 07/25/2018   HGB 13.4 07/25/2018   HCT 39.3 07/25/2018   PLT 253.0 07/25/2018   GLUCOSE 88 07/25/2018   CHOL 160 07/25/2018   TRIG 81.0 07/25/2018   HDL 43.20 07/25/2018   LDLDIRECT 135.1 05/08/2012   LDLCALC 100 (H) 07/25/2018   ALT 12 07/25/2018   AST 15 07/25/2018   NA 141 07/25/2018   K 4.1 07/25/2018   CL  107 07/25/2018   CREATININE 0.83 07/25/2018   BUN 15 07/25/2018   CO2 28 07/25/2018   TSH 4.25 07/25/2018   HGBA1C 6.0 07/25/2018       Assessment & Plan:

## 2019-01-09 NOTE — Assessment & Plan Note (Signed)
Though exam somewhat benign, I suspect she does have uti, urine studies pending, ok to start cipro course asd,  to f/u any worsening symptoms or concerns

## 2019-01-11 LAB — URINE CULTURE
MICRO NUMBER:: 275844
SPECIMEN QUALITY:: ADEQUATE

## 2019-01-17 ENCOUNTER — Other Ambulatory Visit: Payer: Self-pay

## 2019-01-17 ENCOUNTER — Ambulatory Visit (INDEPENDENT_AMBULATORY_CARE_PROVIDER_SITE_OTHER): Payer: Medicare Other | Admitting: Internal Medicine

## 2019-01-17 ENCOUNTER — Encounter: Payer: Self-pay | Admitting: Internal Medicine

## 2019-01-17 VITALS — BP 118/64 | HR 119 | Temp 98.0°F | Ht 66.75 in | Wt 229.0 lb

## 2019-01-17 DIAGNOSIS — R350 Frequency of micturition: Secondary | ICD-10-CM

## 2019-01-17 DIAGNOSIS — H1032 Unspecified acute conjunctivitis, left eye: Secondary | ICD-10-CM

## 2019-01-17 DIAGNOSIS — R6889 Other general symptoms and signs: Secondary | ICD-10-CM | POA: Diagnosis not present

## 2019-01-17 DIAGNOSIS — J069 Acute upper respiratory infection, unspecified: Secondary | ICD-10-CM | POA: Diagnosis not present

## 2019-01-17 LAB — POC INFLUENZA A&B (BINAX/QUICKVUE)
Influenza A, POC: NEGATIVE
Influenza B, POC: NEGATIVE

## 2019-01-17 MED ORDER — ERYTHROMYCIN 5 MG/GM OP OINT: 1.0000 "application " | TOPICAL_OINTMENT | Freq: Four times a day (QID) | OPHTHALMIC | 0 refills | Status: DC

## 2019-01-17 MED ORDER — DOXYCYCLINE HYCLATE 100 MG PO TABS: 100.0000 mg | ORAL_TABLET | Freq: Two times a day (BID) | ORAL | 0 refills | Status: DC

## 2019-01-17 NOTE — Patient Instructions (Signed)
Your flu testing was negative  Please take all new medication as prescribed - the pill antibiotic, and eye antibiotic as well  Please continue all other medications as before, and refills have been done if requested.  Please have the pharmacy call with any other refills you may need.  Please keep your appointments with your specialists as you may have planned

## 2019-01-17 NOTE — Progress Notes (Signed)
Subjective:    Patient ID: [name], female    DOB: 02-22-1951, 68 y.o.   MRN: [id number]  HPI   Here with 2-3 days acute onset fever, left eye pain and weepiness, pressure, headache, general weakness and malaise, and greenish d/c, with mild ST and cough, but pt denies chest pain, wheezing, increased sob or doe, orthopnea, PND, increased LE swelling, palpitations, dizziness or syncope.  Pt denies new neurological symptoms such as new headache, or facial or extremity weakness or numbness   Pt denies polydipsia, polyuria.   Past Medical History:  Diagnosis Date  . Abnormal Pap smear of vagina 06/17/15   LGSIL  . Allergic rhinitis, cause unspecified   . Arthritis   . ARTHRITIS, KNEE 02/10/2009   Qualifier: Diagnosis of  By: Niel Hummer MD, Lorinda Creed   . BRCA negative 03/25/13  . Cervical cancer ([place]) 1984  . Cervical disc disease 02/11/2012  . Chronic LBP   . Degenerative arthritis of hip 02/11/2012  . Diverticulosis 02/11/2012  . GERD 08/10/2007   Qualifier: Diagnosis of  By: Sherlynn Stalls, CMA, [place]    . GERD (gastroesophageal reflux disease)   . GERD (gastroesophageal reflux disease)   . HEMORRHOIDS-INTERNAL 07/21/2010   Qualifier: Diagnosis of  By: Chester Holstein NP, Nevin Bloodgood    . Hiatal hernia   . Hyperlipidemia 02/13/2012  . Mitral valve prolapse   . OAB (overactive bladder)   . Obesity   . OVERACTIVE BLADDER 08/10/2007   Qualifier: Diagnosis of  By: Sherlynn Stalls, CMA, [place]    . Pneumonia   . PONV (postoperative nausea and vomiting)   . Recurrent UTI   . SUI (stress urinary incontinence, female)   . VAGINITIS, ATROPHIC 11/11/2008   Qualifier: Diagnosis of  By: Niel Hummer MD, Lorinda Creed    Past Surgical History:  Procedure Laterality Date  . BUNIONECTOMY  2007  . CERVICAL DISCECTOMY  2002  . CHOLECYSTECTOMY N/A 08/13/2013   Procedure: LAPAROSCOPIC CHOLECYSTECTOMY WITH INTRAOPERATIVE CHOLANGIOGRAM;  Surgeon: Shann Medal, MD;  Location: [facility];  Service: General;  Laterality: N/A;  . CLOSED REDUCTION  METATARSAL FRACTURE     rt side 06-03-18, left side 08-19-17  . COLONOSCOPY    . conization of cervix  1982   with D&C  . CYSTOCELE REPAIR  2003   rectocele repair  . EYE SURGERY Bilateral    lasik  . ORIF ANKLE FRACTURE Right 08/20/2017   Procedure: OPEN REDUCTION INTERNAL FIXATION (ORIF) ANKLE FRACTURE;  Surgeon: Meredith Pel, MD;  [facility];  Service: Orthopedics;  Laterality: Right;  . SHOULDER ARTHROSCOPY  2004   frozen shoulder  . TONSILLECTOMY    . VAGINAL HYSTERECTOMY  1986    reports that she has never smoked. She has never used smokeless tobacco. She reports that she does not drink alcohol or use drugs. family history includes Breast cancer in her mother and sister; Cancer in her maternal grandmother; Diabetes in her brother, maternal aunt, and maternal uncle; Heart disease in her brother and sister. Allergies  Allergen Reactions  . Amoxicillin     REACTION: unspecified  . Penicillins Other (See Comments)    Unknown reaction   Current Outpatient Medications on File Prior to Visit  Medication Sig Dispense Refill  . ciprofloxacin (CIPRO) 500 MG tablet Take 1 tablet (500 mg total) by mouth 2 (two) times daily for 10 days. 20 tablet 0  . Estradiol (YUVAFEM) 10 MCG TABS vaginal tablet Place 1 tablet (10 mcg total) vaginally 2 (two)  times a week. 8 tablet 12  . MULTIPLE VITAMIN tablet Take 1 tablet by mouth daily.    . nabumetone (RELAFEN) 750 MG tablet TAKE 1 TABLET BY MOUTH TWICE A DAY AS NEEDED FOR ARTHRITIS 180 tablet 3  . nitrofurantoin, macrocrystal-monohydrate, (MACROBID) 100 MG capsule Take one capsule once daily 90 capsule 1  . pantoprazole (PROTONIX) 40 MG tablet Take 1 tablet (40 mg total) by mouth daily. 90 tablet 3   No current facility-administered medications on file prior to visit.    Review of Systems  Constitutional: Negative for other unusual diaphoresis or sweats HENT: Negative for ear discharge or swelling Eyes: Negative for other worsening  visual disturbances Respiratory: Negative for stridor or other swelling  Gastrointestinal: Negative for worsening distension or other blood Genitourinary: Negative for retention or other urinary change Musculoskeletal: Negative for other MSK pain or swelling Skin: Negative for color change or other new lesions Neurological: Negative for worsening tremors and other numbness  Psychiatric/Behavioral: Negative for worsening agitation or other fatigue All other system neg per pt    Objective:   Physical Exam BP 118/64   Pulse (!) 119   Temp 98 F (36.7 C) (Oral)   Ht 5' 6.75" (1.695 m)   Wt 229 lb (103.9 kg)   SpO2 96%   BMI 36.14 kg/m  VS noted, mild ill Constitutional: Pt appears in NAD HENT: Head: NCAT.  Right Ear: External ear normal.  Left Ear: External ear normal.  Eyes: . Pupils are equal, round, and reactive to light. left Conjunctivae weepy and marked erythema conjunctiva, and EOM are normal Bilat tm's with mild erythema.  Max sinus areas mild tender.  Pharynx with mild erythema, no exudate  Nose: without d/c or deformity Neck: Neck supple. Gross normal ROM Cardiovascular: Normal rate and regular rhythm.   Pulmonary/Chest: Effort normal and breath sounds without rales or wheezing.  Neurological: Pt is alert. At baseline orientation, motor grossly intact Skin: Skin is warm. No rashes, other new lesions, no LE edema Psychiatric: Pt behavior is normal without agitation  No other exam findings  Rapid flu - neg    Assessment & Plan:

## 2019-01-17 NOTE — Assessment & Plan Note (Signed)
Mild to mod, for antibx course,  to f/u any worsening symptoms or concerns 

## 2019-01-17 NOTE — Assessment & Plan Note (Signed)
Ok to finish next 2 days of cipro for UTI, improved,  to f/u any worsening symptoms or concerns

## 2019-01-21 ENCOUNTER — Telehealth: Payer: Self-pay

## 2019-01-21 MED ORDER — NEOMYCIN-POLYMYXIN-HC 3.5-10000-1 OP SUSP: 4.0000 [drp] | Freq: Four times a day (QID) | OPHTHALMIC | 0 refills | Status: DC

## 2019-01-21 NOTE — Telephone Encounter (Signed)
Pt is calling back in to make provider aware that per the pharmacy they are unable to provide medication that was sent in. Pt would like to know if provider could send in a different medication to the [id number] [place], [place]?

## 2019-01-21 NOTE — Addendum Note (Signed)
Addended by: Biagio Borg on: 01/21/2019 12:59 PM   Modules accepted: Orders

## 2019-01-21 NOTE — Telephone Encounter (Signed)
Dr. Jenny Reichmann please advise.     Copied from [place] [id number]. Topic: General - Other >> Jan 21, 2019  8:52 AM [name] wrote:  Pt said the gel is not working for her and is asking if drops can be called in erythromycin ophthalmic ointment   Ph[facility]

## 2019-01-21 NOTE — Telephone Encounter (Signed)
[place] for antibx eye drops - done erx

## 2019-01-21 NOTE — Telephone Encounter (Signed)
Pt has been informed.

## 2019-01-22 ENCOUNTER — Telehealth: Payer: Self-pay | Admitting: Internal Medicine

## 2019-01-22 NOTE — Telephone Encounter (Signed)
Pt has been informed.

## 2019-01-22 NOTE — Telephone Encounter (Signed)
Since we have 2 antibiotic prescriptions now rejected, please ask pt to ask at pharmacy for a suggestion of eye antibiotic (that she is not allergic to) but is covered by her insurance

## 2019-01-22 NOTE — Telephone Encounter (Signed)
Copied from [place] [id number]. Topic: Quick Communication - See Telephone Encounter >> Jan 22, 2019 10:22 AM [name] wrote: CRM for notification. See Telephone encounter for: 01/22/19.Note     Pt is calling back in to make provider aware that per the pharmacy they are unable to provide medication that was sent in. Pt would like to know if provider could send in a different medication to the [id number] - [place], [place]?  Pt is calling back from yesterday to followup that Jacky Kindle nor [facility] have this med and needs something different called in to the [facility]/ neomycin-polymyxin-hydrocortisone (CORTISPORIN) [id number] ophthalmic suspension

## 2019-01-22 NOTE — Telephone Encounter (Signed)
Pt stated that the pharmacy gave her 3 eye drops that they have in stock. Would like to see if Dr. Jenny Reichmann can send them in to see if insurance will cover one. Please advise. Polytrim Tobradex Cibroflexin  [id number] [address], [place] (Phone) [phone] (Fax)

## 2019-01-23 MED ORDER — POLYMYXIN B-TRIMETHOPRIM 10000-0.1 UNIT/ML-% OP SOLN: 2.0000 [drp] | Freq: Four times a day (QID) | OPHTHALMIC | 0 refills | Status: AC

## 2019-01-23 NOTE — Addendum Note (Signed)
Addended by: Biagio Borg on: 01/23/2019 10:25 AM   Modules accepted: Orders

## 2019-01-23 NOTE — Telephone Encounter (Signed)
Patient calling and states that she has checked with her insurance company and was told that they do cover those eye drops. Patient would like to know if the Polytrim could be sent to the pharmacy? Please advise.

## 2019-01-23 NOTE — Telephone Encounter (Signed)
Pt has been informed and will call insurance company and find out what it covered.

## 2019-01-23 NOTE — Telephone Encounter (Signed)
Ok since the history has changed and she now states by documented history by staff that  these are not only in stock, but the polytrim is actually covered by her insurance   If this is not true, however, pt will need ROV to determine if still needs the antibx

## 2019-01-23 NOTE — Telephone Encounter (Signed)
I decline.  Please ask the pharmacy to figure out which is covered by her insurance first.  I will not play email tag on trying medications until one works.

## 2019-04-17 ENCOUNTER — Telehealth: Payer: Self-pay | Admitting: Certified Nurse Midwife

## 2019-04-17 NOTE — Telephone Encounter (Signed)
Left message regarding upcoming appointment has been canceled and needs to be rescheduled. °

## 2019-05-06 DIAGNOSIS — D485 Neoplasm of uncertain behavior of skin: Secondary | ICD-10-CM | POA: Diagnosis not present

## 2019-05-06 DIAGNOSIS — R208 Other disturbances of skin sensation: Secondary | ICD-10-CM | POA: Diagnosis not present

## 2019-05-06 DIAGNOSIS — D1801 Hemangioma of skin and subcutaneous tissue: Secondary | ICD-10-CM | POA: Diagnosis not present

## 2019-05-06 DIAGNOSIS — D225 Melanocytic nevi of trunk: Secondary | ICD-10-CM | POA: Diagnosis not present

## 2019-05-06 DIAGNOSIS — L821 Other seborrheic keratosis: Secondary | ICD-10-CM | POA: Diagnosis not present

## 2019-05-06 DIAGNOSIS — L814 Other melanin hyperpigmentation: Secondary | ICD-10-CM | POA: Diagnosis not present

## 2019-05-13 DIAGNOSIS — Z20828 Contact with and (suspected) exposure to other viral communicable diseases: Secondary | ICD-10-CM | POA: Diagnosis not present

## 2019-07-08 ENCOUNTER — Other Ambulatory Visit (HOSPITAL_COMMUNITY)
Admission: RE | Admit: 2019-07-08 | Discharge: 2019-07-08 | Disposition: A | Discharge status: Home / Self Care | Payer: Medicare Other | Source: Ambulatory Visit | Attending: Certified Nurse Midwife | Admitting: Certified Nurse Midwife

## 2019-07-08 ENCOUNTER — Ambulatory Visit (INDEPENDENT_AMBULATORY_CARE_PROVIDER_SITE_OTHER): Payer: Medicare Other | Admitting: Certified Nurse Midwife

## 2019-07-08 ENCOUNTER — Other Ambulatory Visit: Payer: Self-pay

## 2019-07-08 ENCOUNTER — Encounter: Payer: Self-pay | Admitting: Certified Nurse Midwife

## 2019-07-08 VITALS — BP 114/70 | HR 68 | Temp 97.3°F | Resp 16 | Ht 66.75 in | Wt 232.0 lb

## 2019-07-08 DIAGNOSIS — Z01419 Encounter for gynecological examination (general) (routine) without abnormal findings: Secondary | ICD-10-CM

## 2019-07-08 DIAGNOSIS — Z124 Encounter for screening for malignant neoplasm of cervix: Secondary | ICD-10-CM | POA: Diagnosis not present

## 2019-07-08 DIAGNOSIS — Z8541 Personal history of malignant neoplasm of cervix uteri: Secondary | ICD-10-CM

## 2019-07-08 NOTE — Patient Instructions (Signed)

## 2019-07-08 NOTE — Progress Notes (Signed)
68 y.o. G57P1001 Married  Caucasian Fe here for annual exam. Menopausal still occasional hot flashes, no night sweats. Some vaginal dryness, no issues. Has Yuvafem for dryness prn twice weekly. Denies any vaginal bleeding. Has Rx. Mammogram scheduled. Sees PCP yearly for aex and medication management as needed.  No LMP recorded. Patient has had a hysterectomy.          Sexually active: Yes.    The current method of family planning is status post hysterectomy.    Exercising: No.  exercise Smoker:  no  Review of Systems  Constitutional: Negative.   HENT: Negative.   Eyes: Negative.   Respiratory: Negative.   Cardiovascular: Negative.   Gastrointestinal: Negative.   Genitourinary: Negative.   Musculoskeletal: Negative.   Skin: Negative.   Neurological: Negative.   Endo/Heme/Allergies: Negative.   Psychiatric/Behavioral: Negative.     Health Maintenance: Pap:06-21-16 neg, 07-05-17 neg, 07-06-18 neg HPV HR neg History of Abnormal Pap: yes LGSIL 12/2015 MMG:  05-16-18 category b density birads 2:neg Self Breast exams: yes Colonoscopy:  2018 f/u 25yr BMD:   2019 2 years TDaP:  2015 Shingles: not done Pneumonia: had done Hep C and HIV: hep c neg 2017, HIV neg 2019 Labs: if needed   reports that she has never smoked. She has never used smokeless tobacco. She reports that she does not drink alcohol or use drugs.  Past Medical History:  Diagnosis Date  . Abnormal Pap smear of vagina 06/17/15   LGSIL  . Allergic rhinitis, cause unspecified   . Arthritis   . ARTHRITIS, KNEE 02/10/2009   Qualifier: Diagnosis of  By: SNiel HummerMD, WLorinda Creed  . BRCA negative 03/25/13  . Cervical cancer ([place] 1984  . Cervical disc disease 02/11/2012  . Chronic LBP   . Degenerative arthritis of hip 02/11/2012  . Diverticulosis 02/11/2012  . GERD 08/10/2007   Qualifier: Diagnosis of  By: WSherlynn Stalls CMA, [place]   . GERD (gastroesophageal reflux disease)   . GERD (gastroesophageal reflux disease)   .  HEMORRHOIDS-INTERNAL 07/21/2010   Qualifier: Diagnosis of  By: GChester HolsteinNP, PNevin Bloodgood   . Hiatal hernia   . Hyperlipidemia 02/13/2012  . Mitral valve prolapse   . OAB (overactive bladder)   . Obesity   . OVERACTIVE BLADDER 08/10/2007   Qualifier: Diagnosis of  By: WSherlynn Stalls CMA, [place]   . Pneumonia   . PONV (postoperative nausea and vomiting)   . Recurrent UTI   . SUI (stress urinary incontinence, female)   . VAGINITIS, ATROPHIC 11/11/2008   Qualifier: Diagnosis of  By: SNiel HummerMD, WLorinda Creed    Past Surgical History:  Procedure Laterality Date  . BUNIONECTOMY  2007  . CERVICAL DISCECTOMY  2002  . CHOLECYSTECTOMY N/A 08/13/2013   Procedure: LAPAROSCOPIC CHOLECYSTECTOMY WITH INTRAOPERATIVE CHOLANGIOGRAM;  Surgeon: DShann Medal MD;  [facility]  Service: General;  Laterality: N/A;  . CLOSED REDUCTION METATARSAL FRACTURE     rt side 06-03-18, left side 08-19-17  . COLONOSCOPY    . conization of cervix  1982   with D&C  . CYSTOCELE REPAIR  2003   rectocele repair  . EYE SURGERY Bilateral    lasik  . ORIF ANKLE FRACTURE Right 08/20/2017   Procedure: OPEN REDUCTION INTERNAL FIXATION (ORIF) ANKLE FRACTURE;  Surgeon: DMeredith Pel MD;  [facility];  Service: Orthopedics;  Laterality: Right;  . SHOULDER ARTHROSCOPY  2004   frozen shoulder  . TONSILLECTOMY    .  VAGINAL HYSTERECTOMY  1986    Current Outpatient Medications  Medication Sig Dispense Refill  . Estradiol (YUVAFEM) 10 MCG TABS vaginal tablet Place 1 tablet (10 mcg total) vaginally 2 (two) times a week. 8 tablet 12  . MULTIPLE VITAMIN tablet Take 1 tablet by mouth daily.    . nabumetone (RELAFEN) 750 MG tablet TAKE 1 TABLET BY MOUTH TWICE A DAY AS NEEDED FOR ARTHRITIS 180 tablet 3  . pantoprazole (PROTONIX) 40 MG tablet Take 1 tablet (40 mg total) by mouth daily. 90 tablet 3   No current facility-administered medications for this visit.     Family History  Problem Relation Age of Onset  . Breast cancer  Mother        dx in her last 68's  . Heart disease Sister   . Breast cancer Sister        dx in her late 38's  . Diabetes Brother   . Heart disease Brother        triple bypass  . Diabetes Maternal Aunt   . Diabetes Maternal Uncle   . Cancer Maternal Grandmother        uterine  . Colon cancer Neg Hx   . Rectal cancer Neg Hx   . Esophageal cancer Neg Hx     ROS:  Pertinent items are noted in HPI.  Otherwise, a comprehensive ROS was negative.  Exam:   BP 114/70   Pulse 68   Temp (!) 97.3 F (36.3 C) (Skin)   Resp 16   Ht 5' 6.75" (1.695 m)   Wt 232 lb (105.2 kg)   BMI 36.61 kg/m  Height: 5' 6.75" (169.5 cm) Ht Readings from Last 3 Encounters:  07/08/19 5' 6.75" (1.695 m)  01/17/19 5' 6.75" (1.695 m)  01/09/19 5' 6.75" (1.695 m)    General appearance: alert, cooperative and appears stated age Head: Normocephalic, without obvious abnormality, atraumatic Neck: no adenopathy, supple, symmetrical, trachea midline and thyroid normal to inspection and palpation Lungs: clear to auscultation bilaterally Breasts: normal appearance, no masses or tenderness, No nipple retraction or dimpling, No nipple discharge or bleeding, No axillary or supraclavicular adenopathy Heart: regular rate and rhythm Abdomen: soft, non-tender; no masses,  no organomegaly Extremities: extremities normal, atraumatic, no cyanosis or edema Skin: Skin color, texture, turgor normal. No rashes or lesions Lymph nodes: Cervical, supraclavicular, and axillary nodes normal. No abnormal inguinal nodes palpated Neurologic: Grossly normal   Pelvic: External genitalia:  no lesions              Urethra:  normal appearing urethra with no masses, tenderness or lesions              Bartholin's and Skene's: normal                 Vagina: normal appearing vagina with normal color and discharge, no lesions              Cervix: absent              Pap taken: Yes.   Bimanual Exam:  Uterus:  uterus absent               Adnexa: normal adnexa and no mass, fullness, tenderness               Rectovaginal: Confirms               Anus:  normal sphincter tone, no lesions  Chaperone present: yes  A:  Well Woman with  normal exam  Menopausal no HRT s/p TVH due to cervical cancer  Vaginal dryness with Yuvafem use prn, no more than twice weekly.   PCP management of medications, labs and aex..   Mammogram due  P:   Reviewed health and wellness pertinent to exam  Aware to advise if vaginal bleeding or dryness change. Will update Rx for Yuvafem once mammogram in.  Continue follow up with PCP as indicated.  Has appointment scheduled.  Pap smear: yes  counseled on breast self exam, mammography screening, feminine hygiene, adequate intake of calcium and vitamin D, diet and exercise, Kegel's exercises  return annually or prn  An After Visit Summary was printed and given to the patient.

## 2019-07-10 ENCOUNTER — Encounter: Payer: Self-pay | Admitting: Obstetrics & Gynecology

## 2019-07-10 DIAGNOSIS — Z1231 Encounter for screening mammogram for malignant neoplasm of breast: Secondary | ICD-10-CM | POA: Diagnosis not present

## 2019-07-10 DIAGNOSIS — Z803 Family history of malignant neoplasm of breast: Secondary | ICD-10-CM | POA: Diagnosis not present

## 2019-07-10 LAB — CYTOLOGY - PAP: Diagnosis: NEGATIVE

## 2019-07-11 ENCOUNTER — Ambulatory Visit: Payer: Federal, State, Local not specified - PPO | Admitting: Certified Nurse Midwife

## 2019-07-26 ENCOUNTER — Ambulatory Visit (INDEPENDENT_AMBULATORY_CARE_PROVIDER_SITE_OTHER): Payer: Medicare Other | Admitting: Obstetrics & Gynecology

## 2019-07-26 ENCOUNTER — Other Ambulatory Visit: Payer: Self-pay

## 2019-07-26 ENCOUNTER — Encounter: Payer: Self-pay | Admitting: Obstetrics & Gynecology

## 2019-07-26 ENCOUNTER — Telehealth: Payer: Self-pay | Admitting: Certified Nurse Midwife

## 2019-07-26 VITALS — BP 130/80 | HR 68 | Temp 97.2°F | Ht 66.75 in | Wt 234.0 lb

## 2019-07-26 DIAGNOSIS — N3943 Post-void dribbling: Secondary | ICD-10-CM

## 2019-07-26 DIAGNOSIS — R3915 Urgency of urination: Secondary | ICD-10-CM | POA: Diagnosis not present

## 2019-07-26 LAB — POCT URINALYSIS DIPSTICK
Bilirubin, UA: NEGATIVE
Glucose, UA: NEGATIVE
Ketones, UA: NEGATIVE
Nitrite, UA: NEGATIVE
Protein, UA: NEGATIVE
Urobilinogen, UA: 0.2 E.U./dL
pH, UA: 5 (ref 5.0–8.0)

## 2019-07-26 MED ORDER — SULFAMETHOXAZOLE-TRIMETHOPRIM 800-160 MG PO TABS: 1.0000 | ORAL_TABLET | Freq: Two times a day (BID) | ORAL | 0 refills | Status: DC

## 2019-07-26 MED ORDER — TRIMETHOPRIM 100 MG PO TABS: 100.0000 mg | ORAL_TABLET | Freq: Two times a day (BID) | ORAL | 4 refills | Status: DC

## 2019-07-26 NOTE — Telephone Encounter (Signed)
Call to patient. Reports UTI symptoms for 2 weeks. Uurinary frequency and urgency increased this week. Strong odor to urine and difficult to void. Denies back pain or fever. Office visit today at 345.  Routing to Dr Sabra Heck. Encounter closed.

## 2019-07-26 NOTE — Telephone Encounter (Signed)
Patient believes she has a UTI. Calling to see if we can call in a prescription for her. Patient aware she will probably need to be seen. Nothing available this afternoon.

## 2019-07-26 NOTE — Progress Notes (Signed)
GYNECOLOGY  VISIT  CC:   urinary urgency and frequency x 2 weeks  HPI: 68 y.o. G64P1001 Married White or Caucasian female here for UTI symptoms.  Symptoms started about two weeks with urinary frequency.  This has worsened with pelvic pressure.  Urine has odor as well.  Has not seen any blood in her urine.  Denies pelvic pain.  Denies back pain.  Denies fever.  She does have some "tingling" sensation.  Denies vaginal bleeding or vaginal discharge.    She used to take macrobid for UTI prophylaxis.  Decided to stop this when a friend had an unusual side effect with macrobid.  This is the third UTI that she's had treated in the past year.  Options for prophylaxis reviewed.  She would like to try trimethoprim.  GYNECOLOGIC HISTORY: No LMP recorded. Patient has had a hysterectomy. Contraception: PMP Menopausal hormone therapy: Yuvafem  Patient Active Problem List   Diagnosis Date Noted  . Acute upper respiratory infection 01/17/2019  . Acute conjunctivitis of left eye 01/17/2019  . Urinary frequency 01/09/2019  . Cough 05/03/2018  . Dysuria 03/28/2018  . Trimalleolar fracture 08/20/2017  . Ankle fracture 08/20/2017  . OSA on CPAP 07/30/2017  . Right hip pain 07/28/2017  . Abnormal TSH 07/21/2015  . URI (upper respiratory infection) 04/17/2015  . Greater trochanteric bursitis of right hip 08/04/2014  . Localized osteoarthrosis, lower leg 08/04/2014  . Biliary colic 59/56/3875  . Cholelithiasis 08/07/2013  . Left otitis media 12/07/2012  . Hyperlipidemia 02/13/2012  . Impaired glucose tolerance 02/13/2012  . Preventative health care 02/11/2012  . Diverticulosis 02/11/2012  . Degenerative arthritis of hip 02/11/2012  . Cervical disc disease 02/11/2012  . Cervical cancer ([place])   . Allergic rhinitis   . Chronic low back pain   . Obesity   . Blood in stool 08/31/2010  . HEMORRHOIDS-INTERNAL 07/21/2010  . CONSTIPATION 07/19/2010  . ARTHRITIS, KNEE 02/10/2009  . VAGINITIS, ATROPHIC  11/11/2008  . EXOGENOUS OBESITY 07/29/2008  . GERD 08/10/2007  . OVERACTIVE BLADDER 08/10/2007    Past Medical History:  Diagnosis Date  . Abnormal Pap smear of vagina 06/17/15   LGSIL  . Allergic rhinitis, cause unspecified   . Arthritis   . ARTHRITIS, KNEE 02/10/2009   Qualifier: Diagnosis of  By: Niel Hummer MD, Lorinda Creed   . BRCA negative 03/25/13  . Cervical cancer ([place]) 1984  . Cervical disc disease 02/11/2012  . Chronic LBP   . Degenerative arthritis of hip 02/11/2012  . Diverticulosis 02/11/2012  . GERD 08/10/2007   Qualifier: Diagnosis of  By: Sherlynn Stalls, CMA, [place]    . GERD (gastroesophageal reflux disease)   . GERD (gastroesophageal reflux disease)   . HEMORRHOIDS-INTERNAL 07/21/2010   Qualifier: Diagnosis of  By: Chester Holstein NP, Nevin Bloodgood    . Hiatal hernia   . Hyperlipidemia 02/13/2012  . Mitral valve prolapse   . OAB (overactive bladder)   . Obesity   . OVERACTIVE BLADDER 08/10/2007   Qualifier: Diagnosis of  By: Sherlynn Stalls, CMA, [place]    . Pneumonia   . PONV (postoperative nausea and vomiting)   . Recurrent UTI   . SUI (stress urinary incontinence, female)   . VAGINITIS, ATROPHIC 11/11/2008   Qualifier: Diagnosis of  By: Niel Hummer MD, Lorinda Creed     Past Surgical History:  Procedure Laterality Date  . BUNIONECTOMY  2007  . CERVICAL DISCECTOMY  2002  . CHOLECYSTECTOMY N/A 08/13/2013   Procedure: LAPAROSCOPIC CHOLECYSTECTOMY WITH INTRAOPERATIVE CHOLANGIOGRAM;  Surgeon: Shanon Brow  Bary Leriche, MD;  Location: [facility];  Service: General;  Laterality: N/A;  . CLOSED REDUCTION METATARSAL FRACTURE     rt side 06-03-18, left side 08-19-17  . COLONOSCOPY    . conization of cervix  1982   with D&C  . CYSTOCELE REPAIR  2003   rectocele repair  . EYE SURGERY Bilateral    lasik  . ORIF ANKLE FRACTURE Right 08/20/2017   Procedure: OPEN REDUCTION INTERNAL FIXATION (ORIF) ANKLE FRACTURE;  Surgeon: Meredith Pel, MD;  [facility];  Service: Orthopedics;  Laterality: Right;  . SHOULDER  ARTHROSCOPY  2004   frozen shoulder  . TONSILLECTOMY    . VAGINAL HYSTERECTOMY  1986    MEDS:   Current Outpatient Medications on File Prior to Visit  Medication Sig Dispense Refill  . Estradiol (YUVAFEM) 10 MCG TABS vaginal tablet Place 1 tablet (10 mcg total) vaginally 2 (two) times a week. 8 tablet 12  . MULTIPLE VITAMIN tablet Take 1 tablet by mouth daily.    . nabumetone (RELAFEN) 750 MG tablet TAKE 1 TABLET BY MOUTH TWICE A DAY AS NEEDED FOR ARTHRITIS 180 tablet 3  . pantoprazole (PROTONIX) 40 MG tablet Take 1 tablet (40 mg total) by mouth daily. 90 tablet 3   No current facility-administered medications on file prior to visit.     ALLERGIES: Amoxicillin and Penicillins  Family History  Problem Relation Age of Onset  . Breast cancer Mother        dx in her last 64's  . Heart disease Sister   . Breast cancer Sister        dx in her late 7's  . Diabetes Brother   . Heart disease Brother        triple bypass  . Diabetes Maternal Aunt   . Diabetes Maternal Uncle   . Cancer Maternal Grandmother        uterine  . Thyroid cancer Other        2 cousins  . Breast cancer Cousin     SH:  Married, non smoker  Review of Systems  Genitourinary: Positive for frequency and urgency.  All other systems reviewed and are negative.   PHYSICAL EXAMINATION:    BP 130/80   Pulse 68   Temp (!) 97.2 F (36.2 C) (Temporal)   Ht 5' 6.75" (1.695 m)   Wt 234 lb (106.1 kg)   BMI 36.92 kg/m     General appearance: alert, cooperative and appears stated age Flank:  No CVA tenderness Abdomen: soft, non-tender; bowel sounds normal; no masses,  no organomegaly Lymph:  no inguinal LAD noted  Pelvic: External genitalia:  no lesions              Urethra:  normal appearing urethra with no masses, tenderness or lesions              Bartholins and Skenes: normal                 Vagina: normal appearing vagina with normal color and discharge, no lesions              Cervix: absent               Bimanual Exam:  Uterus:  uterus absent              Adnexa: no mass, fullness, tenderness   Urine cath u/a obtained today as pt could not void  Chaperone was present for exam.  Assessment: Cystitis  Plan: Bactrim DS BID x 3 days Trimethoprim 15m po q day for prophylaxis.

## 2019-07-27 LAB — URINALYSIS, MICROSCOPIC ONLY
Casts: NONE SEEN /lpf
WBC, UA: >30 /hpf — AB (ref 0–5)

## 2019-07-30 LAB — URINE CULTURE

## 2019-08-01 ENCOUNTER — Other Ambulatory Visit: Payer: Self-pay | Admitting: *Deleted

## 2019-08-01 DIAGNOSIS — N390 Urinary tract infection, site not specified: Secondary | ICD-10-CM

## 2019-08-02 ENCOUNTER — Ambulatory Visit (INDEPENDENT_AMBULATORY_CARE_PROVIDER_SITE_OTHER): Payer: Medicare Other | Admitting: Internal Medicine

## 2019-08-02 ENCOUNTER — Other Ambulatory Visit: Payer: Self-pay

## 2019-08-02 ENCOUNTER — Other Ambulatory Visit (INDEPENDENT_AMBULATORY_CARE_PROVIDER_SITE_OTHER): Payer: Medicare Other

## 2019-08-02 ENCOUNTER — Encounter: Payer: Self-pay | Admitting: Internal Medicine

## 2019-08-02 ENCOUNTER — Telehealth: Payer: Self-pay | Admitting: Internal Medicine

## 2019-08-02 VITALS — BP 130/88 | HR 64 | Temp 98.1°F | Ht 66.75 in | Wt 235.0 lb

## 2019-08-02 DIAGNOSIS — E611 Iron deficiency: Secondary | ICD-10-CM

## 2019-08-02 DIAGNOSIS — E785 Hyperlipidemia, unspecified: Secondary | ICD-10-CM | POA: Diagnosis not present

## 2019-08-02 DIAGNOSIS — N390 Urinary tract infection, site not specified: Secondary | ICD-10-CM

## 2019-08-02 DIAGNOSIS — E559 Vitamin D deficiency, unspecified: Secondary | ICD-10-CM

## 2019-08-02 DIAGNOSIS — E538 Deficiency of other specified B group vitamins: Secondary | ICD-10-CM

## 2019-08-02 DIAGNOSIS — G8929 Other chronic pain: Secondary | ICD-10-CM

## 2019-08-02 DIAGNOSIS — M545 Other chronic pain: Secondary | ICD-10-CM

## 2019-08-02 DIAGNOSIS — R7302 Impaired glucose tolerance (oral): Secondary | ICD-10-CM

## 2019-08-02 DIAGNOSIS — Z23 Encounter for immunization: Secondary | ICD-10-CM | POA: Diagnosis not present

## 2019-08-02 LAB — CBC WITH DIFFERENTIAL/PLATELET
Basophils Absolute: 0 10*3/uL (ref 0.0–0.1)
Basophils Relative: 0.6 % (ref 0.0–3.0)
Eosinophils Absolute: 0.2 10*3/uL (ref 0.0–0.7)
Eosinophils Relative: 2.8 % (ref 0.0–5.0)
HCT: 40.4 % (ref 36.0–46.0)
Hemoglobin: 13.5 g/dL (ref 12.0–15.0)
Lymphocytes Relative: 48.9 % — ABNORMAL HIGH (ref 12.0–46.0)
Lymphs Abs: 3.9 10*3/uL (ref 0.7–4.0)
MCHC: 33.5 g/dL (ref 30.0–36.0)
MCV: 88.1 fl (ref 78.0–100.0)
Monocytes Absolute: 0.4 10*3/uL (ref 0.1–1.0)
Monocytes Relative: 5.1 % (ref 3.0–12.0)
Neutro Abs: 3.4 10*3/uL (ref 1.4–7.7)
Neutrophils Relative %: 42.6 % — ABNORMAL LOW (ref 43.0–77.0)
Platelets: 250 10*3/uL (ref 150.0–400.0)
RBC: 4.59 Mil/uL (ref 3.87–5.11)
RDW: 13.3 % (ref 11.5–15.5)
WBC: 8 10*3/uL (ref 4.0–10.5)

## 2019-08-02 LAB — LIPID PANEL
Cholesterol: 176 mg/dL (ref 0–200)
HDL: 39.6 mg/dL (ref >39.00)
LDL Cholesterol: 112 mg/dL — ABNORMAL HIGH (ref 0–99)
NonHDL: 135.96
Total CHOL/HDL Ratio: 4
Triglycerides: 120 mg/dL (ref 0.0–149.0)
VLDL: 24 mg/dL (ref 0.0–40.0)

## 2019-08-02 LAB — BASIC METABOLIC PANEL
BUN: 14 mg/dL (ref 6–23)
CO2: 25 mEq/L (ref 19–32)
Calcium: 9.5 mg/dL (ref 8.4–10.5)
Chloride: 105 mEq/L (ref 96–112)
Creatinine, Ser: 0.88 mg/dL (ref 0.40–1.20)
GFR: 64.33 mL/min (ref >60.00)
Glucose, Bld: 90 mg/dL (ref 70–99)
Potassium: 4.2 mEq/L (ref 3.5–5.1)
Sodium: 140 mEq/L (ref 135–145)

## 2019-08-02 LAB — HEPATIC FUNCTION PANEL
ALT: 24 U/L (ref 0–35)
AST: 23 U/L (ref 0–37)
Albumin: 4.1 g/dL (ref 3.5–5.2)
Alkaline Phosphatase: 90 U/L (ref 39–117)
Bilirubin, Direct: 0 mg/dL (ref 0.0–0.3)
Total Bilirubin: 0.7 mg/dL (ref 0.2–1.2)
Total Protein: 6.9 g/dL (ref 6.0–8.3)

## 2019-08-02 LAB — TSH: TSH: 2.22 u[IU]/mL (ref 0.35–4.50)

## 2019-08-02 LAB — IBC PANEL
Iron: 63 ug/dL (ref 42–145)
Saturation Ratios: 22.2 % (ref 20.0–50.0)
Transferrin: 203 mg/dL — ABNORMAL LOW (ref 212.0–360.0)

## 2019-08-02 LAB — HEMOGLOBIN A1C: Hgb A1c MFr Bld: 6 % (ref 4.6–6.5)

## 2019-08-02 LAB — VITAMIN D 25 HYDROXY (VIT D DEFICIENCY, FRACTURES): VITD: 17.87 ng/mL — ABNORMAL LOW (ref 30.00–100.00)

## 2019-08-02 LAB — VITAMIN B12: Vitamin B-12: 328 pg/mL (ref 211–911)

## 2019-08-02 MED ORDER — VITAMIN D (ERGOCALCIFEROL) 1.25 MG (50000 UNIT) PO CAPS: 50000.0000 [IU] | ORAL_CAPSULE | ORAL | 0 refills | Status: DC

## 2019-08-02 MED ORDER — PANTOPRAZOLE SODIUM 40 MG PO TBEC: 40.0000 mg | DELAYED_RELEASE_TABLET | Freq: Every day | ORAL | 3 refills | Status: DC

## 2019-08-02 MED ORDER — NABUMETONE 750 MG PO TABS: ORAL_TABLET | ORAL | 3 refills | Status: DC

## 2019-08-02 NOTE — Assessment & Plan Note (Addendum)
stable overall by history and exam, recent data reviewed with pt, and pt to continue medical treatment as before,  to f/u any worsening symptoms or concerns, also for cardiac CT scoring

## 2019-08-02 NOTE — Patient Instructions (Addendum)
You had the flu shot today  Please return in 2 wks for a nurse visit for the Prevnar 13   We'll plan on the Pneumovax in 1 year.  Please continue all other medications as before, and refills have been done if requested.  Please have the pharmacy call with any other refills you may need.  Please continue your efforts at being more active, low cholesterol diet, and weight control.  You are otherwise up to date with prevention measures today.  Please keep your appointments with your specialists as you may have planned  You will be contacted regarding the referral for: Cardiac CT score  Please go to the LAB in the Basement (turn left off the elevator) for the tests to be done today  You will be contacted by phone if any changes need to be made immediately.  Otherwise, you will receive a letter about your results with an explanation, but please check with MyChart first.  Please remember to sign up for MyChart if you have not done so, as this will be important to you in the future with finding out test results, communicating by private email, and scheduling acute appointments online when needed.  Please return in 1 year for your yearly visit, or sooner if needed

## 2019-08-02 NOTE — Assessment & Plan Note (Signed)
stable overall by history and exam, recent data reviewed with pt, and pt to continue medical treatment as before,  to f/u any worsening symptoms or concerns  

## 2019-08-02 NOTE — Progress Notes (Signed)
Subjective:    Patient ID: [name], female    DOB: [birth year], 68 y.o.   MRN: [id number]  HPI  Here for yearly f/u;  Overall doing ok;  Pt denies Chest pain, worsening SOB, DOE, wheezing, orthopnea, PND, worsening LE edema, palpitations, dizziness or syncope.  Pt denies neurological change such as new headache, facial or extremity weakness.  Pt denies polydipsia, polyuria, or low sugar symptoms. Pt states overall good compliance with treatment and medications, good tolerability, and has been trying to follow appropriate diet.  Pt denies worsening depressive symptoms, suicidal ideation or panic. No fever, night sweats, wt loss, loss of appetite, or other constitutional symptoms.  Pt states good ability with ADL's, has low fall risk, home safety reviewed and adequate, no other significant changes in hearing or vision, and not active with exercise.  Working part time to end 2020 then retire.   No new complaints Past Medical History:  Diagnosis Date  . Abnormal Pap smear of vagina 06/17/15   LGSIL  . Allergic rhinitis, cause unspecified   . Arthritis   . ARTHRITIS, KNEE 02/10/2009   Qualifier: Diagnosis of  By: Niel Hummer MD, Lorinda Creed   . BRCA negative 03/25/13  . Cervical cancer ([place]) 1984  . Cervical disc disease 02/11/2012  . Chronic LBP   . Degenerative arthritis of hip 02/11/2012  . Diverticulosis 02/11/2012  . GERD 08/10/2007   Qualifier: Diagnosis of  By: Sherlynn Stalls, CMA, [place]    . GERD (gastroesophageal reflux disease)   . GERD (gastroesophageal reflux disease)   . HEMORRHOIDS-INTERNAL 07/21/2010   Qualifier: Diagnosis of  By: Chester Holstein NP, Nevin Bloodgood    . Hiatal hernia   . Hyperlipidemia 02/13/2012  . Mitral valve prolapse   . OAB (overactive bladder)   . Obesity   . OVERACTIVE BLADDER 08/10/2007   Qualifier: Diagnosis of  By: Sherlynn Stalls, CMA, [place]    . Pneumonia   . PONV (postoperative nausea and vomiting)   . Recurrent UTI   . SUI (stress urinary incontinence, female)   . VAGINITIS, ATROPHIC  11/11/2008   Qualifier: Diagnosis of  By: Niel Hummer MD, Lorinda Creed    Past Surgical History:  Procedure Laterality Date  . BUNIONECTOMY  2007  . CERVICAL DISCECTOMY  2002  . CHOLECYSTECTOMY N/A 08/13/2013   Procedure: LAPAROSCOPIC CHOLECYSTECTOMY WITH INTRAOPERATIVE CHOLANGIOGRAM;  Surgeon: Shann Medal, MD;  Location: [facility];  Service: General;  Laterality: N/A;  . CLOSED REDUCTION METATARSAL FRACTURE     rt side 06-03-18, left side 08-19-17  . COLONOSCOPY    . conization of cervix  1982   with D&C  . CYSTOCELE REPAIR  2003   rectocele repair  . EYE SURGERY Bilateral    lasik  . ORIF ANKLE FRACTURE Right 08/20/2017   Procedure: OPEN REDUCTION INTERNAL FIXATION (ORIF) ANKLE FRACTURE;  Surgeon: Meredith Pel, MD;  [facility];  Service: Orthopedics;  Laterality: Right;  . SHOULDER ARTHROSCOPY  2004   frozen shoulder  . TONSILLECTOMY    . VAGINAL HYSTERECTOMY  1986    reports that she has never smoked. She has never used smokeless tobacco. She reports that she does not drink alcohol or use drugs. family history includes Breast cancer in her cousin, mother, and sister; Cancer in her maternal grandmother; Diabetes in her brother, maternal aunt, and maternal uncle; Heart disease in her brother and sister; Thyroid cancer in an other family member. Allergies  Allergen Reactions  . Amoxicillin     REACTION:  unspecified  . Penicillins Other (See Comments)    Unknown reaction   Current Outpatient Medications on File Prior to Visit  Medication Sig Dispense Refill  . Estradiol (YUVAFEM) 10 MCG TABS vaginal tablet Place 1 tablet (10 mcg total) vaginally 2 (two) times a week. 8 tablet 12  . MULTIPLE VITAMIN tablet Take 1 tablet by mouth daily.    [name] Kitchen trimethoprim (TRIMPEX) 100 MG tablet Take 1 tablet (100 mg total) by mouth 2 (two) times daily. Hold rx for pt.  She will call when wants it filled. 30 tablet 4   No current facility-administered medications on file prior to visit.     Review of Systems Constitutional: Negative for other unusual diaphoresis, sweats, appetite or weight changes HENT: Negative for other worsening hearing loss, ear pain, facial swelling, mouth sores or neck stiffness.   Eyes: Negative for other worsening pain, redness or other visual disturbance.  Respiratory: Negative for other stridor or swelling Cardiovascular: Negative for other palpitations or other chest pain  Gastrointestinal: Negative for worsening diarrhea or loose stools, blood in stool, distention or other pain Genitourinary: Negative for hematuria, flank pain or other change in urine volume.  Musculoskeletal: Negative for myalgias or other joint swelling.  Skin: Negative for other color change, or other wound or worsening drainage.  Neurological: Negative for other syncope or numbness. Hematological: Negative for other adenopathy or swelling Psychiatric/Behavioral: Negative for hallucinations, other worsening agitation, SI, self-injury, or new decreased concentration All otherwise neg per pt     Objective:   Physical Exam BP 130/88 (BP Location: Left Arm, Patient Position: Sitting, Cuff Size: Large)   Pulse 64   Temp 98.1 F (36.7 C) (Oral)   Ht 5' 6.75" (1.695 m)   Wt 235 lb (106.6 kg)   SpO2 95%   BMI 37.08 kg/m  VS noted,  Constitutional: Pt is oriented to person, place, and time. Appears well-developed and well-nourished, in no significant distress and comfortable Head: Normocephalic and atraumatic  Eyes: Conjunctivae and EOM are normal. Pupils are equal, round, and reactive to light Right Ear: External ear normal without discharge Left Ear: External ear normal without discharge Nose: Nose without discharge or deformity Mouth/Throat: Oropharynx is without other ulcerations and moist  Neck: Normal range of motion. Neck supple. No JVD present. No tracheal deviation present or significant neck LA or mass Cardiovascular: Normal rate, regular rhythm, normal heart sounds  and intact distal pulses.   Pulmonary/Chest: WOB normal and breath sounds without rales or wheezing  Abdominal: Soft. Bowel sounds are normal. NT. No HSM  Musculoskeletal: Normal range of motion. Exhibits no edema Lymphadenopathy: Has no other cervical adenopathy.  Neurological: Pt is alert and oriented to person, place, and time. Pt has normal reflexes. No cranial nerve deficit. Motor grossly intact, Gait intact Skin: Skin is warm and dry. No rash noted or new ulcerations Psychiatric:  Has normal mood and affect. Behavior is normal without agitation All otherwise neg per pt Lab Results  Component Value Date   WBC 8.0 08/02/2019   HGB 13.5 08/02/2019   HCT 40.4 08/02/2019   PLT 250.0 08/02/2019   GLUCOSE 90 08/02/2019   CHOL 176 08/02/2019   TRIG 120.0 08/02/2019   HDL 39.60 08/02/2019   LDLDIRECT 135.1 05/08/2012   LDLCALC 112 (H) 08/02/2019   ALT 24 08/02/2019   AST 23 08/02/2019   NA 140 08/02/2019   K 4.2 08/02/2019   CL 105 08/02/2019   CREATININE 0.88 08/02/2019   BUN 14 08/02/2019  CO2 25 08/02/2019   TSH 2.22 08/02/2019   HGBA1C 6.0 08/02/2019          Assessment & Plan:

## 2019-08-02 NOTE — Telephone Encounter (Signed)
none

## 2019-08-05 ENCOUNTER — Other Ambulatory Visit: Payer: Self-pay

## 2019-08-05 ENCOUNTER — Telehealth: Payer: Self-pay

## 2019-08-05 ENCOUNTER — Ambulatory Visit: Payer: Federal, State, Local not specified - PPO

## 2019-08-05 VITALS — BP 118/80 | HR 78 | Temp 97.1°F | Ht 66.75 in | Wt 233.0 lb

## 2019-08-05 DIAGNOSIS — R319 Hematuria, unspecified: Secondary | ICD-10-CM | POA: Diagnosis not present

## 2019-08-05 DIAGNOSIS — N390 Urinary tract infection, site not specified: Secondary | ICD-10-CM | POA: Diagnosis not present

## 2019-08-05 NOTE — Telephone Encounter (Signed)
-----   Message from Biagio Borg, MD sent at 08/02/2019 12:55 PM EDT ----- Left message on MyChart, pt to cont same tx except  The test results show that your current treatment is OK, as the tests are stable, except the Vitamin D level is low.  Please take Vitamin D 50000 units weekly for 12 weeks, then plan to change to OTC Vitamin D3 at 2000 units per day, indefinitely.Redmond Baseman to please inform pt, I will do rx

## 2019-08-05 NOTE — Progress Notes (Signed)
Pt is here for [facility] for UTI. She has completed the antibiotic and she does feel better but has noticed some lower left back pain in the last 2 days. Urine culture and micro sent to lab.  Encounter closed and routed to provider for review.

## 2019-08-05 NOTE — Telephone Encounter (Signed)
Called pt, LVM.   CRM created.  

## 2019-08-06 LAB — URINALYSIS, MICROSCOPIC ONLY: Casts: NONE SEEN /lpf

## 2019-08-07 LAB — URINE CULTURE

## 2019-08-13 ENCOUNTER — Telehealth: Payer: Self-pay | Admitting: Certified Nurse Midwife

## 2019-08-13 NOTE — Telephone Encounter (Signed)
Call to patient. Patient states she saw her urine culture results on MyChart wondering if the "mixed urogenital flora" meant she had a UTI. Patient states she is still having some urinary urgency. No burning, but experiencing irritation, likely from daily pad use. Reviewed with Debbi. Patient advised to use coconut oil, and disposable underwear to cut down on contamination risk from pad use. Patient agreeable. OV scheduled for 08-14-2019 at 1130 with Debbi. Patient agreeable to date and time of appointment.   Routing to provider and will close encounter.

## 2019-08-13 NOTE — Telephone Encounter (Signed)
Patient has some questions about her recent results.

## 2019-08-13 NOTE — Progress Notes (Signed)
67 y.o. Married Caucasian female [id number] here with complaint of urinary leakage and frequency over the past few days. Had urine culture which showed 25-50 urogenital bacteria. Patient had been using Macrobid daily and stopped. Was given trimethoprim Rx and did not pick up.  Patient denies  pain with urination.Just feels irritated there. Patient denies fever, chills, nausea. No new personal products. Uses cream base soap. Does not treat for vaginal or vulva dryness. Not sexually active.   Menopausal. Patient feels she is drinking adequate water intake. No excessive caffeine. No other issues today. Has seen Urology before and will make appointment if feels necessary. Wears pads for leakage.  Review of Systems  Constitutional: Negative.   HENT: Negative.   Eyes: Negative.   Respiratory: Negative.   Cardiovascular: Negative.   Gastrointestinal: Negative.   Genitourinary:       Back pain  Musculoskeletal: Negative.   Skin: Negative.   Neurological: Negative.   Endo/Heme/Allergies: Negative.   Psychiatric/Behavioral: Negative.     O: Healthy female WDWN Affect: Normal, orientation x 3 Skin : warm and dry CVAT: negative bilateral Abdomen: negative for suprapubic tenderness, soft, non tender  Pelvic exam: External genital area: normal, no lesions,atrophic appearance Bladder,Urethra non tender, Urethral meatus: atrophic appearance with dryness and tenderness to touch Vagina: scant vaginal moisture, short vaginal canal due to surgery. Atrophic  Appearance, slightly tender Cervix: absent Uterus:absent Adnexa: non tender, no fullness or masses  poct urine-rbc tr, wbc tr  A: Stress incontinence with urinary urgency, pad use all the time Atrophic vaginitis with dryness and irritation S/P vaginal hysterectomy   P: Reviewed findings and shown area of concern to patient with mirror. Discussed dryness and atrophy effect on leakage and irritation. Suggest coconut oil to protect urethral meatus and  vaginal opening from pad irritation. Suggested incontinence panty instead, not Depends. Start on Cranberry capsule supplement daily. Lab :Urine culture, urine micro If no change and no infection feel she should Urology again. Discussed trial of trimethoprim to see if this will also help. She will consider.  Rv prn [id number] micro, culture Reviewed warning signs and symptoms of UTI and need to advise if occurring. Encouraged to limit soda, tea, and coffee and be sure to increase water intake.   RV prn

## 2019-08-14 ENCOUNTER — Other Ambulatory Visit: Payer: Self-pay

## 2019-08-14 ENCOUNTER — Ambulatory Visit (INDEPENDENT_AMBULATORY_CARE_PROVIDER_SITE_OTHER): Payer: Medicare Other | Admitting: Certified Nurse Midwife

## 2019-08-14 ENCOUNTER — Encounter: Payer: Self-pay | Admitting: Certified Nurse Midwife

## 2019-08-14 VITALS — BP 120/70 | HR 70 | Temp 97.2°F | Resp 16 | Wt 233.0 lb

## 2019-08-14 DIAGNOSIS — N952 Postmenopausal atrophic vaginitis: Secondary | ICD-10-CM | POA: Diagnosis not present

## 2019-08-14 DIAGNOSIS — R350 Frequency of micturition: Secondary | ICD-10-CM | POA: Diagnosis not present

## 2019-08-14 DIAGNOSIS — N390 Urinary tract infection, site not specified: Secondary | ICD-10-CM | POA: Diagnosis not present

## 2019-08-14 DIAGNOSIS — R319 Hematuria, unspecified: Secondary | ICD-10-CM | POA: Diagnosis not present

## 2019-08-14 LAB — POCT URINALYSIS DIPSTICK
Bilirubin, UA: NEGATIVE
Glucose, UA: NEGATIVE
Ketones, UA: NEGATIVE
Nitrite, UA: NEGATIVE
Protein, UA: NEGATIVE
Urobilinogen, UA: NEGATIVE E.U./dL — AB
pH, UA: 5 (ref 5.0–8.0)

## 2019-08-14 NOTE — Patient Instructions (Signed)
Atrophic Vaginitis Atrophic vaginitis is a condition in which the tissues that line the vagina become dry and thin. This condition occurs in women who have stopped having their period. It is caused by a drop in a female hormone (estrogen). This hormone helps:  To keep the vagina moist.  To make a clear fluid. This clear fluid helps: ? To make the vagina ready for sex. ? To protect the vagina from infection. If the lining of the vagina is dry and thin, it may cause irritation, burning, or itchiness. It may also:  Make sex painful.  Make an exam of your vagina painful.  Cause bleeding.  Make you lose interest in sex.  Cause a burning feeling when you pee (urinate).  Cause a brown or yellow fluid to come from your vagina. Some women do not have symptoms. Follow these instructions at home: Medicines  Take over-the-counter and prescription medicines only as told by your doctor.  Do not use herbs or other medicines unless your doctor says it is okay.  Use medicines for for dryness. These include: ? Oils to make the vagina soft. ? Creams. ? Moisturizers. General instructions  Do not douche.  Do not use products that can make your vagina dry. These include: ? Scented sprays. ? Scented tampons. ? Scented soaps.  Sex can help increase blood flow and soften the tissue in the vagina. If it hurts to have sex: ? Tell your partner. ? Use products to make sex more comfortable. Use these only as told by your doctor. Contact a doctor if you:  Have discharge from the vagina that is different than usual.  Have a bad smell coming from your vagina.  Have new symptoms.  Do not get better.  Get worse. Summary  Atrophic vaginitis is a condition in which the lining of the vagina becomes dry and thin.  This condition affects women who have stopped having their periods.  Treatment may include using products that help make the vagina soft.  Call a doctor if do not get better with  treatment. This information is not intended to replace advice given to you by your health care provider. Make sure you discuss any questions you have with your health care provider. Document Released: 04/11/2008 Document Revised: 11/06/2017 Document Reviewed: 11/06/2017 Elsevier Patient Education  2020 [employer]. Urinary Frequency, Adult Urinary frequency means urinating more often than usual. You may urinate every 1-2 hours even though you drink a normal amount of fluid and do not have a bladder infection or condition. Although you urinate more often than normal, the total amount of urine produced in a day is normal. With urinary frequency, you may have an urgent need to urinate often. The stress and anxiety of needing to find a bathroom quickly can make this urge worse. This condition may go away on its own or you may need treatment at home. Home treatment may include bladder training, exercises, taking medicines, or making changes to your diet. Follow these instructions at home: Bladder health   Keep a bladder diary if told by your health care provider. Keep track of: ? What you eat and drink. ? How often you urinate. ? How much you urinate.  Follow a bladder training program if told by your health care provider. This may include: ? Learning to delay going to the bathroom. ? Double urinating (voiding). This helps if you are not completely emptying your bladder. ? Scheduled voiding.  Do Kegel exercises as told by your health care  provider. Kegel exercises strengthen the muscles that help control urination, which may help the condition. Eating and drinking  If told by your health care provider, make diet changes, such as: ? Avoiding caffeine. ? Drinking fewer fluids, especially alcohol. ? Not drinking in the evening. ? Avoiding foods or drinks that may irritate the bladder. These include coffee, tea, soda, artificial sweeteners, citrus, tomato-based foods, and chocolate. ? Eating  foods that help prevent or ease constipation. Constipation can make this condition worse. Your health care provider may recommend that you:  Drink enough fluid to keep your urine pale yellow.  Take over-the-counter or prescription medicines.  Eat foods that are high in fiber, such as beans, whole grains, and fresh fruits and vegetables.  Limit foods that are high in fat and processed sugars, such as fried or sweet foods. General instructions  Take over-the-counter and prescription medicines only as told by your health care provider.  Keep all follow-up visits as told by your health care provider. This is important. Contact a health care provider if:  You start urinating more often.  You feel pain or irritation when you urinate.  You notice blood in your urine.  Your urine looks cloudy.  You develop a fever.  You begin vomiting. Get help right away if:  You are unable to urinate. Summary  Urinary frequency means urinating more often than usual. With urinary frequency, you may urinate every 1-2 hours even though you drink a normal amount of fluid and do not have a bladder infection or other bladder condition.  Your health care provider may recommend that you keep a bladder diary, follow a bladder training program, or make dietary changes.  If told by your health care provider, do Kegel exercises to strengthen the muscles that help control urination.  Take over-the-counter and prescription medicines only as told by your health care provider.  Contact a health care provider if your symptoms do not improve or get worse. This information is not intended to replace advice given to you by your health care provider. Make sure you discuss any questions you have with your health care provider. Document Released: 08/20/2009 Document Revised: 05/03/2018 Document Reviewed: 05/03/2018 Elsevier Patient Education  2020 [employer].

## 2019-08-15 LAB — URINALYSIS, MICROSCOPIC ONLY: Casts: NONE SEEN /lpf

## 2019-08-16 ENCOUNTER — Telehealth: Payer: Self-pay | Admitting: *Deleted

## 2019-08-16 DIAGNOSIS — R3915 Urgency of urination: Secondary | ICD-10-CM

## 2019-08-16 DIAGNOSIS — R82998 Other abnormal findings in urine: Secondary | ICD-10-CM

## 2019-08-16 DIAGNOSIS — R350 Frequency of micturition: Secondary | ICD-10-CM

## 2019-08-16 LAB — URINE CULTURE

## 2019-08-16 NOTE — Telephone Encounter (Signed)
Spoke with patient, advised per Melvia Heaps, CNM. Patient agreeable to referral to urology, has been to [facility] in the past, request Dr. Diona Fanti or Dr. Karsten Ro. Order placed. Advised our office referral coordinator will f/u with appt details once scheduled. Patient request to schedule on a Mon or Wed. Patient verbalizes understanding and is agreeable.   Routing to provider for final review. Patient is agreeable to disposition. Will close encounter.   Cc: Magdalene Patricia

## 2019-08-16 NOTE — Telephone Encounter (Signed)
-----   Message from Regina Eck, CNM sent at 08/16/2019  7:53 AM EDT ----- Notify patient her urine culture showed less than 10,000 colonies of urogenital flora which antibiotic treatment is not needed.  The urine micro however indicated crystals noted which can indicate possible stone formation. WBC count was low and no RBC's seen. She needs to increase her water intake to make sure emptying well and feel she needs to see Urology regarding. Please assist as needed with Urology scheduling.

## 2019-08-16 NOTE — Telephone Encounter (Signed)
Notes recorded by Burnice Logan, RN on 08/16/2019 at 2:38 PM EDT  Left message to call Sharee Pimple, RN at [facility].

## 2019-08-16 NOTE — Telephone Encounter (Signed)
Patient returning call.

## 2019-08-19 ENCOUNTER — Ambulatory Visit (INDEPENDENT_AMBULATORY_CARE_PROVIDER_SITE_OTHER): Payer: Medicare Other | Admitting: *Deleted

## 2019-08-19 ENCOUNTER — Other Ambulatory Visit: Payer: Self-pay

## 2019-08-19 DIAGNOSIS — Z23 Encounter for immunization: Secondary | ICD-10-CM | POA: Diagnosis not present

## 2019-08-20 ENCOUNTER — Encounter: Payer: Self-pay | Admitting: Internal Medicine

## 2019-09-25 ENCOUNTER — Other Ambulatory Visit: Payer: Self-pay

## 2019-09-25 ENCOUNTER — Ambulatory Visit (INDEPENDENT_AMBULATORY_CARE_PROVIDER_SITE_OTHER)
Admission: RE | Admit: 2019-09-25 | Discharge: 2019-09-25 | Disposition: A | Discharge status: Home / Self Care | Payer: Self-pay | Source: Ambulatory Visit | Attending: Internal Medicine | Admitting: Internal Medicine

## 2019-09-25 DIAGNOSIS — E785 Hyperlipidemia, unspecified: Secondary | ICD-10-CM

## 2019-09-25 DIAGNOSIS — R7302 Impaired glucose tolerance (oral): Secondary | ICD-10-CM

## 2019-10-07 ENCOUNTER — Ambulatory Visit (INDEPENDENT_AMBULATORY_CARE_PROVIDER_SITE_OTHER): Payer: Medicare Other | Admitting: Internal Medicine

## 2019-10-07 ENCOUNTER — Encounter: Payer: Self-pay | Admitting: Internal Medicine

## 2019-10-07 ENCOUNTER — Other Ambulatory Visit: Payer: Self-pay

## 2019-10-07 VITALS — BP 130/80 | HR 65 | Temp 97.8°F | Ht 66.75 in | Wt 238.0 lb

## 2019-10-07 DIAGNOSIS — M5412 Radiculopathy, cervical region: Secondary | ICD-10-CM | POA: Insufficient documentation

## 2019-10-07 DIAGNOSIS — M5442 Lumbago with sciatica, left side: Secondary | ICD-10-CM | POA: Insufficient documentation

## 2019-10-07 DIAGNOSIS — R7302 Impaired glucose tolerance (oral): Secondary | ICD-10-CM | POA: Diagnosis not present

## 2019-10-07 DIAGNOSIS — M5441 Lumbago with sciatica, right side: Secondary | ICD-10-CM | POA: Diagnosis not present

## 2019-10-07 MED ORDER — PREDNISONE 10 MG PO TABS: ORAL_TABLET | ORAL | 0 refills | Status: DC

## 2019-10-07 MED ORDER — TIZANIDINE HCL 2 MG PO TABS: 2.0000 mg | ORAL_TABLET | Freq: Four times a day (QID) | ORAL | 1 refills | Status: DC | PRN

## 2019-10-07 MED ORDER — METHYLPREDNISOLONE ACETATE 80 MG/ML IJ SUSP
80.0000 mg | Freq: Once | INTRAMUSCULAR | Status: AC
  Administered 2019-10-07: 80 mg via INTRAMUSCULAR

## 2019-10-07 MED ORDER — HYDROCODONE-ACETAMINOPHEN 5-325 MG PO TABS: 1.0000 | ORAL_TABLET | Freq: Four times a day (QID) | ORAL | 0 refills | Status: DC | PRN

## 2019-10-07 NOTE — Assessment & Plan Note (Signed)
With acute flare, for depomedrol IM 80, predpac asd, hydrocodone prn, and flexeril prn, consider MRI if not improving

## 2019-10-07 NOTE — Progress Notes (Signed)
Subjective:    Patient ID: [name], female    DOB: Jan 25, 1951, 68 y.o.   MRN: [id number]  HPI  Here with left arm/neck symptoms recurred as well as acute onset LBP located across the lower back with radiation to the buttocks and legs not quite to the knees bilateral, overall severe, constant, not better with tramadol, ongoing x 3 days after bending at the waist to pick something up, nothing else makes better or worse. Also with recurring LUE pain and numbness similar to that occurred several yrs ago prior to c spine surgury, and told her symptoms may recur at the next cervical level. Pt denies chest pain, increased sob or doe, wheezing, orthopnea, PND, increased LE swelling, palpitations, dizziness or syncope.   Pt denies polydipsia, polyuria Past Medical History:  Diagnosis Date  . Abnormal Pap smear of vagina 06/17/15   LGSIL  . Allergic rhinitis, cause unspecified   . Arthritis   . ARTHRITIS, KNEE 02/10/2009   Qualifier: Diagnosis of  By: Niel Hummer MD, Lorinda Creed   . BRCA negative 03/25/13  . Cervical cancer ([place]) 1984  . Cervical disc disease 02/11/2012  . Chronic LBP   . Degenerative arthritis of hip 02/11/2012  . Diverticulosis 02/11/2012  . GERD 08/10/2007   Qualifier: Diagnosis of  By: Sherlynn Stalls, CMA, [place]    . GERD (gastroesophageal reflux disease)   . GERD (gastroesophageal reflux disease)   . HEMORRHOIDS-INTERNAL 07/21/2010   Qualifier: Diagnosis of  By: Chester Holstein NP, Nevin Bloodgood    . Hiatal hernia   . Hyperlipidemia 02/13/2012  . Mitral valve prolapse   . OAB (overactive bladder)   . Obesity   . OVERACTIVE BLADDER 08/10/2007   Qualifier: Diagnosis of  By: Sherlynn Stalls, CMA, [place]    . Pneumonia   . PONV (postoperative nausea and vomiting)   . Recurrent UTI   . SUI (stress urinary incontinence, female)   . VAGINITIS, ATROPHIC 11/11/2008   Qualifier: Diagnosis of  By: Niel Hummer MD, Lorinda Creed    Past Surgical History:  Procedure Laterality Date  . BUNIONECTOMY  2007  . CERVICAL DISCECTOMY   2002  . CHOLECYSTECTOMY N/A 08/13/2013   Procedure: LAPAROSCOPIC CHOLECYSTECTOMY WITH INTRAOPERATIVE CHOLANGIOGRAM;  Surgeon: Shann Medal, MD;  Location: [facility];  Service: General;  Laterality: N/A;  . CLOSED REDUCTION METATARSAL FRACTURE     rt side 06-03-18, left side 08-19-17  . COLONOSCOPY    . conization of cervix  1982   with D&C  . CYSTOCELE REPAIR  2003   rectocele repair  . EYE SURGERY Bilateral    lasik  . ORIF ANKLE FRACTURE Right 08/20/2017   Procedure: OPEN REDUCTION INTERNAL FIXATION (ORIF) ANKLE FRACTURE;  Surgeon: Meredith Pel, MD;  Location: [facility];  Service: Orthopedics;  Laterality: Right;  . SHOULDER ARTHROSCOPY  2004   frozen shoulder  . TONSILLECTOMY    . VAGINAL HYSTERECTOMY  1986    reports that she has never smoked. She has never used smokeless tobacco. She reports that she does not drink alcohol or use drugs. family history includes Breast cancer in her cousin, mother, and sister; Cancer in her maternal grandmother; Diabetes in her brother, maternal aunt, and maternal uncle; Heart disease in her brother and sister; Thyroid cancer in an other family member. Allergies  Allergen Reactions  . Amoxicillin     REACTION: unspecified  . Penicillins Other (See Comments)    Unknown reaction   Current Outpatient Medications on File Prior to Visit  Medication Sig Dispense Refill  . Estradiol (YUVAFEM) 10 MCG TABS vaginal tablet Place 1 tablet (10 mcg total) vaginally 2 (two) times a week. 8 tablet 12  . MULTIPLE VITAMIN tablet Take 1 tablet by mouth daily.    . nabumetone (RELAFEN) 750 MG tablet TAKE 1 TABLET BY MOUTH TWICE A DAY AS NEEDED FOR ARTHRITIS 180 tablet 3  . pantoprazole (PROTONIX) 40 MG tablet Take 1 tablet (40 mg total) by mouth daily. 90 tablet 3  . trimethoprim (TRIMPEX) 100 MG tablet Take 1 tablet (100 mg total) by mouth 2 (two) times daily. Hold rx for pt.  She will call when wants it filled. 30 tablet 4  . Vitamin D, Ergocalciferol, (DRISDOL)  1.25 MG (50000 UT) CAPS capsule Take 1 capsule (50,000 Units total) by mouth every 7 (seven) days. 12 capsule 0   No current facility-administered medications on file prior to visit.     Review of Systems  Constitutional: Negative for other unusual diaphoresis or sweats HENT: Negative for ear discharge or swelling Eyes: Negative for other worsening visual disturbances Respiratory: Negative for stridor or other swelling  Gastrointestinal: Negative for worsening distension or other blood Genitourinary: Negative for retention or other urinary change Musculoskeletal: Negative for other MSK pain or swelling Skin: Negative for color change or other new lesions Neurological: Negative for worsening tremors and other numbness  Psychiatric/Behavioral: Negative for worsening agitation or other fatigue All otherwise neg per pt     Objective:   Physical Exam BP 130/80 (BP Location: Left Arm, Patient Position: Sitting, Cuff Size: Large)   Pulse 65   Temp 97.8 F (36.6 C) (Oral)   Ht 5' 6.75" (1.695 m)   Wt 238 lb (108 kg)   SpO2 96%   BMI 37.56 kg/m  VS noted,  Constitutional: Pt appears in NAD HENT: Head: NCAT.  Right Ear: External ear normal.  Left Ear: External ear normal.  Eyes: . Pupils are equal, round, and reactive to light. Conjunctivae and EOM are normal Nose: without d/c or deformity Neck: Neck supple. Gross normal ROM Cardiovascular: Normal rate and regular rhythm.   Pulmonary/Chest: Effort normal and breath sounds without rales or wheezing.  Abd:  Soft, NT, ND, + BS, no organomegaly Spine nontender in the midline and tender over bilat sciatic notch Neurological: Pt is alert. At baseline orientation, motor 5/5 intact, but decreased sensation to the distal LUE Skin: Skin is warm. No rashes, other new lesions, no LE edema Psychiatric: Pt behavior is normal without agitation  All otherwise neg per pt     Assessment & Plan:

## 2019-10-07 NOTE — Assessment & Plan Note (Deleted)
With acute flare, for depomedrol IM 80, predpac asd, hydrocodone prn, and flexeril prn, consider MRI if not improving

## 2019-10-07 NOTE — Assessment & Plan Note (Signed)
stable overall by history and exam, recent data reviewed with pt, and pt to continue medical treatment as before,  to f/u any worsening symptoms or concerns  

## 2019-10-07 NOTE — Assessment & Plan Note (Signed)
With recurring symptoms and abnormal exam, for MRi c spine, pain control, and refer NS Dr Luiz Ochoa per pt request

## 2019-10-07 NOTE — Patient Instructions (Signed)
You had the steroid shot today  Please take all new medication as prescribed  - the prednisone, hydrocodone as needed for pain, and muscle relaxer as needed  You will be contacted regarding the referral for: MRI and Dr Luiz Ochoa  Please continue all other medications as before, and refills have been done if requested.  Please have the pharmacy call with any other refills you may need.  Please continue your efforts at being more active, low cholesterol diet, and weight control  Please keep your appointments with your specialists as you may have planned

## 2019-10-14 DIAGNOSIS — Z20828 Contact with and (suspected) exposure to other viral communicable diseases: Secondary | ICD-10-CM | POA: Diagnosis not present

## 2019-10-21 ENCOUNTER — Encounter: Payer: Self-pay | Admitting: Certified Nurse Midwife

## 2019-10-21 ENCOUNTER — Other Ambulatory Visit: Payer: Self-pay

## 2019-10-21 ENCOUNTER — Ambulatory Visit (INDEPENDENT_AMBULATORY_CARE_PROVIDER_SITE_OTHER): Payer: Medicare Other | Admitting: Certified Nurse Midwife

## 2019-10-21 VITALS — BP 120/80 | HR 70 | Temp 97.2°F | Resp 16 | Wt 237.0 lb

## 2019-10-21 DIAGNOSIS — N390 Urinary tract infection, site not specified: Secondary | ICD-10-CM | POA: Diagnosis not present

## 2019-10-21 LAB — POCT URINALYSIS DIPSTICK
Bilirubin, UA: NEGATIVE
Blood, UA: NEGATIVE
Glucose, UA: NEGATIVE
Ketones, UA: NEGATIVE
Nitrite, UA: NEGATIVE
Protein, UA: NEGATIVE
Urobilinogen, UA: NEGATIVE E.U./dL — AB
pH, UA: 5 (ref 5.0–8.0)

## 2019-10-21 NOTE — Progress Notes (Signed)
68 y.o. Married Caucasian female G1P1001 here with complaint of UTI, with onset  on 5-6 days ago. . Patient complaining of urinary frequency/urgency/ and no pain with urination. Patient denies fever, chills, nausea or slight on left back pain. No new personal products. Patient feels not related to sexual activity. Denies any vaginal symptoms. Menopausal with vaginal dryness but no change. Patient not drinking adequate water intake. Patient also having some stress incontinence and wearing pad daily.  No other health issues. Plans to retire at end of month!  Review of Systems  Constitutional: Negative.   HENT: Negative.   Eyes: Negative.   Respiratory: Negative.   Cardiovascular: Negative.   Gastrointestinal: Negative.   Genitourinary: Positive for frequency.       Pressure & back tender on left side  Musculoskeletal: Negative.   Skin: Negative.   Neurological: Negative.   Endo/Heme/Allergies: Negative.   Psychiatric/Behavioral: Negative.     O: Healthy female WDWN Affect: Normal, orientation x 3 Skin : warm and dry CVAT: positive on left only Abdomen: positive for suprapubic tenderness  Pelvic exam: External genital area: normal, no lesions Bladder,Urethra tender, Urethral meatus: slightly tender, red Vagina: normal vaginal discharge, normal appearance  Wet prep not taken Cervix: normal, non tender Uterus:normal,non tender Adnexa: normal non tender, no fullness or masses  poct urine-wbc 1+ A: UTI Normal pelvic exam   P: Reviewed findings of UTI and need for treatment. Rx: Trimethoprim 100 mg bid x 3 days( patient has Rx for this has not filled, will pick up). [id number]  culture Reviewed warning signs and symptoms of UTI and need to advise if occurring. Encouraged to limit soda, tea, and coffee and be sure to increase water intake. Discussed applying coconut oil or [place] around urinary meatus for protection with pad to see if this will decrease UTI occurrence. Questions  addressed.  Rv prn   RV prn

## 2019-10-21 NOTE — Patient Instructions (Signed)
Urinary Tract Infection, Adult A urinary tract infection (UTI) is an infection of any part of the urinary tract. The urinary tract includes:  The kidneys.  The ureters.  The bladder.  The urethra. These organs make, store, and get rid of pee (urine) in the body. What are the causes? This is caused by germs (bacteria) in your genital area. These germs grow and cause swelling (inflammation) of your urinary tract. What increases the risk? You are more likely to develop this condition if:  You have a small, thin tube (catheter) to drain pee.  You cannot control when you pee or poop (incontinence).  You are female, and: ? You use these methods to prevent pregnancy: ? A medicine that kills sperm (spermicide). ? A device that blocks sperm (diaphragm). ? You have low levels of a female hormone (estrogen). ? You are pregnant.  You have genes that add to your risk.  You are sexually active.  You take antibiotic medicines.  You have trouble peeing because of: ? A prostate that is bigger than normal, if you are female. ? A blockage in the part of your body that drains pee from the bladder (urethra). ? A kidney stone. ? A nerve condition that affects your bladder (neurogenic bladder). ? Not getting enough to drink. ? Not peeing often enough.  You have other conditions, such as: ? Diabetes. ? A weak disease-fighting system (immune system). ? Sickle cell disease. ? Gout. ? Injury of the spine. What are the signs or symptoms? Symptoms of this condition include:  Needing to pee right away (urgently).  Peeing often.  Peeing small amounts often.  Pain or burning when peeing.  Blood in the pee.  Pee that smells bad or not like normal.  Trouble peeing.  Pee that is cloudy.  Fluid coming from the vagina, if you are female.  Pain in the belly or lower back. Other symptoms include:  Throwing up (vomiting).  No urge to eat.  Feeling mixed up (confused).  Being tired  and grouchy (irritable).  A fever.  Watery poop (diarrhea). How is this treated? This condition may be treated with:  Antibiotic medicine.  Other medicines.  Drinking enough water. Follow these instructions at home:  Medicines  Take over-the-counter and prescription medicines only as told by your doctor.  If you were prescribed an antibiotic medicine, take it as told by your doctor. Do not stop taking it even if you start to feel better. General instructions  Make sure you: ? Pee until your bladder is empty. ? Do not hold pee for a long time. ? Empty your bladder after sex. ? Wipe from front to back after pooping if you are a female. Use each tissue one time when you wipe.  Drink enough fluid to keep your pee pale yellow.  Keep all follow-up visits as told by your doctor. This is important. Contact a doctor if:  You do not get better after 1-2 days.  Your symptoms go away and then come back. Get help right away if:  You have very bad back pain.  You have very bad pain in your lower belly.  You have a fever.  You are sick to your stomach (nauseous).  You are throwing up. Summary  A urinary tract infection (UTI) is an infection of any part of the urinary tract.  This condition is caused by germs in your genital area.  There are many risk factors for a UTI. These include having a small, thin   tube to drain pee and not being able to control when you pee or poop.  Treatment includes antibiotic medicines for germs.  Drink enough fluid to keep your pee pale yellow. This information is not intended to replace advice given to you by your health care provider. Make sure you discuss any questions you have with your health care provider. Document Released: 04/11/2008 Document Revised: 10/11/2018 Document Reviewed: 05/03/2018 Elsevier Patient Education  2020 [employer]  

## 2019-10-22 ENCOUNTER — Encounter: Payer: Self-pay | Admitting: Internal Medicine

## 2019-10-23 ENCOUNTER — Telehealth: Payer: Self-pay | Admitting: Certified Nurse Midwife

## 2019-10-23 LAB — URINE CULTURE

## 2019-10-23 NOTE — Telephone Encounter (Signed)
Call placed to follow up on referral. 

## 2019-10-24 ENCOUNTER — Ambulatory Visit: Payer: Medicare Other | Attending: Internal Medicine

## 2019-10-24 DIAGNOSIS — Z20822 Contact with and (suspected) exposure to covid-19: Secondary | ICD-10-CM

## 2019-10-25 ENCOUNTER — Other Ambulatory Visit: Payer: Self-pay | Admitting: Internal Medicine

## 2019-10-25 LAB — NOVEL CORONAVIRUS, NAA: SARS-CoV-2, NAA: NOT DETECTED

## 2019-11-21 ENCOUNTER — Telehealth: Payer: Self-pay

## 2019-11-21 NOTE — Telephone Encounter (Signed)
[place] spoke with Gay Filler.  She said that no one from [facility] should be calling us regarding this. She did say that it looked like patient was waiting on an order but could not give me any information due patient confidentiality. I called and talked with patient and advised her that we were getting these calls and that this needed to come from her PCP. She will reach out to them regarding this issue.

## 2019-11-21 NOTE — Telephone Encounter (Signed)
[facility] called and left a VM concerning a Rx for a C-PAP for patient.  [phone].  Cb# [phone].  Please advise.  Thank you.

## 2019-11-21 NOTE — Telephone Encounter (Signed)
Please advise 

## 2019-11-21 NOTE — Telephone Encounter (Signed)
Please call her and see if she needs to call her PCP.

## 2019-11-21 NOTE — Telephone Encounter (Signed)
Spoke with someone from [facility]. She stated Dr. Marlou Sa was the one who originally ordered C-PAP. Rx for supplies is needing to be renewed.

## 2019-12-06 ENCOUNTER — Ambulatory Visit: Payer: Medicare Other

## 2019-12-13 ENCOUNTER — Ambulatory Visit: Payer: Medicare Other | Attending: Internal Medicine

## 2019-12-13 DIAGNOSIS — Z23 Encounter for immunization: Secondary | ICD-10-CM | POA: Insufficient documentation

## 2019-12-13 NOTE — Progress Notes (Signed)
   Covid-19 Vaccination Clinic  Name:  [name]    MRN: [id number] DOB: 02-Feb-1951  12/13/2019  [name] was observed post Covid-19 immunization for 15 minutes without incidence. She was provided with Vaccine Information Sheet and instruction to access the V-Safe system.   [name] was instructed to call 911 with any severe reactions post vaccine: [name] Kitchen Difficulty breathing  . Swelling of your face and throat  . A fast heartbeat  . A bad rash all over your body  . Dizziness and weakness    Immunizations Administered    Name Date Dose VIS Date Route   Pfizer COVID-19 Vaccine 12/13/2019  1:16 PM 0.3 mL 10/18/2019 Intramuscular   Manufacturer: [place]   Lot: [id number]   [place]: [id number]

## 2019-12-23 ENCOUNTER — Ambulatory Visit: Payer: Medicare Other

## 2020-01-07 ENCOUNTER — Ambulatory Visit: Payer: Medicare Other | Attending: Internal Medicine

## 2020-01-07 DIAGNOSIS — Z23 Encounter for immunization: Secondary | ICD-10-CM | POA: Insufficient documentation

## 2020-01-07 NOTE — Progress Notes (Signed)
   Covid-19 Vaccination Clinic  Name:  [name]    MRN: [id number] DOB: 02-Apr-1951  01/07/2020  [name] was observed post Covid-19 immunization for 15 minutes without incident. She was provided with Vaccine Information Sheet and instruction to access the V-Safe system.   [name] was instructed to call 911 with any severe reactions post vaccine: [name] Kitchen Difficulty breathing  . Swelling of face and throat  . A fast heartbeat  . A bad rash all over body  . Dizziness and weakness   Immunizations Administered    Name Date Dose VIS Date Route   Pfizer COVID-19 Vaccine 01/07/2020 12:53 PM 0.3 mL 10/18/2019 Intramuscular   Manufacturer: [place]   Lot: [id number]   [place]: [id number]

## 2020-01-28 ENCOUNTER — Encounter: Payer: Self-pay | Admitting: Certified Nurse Midwife

## 2020-02-11 ENCOUNTER — Telehealth: Payer: Self-pay

## 2020-02-11 MED ORDER — PANTOPRAZOLE SODIUM 40 MG PO TBEC: 40.0000 mg | DELAYED_RELEASE_TABLET | Freq: Every day | ORAL | 1 refills | Status: DC

## 2020-02-11 NOTE — Telephone Encounter (Signed)
Reviewed chart pt is up-to-date sent refills to pof.[name] Kitchen/LMB

## 2020-02-11 NOTE — Telephone Encounter (Signed)
1.Medication Requested:pantoprazole (PROTONIX) 40 MG tablet  2. Pharmacy ([place]):[id number] - [place], [address]  3. On Med List: Yes   4. Last Visit with PCP: 11.30.2020  5. Next visit date with PCP: no appt is made at this time    Agent: Please be advised that RX refills may take up to 3 business days. We ask that you follow-up with your pharmacy.

## 2020-02-20 NOTE — Telephone Encounter (Signed)
Key: [id number]  PA started.

## 2020-02-20 NOTE — Telephone Encounter (Signed)
Stefannie can you help?

## 2020-02-20 NOTE — Telephone Encounter (Signed)
Patient called and stated insurance said they need a PA on the this medication.  She is completely out. [phone] - #2 to completed this request.

## 2020-04-07 ENCOUNTER — Other Ambulatory Visit: Payer: Self-pay | Admitting: Obstetrics & Gynecology

## 2020-04-07 NOTE — Telephone Encounter (Signed)
Medication refill request: Trimethoprim  Last AEX:  07/08/19 DL Next AEX: none scheduled Last MMG (if hormonal medication request): 07/10/19 BIRADS 1 negative/density b Refill authorized: Please advise on refill  Will call patient to schedule.

## 2020-04-08 NOTE — Telephone Encounter (Signed)
Patient scheduled AEX 07/09/20 at 10:30am with Dr. Sabra Heck. Please advise on refill.

## 2020-04-25 ENCOUNTER — Ambulatory Visit (HOSPITAL_COMMUNITY)
Admission: RE | Admit: 2020-04-25 | Discharge: 2020-04-25 | Disposition: A | Discharge status: Home / Self Care | Payer: Medicare Other | Source: Ambulatory Visit | Attending: Emergency Medicine | Admitting: Emergency Medicine

## 2020-04-25 ENCOUNTER — Other Ambulatory Visit: Payer: Self-pay

## 2020-04-25 ENCOUNTER — Encounter (HOSPITAL_COMMUNITY): Payer: Self-pay

## 2020-04-25 VITALS — BP 120/80 | HR 89 | Temp 98.4°F | Resp 18 | Ht 66.0 in | Wt 242.0 lb

## 2020-04-25 DIAGNOSIS — J209 Acute bronchitis, unspecified: Secondary | ICD-10-CM

## 2020-04-25 DIAGNOSIS — Z803 Family history of malignant neoplasm of breast: Secondary | ICD-10-CM | POA: Insufficient documentation

## 2020-04-25 DIAGNOSIS — Z833 Family history of diabetes mellitus: Secondary | ICD-10-CM | POA: Diagnosis not present

## 2020-04-25 DIAGNOSIS — J069 Acute upper respiratory infection, unspecified: Secondary | ICD-10-CM | POA: Insufficient documentation

## 2020-04-25 DIAGNOSIS — R05 Cough: Secondary | ICD-10-CM | POA: Diagnosis present

## 2020-04-25 DIAGNOSIS — Z20822 Contact with and (suspected) exposure to covid-19: Secondary | ICD-10-CM | POA: Diagnosis not present

## 2020-04-25 DIAGNOSIS — Z880 Allergy status to penicillin: Secondary | ICD-10-CM | POA: Insufficient documentation

## 2020-04-25 DIAGNOSIS — Z8249 Family history of ischemic heart disease and other diseases of the circulatory system: Secondary | ICD-10-CM | POA: Diagnosis not present

## 2020-04-25 MED ORDER — AZITHROMYCIN 250 MG PO TABS: ORAL_TABLET | ORAL | 0 refills | Status: DC

## 2020-04-25 MED ORDER — ALBUTEROL SULFATE HFA 108 (90 BASE) MCG/ACT IN AERS: 1.0000 | INHALATION_SPRAY | Freq: Four times a day (QID) | RESPIRATORY_TRACT | 0 refills | Status: DC | PRN

## 2020-04-25 NOTE — ED Provider Notes (Signed)
[facility]    CSN: [id number] Arrival date & time: 04/25/20  1249      History   Chief Complaint Chief Complaint  Patient presents with  . Cough    HPI [name] is a 69 y.o. female. She reports nasal congestion and mild cough that began 6 days ago. 2 days ago symptoms worsened. She developed hoarseness and chest congestion. In the past she developed bronchitis once it got to her chest. Denies fever or chills. Denies shortness of breath but she is wheezing. She usually gets bronchitis once a year   HPI  Past Medical History:  Diagnosis Date  . Abnormal Pap smear of vagina 06/17/15   LGSIL  . Allergic rhinitis, cause unspecified   . Arthritis   . ARTHRITIS, KNEE 02/10/2009   Qualifier: Diagnosis of  By: Niel Hummer MD, Lorinda Creed   . BRCA negative 03/25/13  . Cervical cancer ([place]) 1984  . Cervical disc disease 02/11/2012  . Chronic LBP   . Degenerative arthritis of hip 02/11/2012  . Diverticulosis 02/11/2012  . GERD 08/10/2007   Qualifier: Diagnosis of  By: Sherlynn Stalls, CMA, [place]    . GERD (gastroesophageal reflux disease)   . GERD (gastroesophageal reflux disease)   . HEMORRHOIDS-INTERNAL 07/21/2010   Qualifier: Diagnosis of  By: Chester Holstein NP, Nevin Bloodgood    . Hiatal hernia   . Hyperlipidemia 02/13/2012  . Mitral valve prolapse   . OAB (overactive bladder)   . Obesity   . OVERACTIVE BLADDER 08/10/2007   Qualifier: Diagnosis of  By: Sherlynn Stalls, CMA, [place]    . Pneumonia   . PONV (postoperative nausea and vomiting)   . Recurrent UTI   . SUI (stress urinary incontinence, female)   . VAGINITIS, ATROPHIC 11/11/2008   Qualifier: Diagnosis of  By: Niel Hummer MD, Lorinda Creed     Patient Active Problem List   Diagnosis Date Noted  . Left cervical radiculopathy 10/07/2019  . Bilateral low back pain with bilateral sciatica 10/07/2019  . Acute upper respiratory infection 01/17/2019  . Acute conjunctivitis of left eye 01/17/2019  . Urinary frequency 01/09/2019  . Cough 05/03/2018  .  Dysuria 03/28/2018  . Trimalleolar fracture 08/20/2017  . Ankle fracture 08/20/2017  . OSA on CPAP 07/30/2017  . Right hip pain 07/28/2017  . Abnormal TSH 07/21/2015  . URI (upper respiratory infection) 04/17/2015  . Greater trochanteric bursitis of right hip 08/04/2014  . Localized osteoarthrosis, lower leg 08/04/2014  . Biliary colic 33/82/5053  . Cholelithiasis 08/07/2013  . Left otitis media 12/07/2012  . Hyperlipidemia 02/13/2012  . Impaired glucose tolerance 02/13/2012  . Preventative health care 02/11/2012  . Diverticulosis 02/11/2012  . Degenerative arthritis of hip 02/11/2012  . Cervical disc disease 02/11/2012  . Cervical cancer ([place])   . Allergic rhinitis   . Chronic low back pain   . Obesity   . Blood in stool 08/31/2010  . HEMORRHOIDS-INTERNAL 07/21/2010  . CONSTIPATION 07/19/2010  . ARTHRITIS, KNEE 02/10/2009  . VAGINITIS, ATROPHIC 11/11/2008  . EXOGENOUS OBESITY 07/29/2008  . GERD 08/10/2007  . OVERACTIVE BLADDER 08/10/2007    Past Surgical History:  Procedure Laterality Date  . BUNIONECTOMY  2007  . CERVICAL DISCECTOMY  2002  . CHOLECYSTECTOMY N/A 08/13/2013   Procedure: LAPAROSCOPIC CHOLECYSTECTOMY WITH INTRAOPERATIVE CHOLANGIOGRAM;  Surgeon: Shann Medal, MD;  Location: [facility];  Service: General;  Laterality: N/A;  . CLOSED REDUCTION METATARSAL FRACTURE     rt side 06-03-18, left side 08-19-17  . COLONOSCOPY    .  conization of cervix  1982   with D&C  . CYSTOCELE REPAIR  2003   rectocele repair  . EYE SURGERY Bilateral    lasik  . ORIF ANKLE FRACTURE Right 08/20/2017   Procedure: OPEN REDUCTION INTERNAL FIXATION (ORIF) ANKLE FRACTURE;  Surgeon: Meredith Pel, MD;  Location: [facility];  Service: Orthopedics;  Laterality: Right;  . SHOULDER ARTHROSCOPY  2004   frozen shoulder  . TONSILLECTOMY    . VAGINAL HYSTERECTOMY  1986    OB History    Gravida  1   Para  1   Term  1   Preterm      AB      Living  1     SAB      TAB       Ectopic      Multiple      Live Births               Home Medications    Prior to Admission medications   Medication Sig Start Date End Date Taking? Authorizing Provider  cholecalciferol (VITAMIN D3) 25 MCG (1000 UNIT) tablet Take 1,000 Units by mouth daily.   Yes [provider]  albuterol (VENTOLIN HFA) 108 (90 Base) MCG/ACT inhaler Inhale 1-2 puffs into the lungs every 6 (six) hours as needed for wheezing or shortness of breath. 04/25/20   Domingo Dimes, PA-C  azithromycin (ZITHROMAX Z-PAK) 250 MG tablet Take 2 tablets on day 1 and then 1 tablet daily until complete 04/25/20   Domingo Dimes, PA-C  Estradiol (YUVAFEM) 10 MCG TABS vaginal tablet Place 1 tablet (10 mcg total) vaginally 2 (two) times a week. 07/09/18   Regina Eck, CNM  HYDROcodone-acetaminophen (NORCO/VICODIN) 5-325 MG tablet Take 1 tablet by mouth every 6 (six) hours as needed for moderate pain. 10/07/19   Biagio Borg, MD  MULTIPLE VITAMIN tablet Take 1 tablet by mouth daily.    [provider]  nabumetone (RELAFEN) 750 MG tablet TAKE 1 TABLET BY MOUTH TWICE A DAY AS NEEDED FOR ARTHRITIS 08/02/19   Biagio Borg, MD  pantoprazole (PROTONIX) 40 MG tablet Take 1 tablet (40 mg total) by mouth daily. 02/11/20   Biagio Borg, MD  tiZANidine (ZANAFLEX) 2 MG tablet Take 1 tablet (2 mg total) by mouth every 6 (six) hours as needed for muscle spasms. 10/07/19   Biagio Borg, MD  trimethoprim (TRIMPEX) 100 MG tablet 1 tab po daily for UTI prophylaxis 04/09/20   Megan Salon, MD    Family History Family History  Problem Relation Age of Onset  . Breast cancer Mother        dx in her last 37's  . Heart disease Sister   . Breast cancer Sister        dx in her late 66's  . Diabetes Brother   . Heart disease Brother        triple bypass  . Diabetes Maternal Aunt   . Diabetes Maternal Uncle   . Cancer Maternal Grandmother        uterine  . Thyroid cancer Other        2 cousins  . Breast  cancer Cousin     Social History Social History   Tobacco Use  . Smoking status: Never Smoker  . Smokeless tobacco: Never Used  Vaping Use  . Vaping Use: Never used  Substance Use Topics  . Alcohol use: No    Alcohol/week: 0.0 standard drinks  .  Drug use: No     Allergies   Amoxicillin and Penicillins   Review of Systems Review of Systems  Constitutional: Positive for fatigue. Negative for chills and fever.  HENT: Positive for postnasal drip, sinus pressure, sinus pain, sore throat and voice change.        Hoarseness   Respiratory: Positive for cough and wheezing. Negative for chest tightness and shortness of breath.   Cardiovascular: Negative for chest pain.  Gastrointestinal: Positive for diarrhea. Negative for abdominal pain, nausea and vomiting.  Neurological: Positive for headaches. Negative for dizziness and numbness.     Physical Exam Triage Vital Signs ED Triage Vitals  Enc Vitals Group     BP 04/25/20 1307 120/80     Pulse Rate 04/25/20 1307 89     Resp 04/25/20 1307 18     Temp 04/25/20 1307 98.4 F (36.9 C)     Temp Source 04/25/20 1307 Oral     SpO2 04/25/20 1307 96 %     Weight 04/25/20 1308 242 lb (109.8 kg)     Height 04/25/20 1308 _0  (1.676 m)     Head Circumference --      Peak Flow --      Pain Score 04/25/20 1308 0     Pain Loc --      Pain Edu? --      Excl. in [employer]? --    No data found.  Updated Vital Signs BP 120/80   Pulse 89   Temp 98.4 F (36.9 C) (Oral)   Resp 18   Ht _1  (1.676 m)   Wt 242 lb (109.8 kg)   SpO2 96%   BMI 39.06 kg/m   Visual Acuity Right Eye Distance:   Left Eye Distance:   Bilateral Distance:    Right Eye Near:   Left Eye Near:    Bilateral Near:     Physical Exam Vitals reviewed.  Constitutional:      General: She is not in acute distress.    Appearance: Normal appearance. She is not ill-appearing.  HENT:     Head: Normocephalic and atraumatic.     Right Ear: Tympanic membrane and  external ear normal. There is no impacted cerumen.     Left Ear: Tympanic membrane and external ear normal. There is no impacted cerumen.     Nose: Nose normal. No congestion or rhinorrhea.     Mouth/Throat:     Mouth: Mucous membranes are moist.     Pharynx: No oropharyngeal exudate.  Eyes:     General:        Right eye: No discharge.        Left eye: No discharge.     Conjunctiva/sclera: Conjunctivae normal.  Cardiovascular:     Pulses: Normal pulses.     Heart sounds: Normal heart sounds.  Pulmonary:     Effort: Pulmonary effort is normal. No respiratory distress.     Breath sounds: Normal breath sounds.  Abdominal:     General: Abdomen is flat. Bowel sounds are normal. There is no distension.  Musculoskeletal:        General: No swelling. Normal range of motion.     Cervical back: Normal range of motion and neck supple.  Skin:    General: Skin is warm.     Coloration: Skin is not jaundiced.  Neurological:     General: No focal deficit present.     Mental Status: She is alert.     Gait:  Gait normal.  Psychiatric:        Mood and Affect: Mood normal.        Behavior: Behavior normal.        Thought Content: Thought content normal.        Judgment: Judgment normal.      UC Treatments / Results  Labs (all labs ordered are listed, but only abnormal results are displayed) Labs Reviewed  SARS CORONAVIRUS 2 (TAT 6-24 HRS)    EKG   Radiology No results found.  Procedures Procedures (including critical care time)  Medications Ordered in UC Medications - No data to display  Initial Impression / Assessment and Plan / UC Course  I have reviewed the triage vital signs and the nursing notes.  Pertinent labs & imaging results that were available during my care of the patient were reviewed by me and considered in my medical decision making (see chart for details).     Bronchitis. Likely viral. [name] states Zpacks have helped in the past. Discussed we can try a  zpack but I recommended waiting at least a week to see if symptoms would resolve. Prescribed an albuterol inhaler and recommended OTC medications to help clear congestion. She will f/u with pcp if no improvement  Final Clinical Impressions(s) / UC Diagnoses   Final diagnoses:  Viral URI with cough  Acute bronchitis, unspecified organism     Discharge Instructions     Please take azithromycin if your symptoms worsen within the next week. I recommend using the inhaler to help clear your airways. Most viral infection take about 1-2 weeks to resolve    ED Prescriptions    Medication Sig Dispense Auth. Provider   azithromycin (ZITHROMAX Z-PAK) 250 MG tablet Take 2 tablets on day 1 and then 1 tablet daily until complete 6 tablet Alfonse Spruce, Lorien Shingler S, PA-C   albuterol (VENTOLIN HFA) 108 (90 Base) MCG/ACT inhaler Inhale 1-2 puffs into the lungs every 6 (six) hours as needed for wheezing or shortness of breath. 18 g Domingo Dimes, PA-C     PDMP not reviewed this encounter.   Domingo Dimes, PA-C 04/25/20 1428

## 2020-04-25 NOTE — ED Triage Notes (Signed)
Pt c/o productive cough w/white phlegm, chest congestion, runny nosex5 days. Pt has non labored breathing. Skin color WNL.

## 2020-04-25 NOTE — Discharge Instructions (Signed)
Please take azithromycin if your symptoms worsen within the next week. I recommend using the inhaler to help clear your airways. Most viral infection take about 1-2 weeks to resolve

## 2020-04-26 LAB — SARS CORONAVIRUS 2 (TAT 6-24 HRS): SARS Coronavirus 2: NEGATIVE

## 2020-05-15 ENCOUNTER — Telehealth: Payer: Self-pay | Admitting: Obstetrics & Gynecology

## 2020-05-15 NOTE — Telephone Encounter (Signed)
Patient wants to speak with the nurse she states she has a UTI.

## 2020-05-15 NOTE — Telephone Encounter (Signed)
Spoke with pt. Pt states possibly having UTI. Pt reports sx of pressure with urinary urgency and frequency x 1 week now. Pt denies urinary burning, vaginal discharge or odor, fever, chills, back pain. Pt states takes Trimpex that was prescribed by DL daily. Pt states did miss a couple of weeks while on vacation, but restarted daily 1.5 weeks ago. Pt states has old Rx of Z-pak that was prescribed by PCP x 1 month ago that she didn't use. Advised not to take. Pt agreeable.  Pt advised to have OV for further evaluation. Pt agreeable. Pt advised no available appts today or Monday with Dr Sabra Heck and pt does not want to see another provider. Pt advised to call for appt with PCP or be seen at Urgent care. Pt agreeable to be seen at urgent care. Pt advised not to wait to be seen due to sx. Pt agreeable and verbalized understanding.   Routing to Dr Sabra Heck for review.  Encounter closed.

## 2020-05-17 ENCOUNTER — Ambulatory Visit (HOSPITAL_COMMUNITY)
Admission: RE | Admit: 2020-05-17 | Discharge: 2020-05-17 | Disposition: A | Discharge status: Home / Self Care | Payer: Medicare Other | Source: Ambulatory Visit | Attending: Family Medicine | Admitting: Family Medicine

## 2020-05-17 ENCOUNTER — Encounter (HOSPITAL_COMMUNITY): Payer: Self-pay

## 2020-05-17 ENCOUNTER — Other Ambulatory Visit: Payer: Self-pay

## 2020-05-17 VITALS — BP 123/81 | HR 79 | Temp 98.6°F | Resp 18

## 2020-05-17 DIAGNOSIS — N390 Urinary tract infection, site not specified: Secondary | ICD-10-CM | POA: Insufficient documentation

## 2020-05-17 DIAGNOSIS — R300 Dysuria: Secondary | ICD-10-CM | POA: Diagnosis not present

## 2020-05-17 LAB — POCT URINALYSIS DIP (DEVICE)
Bilirubin Urine: NEGATIVE
Glucose, UA: NEGATIVE mg/dL
Ketones, ur: NEGATIVE mg/dL
Nitrite: POSITIVE — AB
Protein, ur: NEGATIVE mg/dL
Specific Gravity, Urine: 1.025 (ref 1.005–1.030)
Urobilinogen, UA: 0.2 mg/dL (ref 0.0–1.0)
pH: 6.5 (ref 5.0–8.0)

## 2020-05-17 MED ORDER — SULFAMETHOXAZOLE-TRIMETHOPRIM 800-160 MG PO TABS: 1.0000 | ORAL_TABLET | Freq: Two times a day (BID) | ORAL | 0 refills | Status: AC

## 2020-05-17 NOTE — ED Provider Notes (Signed)
[place]    CSN: [id number] Arrival date & time: 05/17/20  1443      History   Chief Complaint Chief Complaint  Patient presents with   Dysuria    HPI [name] is a 69 y.o. female.   [name] presents with complaints of symptoms consistent with UTI's that she has had in the past- urgency, pressure, tingling to her fingers when she voids. Has been on trimethoprim daily empirically due to recurrent UTI's. She did miss a few days a few weeks ago while she was on vacation. No fevers. No back pain or pelvic pain. She has stress incontinence.    ROS per HPI, negative if not otherwise mentioned.      Past Medical History:  Diagnosis Date   Abnormal Pap smear of vagina 06/17/15   LGSIL   Allergic rhinitis, cause unspecified    Arthritis    ARTHRITIS, KNEE 02/10/2009   Qualifier: Diagnosis of  By: Niel Hummer MD, Willie R    BRCA negative 03/25/13   Cervical cancer ([place]) 1984   Cervical disc disease 02/11/2012   Chronic LBP    Degenerative arthritis of hip 02/11/2012   Diverticulosis 02/11/2012   GERD 08/10/2007   Qualifier: Diagnosis of  By: Sherlynn Stalls, CMA, Cindy     GERD (gastroesophageal reflux disease)    GERD (gastroesophageal reflux disease)    HEMORRHOIDS-INTERNAL 07/21/2010   Qualifier: Diagnosis of  By: Chester Holstein NP, Paula     Hiatal hernia    Hyperlipidemia 02/13/2012   Mitral valve prolapse    OAB (overactive bladder)    Obesity    OVERACTIVE BLADDER 08/10/2007   Qualifier: Diagnosis of  By: Sherlynn Stalls, CMA, Cindy     Pneumonia    PONV (postoperative nausea and vomiting)    Recurrent UTI    SUI (stress urinary incontinence, female)    VAGINITIS, ATROPHIC 11/11/2008   Qualifier: Diagnosis of  By: Niel Hummer MD, Lorinda Creed     Patient Active Problem List   Diagnosis Date Noted   Left cervical radiculopathy 10/07/2019   Bilateral low back pain with bilateral sciatica 10/07/2019   Acute upper respiratory infection 01/17/2019    Acute conjunctivitis of left eye 01/17/2019   Urinary frequency 01/09/2019   Cough 05/03/2018   Dysuria 03/28/2018   Trimalleolar fracture 08/20/2017   Ankle fracture 08/20/2017   OSA on CPAP 07/30/2017   Right hip pain 07/28/2017   Abnormal TSH 07/21/2015   URI (upper respiratory infection) 04/17/2015   Greater trochanteric bursitis of right hip 08/04/2014   Localized osteoarthrosis, lower leg 22/12/5425   Biliary colic 04/30/7627   Cholelithiasis 08/07/2013   Left otitis media 12/07/2012   Hyperlipidemia 02/13/2012   Impaired glucose tolerance 02/13/2012   Preventative health care 02/11/2012   Diverticulosis 02/11/2012   Degenerative arthritis of hip 02/11/2012   Cervical disc disease 02/11/2012   Cervical cancer ([place])    Allergic rhinitis    Chronic low back pain    Obesity    Blood in stool 08/31/2010   HEMORRHOIDS-INTERNAL 07/21/2010   CONSTIPATION 07/19/2010   ARTHRITIS, KNEE 02/10/2009   VAGINITIS, ATROPHIC 11/11/2008   EXOGENOUS OBESITY 07/29/2008   GERD 08/10/2007   OVERACTIVE BLADDER 08/10/2007    Past Surgical History:  Procedure Laterality Date   BUNIONECTOMY  2007   CERVICAL DISCECTOMY  2002   CHOLECYSTECTOMY N/A 08/13/2013   Procedure: LAPAROSCOPIC CHOLECYSTECTOMY WITH INTRAOPERATIVE CHOLANGIOGRAM;  Surgeon: Shann Medal, MD;  Location: [facility];  Service: General;  Laterality: N/A;   CLOSED REDUCTION METATARSAL FRACTURE     rt side 06-03-18, left side 08-19-17   COLONOSCOPY     conization of cervix  1982   with D&C   CYSTOCELE REPAIR  2003   rectocele repair   EYE SURGERY Bilateral    lasik   ORIF ANKLE FRACTURE Right 08/20/2017   Procedure: OPEN REDUCTION INTERNAL FIXATION (ORIF) ANKLE FRACTURE;  Surgeon: Meredith Pel, MD;  Location: [facility];  Service: Orthopedics;  Laterality: Right;   SHOULDER ARTHROSCOPY  2004   frozen shoulder   TONSILLECTOMY     VAGINAL HYSTERECTOMY  1986    OB History     Gravida  1   Para  1   Term  1   Preterm      AB      Living  1     SAB      TAB      Ectopic      Multiple      Live Births               Home Medications    Prior to Admission medications   Medication Sig Start Date End Date Taking? Authorizing Provider  albuterol (VENTOLIN HFA) 108 (90 Base) MCG/ACT inhaler Inhale 1-2 puffs into the lungs every 6 (six) hours as needed for wheezing or shortness of breath. 04/25/20  Yes Domingo Dimes, PA-C  cholecalciferol (VITAMIN D3) 25 MCG (1000 UNIT) tablet Take 1,000 Units by mouth daily.   Yes [provider]  MULTIPLE VITAMIN tablet Take 1 tablet by mouth daily.   Yes [provider]  nabumetone (RELAFEN) 750 MG tablet TAKE 1 TABLET BY MOUTH TWICE A DAY AS NEEDED FOR ARTHRITIS 08/02/19  Yes Biagio Borg, MD  pantoprazole (PROTONIX) 40 MG tablet Take 1 tablet (40 mg total) by mouth daily. 02/11/20  Yes Biagio Borg, MD  trimethoprim (TRIMPEX) 100 MG tablet 1 tab po daily for UTI prophylaxis 04/09/20  Yes Megan Salon, MD  azithromycin (ZITHROMAX Z-PAK) 250 MG tablet Take 2 tablets on day 1 and then 1 tablet daily until complete 04/25/20   Domingo Dimes, PA-C  HYDROcodone-acetaminophen (NORCO/VICODIN) 5-325 MG tablet Take 1 tablet by mouth every 6 (six) hours as needed for moderate pain. 10/07/19   Biagio Borg, MD  sulfamethoxazole-trimethoprim (BACTRIM DS) 800-160 MG tablet Take 1 tablet by mouth 2 (two) times daily for 7 days. 05/17/20 05/24/20  Zigmund Gottron, NP  tiZANidine (ZANAFLEX) 2 MG tablet Take 1 tablet (2 mg total) by mouth every 6 (six) hours as needed for muscle spasms. 10/07/19   Biagio Borg, MD    Family History Family History  Problem Relation Age of Onset   Breast cancer Mother        dx in her last 25's   Heart disease Sister    Breast cancer Sister        dx in her late 67's   Diabetes Brother    Heart disease Brother        triple bypass   Diabetes Maternal Aunt     Diabetes Maternal Uncle    Cancer Maternal Grandmother        uterine   Thyroid cancer Other        2 cousins   Breast cancer Cousin     Social History Social History   Tobacco Use   Smoking status: Never Smoker   Smokeless tobacco: Never Used  Vaping Use   Vaping Use: Never used  Substance Use Topics   Alcohol use: No    Alcohol/week: 0.0 standard drinks   Drug use: No     Allergies   Amoxicillin and Penicillins   Review of Systems Review of Systems   Physical Exam Triage Vital Signs ED Triage Vitals  Enc Vitals Group     BP 05/17/20 1519 123/81     Pulse Rate 05/17/20 1519 79     Resp 05/17/20 1519 18     Temp 05/17/20 1519 98.6 F (37 C)     Temp Source 05/17/20 1519 Oral     SpO2 05/17/20 1519 97 %     Weight --      Height --      Head Circumference --      Peak Flow --      Pain Score 05/17/20 1517 0     Pain Loc --      Pain Edu? --      Excl. in [place]? --    No data found.  Updated Vital Signs BP 123/81 (BP Location: Right Arm)    Pulse 79    Temp 98.6 F (37 C) (Oral)    Resp 18    SpO2 97%   Visual Acuity Right Eye Distance:   Left Eye Distance:   Bilateral Distance:    Right Eye Near:   Left Eye Near:    Bilateral Near:     Physical Exam Constitutional:      General: She is not in acute distress.    Appearance: She is well-developed.  Cardiovascular:     Rate and Rhythm: Normal rate.  Pulmonary:     Effort: Pulmonary effort is normal.  Abdominal:     Tenderness: There is no abdominal tenderness. There is no right CVA tenderness or left CVA tenderness.  Skin:    General: Skin is warm and dry.  Neurological:     Mental Status: She is alert and oriented to person, place, and time.      UC Treatments / Results  Labs (all labs ordered are listed, but only abnormal results are displayed) Labs Reviewed  POCT URINALYSIS DIP (DEVICE) - Abnormal; Notable for the following components:      Result Value   Hgb urine dipstick  TRACE (*)    Nitrite POSITIVE (*)    Leukocytes,Ua MODERATE (*)    All other components within normal limits  URINE CULTURE    EKG   Radiology No results found.  Procedures Procedures (including critical care time)  Medications Ordered in UC Medications - No data to display  Initial Impression / Assessment and Plan / UC Course  I have reviewed the triage vital signs and the nursing notes.  Pertinent labs & imaging results that were available during my care of the patient were reviewed by me and considered in my medical decision making (see chart for details).     Leukocytes and nitrite to urine. Culture obtained and 7 day course of bactrim provided. Most recent culture shows sensitivity. Return precautions provided. Patient verbalized understanding and agreeable to plan.   Final Clinical Impressions(s) / UC Diagnoses   Final diagnoses:  Lower urinary tract infectious disease     Discharge Instructions     Your urine is consistent with UTI I have sent bactrim to the pharmacy, complete course.  I have sent your urine to be cultured, we would call you if this indicates need to change antibiotics.  Continue to follow with your PCP and/or urology.  If worsening please return.    ED Prescriptions    Medication Sig Dispense Auth. Provider   sulfamethoxazole-trimethoprim (BACTRIM DS) 800-160 MG tablet Take 1 tablet by mouth 2 (two) times daily for 7 days. 14 tablet Zigmund Gottron, NP     PDMP not reviewed this encounter.   Zigmund Gottron, NP 05/17/20 1555

## 2020-05-17 NOTE — ED Triage Notes (Signed)
Pt c/o urinary urgency and pressure for approx 1 week.  Denies hematuria, flank/back/abdominal pain, n/v/d, fever, or chills.  Pt states she takes an ABX every day to prevent UTI but missed several days of medication last week 2/2 being out of town.

## 2020-05-17 NOTE — Discharge Instructions (Signed)
Your urine is consistent with UTI I have sent bactrim to the pharmacy, complete course.  I have sent your urine to be cultured, we would call you if this indicates need to change antibiotics.  Continue to follow with your PCP and/or urology.  If worsening please return.

## 2020-05-19 LAB — URINE CULTURE: Culture: >=100000 — AB

## 2020-05-20 ENCOUNTER — Telehealth (HOSPITAL_COMMUNITY): Payer: Self-pay | Admitting: Emergency Medicine

## 2020-05-20 MED ORDER — NITROFURANTOIN MONOHYD MACRO 100 MG PO CAPS: 100.0000 mg | ORAL_CAPSULE | Freq: Two times a day (BID) | ORAL | 0 refills | Status: AC

## 2020-05-20 NOTE — Telephone Encounter (Signed)
Pt sent home on Bactrim for UTI. Resistant on urine culture. Will need to change therapy to Nitrofurantoin 100mg  PO BID x 5 days, per protocol. Sent in to pharmacy on file.   Attempted to call patient x 1, no answer, LVM

## 2020-07-08 ENCOUNTER — Ambulatory Visit: Payer: Medicare Other | Admitting: Certified Nurse Midwife

## 2020-07-09 ENCOUNTER — Ambulatory Visit (INDEPENDENT_AMBULATORY_CARE_PROVIDER_SITE_OTHER): Payer: Medicare Other | Admitting: Obstetrics & Gynecology

## 2020-07-09 ENCOUNTER — Other Ambulatory Visit: Payer: Self-pay

## 2020-07-09 ENCOUNTER — Encounter: Payer: Self-pay | Admitting: Obstetrics & Gynecology

## 2020-07-09 VITALS — BP 120/78 | HR 70 | Resp 16 | Ht 66.75 in | Wt 243.0 lb

## 2020-07-09 DIAGNOSIS — N390 Urinary tract infection, site not specified: Secondary | ICD-10-CM | POA: Diagnosis not present

## 2020-07-09 DIAGNOSIS — Z01411 Encounter for gynecological examination (general) (routine) with abnormal findings: Secondary | ICD-10-CM

## 2020-07-09 DIAGNOSIS — Z01419 Encounter for gynecological examination (general) (routine) without abnormal findings: Secondary | ICD-10-CM | POA: Diagnosis not present

## 2020-07-09 DIAGNOSIS — K625 Hemorrhage of anus and rectum: Secondary | ICD-10-CM

## 2020-07-09 DIAGNOSIS — N8110 Cystocele, unspecified: Secondary | ICD-10-CM

## 2020-07-09 DIAGNOSIS — K642 Third degree hemorrhoids: Secondary | ICD-10-CM | POA: Diagnosis not present

## 2020-07-09 DIAGNOSIS — Z9189 Other specified personal risk factors, not elsewhere classified: Secondary | ICD-10-CM

## 2020-07-09 MED ORDER — TRIMETHOPRIM 100 MG PO TABS: ORAL_TABLET | ORAL | 4 refills | Status: DC

## 2020-07-09 MED ORDER — ESTRADIOL 0.1 MG/GM VA CREA: TOPICAL_CREAM | VAGINAL | 3 refills | Status: DC

## 2020-07-09 NOTE — Progress Notes (Signed)
69 y.o. [id number] Married White or Caucasian female here for breast and pelvic exam.  I am also following her for recurrent UTI.  She takes trimethoprim daily.  Missed a few in late June and ended up with a UTI.  She went to urgent care for this.  Culture showed E coli.  She saw urologist for consultation in the past and she started daily suppressive therapy.  We discussed other strategies today to try and decrease the antibiotic use including vaginal estrogen.  She is open to trying this.    Denies vaginal bleeding but does have frequent rectal bleeding with prolapsed hemorrhoid.  Would like to see Dr. Marcello Moores for consultation about removal.  Pt also has hx of abnormal pap smear in 2017 with LGSIL pap/+HRHPV.  Pt underwent a TVH in 1986  No LMP recorded. Patient has had a hysterectomy.          Sexually active: Yes.    H/O STD:  no  Health Maintenance: PCP:  Cathlean Cower.  Last wellness appt was last 07/2019.  Did blood work at that appt: yes, scheduled for wellness appt 08-06-2020  Vaccines are up to date:  Yes except has not done shingrix.  D/w pt today Colonoscopy:  2018 f/u 54yr MMG:  07-10-2019 category b density birads 1:neg, scheduled for next week BMD:  2019 f/u 263yr scheduled for next week Last pap smear: 07-06-2018 neg HPV HR neg 07-08-2019 neg   H/o abnormal pap smear:  yes    reports that she has never smoked. She has never used smokeless tobacco. She reports that she does not drink alcohol and does not use drugs.  Past Medical History:  Diagnosis Date  . Abnormal Pap smear of vagina 06/17/15   LGSIL  . Allergic rhinitis, cause unspecified   . Arthritis   . ARTHRITIS, KNEE 02/10/2009   Qualifier: Diagnosis of  By: StNiel HummerD, WiLorinda Creed . BRCA negative 03/25/13  . [place]  . Cervical disc disease 02/11/2012  . Chronic LBP   . Degenerative arthritis of hip 02/11/2012  . Diverticulosis 02/11/2012  . GERD 08/10/2007   Qualifier: Diagnosis of  By: WySherlynn StallsCMA, CiDubuque   . GERD (gastroesophageal reflux disease)   . GERD (gastroesophageal reflux disease)   . HEMORRHOIDS-INTERNAL 07/21/2010   Qualifier: Diagnosis of  By: GuChester HolsteinP, PaNevin Bloodgood  . Hiatal hernia   . Hyperlipidemia 02/13/2012  . Mitral valve prolapse   . OAB (overactive bladder)   . Obesity   . OVERACTIVE BLADDER 08/10/2007   Qualifier: Diagnosis of  By: WySherlynn StallsCMA, CiBelle Chasse  . Pneumonia   . PONV (postoperative nausea and vomiting)   . Recurrent UTI   . SUI (stress urinary incontinence, female)   . VAGINITIS, ATROPHIC 11/11/2008   Qualifier: Diagnosis of  By: StNiel HummerD, WiLorinda Creed   Past Surgical History:  Procedure Laterality Date  . BUNIONECTOMY  2007  . CERVICAL DISCECTOMY  2002  . CHOLECYSTECTOMY N/A 08/13/2013   Procedure: LAPAROSCOPIC CHOLECYSTECTOMY WITH INTRAOPERATIVE CHOLANGIOGRAM;  Surgeon: DaShann MedalMD;  [facility] Service: General;  Laterality: N/A;  . CLOSED REDUCTION METATARSAL FRACTURE     rt side 06-03-18, left side 08-19-17  . COLONOSCOPY    . conization of cervix  1982   with D&C  . CYSTOCELE REPAIR  2003   rectocele repair  . EYE SURGERY Bilateral    lasik  . ORIF ANKLE  FRACTURE Right 08/20/2017   Procedure: OPEN REDUCTION INTERNAL FIXATION (ORIF) ANKLE FRACTURE;  Surgeon: Meredith Pel, MD;  Location: [facility];  Service: Orthopedics;  Laterality: Right;  . SHOULDER ARTHROSCOPY  2004   frozen shoulder  . TONSILLECTOMY    . VAGINAL HYSTERECTOMY  1986    Current Outpatient Medications  Medication Sig Dispense Refill  . cholecalciferol (VITAMIN D3) 25 MCG (1000 UNIT) tablet Take 1,000 Units by mouth daily.    . MULTIPLE VITAMIN tablet Take 1 tablet by mouth daily.    . nabumetone (RELAFEN) 750 MG tablet TAKE 1 TABLET BY MOUTH TWICE A DAY AS NEEDED FOR ARTHRITIS 180 tablet 3  . pantoprazole (PROTONIX) 40 MG tablet Take 1 tablet (40 mg total) by mouth daily. 90 tablet 1  . trimethoprim (TRIMPEX) 100 MG tablet 1 tab po daily for UTI prophylaxis 30  tablet 4  . albuterol (VENTOLIN HFA) 108 (90 Base) MCG/ACT inhaler Inhale 1-2 puffs into the lungs every 6 (six) hours as needed for wheezing or shortness of breath. (Patient not taking: Reported on 07/09/2020) 18 g 0   No current facility-administered medications for this visit.    Family History  Problem Relation Age of Onset  . Breast cancer Mother        dx in her last 76's  . Heart disease Sister   . Breast cancer Sister        dx in her late 89's  . Diabetes Brother   . Heart disease Brother        triple bypass  . Diabetes Maternal Aunt   . Diabetes Maternal Uncle   . Cancer Maternal Grandmother        uterine  . Thyroid cancer Other        2 cousins  . Breast cancer Cousin     Review of Systems  Constitutional: Negative.   HENT: Negative.   Eyes: Negative.   Respiratory: Negative.   Cardiovascular: Negative.   Gastrointestinal: Negative.   Endocrine: Negative.   Genitourinary: Negative.   Musculoskeletal: Negative.   Skin: Negative.   Allergic/Immunologic: Negative.   Neurological: Negative.   Hematological: Negative.   Psychiatric/Behavioral: Negative.     Exam:   BP 120/78   Pulse 70   Resp 16   Ht 5' 6.75" (1.695 m)   Wt 243 lb (110.2 kg)   BMI 38.34 kg/m   Height: 5' 6.75" (169.5 cm)  General appearance: alert, cooperative and appears stated age Breasts: normal appearance, no masses or tenderness Abdomen: soft, non-tender; bowel sounds normal; no masses,  no organomegaly Lymph nodes: Cervical, supraclavicular, and axillary nodes normal.  No abnormal inguinal nodes palpated Neurologic: Grossly normal  Pelvic: External genitalia:  no lesions              Urethra:  normal appearing urethra with no masses, tenderness or lesions              Bartholins and Skenes: normal                 Vagina: normal appearing vagina with normal color and discharge, no lesions, 2nd degree cystocele noted today              Cervix: absent              Pap taken:  No. Bimanual Exam:  Uterus:  uterus absent              Adnexa: no mass, fullness, tenderness  Rectovaginal: Confirms               Anus:  normal sphincter tone, no lesions  Chaperone, Royal Hawthorn, CMA, was present for exam.  A:  Breast and Pelvic exam H/o LGSIL pap smer with +HR HPV 2/17, negative colposcopy (nl pap 2018, 2019 with neg HR HPV, 2020) H/o cystocele repair with mild recurrent Vaginal atrophy Recurrent UTI's, currently on suppressive therapy Hemorrhoids and rectal bleeding.   OAB  P:   Mammogram guidelines reviewed pap smear not obtained today Colonoscopy 2018, follow up 5 years RF for trimethoprim 146m daily Will start estrace vaginal cream 1 grm pv twice daily.  Rx to pharmacy.  If does not have new UTI in next six months, will consider decreasing trimethoprim down to every other day.  #90/4RF Had wellness exam scheduled with Dr. JJenny Reichmannin September. return annually or prn

## 2020-07-12 ENCOUNTER — Encounter: Payer: Self-pay | Admitting: Obstetrics & Gynecology

## 2020-07-15 ENCOUNTER — Encounter: Payer: Self-pay | Admitting: Internal Medicine

## 2020-07-15 DIAGNOSIS — M8589 Other specified disorders of bone density and structure, multiple sites: Secondary | ICD-10-CM | POA: Diagnosis not present

## 2020-07-15 DIAGNOSIS — Z1231 Encounter for screening mammogram for malignant neoplasm of breast: Secondary | ICD-10-CM | POA: Diagnosis not present

## 2020-07-15 LAB — HM DEXA SCAN: HM Dexa Scan: -2

## 2020-07-20 ENCOUNTER — Ambulatory Visit: Payer: Self-pay | Admitting: General Surgery

## 2020-07-20 DIAGNOSIS — K642 Third degree hemorrhoids: Secondary | ICD-10-CM | POA: Diagnosis not present

## 2020-07-20 NOTE — H&P (View-Only) (Signed)
The patient is a 69 year old female who presents with a complaint of Rectal bleeding. 69 year old female who presented to the office with rectal bleeding ~3 yrs ago. I recommended surgery at that time but the patient had to postpone this due to a broken ankle. She returns today with increasing rectal bleeding occurring multiple times a week. She reports regular bowel habits with occasional constipation but minimal straining. She underwent a colonoscopy in 2018, which was negative for any other signs of bleeding. She states that she did have her hemorrhoids injected with hypertonic saline in the past but has had no other surgical procedures. She denies any rectal pain.   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 Gallbladder Surgery - Laparoscopic Hysterectomy (not due to cancer) - Partial Shoulder Surgery Right. Spinal Surgery - Neck Tonsillectomy  Diagnostic Studies History Leighton Ruff, MD; 9/37/1696 10:28 AM) Colonoscopy within last year Mammogram within last year Pap Smear 1-5 years ago  Allergies (Chanel Teressa Senter, CMA; 07/20/2020 10:11 AM) Amoxicillin *PENICILLINS* Penicillins Allergies Reconciled  Medication History (Chanel Teressa Senter, CMA; 07/20/2020 10:11 AM) Nabumetone (750MG  Tablet, Oral) Active. Nitrofurantoin Macrocrystal (100MG  Capsule, Oral) Active. Pantoprazole Sodium (40MG  Tablet DR, Oral) Active. Yuvafem (10MCG Tablet, Vaginal) Active. Multivitamins/Minerals (Oral) Active. Vitamin B12 (100MCG Tablet, Oral) Active. Vitamin D3 (2000UNIT Tablet, Oral) Active. Aspirin (81MG  Tablet, Oral) Active. Hydrocodone-Acetaminophen (Oral as needed) Specific strength unknown - Active. Medications Reconciled  Social History Leighton Ruff, MD; 7/89/3810 10:28 AM) Alcohol use Occasional alcohol  use. Caffeine use Carbonated beverages. No drug use Tobacco use Never smoker.  Family History Leighton Ruff, MD; 1/75/1025 10:28 AM) Arthritis Mother, Sister. Breast Cancer Family Members In General, Mother, Sister. Cancer Family Members In General. Cerebrovascular Accident Brother, Mother. Diabetes Mellitus Brother, Family Members In General. Heart Disease Brother, Family Members In General. Thyroid problems Family Members In General.  Pregnancy / Birth History Leighton Ruff, MD; 8/52/7782 10:28 AM) Age at menarche 8 years. Age of menopause <47 Contraceptive History Oral contraceptives. Gravida 1 Length (months) of breastfeeding 3-6 Maternal age 82-30 Para 1  Other Problems Leighton Ruff, MD; 02/28/5360 10:28 AM) Arthritis Bladder Problems Cervical Cancer Cholelithiasis Diverticulosis Gastric Ulcer Gastroesophageal Reflux Disease Heart murmur Hemorrhoids Kidney Stone     Review of Systems Leighton Ruff MD; 4/43/1540 10:28 AM) General Not Present- Appetite Loss, Chills, Fatigue, Fever, Night Sweats, Weight Gain and Weight Loss. Skin Not Present- Change in Wart/Mole, Dryness, Hives, Jaundice, New Lesions, Non-Healing Wounds, Rash and Ulcer. HEENT Present- Wears glasses/contact lenses. Not Present- Earache, Hearing Loss, Hoarseness, Nose Bleed, Oral Ulcers, Ringing in the Ears, Seasonal Allergies, Sinus Pain, Sore Throat, Visual Disturbances and Yellow Eyes. Respiratory Present- Snoring. Not Present- Bloody sputum, Chronic Cough, Difficulty Breathing and Wheezing. Breast Not Present- Breast Mass, Breast Pain, Nipple Discharge and Skin Changes. Cardiovascular Not Present- Chest Pain, Difficulty Breathing Lying Down, Leg Cramps, Palpitations, Rapid Heart Rate, Shortness of Breath and Swelling of Extremities. Gastrointestinal Present- Bloody Stool and Hemorrhoids. Not Present- Abdominal Pain, Bloating, Change in Bowel Habits, Chronic diarrhea,  Constipation, Difficulty Swallowing, Excessive gas, Gets full quickly at meals, Indigestion, Nausea, Rectal Pain and Vomiting. Female Genitourinary Present- Urgency. Not Present- Frequency, Nocturia, Painful Urination and Pelvic Pain. Musculoskeletal Present- Joint Stiffness. Not Present- Back Pain, Joint Pain, Muscle Pain, Muscle Weakness and Swelling of Extremities. Neurological Not Present- Decreased Memory, Fainting, Headaches, Numbness, Seizures, Tingling, Tremor, Trouble walking and Weakness. Psychiatric Not  Present- Anxiety, Bipolar, Change in Sleep Pattern, Depression, Fearful and Frequent crying. Endocrine Present- Heat Intolerance. Not Present- Cold Intolerance, Excessive Hunger, Hair Changes, Hot flashes and New Diabetes. Hematology Present- Easy Bruising. Not Present- Blood Thinners, Excessive bleeding, Gland problems, HIV and Persistent Infections.  Vitals (Chanel Nolan CMA; 07/20/2020 10:11 AM) 07/20/2020 10:11 AM Weight: 244.5 lb Height: 66.75in Body Surface Area: 2.2 m Body Mass Index: 38.58 kg/m  Temp.: 97.41F  Pulse: 98 (Regular)  BP: 122/80(Sitting, Left Arm, Standard)  Physical Exam Leighton Ruff MD; 9/84/2103 10:29 AM)  General Mental Status-Alert. General Appearance-Cooperative.  Rectal Anorectal Exam External - skin tag. Internal - normal sphincter tone.    Other: Procedure: Anoscopy....[name] KitchenMarland KitchenSurgeon: Marcello Moores....[name] KitchenMarland KitchenAfter the risks and benefits were explained, verbal consent was obtained for above procedure. A medical assistant chaperone was present thoroughout the entire procedure. ....[name] KitchenMarland KitchenAnesthesia: none....[name] KitchenMarland KitchenDiagnosis: rectal bleeding....[name] KitchenMarland KitchenFindings: Grade 3 right posterior hemorrhoid with signs of ulceration, grade 2, right anterior hemorrhoid. Also, with signs of ulceration. Grade 1 left lateral hemorrhoid  Performed: 07/20/2020 10:28 AM    Assessment & Plan Leighton Ruff MD; 12/04/1186 10:30 AM)  PROLAPSED INTERNAL HEMORRHOIDS,  GRADE 3 (Q77.3) Impression: 69 year old female who presents to the office with ongoing rectal bleeding and grade 2 and 3 internal hemorrhoids. We discussed complete hemorrhoidectomy versus transient hemorrhoidal dearterialization. I do not think she would be a good candidate for rubber band ligation in the office. After complete discussion, she has decided to proceed with transsphenoidal dearterialization. We have discussed the typical risk of surgery which include pain, bleeding and recurrence. We also discussed the typical post procedure recovery. All questions were answered.

## 2020-07-20 NOTE — H&P (Signed)
The patient is a 69 year old female who presents with a complaint of Rectal bleeding. 69 year old female who presented to the office with rectal bleeding ~3 yrs ago. I recommended surgery at that time but the patient had to postpone this due to a broken ankle. She returns today with increasing rectal bleeding occurring multiple times a week. She reports regular bowel habits with occasional constipation but minimal straining. She underwent a colonoscopy in 2018, which was negative for any other signs of bleeding. She states that she did have her hemorrhoids injected with hypertonic saline in the past but has had no other surgical procedures. She denies any rectal pain.   Problem List/Past Medical Leighton Ruff, MD; 5/00/9381 10:28 AM) PROLAPSED INTERNAL HEMORRHOIDS, GRADE 3 (W29.9)  Past Surgical History Leighton Ruff, MD; 3/71/6967 10:28 AM) Foot Surgery Right. Gallbladder Surgery - Laparoscopic Hysterectomy (not due to cancer) - Partial Shoulder Surgery Right. Spinal Surgery - Neck Tonsillectomy  Diagnostic Studies History Leighton Ruff, MD; 8/93/8101 10:28 AM) Colonoscopy within last year Mammogram within last year Pap Smear 1-5 years ago  Allergies (Chanel Teressa Senter, CMA; 07/20/2020 10:11 AM) Amoxicillin *PENICILLINS* Penicillins Allergies Reconciled  Medication History (Chanel Teressa Senter, CMA; 07/20/2020 10:11 AM) Nabumetone (750MG  Tablet, Oral) Active. Nitrofurantoin Macrocrystal (100MG  Capsule, Oral) Active. Pantoprazole Sodium (40MG  Tablet DR, Oral) Active. Yuvafem (10MCG Tablet, Vaginal) Active. Multivitamins/Minerals (Oral) Active. Vitamin B12 (100MCG Tablet, Oral) Active. Vitamin D3 (2000UNIT Tablet, Oral) Active. Aspirin (81MG  Tablet, Oral) Active. Hydrocodone-Acetaminophen (Oral as needed) Specific strength unknown - Active. Medications Reconciled  Social History Leighton Ruff, MD; 7/51/0258 10:28 AM) Alcohol use Occasional alcohol  use. Caffeine use Carbonated beverages. No drug use Tobacco use Never smoker.  Family History Leighton Ruff, MD; 04/03/7823 10:28 AM) Arthritis Mother, Sister. Breast Cancer Family Members In General, Mother, Sister. Cancer Family Members In General. Cerebrovascular Accident Brother, Mother. Diabetes Mellitus Brother, Family Members In General. Heart Disease Brother, Family Members In General. Thyroid problems Family Members In General.  Pregnancy / Birth History Leighton Ruff, MD; 2/35/3614 10:28 AM) Age at menarche 41 years. Age of menopause <47 Contraceptive History Oral contraceptives. Gravida 1 Length (months) of breastfeeding 3-6 Maternal age 59-30 Para 1  Other Problems Leighton Ruff, MD; 4/31/5400 10:28 AM) Arthritis Bladder Problems Cervical Cancer Cholelithiasis Diverticulosis Gastric Ulcer Gastroesophageal Reflux Disease Heart murmur Hemorrhoids Kidney Stone     Review of Systems Leighton Ruff MD; 8/67/6195 10:28 AM) General Not Present- Appetite Loss, Chills, Fatigue, Fever, Night Sweats, Weight Gain and Weight Loss. Skin Not Present- Change in Wart/Mole, Dryness, Hives, Jaundice, New Lesions, Non-Healing Wounds, Rash and Ulcer. HEENT Present- Wears glasses/contact lenses. Not Present- Earache, Hearing Loss, Hoarseness, Nose Bleed, Oral Ulcers, Ringing in the Ears, Seasonal Allergies, Sinus Pain, Sore Throat, Visual Disturbances and Yellow Eyes. Respiratory Present- Snoring. Not Present- Bloody sputum, Chronic Cough, Difficulty Breathing and Wheezing. Breast Not Present- Breast Mass, Breast Pain, Nipple Discharge and Skin Changes. Cardiovascular Not Present- Chest Pain, Difficulty Breathing Lying Down, Leg Cramps, Palpitations, Rapid Heart Rate, Shortness of Breath and Swelling of Extremities. Gastrointestinal Present- Bloody Stool and Hemorrhoids. Not Present- Abdominal Pain, Bloating, Change in Bowel Habits, Chronic diarrhea,  Constipation, Difficulty Swallowing, Excessive gas, Gets full quickly at meals, Indigestion, Nausea, Rectal Pain and Vomiting. Female Genitourinary Present- Urgency. Not Present- Frequency, Nocturia, Painful Urination and Pelvic Pain. Musculoskeletal Present- Joint Stiffness. Not Present- Back Pain, Joint Pain, Muscle Pain, Muscle Weakness and Swelling of Extremities. Neurological Not Present- Decreased Memory, Fainting, Headaches, Numbness, Seizures, Tingling, Tremor, Trouble walking and Weakness. Psychiatric Not  Present- Anxiety, Bipolar, Change in Sleep Pattern, Depression, Fearful and Frequent crying. Endocrine Present- Heat Intolerance. Not Present- Cold Intolerance, Excessive Hunger, Hair Changes, Hot flashes and New Diabetes. Hematology Present- Easy Bruising. Not Present- Blood Thinners, Excessive bleeding, Gland problems, HIV and Persistent Infections.  Vitals (Chanel Nolan CMA; 07/20/2020 10:11 AM) 07/20/2020 10:11 AM Weight: 244.5 lb Height: 66.75in Body Surface Area: 2.2 m Body Mass Index: 38.58 kg/m  Temp.: 97.60F  Pulse: 98 (Regular)  BP: 122/80(Sitting, Left Arm, Standard)  Physical Exam Leighton Ruff MD; 0/27/2536 10:29 AM)  General Mental Status-Alert. General Appearance-Cooperative.  Rectal Anorectal Exam External - skin tag. Internal - normal sphincter tone.    Other: Procedure: Anoscopy....[name] KitchenMarland KitchenSurgeon: Marcello Moores....[name] KitchenMarland KitchenAfter the risks and benefits were explained, verbal consent was obtained for above procedure. A medical assistant chaperone was present thoroughout the entire procedure. ....[name] KitchenMarland KitchenAnesthesia: none....[name] KitchenMarland KitchenDiagnosis: rectal bleeding....[name] KitchenMarland KitchenFindings: Grade 3 right posterior hemorrhoid with signs of ulceration, grade 2, right anterior hemorrhoid. Also, with signs of ulceration. Grade 1 left lateral hemorrhoid  Performed: 07/20/2020 10:28 AM    Assessment & Plan Leighton Ruff MD; 6/44/0347 10:30 AM)  PROLAPSED INTERNAL HEMORRHOIDS,  GRADE 3 (Q25.9) Impression: 69 year old female who presents to the office with ongoing rectal bleeding and grade 2 and 3 internal hemorrhoids. We discussed complete hemorrhoidectomy versus transient hemorrhoidal dearterialization. I do not think she would be a good candidate for rubber band ligation in the office. After complete discussion, she has decided to proceed with transsphenoidal dearterialization. We have discussed the typical risk of surgery which include pain, bleeding and recurrence. We also discussed the typical post procedure recovery. All questions were answered.

## 2020-07-27 ENCOUNTER — Other Ambulatory Visit (HOSPITAL_COMMUNITY)
Admission: RE | Admit: 2020-07-27 | Discharge: 2020-07-27 | Disposition: A | Discharge status: Home / Self Care | Payer: Medicare Other | Source: Ambulatory Visit | Attending: General Surgery | Admitting: General Surgery

## 2020-07-27 ENCOUNTER — Encounter (HOSPITAL_BASED_OUTPATIENT_CLINIC_OR_DEPARTMENT_OTHER): Payer: Self-pay | Admitting: General Surgery

## 2020-07-27 ENCOUNTER — Other Ambulatory Visit: Payer: Self-pay

## 2020-07-27 DIAGNOSIS — Z01812 Encounter for preprocedural laboratory examination: Secondary | ICD-10-CM | POA: Insufficient documentation

## 2020-07-27 DIAGNOSIS — Z20822 Contact with and (suspected) exposure to covid-19: Secondary | ICD-10-CM | POA: Insufficient documentation

## 2020-07-27 LAB — SARS CORONAVIRUS 2 (TAT 6-24 HRS): SARS Coronavirus 2: NEGATIVE

## 2020-07-27 NOTE — Progress Notes (Addendum)
Spoke w/ via phone for pre-op interview---pt Lab needs dos----  none            Lab results------ct cardiac scoring 09-25-2019, sleep study 11-13-2017 COVID test ------07-27-2020 855 am Arrive at -------645 am 07-30-2020 NPO after MN NO Solid Food.  Clear liquids from MN until---545 am then npo Medications to take morning of surgery -----pantaprazole Diabetic medication -----n/a Patient Special Instructions -----bring cpap mask machine and tubing and leave in car Pre-Op special Istructions -----none Patient verbalized understanding of instructions that were given at this phone interview. Patient denies shortness of breath, chest pain, fever, cough at this phone interview.

## 2020-07-29 ENCOUNTER — Encounter: Payer: Self-pay | Admitting: Internal Medicine

## 2020-07-30 ENCOUNTER — Ambulatory Visit (HOSPITAL_BASED_OUTPATIENT_CLINIC_OR_DEPARTMENT_OTHER): Payer: Medicare Other | Admitting: Certified Registered Nurse Anesthetist

## 2020-07-30 ENCOUNTER — Encounter (HOSPITAL_BASED_OUTPATIENT_CLINIC_OR_DEPARTMENT_OTHER)
Admission: RE | Disposition: A | Discharge status: Home / Self Care | Payer: Self-pay | Source: Home / Self Care | Attending: General Surgery

## 2020-07-30 ENCOUNTER — Ambulatory Visit (HOSPITAL_BASED_OUTPATIENT_CLINIC_OR_DEPARTMENT_OTHER)
Admission: RE | Admit: 2020-07-30 | Discharge: 2020-07-30 | Disposition: A | Discharge status: Home / Self Care | Payer: Medicare Other | Attending: General Surgery | Admitting: General Surgery

## 2020-07-30 ENCOUNTER — Encounter (HOSPITAL_BASED_OUTPATIENT_CLINIC_OR_DEPARTMENT_OTHER): Payer: Self-pay | Admitting: General Surgery

## 2020-07-30 ENCOUNTER — Other Ambulatory Visit: Payer: Self-pay

## 2020-07-30 DIAGNOSIS — Z87442 Personal history of urinary calculi: Secondary | ICD-10-CM | POA: Insufficient documentation

## 2020-07-30 DIAGNOSIS — Z9989 Dependence on other enabling machines and devices: Secondary | ICD-10-CM | POA: Diagnosis not present

## 2020-07-30 DIAGNOSIS — Z809 Family history of malignant neoplasm, unspecified: Secondary | ICD-10-CM | POA: Diagnosis not present

## 2020-07-30 DIAGNOSIS — Z7982 Long term (current) use of aspirin: Secondary | ICD-10-CM | POA: Diagnosis not present

## 2020-07-30 DIAGNOSIS — M1990 Unspecified osteoarthritis, unspecified site: Secondary | ICD-10-CM | POA: Insufficient documentation

## 2020-07-30 DIAGNOSIS — Z9049 Acquired absence of other specified parts of digestive tract: Secondary | ICD-10-CM | POA: Insufficient documentation

## 2020-07-30 DIAGNOSIS — Z90710 Acquired absence of both cervix and uterus: Secondary | ICD-10-CM | POA: Diagnosis not present

## 2020-07-30 DIAGNOSIS — Z8249 Family history of ischemic heart disease and other diseases of the circulatory system: Secondary | ICD-10-CM | POA: Diagnosis not present

## 2020-07-30 DIAGNOSIS — K219 Gastro-esophageal reflux disease without esophagitis: Secondary | ICD-10-CM | POA: Insufficient documentation

## 2020-07-30 DIAGNOSIS — Z8541 Personal history of malignant neoplasm of cervix uteri: Secondary | ICD-10-CM | POA: Diagnosis not present

## 2020-07-30 DIAGNOSIS — G4730 Sleep apnea, unspecified: Secondary | ICD-10-CM | POA: Diagnosis not present

## 2020-07-30 DIAGNOSIS — Z79899 Other long term (current) drug therapy: Secondary | ICD-10-CM | POA: Insufficient documentation

## 2020-07-30 DIAGNOSIS — Z803 Family history of malignant neoplasm of breast: Secondary | ICD-10-CM | POA: Insufficient documentation

## 2020-07-30 DIAGNOSIS — Z833 Family history of diabetes mellitus: Secondary | ICD-10-CM | POA: Diagnosis not present

## 2020-07-30 DIAGNOSIS — N319 Neuromuscular dysfunction of bladder, unspecified: Secondary | ICD-10-CM | POA: Insufficient documentation

## 2020-07-30 DIAGNOSIS — Z8349 Family history of other endocrine, nutritional and metabolic diseases: Secondary | ICD-10-CM | POA: Diagnosis not present

## 2020-07-30 DIAGNOSIS — E669 Obesity, unspecified: Secondary | ICD-10-CM | POA: Insufficient documentation

## 2020-07-30 DIAGNOSIS — Z8261 Family history of arthritis: Secondary | ICD-10-CM | POA: Diagnosis not present

## 2020-07-30 DIAGNOSIS — Z823 Family history of stroke: Secondary | ICD-10-CM | POA: Diagnosis not present

## 2020-07-30 DIAGNOSIS — Z880 Allergy status to penicillin: Secondary | ICD-10-CM | POA: Insufficient documentation

## 2020-07-30 DIAGNOSIS — K642 Third degree hemorrhoids: Secondary | ICD-10-CM | POA: Diagnosis not present

## 2020-07-30 DIAGNOSIS — Z8711 Personal history of peptic ulcer disease: Secondary | ICD-10-CM | POA: Insufficient documentation

## 2020-07-30 DIAGNOSIS — Z6832 Body mass index (BMI) 32.0-32.9, adult: Secondary | ICD-10-CM | POA: Diagnosis not present

## 2020-07-30 DIAGNOSIS — K625 Hemorrhage of anus and rectum: Secondary | ICD-10-CM | POA: Diagnosis not present

## 2020-07-30 DIAGNOSIS — R011 Cardiac murmur, unspecified: Secondary | ICD-10-CM | POA: Diagnosis not present

## 2020-07-30 DIAGNOSIS — E785 Hyperlipidemia, unspecified: Secondary | ICD-10-CM | POA: Diagnosis not present

## 2020-07-30 DIAGNOSIS — Z881 Allergy status to other antibiotic agents status: Secondary | ICD-10-CM | POA: Diagnosis not present

## 2020-07-30 DIAGNOSIS — G4733 Obstructive sleep apnea (adult) (pediatric): Secondary | ICD-10-CM | POA: Diagnosis not present

## 2020-07-30 HISTORY — DX: Other chronic pain: G89.29

## 2020-07-30 HISTORY — DX: Sleep apnea, unspecified: G47.30

## 2020-07-30 HISTORY — DX: Low back pain, unspecified: M54.50

## 2020-07-30 HISTORY — DX: Unspecified hemorrhoids: K64.9

## 2020-07-30 HISTORY — PX: TRANSANAL HEMORRHOIDAL DEARTERIALIZATION: SHX6136

## 2020-07-30 HISTORY — DX: Unspecified urinary incontinence: R32

## 2020-07-30 SURGERY — TRANSANAL HEMORRHOIDAL DEARTERIALIZATION
Anesthesia: Monitor Anesthesia Care | Site: Rectum

## 2020-07-30 MED ORDER — PROPOFOL 10 MG/ML IV BOLUS
INTRAVENOUS | Status: AC
  Filled 2020-07-30: qty 20

## 2020-07-30 MED ORDER — OXYCODONE HCL 5 MG PO TABS
ORAL_TABLET | ORAL | Status: AC
  Filled 2020-07-30: qty 1

## 2020-07-30 MED ORDER — PHENYLEPHRINE HCL (PRESSORS) 10 MG/ML IV SOLN
INTRAVENOUS | Status: DC | PRN
  Administered 2020-07-30 (×2): 40 ug via INTRAVENOUS

## 2020-07-30 MED ORDER — KETOROLAC TROMETHAMINE 15 MG/ML IJ SOLN: 15.0000 mg | Freq: Once | INTRAMUSCULAR | Status: DC

## 2020-07-30 MED ORDER — PROPOFOL 500 MG/50ML IV EMUL
INTRAVENOUS | Status: AC
  Filled 2020-07-30: qty 50

## 2020-07-30 MED ORDER — LIDOCAINE 2% (20 MG/ML) 5 ML SYRINGE
INTRAMUSCULAR | Status: DC | PRN
  Administered 2020-07-30: 50 mg via INTRAVENOUS

## 2020-07-30 MED ORDER — SODIUM CHLORIDE 0.9% FLUSH: 3.0000 mL | INTRAVENOUS | Status: DC | PRN

## 2020-07-30 MED ORDER — GABAPENTIN 300 MG PO CAPS
ORAL_CAPSULE | ORAL | Status: AC
  Filled 2020-07-30: qty 1

## 2020-07-30 MED ORDER — ACETAMINOPHEN 325 MG PO TABS: 325.0000 mg | ORAL_TABLET | ORAL | Status: DC | PRN

## 2020-07-30 MED ORDER — FENTANYL CITRATE (PF) 100 MCG/2ML IJ SOLN: 25.0000 ug | INTRAMUSCULAR | Status: DC | PRN

## 2020-07-30 MED ORDER — FENTANYL CITRATE (PF) 250 MCG/5ML IJ SOLN
INTRAMUSCULAR | Status: DC | PRN
Start: 2020-07-30 — End: 2020-07-30
  Administered 2020-07-30: 50 ug via INTRAVENOUS

## 2020-07-30 MED ORDER — FENTANYL CITRATE (PF) 100 MCG/2ML IJ SOLN
INTRAMUSCULAR | Status: AC
  Filled 2020-07-30: qty 2

## 2020-07-30 MED ORDER — SODIUM CHLORIDE 0.9 % IV SOLN: 250.0000 mL | INTRAVENOUS | Status: DC | PRN

## 2020-07-30 MED ORDER — LACTATED RINGERS IV SOLN: INTRAVENOUS | Status: DC

## 2020-07-30 MED ORDER — DEXMEDETOMIDINE (PRECEDEX) IN NS 20 MCG/5ML (4 MCG/ML) IV SYRINGE
PREFILLED_SYRINGE | INTRAVENOUS | Status: DC | PRN
  Administered 2020-07-30: 4 ug via INTRAVENOUS

## 2020-07-30 MED ORDER — GABAPENTIN 300 MG PO CAPS
300.0000 mg | ORAL_CAPSULE | ORAL | Status: AC
  Administered 2020-07-30: 300 mg via ORAL

## 2020-07-30 MED ORDER — BUPIVACAINE LIPOSOME 1.3 % IJ SUSP
INTRAMUSCULAR | Status: DC | PRN
  Administered 2020-07-30: 20 mL

## 2020-07-30 MED ORDER — ACETAMINOPHEN 325 MG PO TABS: 650.0000 mg | ORAL_TABLET | ORAL | Status: DC | PRN

## 2020-07-30 MED ORDER — BUPIVACAINE-EPINEPHRINE 0.5% -1:200000 IJ SOLN
INTRAMUSCULAR | Status: DC | PRN
  Administered 2020-07-30: 30 mL

## 2020-07-30 MED ORDER — BUPIVACAINE LIPOSOME 1.3 % IJ SUSP: 20.0000 mL | Freq: Once | INTRAMUSCULAR | Status: DC

## 2020-07-30 MED ORDER — PHENYLEPHRINE 40 MCG/ML (10ML) SYRINGE FOR IV PUSH (FOR BLOOD PRESSURE SUPPORT)
PREFILLED_SYRINGE | INTRAVENOUS | Status: AC
  Filled 2020-07-30: qty 10

## 2020-07-30 MED ORDER — ACETAMINOPHEN 500 MG PO TABS
1000.0000 mg | ORAL_TABLET | ORAL | Status: AC
  Administered 2020-07-30: 1000 mg via ORAL

## 2020-07-30 MED ORDER — DEXMEDETOMIDINE (PRECEDEX) IN NS 20 MCG/5ML (4 MCG/ML) IV SYRINGE
PREFILLED_SYRINGE | INTRAVENOUS | Status: AC
  Filled 2020-07-30: qty 5

## 2020-07-30 MED ORDER — DIAZEPAM 5 MG PO TABS
ORAL_TABLET | ORAL | Status: AC
  Filled 2020-07-30: qty 1

## 2020-07-30 MED ORDER — PROPOFOL 500 MG/50ML IV EMUL
INTRAVENOUS | Status: DC | PRN
  Administered 2020-07-30: 200 ug/kg/min via INTRAVENOUS

## 2020-07-30 MED ORDER — ACETAMINOPHEN 325 MG RE SUPP: 650.0000 mg | RECTAL | Status: DC | PRN

## 2020-07-30 MED ORDER — ACETAMINOPHEN 500 MG PO TABS
ORAL_TABLET | ORAL | Status: AC
  Filled 2020-07-30: qty 2

## 2020-07-30 MED ORDER — OXYCODONE HCL 5 MG PO TABS: 5.0000 mg | ORAL_TABLET | ORAL | Status: DC | PRN

## 2020-07-30 MED ORDER — OXYCODONE HCL 5 MG PO TABS: 5.0000 mg | ORAL_TABLET | Freq: Four times a day (QID) | ORAL | 0 refills | Status: DC | PRN

## 2020-07-30 MED ORDER — OXYCODONE HCL 5 MG PO TABS
5.0000 mg | ORAL_TABLET | Freq: Once | ORAL | Status: AC | PRN
  Administered 2020-07-30: 5 mg via ORAL

## 2020-07-30 MED ORDER — OXYCODONE HCL 5 MG/5ML PO SOLN: 5.0000 mg | Freq: Once | ORAL | Status: AC | PRN

## 2020-07-30 MED ORDER — DIAZEPAM 5 MG PO TABS
5.0000 mg | ORAL_TABLET | Freq: Once | ORAL | Status: AC
  Administered 2020-07-30: 5 mg via ORAL

## 2020-07-30 MED ORDER — LIDOCAINE 2% (20 MG/ML) 5 ML SYRINGE
INTRAMUSCULAR | Status: AC
  Filled 2020-07-30: qty 5

## 2020-07-30 MED ORDER — ACETAMINOPHEN 160 MG/5ML PO SOLN: 325.0000 mg | ORAL | Status: DC | PRN

## 2020-07-30 MED ORDER — SODIUM CHLORIDE 0.9% FLUSH: 3.0000 mL | Freq: Two times a day (BID) | INTRAVENOUS | Status: DC

## 2020-07-30 SURGICAL SUPPLY — 40 items
BLADE HEX COATED 2.75 (ELECTRODE) IMPLANT
BRIEF STRETCH FOR OB PAD LRG (UNDERPADS AND DIAPERS) IMPLANT
COVER BACK TABLE 60X90IN (DRAPES) ×2 IMPLANT
COVER WAND RF STERILE (DRAPES) ×2 IMPLANT
DECANTER SPIKE VIAL GLASS SM (MISCELLANEOUS) IMPLANT
DRAPE HYSTEROSCOPY (MISCELLANEOUS) ×2 IMPLANT
DRAPE SHEET LG 3/4 BI-LAMINATE (DRAPES) ×2 IMPLANT
DRSG PAD ABDOMINAL 8X10 ST (GAUZE/BANDAGES/DRESSINGS) ×2 IMPLANT
ELECT REM PT RETURN 9FT ADLT (ELECTROSURGICAL) ×2
ELECTRODE REM PT RTRN 9FT ADLT (ELECTROSURGICAL) ×1 IMPLANT
GAUZE SPONGE 4X4 12PLY STRL (GAUZE/BANDAGES/DRESSINGS) ×1 IMPLANT
GLOVE BIO SURGEON STRL SZ 6.5 (GLOVE) ×2 IMPLANT
GLOVE BIOGEL PI IND STRL 6.5 (GLOVE) IMPLANT
GLOVE BIOGEL PI IND STRL 7.0 (GLOVE) ×1 IMPLANT
GLOVE BIOGEL PI IND STRL 7.5 (GLOVE) IMPLANT
GLOVE BIOGEL PI INDICATOR 6.5 (GLOVE) ×1
GLOVE BIOGEL PI INDICATOR 7.0 (GLOVE) ×1
GLOVE BIOGEL PI INDICATOR 7.5 (GLOVE) ×1
GLOVE ECLIPSE 6.5 STRL STRAW (GLOVE) ×1 IMPLANT
GOWN STRL REUS W/TWL LRG LVL3 (GOWN DISPOSABLE) ×1 IMPLANT
GOWN STRL REUS W/TWL XL LVL3 (GOWN DISPOSABLE) ×2 IMPLANT
HEMOSTAT SURGICEL 4X8 (HEMOSTASIS) IMPLANT
KIT SIGMOIDOSCOPE (SET/KITS/TRAYS/PACK) IMPLANT
KIT SLIDE ONE PROLAPS HEMORR (KITS) ×2 IMPLANT
KIT TURNOVER CYSTO (KITS) ×2 IMPLANT
LEGGING LITHOTOMY PAIR STRL (DRAPES) ×2 IMPLANT
LUBRICANT JELLY K Y 4OZ (MISCELLANEOUS) ×2 IMPLANT
NEEDLE HYPO 22GX1.5 SAFETY (NEEDLE) ×2 IMPLANT
PACK BASIN DAY SURGERY FS (CUSTOM PROCEDURE TRAY) ×2 IMPLANT
PAD ARMBOARD 7.5X6 YLW CONV (MISCELLANEOUS) IMPLANT
PENCIL SMOKE EVACUATOR (MISCELLANEOUS) ×2 IMPLANT
SPONGE HEMORRHOID 8X3CM (HEMOSTASIS) IMPLANT
SUT CHROMIC 2 0 SH (SUTURE) IMPLANT
SUT CHROMIC 3 0 SH 27 (SUTURE) IMPLANT
SUT VIC AB 2-0 UR6 27 (SUTURE) IMPLANT
SYR CONTROL 10ML LL (SYRINGE) ×3 IMPLANT
TOWEL OR 17X26 10 PK STRL BLUE (TOWEL DISPOSABLE) ×2 IMPLANT
TRAY DSU PREP LF (CUSTOM PROCEDURE TRAY) ×2 IMPLANT
TUBE CONNECTING 12X1/4 (SUCTIONS) ×2 IMPLANT
YANKAUER SUCT BULB TIP NO VENT (SUCTIONS) ×2 IMPLANT

## 2020-07-30 NOTE — Anesthesia Preprocedure Evaluation (Signed)
Anesthesia Evaluation    History of Anesthesia Complications (+) PONV and history of anesthetic complications  Airway Mallampati: I  TM Distance: >3 FB Neck ROM: Full    Dental no notable dental hx.    Pulmonary sleep apnea and Continuous Positive Airway Pressure Ventilation ,    Pulmonary exam normal        Cardiovascular Normal cardiovascular exam     Neuro/Psych negative psych ROS   GI/Hepatic GERD  Medicated and Controlled,  Endo/Other    Renal/GU  Bladder dysfunction      Musculoskeletal   Abdominal (+) + obese,   Peds  Hematology   Anesthesia Other Findings   Reproductive/Obstetrics                             Anesthesia Physical Anesthesia Plan  ASA: II  Anesthesia Plan: MAC   Post-op Pain Management:    Induction:   PONV Risk Score and Plan: 4 or greater and Ondansetron, Dexamethasone, Midazolam, Propofol infusion and TIVA  Airway Management Planned: Mask, Natural Airway, Nasal Cannula and Simple Face Mask  Additional Equipment: None  Intra-op Plan:   Post-operative Plan:   Informed Consent: I have reviewed the patients History and Physical, chart, labs and discussed the procedure including the risks, benefits and alternatives for the proposed anesthesia with the patient or authorized representative who has indicated his/her understanding and acceptance.       Plan Discussed with: CRNA  Anesthesia Plan Comments:         Anesthesia Quick Evaluation

## 2020-07-30 NOTE — Anesthesia Postprocedure Evaluation (Signed)
Anesthesia Post Note  Patient: [name]  Procedure(s) Performed: TRANSANAL HEMORRHOIDAL DEARTERIALIZATION (N/A Rectum)     Patient location during evaluation: Phase II Anesthesia Type: MAC Level of consciousness: awake Pain management: pain level controlled Vital Signs Assessment: post-procedure vital signs reviewed and stable Respiratory status: spontaneous breathing Cardiovascular status: stable Postop Assessment: no apparent nausea or vomiting Anesthetic complications: no   No complications documented.  Last Vitals:  Vitals:   07/30/20 0915 07/30/20 1020  BP: 125/74 (!) 145/78  Pulse: 71 62  Resp: 11 16  Temp:    SpO2: 99% 98%    Last Pain:  Vitals:   07/30/20 1020  TempSrc: Oral  PainSc: 4    Pain Goal: Patients Stated Pain Goal: 3 (07/30/20 0709)                 Huston Foley

## 2020-07-30 NOTE — Op Note (Signed)
07/30/2020  9:02 AM  PATIENT:  [name]  69 y.o. female  Patient Care Team: Biagio Borg, MD as PCP - General (Internal Medicine)  PRE-OPERATIVE DIAGNOSIS:  GRADE 3 HEMORRHOIDS  POST-OPERATIVE DIAGNOSIS:  GRADE 3 HEMORRHOIDS  PROCEDURE:   TRANSANAL HEMORRHOIDAL DEARTERIALIZATION    Surgeon(s): Leighton Ruff, MD  ASSISTANT: none   ANESTHESIA:   local and MAC  EBL:  Total I/O In: -  Out: 5 [Blood:5]  DRAINS: none   SPECIMEN:  No Specimen  DISPOSITION OF SPECIMEN:  N/A  COUNTS:  YES  PLAN OF CARE: Discharge to home after PACU  PATIENT DISPOSITION:  PACU - hemodynamically stable.  INDICATION: 69 y.o. F with rectal bleeding and grade 3 hemorrhoids   OR FINDINGS: Grade 3 RP & RA hemorrhoids, Grade 2 LL  Description: Informed consent was confirmed. Patient underwent general anesthesia without difficulty. Patient was placed into lithotomy positioning.  The perianal region was prepped and draped in sterile fashion. Surgical time out confirmed or plan.  I performed a rectal block using half percent Marcaine with epinephrine mixed with Exparel.  I did digital rectal examination and then transitioned over to anoscopy to get a sense of the anatomy.  I switched over to the [facility] fiberoptically lit Doppler anocope.   Using the Doppler on the tip of the Bier anoscope, I identified the arterial hemorrhoidal vessels coming in in the classic hexagonal anatomical pattern  (right posterior/lateral/anterior, left posterior /lateral/anterior).    I proceeded to ligate the hemorrhoidal arteries. I used a 2-0 Vicryl suture on a UR-6 needle in a figure-of-eight fashion over the signal around 6 cm proximal to the anal verge. I then ran that stitch longitudinally more distally to the dentate line. I then tied that stitch down to cause a hemorrhoidopexy. I did that for all 6 locations.    I redid Doppler anoscopy.  I identified no other signals within the anal canal.  At completion of this, all  hemorrhoids were reduced into the rectum.  There is no more prolapse. External anatomy looked normal.  I repeated anoscopy and examination.   Hemostasis was good.  Patient is being extubated go to recovery room.  I am about to discuss the patient's status to the family.

## 2020-07-30 NOTE — Discharge Instructions (Addendum)
ANORECTAL SURGERY: POST OP INSTRUCTIONS 1. Take your usually prescribed home medications unless otherwise directed. 2. DIET: During the first few hours after surgery sip on some liquids until you are able to urinate.  It is normal to not urinate for several hours after this surgery.  If you feel uncomfortable, please contact the office for instructions.  After you are able to urinate,you may eat, if you feel like it.  Follow a light bland diet the first 24 hours after arrival home, such as soup, liquids, crackers, etc.  Be sure to include lots of fluids daily (6-8 glasses).  Avoid fast food or heavy meals, as your are more likely to get nauseated.  Eat a low fat diet the next few days after surgery.  Limit caffeine intake to 1-2 servings a day. 3. PAIN CONTROL: a. Pain is best controlled by a usual combination of several different methods TOGETHER: i. Muscle relaxation: Soak in a warm bath (or Sitz bath) three times a day and after bowel movements.  Continue to do this until all pain is resolved. ii. Over the counter pain medication iii. Prescription pain medication b. Most patients will experience some swelling and discomfort in the anus/rectal area and incisions.  Heat such as warm towels, sitz baths, warm baths, etc to help relax tight/sore spots and speed recovery.  Some people prefer to use ice, especially in the first couple days after surgery, as it may decrease the pain and swelling, or alternate between ice & heat.  Experiment to what works for you.  Swelling and bruising can take several weeks to resolve.  Pain can take even longer to completely resolve. c. It is helpful to take an over-the-counter pain medication regularly for the first few weeks.  Choose one of the following that works best for you: i. Naproxen (Aleve, etc)  Two 220mg tabs twice a day ii. Ibuprofen (Advil, etc) Three 200mg tabs four times a day (every meal & bedtime) d. A  prescription for pain medication (such as percocet,  oxycodone, hydrocodone, etc) should be given to you upon discharge.  Take your pain medication as prescribed.  i. If you are having problems/concerns with the prescription medicine (does not control pain, nausea, vomiting, rash, itching, etc), please call us [phone] to see if we need to switch you to a different pain medicine that will work better for you and/or control your side effect better. ii. If you need a refill on your pain medication, please contact your pharmacy.  They will contact our office to request authorization. Prescriptions will not be filled after 5 pm or on week-ends. 4. KEEP YOUR BOWELS REGULAR and AVOID CONSTIPATION a. The goal is one to two soft bowel movements a day.  You should at least have a bowel movement every other day. b. Avoid getting constipated.  Between the surgery and the pain medications, it is common to experience some constipation. This can be very painful after rectal surgery.  Increasing fluid intake and taking a fiber supplement (such as Metamucil, Citrucel, FiberCon, etc) 1-2 times a day regularly will usually help prevent this problem from occurring.  A stool softener like colace is also recommended.  This can be purchased over the counter at your pharmacy.  You can take it up to 3 times a day.  If you do not have a bowel movement after 24 hrs since your surgery, take one does of milk of magnesia.  If you still haven't had a bowel movement 8-12 hours after   that dose, take another dose.  If you don't have a bowel movement 48 hrs after surgery, purchase a Fleets enema from the drug store and administer gently per package instructions.  If you still are having trouble with your bowel movements after that, please call the office for further instructions. c. If you develop diarrhea or have many loose bowel movements, simplify your diet to bland foods & liquids for a few days.  Stop any stool softeners and decrease your fiber supplement.  Switching to mild  anti-diarrheal medications (Kayopectate, Pepto Bismol) can help.  If this worsens or does not improve, please call us.  5. Wound Care a. Remove your bandages before your first bowel movement or 8 hours after surgery.     b. Remove any wound packing material at this tim,e as well.  You do not need to repack the wound unless instructed otherwise.  Wear an absorbent pad or soft cotton gauze in your underwear to catch any drainage and help keep the area clean. You should change this every 2-3 hours while awake. c. Keep the area clean and dry.  Bathe / shower every day, especially after bowel movements.  Keep the area clean by showering / bathing over the incision / wound.   It is okay to soak an open wound to help wash it.  Wet wipes or showers / gentle washing after bowel movements is often less traumatic than regular toilet paper. d. You may have some styrofoam-like soft packing in the rectum which will come out with the first bowel movement.  e. You will often notice bleeding with bowel movements.  This should slow down by the end of the first week of surgery f. Expect some drainage.  This should slow down, too, by the end of the first week of surgery.  Wear an absorbent pad or soft cotton gauze in your underwear until the drainage stops. g. Do Not sit on a rubber or pillow ring.  This can make you symptoms worse.  You may sit on a soft pillow if needed.  6. ACTIVITIES as tolerated:   a. You may resume regular (light) daily activities beginning the next day--such as daily self-care, walking, climbing stairs--gradually increasing activities as tolerated.  If you can walk 30 minutes without difficulty, it is safe to try more intense activity such as jogging, treadmill, bicycling, low-impact aerobics, swimming, etc. b. Save the most intensive and strenuous activity for last such as sit-ups, heavy lifting, contact sports, etc  Refrain from any heavy lifting or straining until you are off narcotics for pain  control.   c. You may drive when you are no longer taking prescription pain medication, you can comfortably sit for long periods of time, and you can safely maneuver your car and apply brakes. d. You may have sexual intercourse when it is comfortable.  7. FOLLOW UP in our office a. Please call [facility] at [phone] to set up an appointment to see your surgeon in the office for a follow-up appointment approximately 3-4 weeks after your surgery. b. Make sure that you call for this appointment the day you arrive home to insure a convenient appointment time. 10. IF YOU HAVE DISABILITY OR FAMILY LEAVE FORMS, BRING THEM TO THE OFFICE FOR PROCESSING.  DO NOT GIVE THEM TO YOUR DOCTOR.     WHEN TO CALL US [phone]: 1. Poor pain control 2. Reactions / problems with new medications (rash/itching, nausea, etc)  3. Fever over 101.5 F (38.5 C) 4.   Inability to urinate 5. Nausea and/or vomiting 6. Worsening swelling or bruising 7. Continued bleeding from incision. 8. Increased pain, redness, or drainage from the incision  The clinic staff is available to answer your questions during regular business hours (8:30am-5pm).  Please don't hesitate to call and ask to speak to one of our nurses for clinical concerns.   A surgeon from [facility] is always on call at the hospitals   If you have a medical emergency, go to the nearest emergency room or call 911.    [facility], [place], [place], [place], [place]  [id number] ? MAIN: [phone] ? TOLL FREE: [phone] ? FAX [phone] [email or web address]  Information for Discharge Teaching: EXPAREL (bupivacaine liposome injectable suspension)   Your surgeon gave you EXPAREL(bupivacaine) in your surgical incision to help control your pain after surgery.   EXPAREL is a local anesthetic that provides pain relief by numbing the tissue around the surgical site.  EXPAREL is designed to release  pain medication over time and can control pain for up to 72 hours.  Depending on how you respond to EXPAREL, you may require less pain medication during your recovery.  Possible side effects:  Temporary loss of sensation or ability to move in the area where bupivacaine was injected.  Nausea, vomiting, constipation  Rarely, numbness and tingling in your mouth or lips, lightheadedness, or anxiety may occur.  Call your doctor right away if you think you may be experiencing any of these sensations, or if you have other questions regarding possible side effects.  Follow all other discharge instructions given to you by your surgeon or nurse. Eat a healthy diet and drink plenty of water or other fluids.  If you return to the hospital for any reason within 96 hours following the administration of EXPAREL, please inform your health care providers. Post Anesthesia Home Care Instructions  Activity: Get plenty of rest for the remainder of the day. A responsible adult should stay with you for 24 hours following the procedure.  For the next 24 hours, DO NOT: -Drive a car -Paediatric nurse -Drink alcoholic beverages -Take any medication unless instructed by your physician -Make any legal decisions or sign important papers.  Meals: Start with liquid foods such as gelatin or soup. Progress to regular foods as tolerated. Avoid greasy, spicy, heavy foods. If nausea and/or vomiting occur, drink only clear liquids until the nausea and/or vomiting subsides. Call your physician if vomiting continues.  Special Instructions/Symptoms: Your throat may feel dry or sore from the anesthesia or the breathing tube placed in your throat during surgery. If this causes discomfort, gargle with warm salt water. The discomfort should disappear within 24 hours.  If you had a scopolamine patch placed behind your ear for the management of post- operative nausea and/or vomiting:  1. The medication in the patch is  effective for 72 hours, after which it should be removed.  Wrap patch in a tissue and discard in the trash. Wash hands thoroughly with soap and water. 2. You may remove the patch earlier than 72 hours if you experience unpleasant side effects which may include dry mouth, dizziness or visual disturbances. 3. Avoid touching the patch. Wash your hands with soap and water after contact with the patch.

## 2020-07-30 NOTE — Transfer of Care (Signed)
Immediate Anesthesia Transfer of Care Note  Patient: [name]  Procedure(s) Performed: TRANSANAL HEMORRHOIDAL DEARTERIALIZATION (N/A Rectum)  Patient Location: PACU  Anesthesia Type:MAC  Level of Consciousness: awake, alert  and oriented  Airway & Oxygen Therapy: Patient Spontanous Breathing and Patient connected to nasal cannula oxygen  Post-op Assessment: Report given to RN and Post -op Vital signs reviewed and stable  Post vital signs: Reviewed and stable  Last Vitals:  Vitals Value Taken Time  BP 128/67 07/30/20 0906  Temp    Pulse 79 07/30/20 0906  Resp 9 07/30/20 0907  SpO2 97 % 07/30/20 0906  Vitals shown include unvalidated device data.  Last Pain:  Vitals:   07/30/20 0709  TempSrc: Oral      Patients Stated Pain Goal: 3 (37/54/36 0677)  Complications: No complications documented.

## 2020-07-30 NOTE — Interval H&P Note (Signed)
History and Physical Interval Note:  07/30/2020 7:01 AM  [name]  has presented today for surgery, with the diagnosis of GRADE 3 HEMORRHOIDS.  The various methods of treatment have been discussed with the patient and family. After consideration of risks, benefits and other options for treatment, the patient has consented to  Procedure(s): TRANSANAL HEMORRHOIDAL DEARTERIALIZATION (N/A) as a surgical intervention.  The patient's history has been reviewed, patient examined, no change in status, stable for surgery.  I have reviewed the patient's chart and labs.  Questions were answered to the patient's satisfaction.     Rosario Adie, MD  [facility]

## 2020-07-31 ENCOUNTER — Encounter (HOSPITAL_BASED_OUTPATIENT_CLINIC_OR_DEPARTMENT_OTHER): Payer: Self-pay | Admitting: General Surgery

## 2020-08-06 ENCOUNTER — Other Ambulatory Visit: Payer: Self-pay

## 2020-08-06 ENCOUNTER — Encounter: Payer: Self-pay | Admitting: Internal Medicine

## 2020-08-06 ENCOUNTER — Ambulatory Visit (INDEPENDENT_AMBULATORY_CARE_PROVIDER_SITE_OTHER): Payer: Medicare Other | Admitting: Internal Medicine

## 2020-08-06 VITALS — BP 120/70 | HR 68 | Temp 98.0°F | Ht 72.5 in | Wt 239.0 lb

## 2020-08-06 DIAGNOSIS — G5621 Lesion of ulnar nerve, right upper limb: Secondary | ICD-10-CM | POA: Diagnosis not present

## 2020-08-06 DIAGNOSIS — I700 Atherosclerosis of aorta: Secondary | ICD-10-CM | POA: Diagnosis not present

## 2020-08-06 DIAGNOSIS — E559 Vitamin D deficiency, unspecified: Secondary | ICD-10-CM

## 2020-08-06 DIAGNOSIS — R7302 Impaired glucose tolerance (oral): Secondary | ICD-10-CM

## 2020-08-06 DIAGNOSIS — R202 Paresthesia of skin: Secondary | ICD-10-CM

## 2020-08-06 DIAGNOSIS — Z23 Encounter for immunization: Secondary | ICD-10-CM | POA: Diagnosis not present

## 2020-08-06 DIAGNOSIS — E785 Hyperlipidemia, unspecified: Secondary | ICD-10-CM | POA: Diagnosis not present

## 2020-08-06 NOTE — Patient Instructions (Addendum)
You had the flu shot today  Please continue all other medications as before, and refills have been done if requested.  Please have the pharmacy call with any other refills you may need.  Please continue your efforts at being more active, low cholesterol diet, and weight control.  You are otherwise up to date with prevention measures today.  Please keep your appointments with your specialists as you may have planned  Please go to the LAB at the blood drawing area for the tests to be done  You will be contacted by phone if any changes need to be made immediately.  Otherwise, you will receive a letter about your results with an explanation, but please check with MyChart first.  Please remember to sign up for MyChart if you have not done so, as this will be important to you in the future with finding out test results, communicating by private email, and scheduling acute appointments online when needed.  Please make an Appointment to return for your 1 year visit, or sooner if needed 

## 2020-08-06 NOTE — Progress Notes (Signed)
Subjective:    Patient ID: [name], female    DOB: [birth year], 69 y.o.   MRN: [id number]  HPI  Here for yearly f/u;  Overall doing ok;  Pt denies Chest pain, worsening SOB, DOE, wheezing, orthopnea, PND, worsening LE edema, palpitations, dizziness or syncope.  Pt denies neurological change such as new headache, facial or extremity weakness, but has mild 3 mo intermittent paresthesias to both feet sometimes up over the ankles it seems, some worse at night, not associated with worsening symptomatic back pain.  Also has moderate right intermittent right ulnar groove pain and soreness to touch without swelling or trauma, with radiation of burning numbness to the right wrist sometimes x 1 mo, and has been working with the arm with flexion/etension motion increased recenlty in the yard. Has no pain today  Pt denies polydipsia, polyuria, or low sugar symptoms. Pt states overall good compliance with treatment and medications, good tolerability, and has been trying to follow appropriate diet.  Pt denies worsening depressive symptoms, suicidal ideation or panic. No fever, night sweats, wt loss, loss of appetite, or other constitutional symptoms.  Pt states good ability with ADL's, has low fall risk, home safety reviewed and adequate, no other significant changes in hearing or vision, and occasionally active with exercise. Past Medical History:  Diagnosis Date  . Abnormal Pap smear of vagina 06/17/15   LGSIL  . Allergic rhinitis, cause unspecified   . Arthritis   . ARTHRITIS, KNEE 02/10/2009   Qualifier: Diagnosis of  By: Niel Hummer MD, Lorinda Creed   . BRCA negative 03/25/13  . Cervical cancer ([place]) 1984  . Cervical disc disease 02/11/2012  . Chronic lower back pain   . Degenerative arthritis of hip 02/11/2012  . Diverticulosis 02/11/2012  . GERD 08/10/2007   Qualifier: Diagnosis of  By: Sherlynn Stalls, CMA, [place]    . GERD (gastroesophageal reflux disease)   . GERD (gastroesophageal reflux disease)   . Hemorrhoids     . HEMORRHOIDS-INTERNAL 07/21/2010   Qualifier: Diagnosis of  By: Chester Holstein NP, Nevin Bloodgood    . Hiatal hernia   . Hyperlipidemia 02/13/2012  . Mitral valve prolapse    no cardiologist, mild  . OAB (overactive bladder)   . Obesity   . OVERACTIVE BLADDER 08/10/2007   Qualifier: Diagnosis of  By: Sherlynn Stalls, CMA, [place]    . Pneumonia 2017  . PONV (postoperative nausea and vomiting)    yrs ago unable to raise arms after female surgery, no recent problems  . Recurrent UTI   . Sleep apnea    uses cpap set on 2  . SUI (stress urinary incontinence, female)   . Urinary incontinence   . VAGINITIS, ATROPHIC 11/11/2008   Qualifier: Diagnosis of  By: Niel Hummer MD, Lorinda Creed    Past Surgical History:  Procedure Laterality Date  . bladder tack     x 3 or 4  . BUNIONECTOMY Right 2007  . CERVICAL DISCECTOMY  2002   fusion and plate C 3  . CHOLECYSTECTOMY N/A 08/13/2013   Procedure: LAPAROSCOPIC CHOLECYSTECTOMY WITH INTRAOPERATIVE CHOLANGIOGRAM;  Surgeon: Shann Medal, MD;  Location: [facility];  Service: General;  Laterality: N/A;  . CLOSED REDUCTION METATARSAL FRACTURE     rt side 06-03-18, left side 08-19-17  . COLONOSCOPY  2018  . conization of cervix  1982   with D&C  . CYSTOCELE REPAIR  2003   rectocele repair  . EYE SURGERY Bilateral yrs ago   lasik  . ORIF ANKLE  FRACTURE Right 08/20/2017   Procedure: OPEN REDUCTION INTERNAL FIXATION (ORIF) ANKLE FRACTURE;  Surgeon: Meredith Pel, MD;  Location: [facility];  Service: Orthopedics;  Laterality: Right;  . SHOULDER ARTHROSCOPY Right 2004   frozen shoulder  . TONSILLECTOMY  as child   adenoids removed  . TRANSANAL HEMORRHOIDAL DEARTERIALIZATION N/A 07/30/2020   Procedure: TRANSANAL HEMORRHOIDAL DEARTERIALIZATION;  Surgeon: Leighton Ruff, MD;  Location: [facility];  Service: General;  Laterality: N/A;  . VAGINAL HYSTERECTOMY  1986   partial    reports that she has never smoked. She has never used smokeless tobacco. She reports current  alcohol use. She reports that she does not use drugs. family history includes Breast cancer in her cousin, mother, and sister; Cancer in her maternal grandmother; Diabetes in her brother, maternal aunt, and maternal uncle; Heart disease in her brother and sister; Thyroid cancer in an other family member. Allergies  Allergen Reactions  . Amoxicillin     REACTION: unspecified  . Penicillins Other (See Comments)    Unknown reaction, mother had reaction almost died   Current Outpatient Medications on File Prior to Visit  Medication Sig Dispense Refill  . cholecalciferol (VITAMIN D3) 25 MCG (1000 UNIT) tablet Take 2,000 Units by mouth daily. 50 mcg daily    . estradiol (ESTRACE VAGINAL) 0.1 MG/GM vaginal cream 1 gram pv twice weekly 42.5 g 3  . MULTIPLE VITAMIN tablet Take 1 tablet by mouth daily.    . nabumetone (RELAFEN) 750 MG tablet TAKE 1 TABLET BY MOUTH TWICE A DAY AS NEEDED FOR ARTHRITIS (Patient taking differently: daily. TAKE 1 TABLET BY MOUTH TWICE A DAY AS NEEDED FOR ARTHRITIS) 180 tablet 3  . oxyCODONE (OXY IR/ROXICODONE) 5 MG immediate release tablet Take 1-2 tablets (5-10 mg total) by mouth every 6 (six) hours as needed. 30 tablet 0  . pantoprazole (PROTONIX) 40 MG tablet Take 1 tablet (40 mg total) by mouth daily. 90 tablet 1  . trimethoprim (TRIMPEX) 100 MG tablet 1 tab po daily for UTI prophylaxis 90 tablet 4   No current facility-administered medications on file prior to visit.   Review of Systems All otherwise neg per pt    Objective:   Physical Exam BP 120/70 (BP Location: Left Arm, Patient Position: Sitting, Cuff Size: Large)   Pulse 68   Temp 98 F (36.7 C) (Oral)   Ht 6' 0.5" (1.842 m)   Wt 239 lb (108.4 kg)   SpO2 94%   BMI 31.97 kg/m  VS noted,  Constitutional: Pt appears in NAD HENT: Head: NCAT.  Right Ear: External ear normal.  Left Ear: External ear normal.  Eyes: . Pupils are equal, round, and reactive to light. Conjunctivae and EOM are normal Nose:  without d/c or deformity Neck: Neck supple. Gross normal ROM Cardiovascular: Normal rate and regular rhythm.   Pulmonary/Chest: Effort normal and breath sounds without rales or wheezing.  Abd:  Soft, NT, ND, + BS, no organomegaly Neurological: Pt is alert. At baseline orientation, motor grossly intact Skin: Skin is warm. No rashes, other new lesions, no LE edema Psychiatric: Pt behavior is normal without agitation  All otherwise neg per pt Lab Results  Component Value Date   WBC 9.9 08/06/2020   HGB 13.1 08/06/2020   HCT 39.5 08/06/2020   PLT 343 08/06/2020   GLUCOSE 86 08/06/2020   CHOL 169 08/06/2020   TRIG 120 08/06/2020   HDL 43 (L) 08/06/2020   LDLDIRECT 135.1 05/08/2012   LDLCALC 104 (H)  08/06/2020   ALT 37 (H) 08/06/2020   AST 25 08/06/2020   NA 140 08/06/2020   K 4.1 08/06/2020   CL 103 08/06/2020   CREATININE 0.98 08/06/2020   BUN 21 08/06/2020   CO2 21 08/06/2020   TSH 2.14 08/06/2020   HGBA1C 5.7 (H) 08/06/2020      Assessment & Plan:

## 2020-08-07 ENCOUNTER — Encounter: Payer: Self-pay | Admitting: Internal Medicine

## 2020-08-07 LAB — CBC WITH DIFFERENTIAL/PLATELET
Absolute Monocytes: 485 cells/uL (ref 200–950)
Basophils Absolute: 69 cells/uL (ref 0–200)
Basophils Relative: 0.7 %
Eosinophils Absolute: 317 cells/uL (ref 15–500)
Eosinophils Relative: 3.2 %
HCT: 39.5 % (ref 35.0–45.0)
Hemoglobin: 13.1 g/dL (ref 11.7–15.5)
Lymphs Abs: 3346 cells/uL (ref 850–3900)
MCH: 30.5 pg (ref 27.0–33.0)
MCHC: 33.2 g/dL (ref 32.0–36.0)
MCV: 92.1 fL (ref 80.0–100.0)
MPV: 10 fL (ref 7.5–12.5)
Monocytes Relative: 4.9 %
Neutro Abs: 5683 cells/uL (ref 1500–7800)
Neutrophils Relative %: 57.4 %
Platelets: 343 10*3/uL (ref 140–400)
RBC: 4.29 10*6/uL (ref 3.80–5.10)
RDW: 12.1 % (ref 11.0–15.0)
Total Lymphocyte: 33.8 %
WBC: 9.9 10*3/uL (ref 3.8–10.8)

## 2020-08-07 LAB — HEMOGLOBIN A1C
Hgb A1c MFr Bld: 5.7 % of total Hgb — ABNORMAL HIGH (ref <5.7)
Mean Plasma Glucose: 117 (calc)
eAG (mmol/L): 6.5 (calc)

## 2020-08-07 LAB — URINALYSIS, ROUTINE W REFLEX MICROSCOPIC
Bilirubin Urine: NEGATIVE
Glucose, UA: NEGATIVE
Nitrite: POSITIVE — AB
RBC / HPF: NONE SEEN /HPF (ref 0–2)
Specific Gravity, Urine: 1.026 (ref 1.001–1.03)
pH: <=5 (ref 5.0–8.0)

## 2020-08-07 LAB — LIPID PANEL
Cholesterol: 169 mg/dL (ref <200)
HDL: 43 mg/dL — ABNORMAL LOW (ref >=50)
LDL Cholesterol (Calc): 104 mg/dL (calc) — ABNORMAL HIGH
Non-HDL Cholesterol (Calc): 126 mg/dL (calc) (ref <130)
Total CHOL/HDL Ratio: 3.9 (calc) (ref <5.0)
Triglycerides: 120 mg/dL (ref <150)

## 2020-08-07 LAB — VITAMIN D 25 HYDROXY (VIT D DEFICIENCY, FRACTURES): Vit D, 25-Hydroxy: 48 ng/mL (ref 30–100)

## 2020-08-07 LAB — TSH: TSH: 2.14 mIU/L (ref 0.40–4.50)

## 2020-08-08 ENCOUNTER — Encounter: Payer: Self-pay | Admitting: Internal Medicine

## 2020-08-08 LAB — COMPLETE METABOLIC PANEL WITH GFR
AG Ratio: 1.4 (calc) (ref 1.0–2.5)
ALT: 37 U/L — ABNORMAL HIGH (ref 6–29)
AST: 25 U/L (ref 10–35)
Albumin: 4 g/dL (ref 3.6–5.1)
Alkaline phosphatase (APISO): 96 U/L (ref 37–153)
BUN: 21 mg/dL (ref 7–25)
CO2: 21 mmol/L (ref 20–32)
Calcium: 9.2 mg/dL (ref 8.6–10.4)
Chloride: 103 mmol/L (ref 98–110)
Creat: 0.98 mg/dL (ref 0.50–0.99)
GFR, Est African American: 70 mL/min/{1.73_m2} (ref >=60)
GFR, Est Non African American: 60 mL/min/{1.73_m2} (ref >=60)
Globulin: 2.8 g/dL (calc) (ref 1.9–3.7)
Glucose, Bld: 86 mg/dL (ref 65–99)
Potassium: 4.1 mmol/L (ref 3.5–5.3)
Sodium: 140 mmol/L (ref 135–146)
Total Bilirubin: 0.4 mg/dL (ref 0.2–1.2)
Total Protein: 6.8 g/dL (ref 6.1–8.1)

## 2020-08-09 ENCOUNTER — Encounter: Payer: Self-pay | Admitting: Internal Medicine

## 2020-08-09 DIAGNOSIS — R202 Paresthesia of skin: Secondary | ICD-10-CM | POA: Insufficient documentation

## 2020-08-09 DIAGNOSIS — E559 Vitamin D deficiency, unspecified: Secondary | ICD-10-CM | POA: Insufficient documentation

## 2020-08-09 DIAGNOSIS — G5621 Lesion of ulnar nerve, right upper limb: Secondary | ICD-10-CM | POA: Insufficient documentation

## 2020-08-09 NOTE — Assessment & Plan Note (Signed)
?   Early neuropathy, to check b12, declines NCS

## 2020-08-09 NOTE — Assessment & Plan Note (Signed)
stable overall by history and exam, recent data reviewed with pt, and pt to continue medical treatment as before,  to f/u any worsening symptoms or concerns, for a1c with labs 

## 2020-08-09 NOTE — Assessment & Plan Note (Addendum)
For lower chol diet, declines statin, and f/u lab  I spent 41 minutes in preparing to see the patient by review of recent labs, imaging and procedures, obtaining and reviewing separately obtained history, communicating with the patient and family or caregiver, ordering medications, tests or procedures, and documenting clinical information in the EHR including the differential Dx, treatment, and any further evaluation and other management of aortic atherosclerosis, hld, hyperglycemia, right ulnar neuritis, bilat foot paresthesias, vit d def

## 2020-08-09 NOTE — Assessment & Plan Note (Signed)
For oral replacement, vit d 2000 qd

## 2020-08-09 NOTE — Assessment & Plan Note (Signed)
Likely due to overuse, asympt today, ok for tylenol prn and rest prn

## 2020-08-09 NOTE — Assessment & Plan Note (Signed)
D/w pt, declines statin tx, for low chol diet

## 2020-08-12 ENCOUNTER — Other Ambulatory Visit: Payer: Self-pay | Admitting: Internal Medicine

## 2020-08-12 NOTE — Telephone Encounter (Signed)
Please refill as per office routine med refill policy (all routine meds refilled for 3 mo or monthly per pt preference up to one year from last visit, then month to month grace period for 3 mo, then further med refills will have to be denied)  

## 2020-09-03 ENCOUNTER — Other Ambulatory Visit: Payer: Self-pay | Admitting: Radiology

## 2020-09-03 ENCOUNTER — Encounter: Payer: Self-pay | Admitting: Obstetrics & Gynecology

## 2020-09-03 DIAGNOSIS — N6022 Fibroadenosis of left breast: Secondary | ICD-10-CM | POA: Diagnosis not present

## 2020-09-03 DIAGNOSIS — R921 Mammographic calcification found on diagnostic imaging of breast: Secondary | ICD-10-CM | POA: Diagnosis not present

## 2020-09-03 LAB — HM MAMMOGRAPHY

## 2020-10-04 ENCOUNTER — Other Ambulatory Visit: Payer: Self-pay | Admitting: Internal Medicine

## 2020-10-05 ENCOUNTER — Encounter: Payer: Self-pay | Admitting: Internal Medicine

## 2020-10-12 DIAGNOSIS — Z23 Encounter for immunization: Secondary | ICD-10-CM | POA: Diagnosis not present

## 2021-05-06 DIAGNOSIS — L814 Other melanin hyperpigmentation: Secondary | ICD-10-CM | POA: Diagnosis not present

## 2021-05-06 DIAGNOSIS — D1801 Hemangioma of skin and subcutaneous tissue: Secondary | ICD-10-CM | POA: Diagnosis not present

## 2021-05-06 DIAGNOSIS — D225 Melanocytic nevi of trunk: Secondary | ICD-10-CM | POA: Diagnosis not present

## 2021-05-06 DIAGNOSIS — D2372 Other benign neoplasm of skin of left lower limb, including hip: Secondary | ICD-10-CM | POA: Diagnosis not present

## 2021-05-06 DIAGNOSIS — L821 Other seborrheic keratosis: Secondary | ICD-10-CM | POA: Diagnosis not present

## 2021-05-07 DIAGNOSIS — Z20822 Contact with and (suspected) exposure to covid-19: Secondary | ICD-10-CM | POA: Diagnosis not present

## 2021-06-07 ENCOUNTER — Other Ambulatory Visit: Payer: Self-pay | Admitting: Internal Medicine

## 2021-06-07 NOTE — Telephone Encounter (Signed)
Please refill as per office routine med refill policy (all routine meds refilled for 3 mo or monthly per pt preference up to one year from last visit, then month to month grace period for 3 mo, then further med refills will have to be denied)  

## 2021-07-20 DIAGNOSIS — R921 Mammographic calcification found on diagnostic imaging of breast: Secondary | ICD-10-CM | POA: Diagnosis not present

## 2021-07-20 LAB — HM MAMMOGRAPHY

## 2021-07-22 ENCOUNTER — Encounter: Payer: Self-pay | Admitting: Internal Medicine

## 2021-07-22 NOTE — Progress Notes (Signed)
Outside notes received. Information abstracted. Notes sent to scan.  

## 2021-08-09 ENCOUNTER — Encounter: Payer: Self-pay | Admitting: Internal Medicine

## 2021-08-09 ENCOUNTER — Other Ambulatory Visit: Payer: Self-pay

## 2021-08-09 ENCOUNTER — Ambulatory Visit (INDEPENDENT_AMBULATORY_CARE_PROVIDER_SITE_OTHER): Payer: Medicare Other

## 2021-08-09 ENCOUNTER — Ambulatory Visit (INDEPENDENT_AMBULATORY_CARE_PROVIDER_SITE_OTHER): Payer: Medicare Other | Admitting: Internal Medicine

## 2021-08-09 VITALS — BP 120/62 | HR 61 | Temp 98.7°F | Ht 72.5 in | Wt 223.0 lb

## 2021-08-09 DIAGNOSIS — M7061 Trochanteric bursitis, right hip: Secondary | ICD-10-CM | POA: Insufficient documentation

## 2021-08-09 DIAGNOSIS — R059 Cough, unspecified: Secondary | ICD-10-CM

## 2021-08-09 DIAGNOSIS — E538 Deficiency of other specified B group vitamins: Secondary | ICD-10-CM

## 2021-08-09 DIAGNOSIS — E7800 Pure hypercholesterolemia, unspecified: Secondary | ICD-10-CM | POA: Diagnosis not present

## 2021-08-09 DIAGNOSIS — R7302 Impaired glucose tolerance (oral): Secondary | ICD-10-CM | POA: Diagnosis not present

## 2021-08-09 DIAGNOSIS — Z23 Encounter for immunization: Secondary | ICD-10-CM | POA: Diagnosis not present

## 2021-08-09 DIAGNOSIS — I700 Atherosclerosis of aorta: Secondary | ICD-10-CM

## 2021-08-09 DIAGNOSIS — E559 Vitamin D deficiency, unspecified: Secondary | ICD-10-CM

## 2021-08-09 LAB — URINALYSIS, ROUTINE W REFLEX MICROSCOPIC
Bilirubin Urine: NEGATIVE
Hgb urine dipstick: NEGATIVE
Nitrite: POSITIVE — AB
RBC / HPF: NONE SEEN (ref 0–?)
Specific Gravity, Urine: 1.025 (ref 1.000–1.030)
Total Protein, Urine: NEGATIVE
Urine Glucose: NEGATIVE
Urobilinogen, UA: 0.2 (ref 0.0–1.0)
pH: 6 (ref 5.0–8.0)

## 2021-08-09 LAB — LIPID PANEL
Cholesterol: 189 mg/dL (ref 0–200)
HDL: 46.5 mg/dL (ref >39.00)
LDL Cholesterol: 120 mg/dL — ABNORMAL HIGH (ref 0–99)
NonHDL: 142.9
Total CHOL/HDL Ratio: 4
Triglycerides: 113 mg/dL (ref 0.0–149.0)
VLDL: 22.6 mg/dL (ref 0.0–40.0)

## 2021-08-09 LAB — BASIC METABOLIC PANEL
BUN: 17 mg/dL (ref 6–23)
CO2: 26 mEq/L (ref 19–32)
Calcium: 9.6 mg/dL (ref 8.4–10.5)
Chloride: 105 mEq/L (ref 96–112)
Creatinine, Ser: 0.95 mg/dL (ref 0.40–1.20)
GFR: 61.85 mL/min (ref >60.00)
Glucose, Bld: 91 mg/dL (ref 70–99)
Potassium: 3.6 mEq/L (ref 3.5–5.1)
Sodium: 141 mEq/L (ref 135–145)

## 2021-08-09 LAB — HEPATIC FUNCTION PANEL
ALT: 23 U/L (ref 0–35)
AST: 25 U/L (ref 0–37)
Albumin: 4.2 g/dL (ref 3.5–5.2)
Alkaline Phosphatase: 75 U/L (ref 39–117)
Bilirubin, Direct: 0.2 mg/dL (ref 0.0–0.3)
Total Bilirubin: 0.8 mg/dL (ref 0.2–1.2)
Total Protein: 7.3 g/dL (ref 6.0–8.3)

## 2021-08-09 LAB — CBC WITH DIFFERENTIAL/PLATELET
Basophils Absolute: 0.1 10*3/uL (ref 0.0–0.1)
Basophils Relative: 0.7 % (ref 0.0–3.0)
Eosinophils Absolute: 0.3 10*3/uL (ref 0.0–0.7)
Eosinophils Relative: 3.8 % (ref 0.0–5.0)
HCT: 42.2 % (ref 36.0–46.0)
Hemoglobin: 14.1 g/dL (ref 12.0–15.0)
Lymphocytes Relative: 43.7 % (ref 12.0–46.0)
Lymphs Abs: 3.6 10*3/uL (ref 0.7–4.0)
MCHC: 33.6 g/dL (ref 30.0–36.0)
MCV: 90.1 fl (ref 78.0–100.0)
Monocytes Absolute: 0.5 10*3/uL (ref 0.1–1.0)
Monocytes Relative: 6.3 % (ref 3.0–12.0)
Neutro Abs: 3.8 10*3/uL (ref 1.4–7.7)
Neutrophils Relative %: 45.5 % (ref 43.0–77.0)
Platelets: 262 10*3/uL (ref 150.0–400.0)
RBC: 4.68 Mil/uL (ref 3.87–5.11)
RDW: 12.9 % (ref 11.5–15.5)
WBC: 8.3 10*3/uL (ref 4.0–10.5)

## 2021-08-09 LAB — VITAMIN D 25 HYDROXY (VIT D DEFICIENCY, FRACTURES): VITD: 50.43 ng/mL (ref 30.00–100.00)

## 2021-08-09 LAB — HEMOGLOBIN A1C: Hgb A1c MFr Bld: 5.8 % (ref 4.6–6.5)

## 2021-08-09 LAB — VITAMIN B12: Vitamin B-12: 418 pg/mL (ref 211–911)

## 2021-08-09 LAB — TSH: TSH: 2.72 u[IU]/mL (ref 0.35–5.50)

## 2021-08-09 MED ORDER — PANTOPRAZOLE SODIUM 40 MG PO TBEC: 40.0000 mg | DELAYED_RELEASE_TABLET | Freq: Every day | ORAL | 3 refills | Status: DC

## 2021-08-09 NOTE — Assessment & Plan Note (Signed)
To contineu low chol diet, exercise, wt control, and delcines statin as card ct score was zero

## 2021-08-09 NOTE — Assessment & Plan Note (Signed)
Lab Results  Component Value Date   LDLCALC 120 (H) 08/09/2021   uncontrolled, pt to continue current low chol diet declines statin for now

## 2021-08-09 NOTE — Patient Instructions (Addendum)
You had the flu shot today  Please continue all other medications as before, and refills have been done if requested - protonix  Please have the pharmacy call with any other refills you may need.  Please continue your efforts at being more active, low cholesterol diet, and weight control.  You are otherwise up to date with prevention measures today.  Please keep your appointments with your specialists as you may have planned  You will be contacted regarding the referral for: Sports Medicine for the right hip  Please go to the XRAY Department in the first floor for the x-ray testing - for the chest xray for the cough  Please go to the LAB at the blood drawing area for the tests to be done  You will be contacted by phone if any changes need to be made immediately.  Otherwise, you will receive a letter about your results with an explanation, but please check with MyChart first.  Please remember to sign up for MyChart if you have not done so, as this will be important to you in the future with finding out test results, communicating by private email, and scheduling acute appointments online when needed.  Please make an Appointment to return for your 1 year visit, or sooner if needed

## 2021-08-09 NOTE — Progress Notes (Signed)
Patient ID: [name], female   DOB: Jun 25, 1951, 70 y.o.   MRN: [id number]         Chief Complaint:: yearly exam       HPI:  [name] is a 70 y.o. female here with above,, c/o right lateral hip area mild to mod pain, intermittent, sharp, worse to lie on at night, better to not do this, no prior hx of falls.  Does c/o ongoing fatigue, but denies signficant daytime hypersomnolence.  Also has recent cough with speaking but Pt denies chest pain, increased sob or doe, wheezing, orthopnea, PND, increased LE swelling, palpitations, dizziness or syncope.  Also has midl worsening bilateral feet numbness without signifcant lower back or other pain.   Pt denies polydipsia, polyuria, or new focal neuro s/s.   Pt denies fever, wt loss, night sweats, loss of appetite, or other constitutional symptoms  Admits to some dietary non complaince.    Wt Readings from Last 3 Encounters:  08/09/21 223 lb (101.2 kg)  08/06/20 239 lb (108.4 kg)  07/30/20 244 lb (110.7 kg)   BP Readings from Last 3 Encounters:  08/09/21 120/62  08/06/20 120/70  07/30/20 (!) 170/81   Immunization History  Administered Date(s) Administered   Fluad Quad(high Dose 65+) 08/02/2019, 08/06/2020, 08/09/2021   Influenza Split 09/12/2012   Influenza Whole 10/03/2007, 08/16/2010   Influenza,inj,Quad PF,6+ Mos 08/07/2013, 07/18/2014, 07/21/2015, 07/21/2016, 07/28/2017, 07/31/2018   PFIZER(Purple Top)SARS-COV-2 Vaccination 12/13/2019, 01/07/2020, 10/12/2020   Pneumococcal Conjugate-13 08/19/2019   Td 09/08/2003   Tdap 03/20/2014   There are no preventive care reminders to display for this patient.     Past Medical History:  Diagnosis Date   Abnormal Pap smear of vagina 06/17/15   LGSIL   Allergic rhinitis, cause unspecified    Arthritis    ARTHRITIS, KNEE 02/10/2009   Qualifier: Diagnosis of  By: Niel Hummer MD, [place] R    BRCA negative 03/25/13   Cervical cancer ([place]) 1984   Cervical disc disease 02/11/2012   Chronic lower back  pain    Degenerative arthritis of hip 02/11/2012   Diverticulosis 02/11/2012   GERD 08/10/2007   Qualifier: Diagnosis of  By: Sherlynn Stalls, CMA, [place]     GERD (gastroesophageal reflux disease)    GERD (gastroesophageal reflux disease)    Hemorrhoids    HEMORRHOIDS-INTERNAL 07/21/2010   Qualifier: Diagnosis of  By: Chester Holstein NP, [place]     Hiatal hernia    Hyperlipidemia 02/13/2012   Mitral valve prolapse    no cardiologist, mild   OAB (overactive bladder)    Obesity    OVERACTIVE BLADDER 08/10/2007   Qualifier: Diagnosis of  By: Sherlynn Stalls, [place], [place]     Pneumonia 2017   PONV (postoperative nausea and vomiting)    yrs ago unable to raise arms after female surgery, no recent problems   Recurrent UTI    Sleep apnea    uses cpap set on 2   SUI (stress urinary incontinence, female)    Urinary incontinence    VAGINITIS, ATROPHIC 11/11/2008   Qualifier: Diagnosis of  By: Niel Hummer MD, Lorinda Creed    Past Surgical History:  Procedure Laterality Date   bladder tack     x 3 or 4   BUNIONECTOMY Right 2007   CERVICAL DISCECTOMY  2002   fusion and plate C 3   CHOLECYSTECTOMY N/A 08/13/2013   Procedure: LAPAROSCOPIC CHOLECYSTECTOMY WITH INTRAOPERATIVE CHOLANGIOGRAM;  Surgeon: Shann Medal, MD;  Location: [place];  Service: General;  Laterality: N/A;   CLOSED REDUCTION METATARSAL FRACTURE     rt side 06-03-18, left side 08-19-17   COLONOSCOPY  2018   conization of cervix  1982   with D&C   CYSTOCELE REPAIR  2003   rectocele repair   EYE SURGERY Bilateral yrs ago   lasik   ORIF ANKLE FRACTURE Right 08/20/2017   Procedure: OPEN REDUCTION INTERNAL FIXATION (ORIF) ANKLE FRACTURE;  Surgeon: Meredith Pel, MD;  Location: [facility];  Service: Orthopedics;  Laterality: Right;   SHOULDER ARTHROSCOPY Right 2004   frozen shoulder   TONSILLECTOMY  as child   adenoids removed   TRANSANAL HEMORRHOIDAL DEARTERIALIZATION N/A 07/30/2020   Procedure: TRANSANAL HEMORRHOIDAL DEARTERIALIZATION;  Surgeon: Leighton Ruff, MD;  Location: [place];  Service: General;  Laterality: N/A;   [place]   partial    reports that she has never smoked. She has never used smokeless tobacco. She reports current alcohol use. She reports that she does not use drugs. family history includes Breast cancer in her cousin, mother, and sister; Cancer in her maternal grandmother; Diabetes in her brother, maternal aunt, and maternal uncle; Heart disease in her brother and sister; Thyroid cancer in an other family member. Allergies  Allergen Reactions   Amoxicillin     REACTION: unspecified   Penicillins Other (See Comments)    Unknown reaction, mother had reaction almost died   Current Outpatient Medications on File Prior to Visit  Medication Sig Dispense Refill   cholecalciferol (VITAMIN D3) 25 MCG (1000 UNIT) tablet Take 2,000 Units by mouth daily. 50 mcg daily     estradiol (ESTRACE VAGINAL) 0.1 MG/GM vaginal cream 1 gram pv twice weekly 42.5 g 3   MULTIPLE VITAMIN tablet Take 1 tablet by mouth daily.     nabumetone (RELAFEN) 750 MG tablet TAKE 1 TABLET BY MOUTH TWICE A DAY AS NEEDED FOR ARTHRITIS 180 tablet 2   No current facility-administered medications on file prior to visit.        ROS:  All others reviewed and negative.  Objective        PE:  BP 120/62 (BP Location: Right Arm, Patient Position: Sitting, Cuff Size: Large)   Pulse 61   Temp 98.7 F (37.1 C) (Oral)   Ht 6' 0.5" (1.842 m)   Wt 223 lb (101.2 kg)   SpO2 96%   BMI 29.83 kg/m                 Constitutional: Pt appears in NAD               HENT: Head: NCAT.                Right Ear: External ear normal.                 Left Ear: External ear normal.                Eyes: . Pupils are equal, round, and reactive to light. Conjunctivae and EOM are normal               Nose: without d/c or deformity               Neck: Neck supple. Gross normal ROM               Cardiovascular: Normal rate and regular rhythm.                  Pulmonary/Chest:  Effort normal and breath sounds without rales or wheezing.                Abd:  Soft, NT, ND, + BS, no organomegaly               Neurological: Pt is alert. At baseline orientation, motor grossly intact               Skin: Skin is warm. No rashes, no other new lesions, LE edema - none               Psychiatric: Pt behavior is normal without agitation   Micro: none  Cardiac tracings I have personally interpreted today:  none  Pertinent Radiological findings (summarize): none   Lab Results  Component Value Date   WBC 8.3 08/09/2021   HGB 14.1 08/09/2021   HCT 42.2 08/09/2021   PLT 262.0 08/09/2021   GLUCOSE 91 08/09/2021   CHOL 189 08/09/2021   TRIG 113.0 08/09/2021   HDL 46.50 08/09/2021   LDLDIRECT 135.1 05/08/2012   LDLCALC 120 (H) 08/09/2021   ALT 23 08/09/2021   AST 25 08/09/2021   NA 141 08/09/2021   K 3.6 08/09/2021   CL 105 08/09/2021   CREATININE 0.95 08/09/2021   BUN 17 08/09/2021   CO2 26 08/09/2021   TSH 2.72 08/09/2021   HGBA1C 5.8 08/09/2021   Assessment/Plan:  [name] is a 70 y.o. White or Caucasian [1] female with  has a past medical history of Abnormal Pap smear of vagina (06/17/15), Allergic rhinitis, cause unspecified, Arthritis, ARTHRITIS, KNEE (02/10/2009), BRCA negative (03/25/13), Cervical cancer ([place]) (1984), Cervical disc disease (02/11/2012), Chronic lower back pain, Degenerative arthritis of hip (02/11/2012), Diverticulosis (02/11/2012), GERD (08/10/2007), GERD (gastroesophageal reflux disease), GERD (gastroesophageal reflux disease), Hemorrhoids, HEMORRHOIDS-INTERNAL (07/21/2010), Hiatal hernia, Hyperlipidemia (02/13/2012), Mitral valve prolapse, OAB (overactive bladder), Obesity, OVERACTIVE BLADDER (08/10/2007), Pneumonia (2017), PONV (postoperative nausea and vomiting), Recurrent UTI, Sleep apnea, SUI (stress urinary incontinence, female), Urinary incontinence, and VAGINITIS, ATROPHIC (11/11/2008).  Aortic atherosclerosis ([place]) To  contineu low chol diet, exercise, wt control, and delcines statin as card ct score was zero  Hyperlipidemia Lab Results  Component Value Date   LDLCALC 120 (H) 08/09/2021   uncontrolled, pt to continue current low chol diet declines statin for now   Impaired glucose tolerance Lab Results  Component Value Date   HGBA1C 5.8 08/09/2021   Stable, pt to continue current medical treatment  - diet   Cough Etiology not clear, exam benign, ok for cxr  Vitamin D deficiency Last vitamin D Lab Results  Component Value Date   VD25OH 50.43 08/09/2021   Stable, cont oral replacement  Followup: No follow-ups on file.  Cathlean Cower, MD 08/09/2021 10:31 PM [facility]

## 2021-08-09 NOTE — Assessment & Plan Note (Signed)
Lab Results  Component Value Date   HGBA1C 5.8 08/09/2021   Stable, pt to continue current medical treatment  - diet

## 2021-08-09 NOTE — Assessment & Plan Note (Signed)
Last vitamin D Lab Results  Component Value Date   VD25OH 50.43 08/09/2021   Stable, cont oral replacement  

## 2021-08-09 NOTE — Assessment & Plan Note (Signed)
Etiology not clear, exam benign, ok for cxr

## 2021-08-16 ENCOUNTER — Ambulatory Visit (INDEPENDENT_AMBULATORY_CARE_PROVIDER_SITE_OTHER): Payer: Medicare Other | Admitting: Family Medicine

## 2021-08-16 ENCOUNTER — Other Ambulatory Visit: Payer: Self-pay

## 2021-08-16 ENCOUNTER — Ambulatory Visit (INDEPENDENT_AMBULATORY_CARE_PROVIDER_SITE_OTHER): Payer: Medicare Other

## 2021-08-16 ENCOUNTER — Ambulatory Visit: Payer: Self-pay

## 2021-08-16 VITALS — BP 148/98 | HR 73 | Ht 72.5 in | Wt 229.6 lb

## 2021-08-16 DIAGNOSIS — M545 Low back pain, unspecified: Secondary | ICD-10-CM | POA: Diagnosis not present

## 2021-08-16 DIAGNOSIS — M79604 Pain in right leg: Secondary | ICD-10-CM | POA: Diagnosis not present

## 2021-08-16 DIAGNOSIS — G8929 Other chronic pain: Secondary | ICD-10-CM | POA: Diagnosis not present

## 2021-08-16 DIAGNOSIS — M25551 Pain in right hip: Secondary | ICD-10-CM | POA: Diagnosis not present

## 2021-08-16 NOTE — Progress Notes (Signed)
I, Peterson Lombard, LAT, ATC acting as a scribe for Lynne Leader, MD.  Subjective:    CC: R hip pain  HPI: Pt is a 70 y/o female c/o R hip pain x 6+month w/ worsening pain. Pt previously saw Dr. Tamala Julian in 2016 for R hip pain. Pt notes she has arthritis. Pt locates pain to the lateral aspect of her R hip and along the lateral aspect of her thigh to knee.  LBP: yes- recently Radiates: yes LE numbness/tingling: yes LE weakness: no Aggravates: laying, Treatments tried: naproxen  Pertinent review of Systems: No fevers or chills  Relevant historical information: Sleep apnea.   Objective:    Vitals:   08/16/21 1437  BP: (!) 148/98  Pulse: 73  SpO2: 97%   General: Well Developed, well nourished, and in no acute distress.   MSK: Right hip normal appearing Normal motion. Tender palpation greater trochanter. Hip abduction and external rotation strength diminished 4/5. Right knee normal-appearing normal motion.  Tender palpation lateral knee at IT band   Lab and Radiology Results  Procedure: Real-time Ultrasound Guided Injection of right hip greater trochanter bursa Device: Philips Affiniti 50G Images permanently stored and available for review in PACS Verbal informed consent obtained.  Discussed risks and benefits of procedure. Warned about infection bleeding damage to structures skin hypopigmentation and fat atrophy among others. Patient expresses understanding and agreement Time-out conducted.   Noted no overlying erythema, induration, or other signs of local infection.   Skin prepped in a sterile fashion.   Local anesthesia: Topical Ethyl chloride.   With sterile technique and under real time ultrasound guidance: 40 mg of Kenalog and 2 mL of lidocaine injected into greater trochanter bursa. Fluid seen entering the bursa.   Completed without difficulty   Pain immediately resolved suggesting accurate placement of the medication.   Advised to call if fevers/chills,  erythema, induration, drainage, or persistent bleeding.   Images permanently stored and available for review in the ultrasound unit.  Impression: Technically successful ultrasound guided injection.    X-ray images right hip and L-spine obtained today personally and independently interpreted  Right hip: No significant right hip OA.  Radiolucencies present overlying pubic symphysis.  L-spine: DDD and facet DJD L4-L5 and L5-S1.  No acute fractures.  Await formal radiology review    Impression and Recommendations:    Assessment and Plan: 70 y.o. female with right lateral hip pain thought to be due to greater trochanteric bursitis and hip abductor tendinopathy.  Additionally she has some pain in distal IT band which is probably related to the same issue.  It is possible she also has meralgia paresthetica.  Plan to treat with greater trochanter bursa injection and referral to PT for strengthening and stretching. Recheck in about 6 weeks.[name] Kitchen  PDMP not reviewed this encounter. Orders Placed This Encounter  Procedures   [place] LIMITED JOINT SPACE STRUCTURES LOW RIGHT(NO LINKED CHARGES)    Standing Status:   Future    Number of Occurrences:   1    Standing Expiration Date:   02/14/2022    Order Specific Question:   Reason for Exam (SYMPTOM  OR DIAGNOSIS REQUIRED)    Answer:   right hip pain    Order Specific Question:   Preferred imaging location?    Answer:   [facility]   DG HIP UNILAT W OR W/O PELVIS 2-3 VIEWS RIGHT    Standing Status:   Future    Number of Occurrences:   1  Standing Expiration Date:   08/16/2022    Order Specific Question:   Reason for Exam (SYMPTOM  OR DIAGNOSIS REQUIRED)    Answer:   right hip pain    Order Specific Question:   Preferred imaging location?    Answer:   [facility]   DG Lumbar Spine 2-3 Views    Standing Status:   Future    Number of Occurrences:   1    Standing Expiration Date:   08/16/2022    Order Specific  Question:   Reason for Exam (SYMPTOM  OR DIAGNOSIS REQUIRED)    Answer:   low back pain    Order Specific Question:   Preferred imaging location?    Answer:   [facility]   Ambulatory referral to Physical Therapy    Referral Priority:   Routine    Referral Type:   Physical Medicine    Referral Reason:   Specialty Services Required    Requested Specialty:   Physical Therapy    Number of Visits Requested:   1   No orders of the defined types were placed in this encounter.   Discussed warning signs or symptoms. Please see discharge instructions. Patient expresses understanding.   The above documentation has been reviewed and is accurate and complete Lynne Leader, M.D.

## 2021-08-16 NOTE — Patient Instructions (Addendum)
Thank you for coming in today.   You received an injection today. Seek immediate medical attention if the joint becomes red, extremely painful, or is oozing fluid.   Please get an Xray today before you leave   I've referred you to Physical Therapy.  Let us know if you don't hear from them in one week.   Recheck back in 6 weeks.

## 2021-08-18 NOTE — Progress Notes (Signed)
Right hip x-ray does not show any acute fractures but does show some degenerative changes in the SI joint in the back of the pelvis area.[name] Kitchen

## 2021-08-18 NOTE — Progress Notes (Signed)
Lumbar spine x-ray shows significant arthritis changes in the low back.

## 2021-09-20 ENCOUNTER — Ambulatory Visit (HOSPITAL_BASED_OUTPATIENT_CLINIC_OR_DEPARTMENT_OTHER): Payer: Medicare Other | Attending: Family Medicine | Admitting: Physical Therapy

## 2021-09-20 ENCOUNTER — Encounter (HOSPITAL_BASED_OUTPATIENT_CLINIC_OR_DEPARTMENT_OTHER): Payer: Self-pay | Admitting: Physical Therapy

## 2021-09-20 ENCOUNTER — Other Ambulatory Visit: Payer: Self-pay

## 2021-09-20 DIAGNOSIS — M25551 Pain in right hip: Secondary | ICD-10-CM | POA: Diagnosis not present

## 2021-09-20 DIAGNOSIS — M79604 Pain in right leg: Secondary | ICD-10-CM | POA: Insufficient documentation

## 2021-09-20 DIAGNOSIS — G8929 Other chronic pain: Secondary | ICD-10-CM | POA: Diagnosis not present

## 2021-09-20 DIAGNOSIS — R262 Difficulty in walking, not elsewhere classified: Secondary | ICD-10-CM | POA: Insufficient documentation

## 2021-09-20 DIAGNOSIS — M6281 Muscle weakness (generalized): Secondary | ICD-10-CM | POA: Insufficient documentation

## 2021-09-20 NOTE — Therapy (Signed)
OUTPATIENT PHYSICAL THERAPY LOWER EXTREMITY EVALUATION   Patient Name: [name] MRN: [id number] DOB:02-Jan-1951, 70 y.o., female Today's Date: 09/20/2021   PT End of Session - 09/20/21 0935     Visit Number 1    Number of Visits 13    Date for PT Re-Evaluation 11/05/21    Authorization Type MCR- FOTO visit 6    Progress Note Due on Visit 10    PT Start Time 0930    PT Stop Time 1013    PT Time Calculation (min) 43 min    Activity Tolerance Patient tolerated treatment well    Behavior During Therapy [facility] for tasks assessed/performed             Past Medical History:  Diagnosis Date   Abnormal Pap smear of vagina 06/17/15   LGSIL   Allergic rhinitis, cause unspecified    Arthritis    ARTHRITIS, KNEE 02/10/2009   Qualifier: Diagnosis of  By: Niel Hummer MD, Willie R    BRCA negative 03/25/13   Cervical cancer ([place]) 1984   Cervical disc disease 02/11/2012   Chronic lower back pain    Degenerative arthritis of hip 02/11/2012   Diverticulosis 02/11/2012   GERD 08/10/2007   Qualifier: Diagnosis of  By: Sherlynn Stalls, CMA, Cindy     GERD (gastroesophageal reflux disease)    GERD (gastroesophageal reflux disease)    Hemorrhoids    HEMORRHOIDS-INTERNAL 07/21/2010   Qualifier: Diagnosis of  By: Chester Holstein NP, Paula     Hiatal hernia    Hyperlipidemia 02/13/2012   Mitral valve prolapse    no cardiologist, mild   OAB (overactive bladder)    Obesity    OVERACTIVE BLADDER 08/10/2007   Qualifier: Diagnosis of  By: Sherlynn Stalls, [place], Gamaliel     Pneumonia 2017   PONV (postoperative nausea and vomiting)    yrs ago unable to raise arms after female surgery, no recent problems   Recurrent UTI    Sleep apnea    uses cpap set on 2   SUI (stress urinary incontinence, female)    Urinary incontinence    VAGINITIS, ATROPHIC 11/11/2008   Qualifier: Diagnosis of  By: Niel Hummer MD, Lorinda Creed    Past Surgical History:  Procedure Laterality Date   bladder tack     x 3 or 4   BUNIONECTOMY Right 2007    CERVICAL DISCECTOMY  2002   fusion and plate C 3   CHOLECYSTECTOMY N/A 08/13/2013   Procedure: LAPAROSCOPIC CHOLECYSTECTOMY WITH INTRAOPERATIVE CHOLANGIOGRAM;  Surgeon: Shann Medal, MD;  Location: [place];  Service: General;  Laterality: N/A;   CLOSED REDUCTION METATARSAL FRACTURE     rt side 06-03-18, left side 08-19-17   COLONOSCOPY  2018   conization of cervix  1982   with D&C   CYSTOCELE REPAIR  2003   rectocele repair   EYE SURGERY Bilateral yrs ago   lasik   ORIF ANKLE FRACTURE Right 08/20/2017   Procedure: OPEN REDUCTION INTERNAL FIXATION (ORIF) ANKLE FRACTURE;  Surgeon: Meredith Pel, MD;  Location: [facility];  Service: Orthopedics;  Laterality: Right;   SHOULDER ARTHROSCOPY Right 2004   frozen shoulder   TONSILLECTOMY  as child   adenoids removed   TRANSANAL HEMORRHOIDAL DEARTERIALIZATION N/A 07/30/2020   Procedure: TRANSANAL HEMORRHOIDAL DEARTERIALIZATION;  Surgeon: Leighton Ruff, MD;  Location: [place];  Service: General;  Laterality: N/A;   VAGINAL HYSTERECTOMY  1986   partial   Patient Active Problem List   Diagnosis Date  Noted   Trochanteric bursitis of right hip 08/09/2021   Vitamin D deficiency 08/09/2020   Paresthesia of foot, bilateral 08/09/2020   Neuritis of right ulnar nerve 08/09/2020   Aortic atherosclerosis ([place]) 08/06/2020   Left cervical radiculopathy 10/07/2019   Bilateral low back pain with bilateral sciatica 10/07/2019   Acute upper respiratory infection 01/17/2019   Acute conjunctivitis of left eye 01/17/2019   Urinary frequency 01/09/2019   Cough 05/03/2018   Dysuria 03/28/2018   Trimalleolar fracture 08/20/2017   Ankle fracture 08/20/2017   OSA on CPAP 07/30/2017   Right hip pain 07/28/2017   Abnormal TSH 07/21/2015   Greater trochanteric bursitis of right hip 08/04/2014   Localized osteoarthrosis, lower leg 63/84/6659   Biliary colic 93/57/0177   Cholelithiasis 08/07/2013   Hyperlipidemia 02/13/2012   Impaired  glucose tolerance 02/13/2012   Preventative health care 02/11/2012   Diverticulosis 02/11/2012   Degenerative arthritis of hip 02/11/2012   Cervical disc disease 02/11/2012   Allergic rhinitis    Chronic low back pain    Obesity    Blood in stool 08/31/2010   HEMORRHOIDS-INTERNAL 07/21/2010   CONSTIPATION 07/19/2010   ARTHRITIS, KNEE 02/10/2009   VAGINITIS, ATROPHIC 11/11/2008   EXOGENOUS OBESITY 07/29/2008   GERD 08/10/2007   OVERACTIVE BLADDER 08/10/2007    PCP: Biagio Borg, MD  REFERRING PROVIDER: Gregor Hams, MD  REFERRING DIAG: [phone] (ICD-10-CM) - Chronic right hip pain   THERAPY DIAG:  Pain in right leg  Difficulty in walking, not elsewhere classified  Muscle weakness (generalized)  ONSET DATE: about 6 mo ago recently but also about 4 yr ago  SUBJECTIVE:   SUBJECTIVE STATEMENT: Started with [place] and TTP along Rt ITB. Since the shot, has tenderness in distal, lateral thigh. In the last few weeks I notice my balance has decreased, takes a few seconds to get my balance after standing. Denies regular exercise.   PERTINENT HISTORY: Procedure: Real-time Ultrasound Guided Injection of right hip greater trochanter bursa on 08/16/2021  H/o bil ankle fx with Rt ORIF- stiffness noted  PAIN:  Are you having pain? Yes VAS scale: 4/10 Pain location: Rt lateral, distal thigh Pain orientation: Right  PAIN TYPE: aching Pain description: constant  Aggravating factors: walking about 2 miles creates numbness, stooping sometimes Relieving factors: meds  PRECAUTIONS: None  WEIGHT BEARING RESTRICTIONS No  FALLS:  Has patient fallen in last 6 months? No, Number of falls: 0  LIVING ENVIRONMENT: Lives with: lives with their family Lives in: House/apartment Stairs: Yes; 2 store home but does not go upstairs often   OCCUPATION: retired  PLOF: Independent  PATIENT GOALS improve balance, decrease pain   OBJECTIVE:   DIAGNOSTIC FINDINGS: Right hip: No  significant right hip OA.  Radiolucencies present overlying pubic symphysis.   L-spine: DDD and facet DJD L4-L5 and L5-S1.  No acute fractures.  PATIENT SURVEYS:  FOTO 81  COGNITION:  Overall cognitive status: Within functional limits for tasks assessed     SENSATION:  Occasional numbness along ITB  MUSCLE LENGTH: Gross limitation in flexibility  POSTURE:  Lt ASIS elevation, bil arch collapse is mild Supine functional LLD- Lt longer Rt innominate post rotation in supine  LE AROM/PROM:  A/PROM Right 09/20/2021 Left 09/20/2021  Hip flexion    Hip extension    Hip abduction    Hip adduction    Hip internal rotation    Hip external rotation    Knee flexion    Knee extension    Ankle dorsiflexion  3   Ankle plantarflexion    Ankle inversion    Ankle eversion     (Blank rows = not tested)  LE MMT:  not appropriate to test at eval due to notable pelvic rotation  MMT Right 09/20/2021 Left 09/20/2021  Hip flexion    Hip extension    Hip abduction    Hip adduction    Hip internal rotation    Hip external rotation    Knee flexion    Knee extension    Ankle dorsiflexion    Ankle plantarflexion    Ankle inversion    Ankle eversion     (Blank rows = not tested)   JOINT MOBILITY ASSESSMENT:  Decreased spring of Rt innom into post rotation   GAIT: Limited mobility in Rt ankle vs Lt noted at heel strike; good arm swing but lacks trunk rotation    TODAY'S TREATMENT: Eval  Manual- anterior rotation Rt innominate in prone  Supine over towel roll along Rt sacral ILA   PATIENT EDUCATION:  Education details: Anatomy of condition, POC, HEP, exercise form/rationale Person educated: Patient Education method: Explanation, Demonstration, Tactile cues, Verbal cues, and Handouts Education comprehension: verbalized understanding, returned demonstration, verbal cues required, tactile cues required, and needs further education   HOME EXERCISE  PROGRAM: [id number]  ASSESSMENT:  CLINICAL IMPRESSION: Patient is a 70 y.o. F who was seen today for physical therapy evaluation and treatment for chronic Rt hip pain and pain along Rt ITB. Objective impairments include decreased activity tolerance, decreased balance, difficulty walking, decreased strength, increased muscle spasms, impaired flexibility, improper body mechanics, postural dysfunction, and pain. These impairments are limiting patient from cleaning, community activity, meal prep, and shopping. Personal factors including 1-2 comorbidities: acute on chronic hip pain, LBP, h/o bil ankle fx-Rt ORIF, sensation of decreasing balance  are also affecting patient's functional outcome. Patient will benefit from skilled PT to address above impairments and improve overall function.  3-layer heel lift in Rt shoe today after manual therapy to pelvis and she reported that this felt better. Was told in the past that her Rt leg was shorter but did not address it. Will require further strengthening of lumbopelvic muscle group in order to stabilize with heel lift. Has recently felt like balance is decreasing and would like to work on this as well.   REHAB POTENTIAL: Good  CLINICAL DECISION MAKING: Evolving/moderate complexity  EVALUATION COMPLEXITY: Moderate   GOALS: Goals reviewed with patient? Yes  SHORT TERM GOALS:  STG Name Target Date Goal status  1 Independent with basic daily stretching/exercise program Baseline: to be established 10/15/21 INITIAL  2 Make decision on need for heel lift to stay in shoe Baseline: added 3-layer lift to Rt shoe at eval 10/15/21 INITIAL                            LONG TERM GOALS:   LTG Name Target Date Goal status  1 Pt will be able to get up and start walking with sensation of controlled balance Baseline: has felt off for a couple of weeks at eval 11/05/21 INITIAL  2 Gross hip strength to 5/5 Baseline:not appropriate to test at eval due to notable  innominate rotation 11/05/21 INITIAL  3 Resolution of ITB pain Baseline: acute on chronic issue 11/05/21 INITIAL  4 Ankle DF to at least 8 deg for necessary ROM in stance phase of gait Baseline:3 at eval 11/05/21 INITIAL  PLAN: PT FREQUENCY: 1-2x/week  PT DURATION: 6 weeks  PLANNED INTERVENTIONS: Therapeutic exercises, Therapeutic activity, Neuro Muscular re-education, Balance training, Gait training, Patient/Family education, Joint mobilization, Stair training, Aquatic Therapy, Dry Needling, Spinal mobilization, Cryotherapy, Moist heat, Taping, Traction, Ionotophoresis 39m/ml Dexamethasone, and Manual therapy  PLAN FOR NEXT SESSION: outcome of heel lift? Core & hip abd strengthening  Manessa Buley C. Isabellah Sobocinski PT, DPT 09/20/21 7:34 PM

## 2021-09-23 NOTE — Progress Notes (Signed)
I, Wendy Poet, LAT, ATC, am serving as scribe for Dr. Lynne Leader.  [name] is a 70 y.o. female who presents to [place] [facility] today for f/u of R hip pain thought to be due to greater trochanteric bursitis and hip abductor tendinopathy.  She was last seen by Dr. Georgina Snell on 08/16/21 and had a R GT injection.  She was also referred to PT of which she has completed 2 visit.  Today, pt reports tenderness along the lateral aspect of the R thigh, more distal than previously. Pt notes benefit from the steroid injection in the pain over the R GT. Pt reports the PT put a lift in her shoe, which she feels is helping.  Diagnostic imaging: L-spine and R hip XR- 08/16/21  Pertinent review of systems: No fevers or chills  Relevant historical information: History of chronic back pain.   Exam:  BP 132/86   Pulse 69   Ht 6' 0.5" (1.842 m)   Wt 225 lb 6.4 oz (102.2 kg)   SpO2 97%   BMI 30.15 kg/m  General: Well Developed, well nourished, and in no acute distress.   MSK: Right hip normal-appearing Mildly tender palpation greater trochanter. Right lateral knee minimally tender palpation at lateral femoral condyle at IT band.    Lab and Radiology Results   EXAM: DG HIP (WITH OR WITHOUT PELVIS) 2-3V RIGHT   COMPARISON:  08/04/2014 and the lumbar spine radiographs dictated separately.   FINDINGS: AP view of the pelvis and AP/frog leg views of the right hip. Degenerative sclerosis of both sacroiliac joints. Femoral heads are located. Again identified is radiopaque material projecting over the symphysis pubis which could be postsurgical.   No acute fracture.  Joint spaces maintained.   IMPRESSION: No acute osseous abnormality.     Electronically Signed   By: Abigail Miyamoto M.D.   On: 08/18/2021 09:19   EXAM: LUMBAR SPINE - 2-3 VIEW   COMPARISON:  Right hip radiographs, dictated separately.   FINDINGS: Mild degenerative sclerosis of both sacroiliac  joints. Five lumbar type vertebral bodies. Maintenance of vertebral body height and alignment. Marked loss of intervertebral disc height at L4-5 and L5-S1. Facet arthropathy at L4-5 and L5-S1.   IMPRESSION: Advanced spondylosis, without acute osseous abnormality.     Electronically Signed   By: Abigail Miyamoto M.D.   On: 08/18/2021 09:15   I, Lynne Leader, personally (independently) visualized and performed the interpretation of the images attached in this note.    Assessment and Plan: 70 y.o. female with right lateral hip pain mostly due to greater trochanteric bursitis.  Additionally she has some pain in the distal lateral thigh near the lateral knee this probably either referred pain from greater trochanteric bursitis or possibly IT band syndrome.  Differential does include L5 radiculopathy but this is less likely.  She did have benefit 6 weeks ago from a greater trochanter injection but is just started physical therapy now which should provide lasting benefit. Plan to continue PT and focus on home exercise program.  Plan to check back after PT ends which should be in about 6 weeks.  Total encounter time 20 minutes including face-to-face time with the patient and, reviewing past medical record, and charting on the date of service.   Treatment plan and options   Discussed warning signs or symptoms. Please see discharge instructions. Patient expresses understanding.   The above documentation has been reviewed and is accurate and complete Lynne Leader, M.D.

## 2021-09-24 ENCOUNTER — Encounter (HOSPITAL_BASED_OUTPATIENT_CLINIC_OR_DEPARTMENT_OTHER): Payer: Self-pay | Admitting: Physical Therapy

## 2021-09-24 ENCOUNTER — Other Ambulatory Visit: Payer: Self-pay

## 2021-09-24 ENCOUNTER — Ambulatory Visit (HOSPITAL_BASED_OUTPATIENT_CLINIC_OR_DEPARTMENT_OTHER): Payer: Medicare Other | Admitting: Physical Therapy

## 2021-09-24 DIAGNOSIS — M79604 Pain in right leg: Secondary | ICD-10-CM | POA: Diagnosis not present

## 2021-09-24 DIAGNOSIS — M6281 Muscle weakness (generalized): Secondary | ICD-10-CM

## 2021-09-24 DIAGNOSIS — R262 Difficulty in walking, not elsewhere classified: Secondary | ICD-10-CM

## 2021-09-24 DIAGNOSIS — G8929 Other chronic pain: Secondary | ICD-10-CM | POA: Diagnosis not present

## 2021-09-24 DIAGNOSIS — M25551 Pain in right hip: Secondary | ICD-10-CM | POA: Diagnosis not present

## 2021-09-24 NOTE — Therapy (Signed)
OUTPATIENT PHYSICAL TREATMENT   Patient Name: [name] MRN: [id number] DOB:23-Aug-1951, 70 y.o., female Today's Date: 09/24/2021   PT End of Session - 09/24/21 0841     Visit Number 2    Number of Visits 13    Date for PT Re-Evaluation 11/05/21    Authorization Type MCR- FOTO visit 6    Progress Note Due on Visit 10    PT Start Time 0805    PT Stop Time 0845    PT Time Calculation (min) 40 min    Activity Tolerance Patient tolerated treatment well    Behavior During Therapy [facility] for tasks assessed/performed              Past Medical History:  Diagnosis Date   Abnormal Pap smear of vagina 06/17/15   LGSIL   Allergic rhinitis, cause unspecified    Arthritis    ARTHRITIS, KNEE 02/10/2009   Qualifier: Diagnosis of  By: Niel Hummer MD, Willie R    BRCA negative 03/25/13   Cervical cancer ([place]) 1984   Cervical disc disease 02/11/2012   Chronic lower back pain    Degenerative arthritis of hip 02/11/2012   Diverticulosis 02/11/2012   GERD 08/10/2007   Qualifier: Diagnosis of  By: Sherlynn Stalls, CMA, Cindy     GERD (gastroesophageal reflux disease)    GERD (gastroesophageal reflux disease)    Hemorrhoids    HEMORRHOIDS-INTERNAL 07/21/2010   Qualifier: Diagnosis of  By: Chester Holstein NP, Paula     Hiatal hernia    Hyperlipidemia 02/13/2012   Mitral valve prolapse    no cardiologist, mild   OAB (overactive bladder)    Obesity    OVERACTIVE BLADDER 08/10/2007   Qualifier: Diagnosis of  By: Sherlynn Stalls, [place], [place]     Pneumonia 2017   PONV (postoperative nausea and vomiting)    yrs ago unable to raise arms after female surgery, no recent problems   Recurrent UTI    Sleep apnea    uses cpap set on 2   SUI (stress urinary incontinence, female)    Urinary incontinence    VAGINITIS, ATROPHIC 11/11/2008   Qualifier: Diagnosis of  By: Niel Hummer MD, Lorinda Creed    Past Surgical History:  Procedure Laterality Date   bladder tack     x 3 or 4   BUNIONECTOMY Right 2007   CERVICAL DISCECTOMY  2002    fusion and plate C 3   CHOLECYSTECTOMY N/A 08/13/2013   Procedure: LAPAROSCOPIC CHOLECYSTECTOMY WITH INTRAOPERATIVE CHOLANGIOGRAM;  Surgeon: Shann Medal, MD;  Location: [place];  Service: General;  Laterality: N/A;   CLOSED REDUCTION METATARSAL FRACTURE     rt side 06-03-18, left side 08-19-17   COLONOSCOPY  2018   conization of cervix  1982   with D&C   CYSTOCELE REPAIR  2003   rectocele repair   EYE SURGERY Bilateral yrs ago   lasik   ORIF ANKLE FRACTURE Right 08/20/2017   Procedure: OPEN REDUCTION INTERNAL FIXATION (ORIF) ANKLE FRACTURE;  Surgeon: Meredith Pel, MD;  Location: [facility];  Service: Orthopedics;  Laterality: Right;   SHOULDER ARTHROSCOPY Right 2004   frozen shoulder   TONSILLECTOMY  as child   adenoids removed   TRANSANAL HEMORRHOIDAL DEARTERIALIZATION N/A 07/30/2020   Procedure: TRANSANAL HEMORRHOIDAL DEARTERIALIZATION;  Surgeon: Leighton Ruff, MD;  Location: [place];  Service: General;  Laterality: N/A;   VAGINAL HYSTERECTOMY  1986   partial   Patient Active Problem List   Diagnosis Date Noted  Trochanteric bursitis of right hip 08/09/2021   Vitamin D deficiency 08/09/2020   Paresthesia of foot, bilateral 08/09/2020   Neuritis of right ulnar nerve 08/09/2020   Aortic atherosclerosis ([place]) 08/06/2020   Left cervical radiculopathy 10/07/2019   Bilateral low back pain with bilateral sciatica 10/07/2019   Acute upper respiratory infection 01/17/2019   Acute conjunctivitis of left eye 01/17/2019   Urinary frequency 01/09/2019   Cough 05/03/2018   Dysuria 03/28/2018   Trimalleolar fracture 08/20/2017   Ankle fracture 08/20/2017   OSA on CPAP 07/30/2017   Right hip pain 07/28/2017   Abnormal TSH 07/21/2015   Greater trochanteric bursitis of right hip 08/04/2014   Localized osteoarthrosis, lower leg 50/07/3817   Biliary colic 29/93/7169   Cholelithiasis 08/07/2013   Hyperlipidemia 02/13/2012   Impaired glucose tolerance 02/13/2012    Preventative health care 02/11/2012   Diverticulosis 02/11/2012   Degenerative arthritis of hip 02/11/2012   Cervical disc disease 02/11/2012   Allergic rhinitis    Chronic low back pain    Obesity    Blood in stool 08/31/2010   HEMORRHOIDS-INTERNAL 07/21/2010   CONSTIPATION 07/19/2010   ARTHRITIS, KNEE 02/10/2009   VAGINITIS, ATROPHIC 11/11/2008   EXOGENOUS OBESITY 07/29/2008   GERD 08/10/2007   OVERACTIVE BLADDER 08/10/2007    PCP: Biagio Borg, MD  REFERRING PROVIDER: Biagio Borg, MD  REFERRING DIAG: [phone] (ICD-10-CM) - Chronic right hip pain   THERAPY DIAG:  Pain in right leg  Muscle weakness (generalized)  Difficulty in walking, not elsewhere classified  ONSET DATE: about 6 mo ago recently but also about 4 yr ago  SUBJECTIVE:   SUBJECTIVE STATEMENT: Pt states she notices the heel lift prevents her R leg from rotating out. She states the pain is about the same.   PERTINENT HISTORY: Procedure: Real-time Ultrasound Guided Injection of right hip greater trochanter bursa on 08/16/2021  H/o bil ankle fx with Rt ORIF- stiffness noted  PAIN:  Are you having pain? Yes VAS scale: 2/10 Pain location: Rt lateral, distal thigh Pain orientation: Right  PAIN TYPE: aching Pain description: constant  Aggravating factors: walking about 2 miles creates numbness, stooping sometimes Relieving factors: meds  PRECAUTIONS: None  WEIGHT BEARING RESTRICTIONS No  OCCUPATION: retired  PLOF: Independent  PATIENT GOALS: improve balance, decrease pain   OBJECTIVE:   DIAGNOSTIC FINDINGS: Right hip: No significant right hip OA.  Radiolucencies present overlying pubic symphysis.   L-spine: DDD and facet DJD L4-L5 and L5-S1.  No acute fractures.  PATIENT SURVEYS:  FOTO 81    TODAY'S TREATMENT:  11/18 HS stretch R 30s 2x Seated abdominal bracing with trunk flexion (as per HEP) 15x Clamshell GTB 2x10 bilat Bridge with GRB at knees 2x10   Manual: STM R HS,  VL, Gluteals Joint mob: R SIJ innominate rotation MET with shotgun technique x2 (supine LLD improved, R LE still appears elevated)   Eval  Manual- anterior rotation Rt innominate in prone  Supine over towel roll along Rt sacral ILA   PATIENT EDUCATION:  Education details: anatomy, exercise progression, DOMS expectations, muscle firing,  envelope of function, HEP, POC  Person educated: Patient Education method: Explanation, Demonstration, Tactile cues, Verbal cues, and Handouts Education comprehension: verbalized understanding, returned demonstration, verbal cues required, tactile cues required, and needs further education   HOME EXERCISE PROGRAM: Access Code: [email or web address] URL: [email or web address] Date: 09/24/2021 Prepared by: Daleen Bo  Exercises Seated Hamstring Stretch - 1 x daily - 7 x weekly - 2 sets - 20s hold Seated Transversus  Abdominis Bracing - 1 x daily - 7 x weekly Clamshell with Resistance - 1 x daily - 7 x weekly - 3 sets - 10 reps Supine Bridge - 1 x daily - 7 x weekly - 3 sets - 10 reps   ASSESSMENT:  CLINICAL IMPRESSION: Pt presents today with report of improved gait with 3 layer R LE heel lift. Pt does present with SIJ rotation in supine and was improved with SIJ shotgun MET x2. Pt to continue with heel lift due to report of unsteadiness without. Pt able to introduce lumbopelvic strengthening exercise today without increased pain. Pt with significant lateral LE tightness that was improved with manual therapy. Plan to continue with lumbopelvic strength at next session.  Objective impairments include decreased activity tolerance, decreased balance, difficulty walking, decreased strength, increased muscle spasms, impaired flexibility, improper body mechanics, postural dysfunction, and pain. These impairments are limiting patient from cleaning, community activity, meal prep, and shopping. Personal factors including 1-2 comorbidities: acute on chronic hip  pain, LBP, h/o bil ankle fx-Rt ORIF, sensation of decreasing balance  are also affecting patient's functional outcome. Patient will benefit from skilled PT to address above impairments and improve overall function.    REHAB POTENTIAL: Good  CLINICAL DECISION MAKING: Evolving/moderate complexity  EVALUATION COMPLEXITY: Moderate   GOALS: Goals reviewed with patient? Yes  SHORT TERM GOALS:  STG Name Target Date Goal status  1 Independent with basic daily stretching/exercise program Baseline: to be established 10/15/21 INITIAL  2 Make decision on need for heel lift to stay in shoe Baseline: added 3-layer lift to Rt shoe at eval 10/15/21 INITIAL                            LONG TERM GOALS:   LTG Name Target Date Goal status  1 Pt will be able to get up and start walking with sensation of controlled balance Baseline: has felt off for a couple of weeks at eval 11/05/21 INITIAL  2 Gross hip strength to 5/5 Baseline:not appropriate to test at eval due to notable innominate rotation 11/05/21 INITIAL  3 Resolution of ITB pain Baseline: acute on chronic issue 11/05/21 INITIAL  4 Ankle DF to at least 8 deg for necessary ROM in stance phase of gait Baseline:3 at eval 11/05/21 INITIAL                  PLAN: PT FREQUENCY: 1-2x/week  PT DURATION: 6 weeks  PLANNED INTERVENTIONS: Therapeutic exercises, Therapeutic activity, Neuro Muscular re-education, Balance training, Gait training, Patient/Family education, Joint mobilization, Stair training, Aquatic Therapy, Dry Needling, Spinal mobilization, Cryotherapy, Moist heat, Taping, Traction, Ionotophoresis 76m/ml Dexamethasone, and Manual therapy  PLAN FOR NEXT SESSION: Core & hip abd strengthening  ADaleen BoPT, DPT 09/24/21 10:17 AM

## 2021-09-27 ENCOUNTER — Other Ambulatory Visit: Payer: Self-pay

## 2021-09-27 ENCOUNTER — Ambulatory Visit (INDEPENDENT_AMBULATORY_CARE_PROVIDER_SITE_OTHER): Payer: Medicare Other | Admitting: Family Medicine

## 2021-09-27 VITALS — BP 132/86 | HR 69 | Ht 72.5 in | Wt 225.4 lb

## 2021-09-27 DIAGNOSIS — M545 Low back pain, unspecified: Secondary | ICD-10-CM | POA: Diagnosis not present

## 2021-09-27 DIAGNOSIS — G8929 Other chronic pain: Secondary | ICD-10-CM

## 2021-09-27 DIAGNOSIS — M25551 Pain in right hip: Secondary | ICD-10-CM

## 2021-09-27 NOTE — Patient Instructions (Addendum)
Thank you for coming in today.   Continue physical therapy   Recheck back in 6 weeks.

## 2021-09-28 ENCOUNTER — Encounter (HOSPITAL_BASED_OUTPATIENT_CLINIC_OR_DEPARTMENT_OTHER): Payer: Medicare Other | Admitting: Physical Therapy

## 2021-09-28 DIAGNOSIS — Z20828 Contact with and (suspected) exposure to other viral communicable diseases: Secondary | ICD-10-CM | POA: Diagnosis not present

## 2021-10-05 ENCOUNTER — Encounter (HOSPITAL_BASED_OUTPATIENT_CLINIC_OR_DEPARTMENT_OTHER): Payer: Medicare Other | Admitting: Physical Therapy

## 2021-10-07 ENCOUNTER — Encounter (HOSPITAL_BASED_OUTPATIENT_CLINIC_OR_DEPARTMENT_OTHER): Payer: Self-pay | Admitting: Physical Therapy

## 2021-10-07 ENCOUNTER — Ambulatory Visit (HOSPITAL_BASED_OUTPATIENT_CLINIC_OR_DEPARTMENT_OTHER): Payer: Medicare Other | Attending: Family Medicine | Admitting: Physical Therapy

## 2021-10-07 ENCOUNTER — Other Ambulatory Visit: Payer: Self-pay

## 2021-10-07 DIAGNOSIS — M6281 Muscle weakness (generalized): Secondary | ICD-10-CM | POA: Diagnosis not present

## 2021-10-07 DIAGNOSIS — R262 Difficulty in walking, not elsewhere classified: Secondary | ICD-10-CM | POA: Diagnosis not present

## 2021-10-07 DIAGNOSIS — M79604 Pain in right leg: Secondary | ICD-10-CM | POA: Diagnosis not present

## 2021-10-07 NOTE — Therapy (Signed)
OUTPATIENT PHYSICAL TREATMENT   Patient Name: [name] MRN: [id number] DOB:[birth year], 70 y.o., female Today's Date: 10/07/2021   PT End of Session - 10/07/21 1113     Visit Number 3    Number of Visits 13    Date for PT Re-Evaluation 11/05/21    Authorization Type MCR- FOTO visit 6    Progress Note Due on Visit 10    PT Start Time 1105    PT Stop Time 2010    PT Time Calculation (min) 40 min    Activity Tolerance Patient tolerated treatment well    Behavior During Therapy [facility] for tasks assessed/performed               Past Medical History:  Diagnosis Date   Abnormal Pap smear of vagina 06/17/15   LGSIL   Allergic rhinitis, cause unspecified    Arthritis    ARTHRITIS, KNEE 02/10/2009   Qualifier: Diagnosis of  By: Niel Hummer MD, Willie R    BRCA negative 03/25/13   Cervical cancer ([place]) 1984   Cervical disc disease 02/11/2012   Chronic lower back pain    Degenerative arthritis of hip 02/11/2012   Diverticulosis 02/11/2012   GERD 08/10/2007   Qualifier: Diagnosis of  By: Sherlynn Stalls, CMA, Cindy     GERD (gastroesophageal reflux disease)    GERD (gastroesophageal reflux disease)    Hemorrhoids    HEMORRHOIDS-INTERNAL 07/21/2010   Qualifier: Diagnosis of  By: Chester Holstein NP, Paula     Hiatal hernia    Hyperlipidemia 02/13/2012   Mitral valve prolapse    no cardiologist, mild   OAB (overactive bladder)    Obesity    OVERACTIVE BLADDER 08/10/2007   Qualifier: Diagnosis of  By: Sherlynn Stalls, [place], [place]     Pneumonia 2017   PONV (postoperative nausea and vomiting)    yrs ago unable to raise arms after female surgery, no recent problems   Recurrent UTI    Sleep apnea    uses cpap set on 2   SUI (stress urinary incontinence, female)    Urinary incontinence    VAGINITIS, ATROPHIC 11/11/2008   Qualifier: Diagnosis of  By: Niel Hummer MD, Lorinda Creed    Past Surgical History:  Procedure Laterality Date   bladder tack     x 3 or 4   BUNIONECTOMY Right 2007   CERVICAL DISCECTOMY  2002    fusion and plate C 3   CHOLECYSTECTOMY N/A 08/13/2013   Procedure: LAPAROSCOPIC CHOLECYSTECTOMY WITH INTRAOPERATIVE CHOLANGIOGRAM;  Surgeon: Shann Medal, MD;  Location: [place];  Service: General;  Laterality: N/A;   CLOSED REDUCTION METATARSAL FRACTURE     rt side 06-03-18, left side 08-19-17   COLONOSCOPY  2018   conization of cervix  1982   with D&C   CYSTOCELE REPAIR  2003   rectocele repair   EYE SURGERY Bilateral yrs ago   lasik   ORIF ANKLE FRACTURE Right 08/20/2017   Procedure: OPEN REDUCTION INTERNAL FIXATION (ORIF) ANKLE FRACTURE;  Surgeon: Meredith Pel, MD;  Location: [facility];  Service: Orthopedics;  Laterality: Right;   SHOULDER ARTHROSCOPY Right 2004   frozen shoulder   TONSILLECTOMY  as child   adenoids removed   TRANSANAL HEMORRHOIDAL DEARTERIALIZATION N/A 07/30/2020   Procedure: TRANSANAL HEMORRHOIDAL DEARTERIALIZATION;  Surgeon: Leighton Ruff, MD;  Location: [place];  Service: General;  Laterality: N/A;   VAGINAL HYSTERECTOMY  1986   partial   Patient Active Problem List   Diagnosis Date Noted  Trochanteric bursitis of right hip 08/09/2021   Vitamin D deficiency 08/09/2020   Paresthesia of foot, bilateral 08/09/2020   Neuritis of right ulnar nerve 08/09/2020   Aortic atherosclerosis ([place]) 08/06/2020   Left cervical radiculopathy 10/07/2019   Bilateral low back pain with bilateral sciatica 10/07/2019   Acute upper respiratory infection 01/17/2019   Acute conjunctivitis of left eye 01/17/2019   Urinary frequency 01/09/2019   Dysuria 03/28/2018   Trimalleolar fracture 08/20/2017   Ankle fracture 08/20/2017   OSA on CPAP 07/30/2017   Right hip pain 07/28/2017   Abnormal TSH 07/21/2015   Greater trochanteric bursitis of right hip 08/04/2014   Localized osteoarthrosis, lower leg 94/49/6759   Biliary colic 16/38/4665   Cholelithiasis 08/07/2013   Hyperlipidemia 02/13/2012   Impaired glucose tolerance 02/13/2012   Preventative health  care 02/11/2012   Diverticulosis 02/11/2012   Degenerative arthritis of hip 02/11/2012   Cervical disc disease 02/11/2012   Allergic rhinitis    Chronic low back pain    Obesity    Blood in stool 08/31/2010   HEMORRHOIDS-INTERNAL 07/21/2010   CONSTIPATION 07/19/2010   ARTHRITIS, KNEE 02/10/2009   VAGINITIS, ATROPHIC 11/11/2008   EXOGENOUS OBESITY 07/29/2008   GERD 08/10/2007   OVERACTIVE BLADDER 08/10/2007    PCP: Biagio Borg, MD  REFERRING PROVIDER: Biagio Borg, MD  REFERRING DIAG: [phone] (ICD-10-CM) - Chronic right hip pain   THERAPY DIAG:  Pain in right leg  Muscle weakness (generalized)  Difficulty in walking, not elsewhere classified  ONSET DATE: about 6 mo ago recently but also about 4 yr ago  SUBJECTIVE:   SUBJECTIVE STATEMENT: Pt states the L lateral thigh is better than last time. Still will have NT but not as much soreness.   PERTINENT HISTORY: Procedure: Real-time Ultrasound Guided Injection of right hip greater trochanter bursa on 08/16/2021  H/o bil ankle fx with Rt ORIF- stiffness noted  PAIN:  Are you having pain? No VAS scale: 0/10 Pain location: Rt lateral, distal thigh Pain orientation: Right  PAIN TYPE: aching Pain description: constant  Aggravating factors: walking about 2 miles creates numbness, stooping sometimes Relieving factors: meds  PRECAUTIONS: None  WEIGHT BEARING RESTRICTIONS No  OCCUPATION: retired  PLOF: Independent  PATIENT GOALS: improve balance, decrease pain   OBJECTIVE:   DIAGNOSTIC FINDINGS: Right hip: No significant right hip OA.  Radiolucencies present overlying pubic symphysis.   L-spine: DDD and facet DJD L4-L5 and L5-S1.  No acute fractures.  PATIENT SURVEYS:  FOTO 81    TODAY'S TREATMENT:  12/1  Active supine HS stretch R 30s 2x Seated abdominal bracing with trunk flexion (as per HEP) 15x Clamshell BTB 2x10 bilat 3s hold Bridge with BTB at knees 2x15 Supine piriformis stretch  pushing down 30s 3x Standing stair hip hike 2x10 tandem foam balance R foot in rear 30s 4x   11/18 HS stretch R 30s 2x Seated abdominal bracing with trunk flexion (as per HEP) 15x Clamshell GTB 2x10 bilat Bridge with GRB at knees 2x10   Manual: STM R HS, VL, Gluteals Joint mob: R SIJ innominate rotation MET with shotgun technique x2 (supine LLD improved, R LE still appears elevated)   Eval  Manual- anterior rotation Rt innominate in prone  Supine over towel roll along Rt sacral ILA   PATIENT EDUCATION:  Education details: anatomy, exercise progression, DOMS expectations, muscle firing,  envelope of function, HEP, POC  Person educated: Patient Education method: Explanation, Demonstration, Tactile cues, Verbal cues, and Handouts Education comprehension: verbalized understanding, returned  demonstration, verbal cues required, tactile cues required, and needs further education   HOME EXERCISE PROGRAM: Access Code: [email or web address] URL: [email or web address] Date: 10/07/2021 Prepared by: Daleen Bo  Exercises Seated Hamstring Stretch - 1 x daily - 7 x weekly - 2 sets - 20s hold Supine Figure 4 Piriformis Stretch - 1 x daily - 7 x weekly - 1 sets - 3 reps - 30 hold Clamshell with Resistance - 1 x daily - 3-4 x weekly - 3 sets - 10 reps Supine Bridge with Resistance Band - 1 x daily - 3-4 x weekly - 3 sets - 10 reps Supine Bridge with Mini Swiss Ball Between Knees - 1 x daily - 3-4 x weekly - 3 sets - 10 reps    ASSESSMENT:  CLINICAL IMPRESSION: Pt able to progress strength exercise at today's session and increase stretching for lateral hip soft tissue restriction today. Pt progressed HEP and included standing balance activity. Pt with difficulty with SLS and pliant surface training suggesting proximal motor control deficits at the hip bilaterally. Pt with good tolerance to CKC strengthening without increased pain and pt reported decreased stiffness by end of session. Plan  to progress balance and add in sidestepping at next session.   Objective impairments include decreased activity tolerance, decreased balance, difficulty walking, decreased strength, increased muscle spasms, impaired flexibility, improper body mechanics, postural dysfunction, and pain. These impairments are limiting patient from cleaning, community activity, meal prep, and shopping. Personal factors including 1-2 comorbidities: acute on chronic hip pain, LBP, h/o bil ankle fx-Rt ORIF, sensation of decreasing balance  are also affecting patient's functional outcome. Patient will benefit from skilled PT to address above impairments and improve overall function.    REHAB POTENTIAL: Good  CLINICAL DECISION MAKING: Evolving/moderate complexity  EVALUATION COMPLEXITY: Moderate   GOALS: Goals reviewed with patient? Yes  SHORT TERM GOALS:  STG Name Target Date Goal status  1 Independent with basic daily stretching/exercise program Baseline: to be established 10/15/21 INITIAL  2 Make decision on need for heel lift to stay in shoe Baseline: added 3-layer lift to Rt shoe at eval 10/15/21 INITIAL                            LONG TERM GOALS:   LTG Name Target Date Goal status  1 Pt will be able to get up and start walking with sensation of controlled balance Baseline: has felt off for a couple of weeks at eval 11/05/21 INITIAL  2 Gross hip strength to 5/5 Baseline:not appropriate to test at eval due to notable innominate rotation 11/05/21 INITIAL  3 Resolution of ITB pain Baseline: acute on chronic issue 11/05/21 INITIAL  4 Ankle DF to at least 8 deg for necessary ROM in stance phase of gait Baseline:3 at eval 11/05/21 INITIAL                  PLAN: PT FREQUENCY: 1-2x/week  PT DURATION: 6 weeks  PLANNED INTERVENTIONS: Therapeutic exercises, Therapeutic activity, Neuro Muscular re-education, Balance training, Gait training, Patient/Family education, Joint mobilization, Stair training,  Aquatic Therapy, Dry Needling, Spinal mobilization, Cryotherapy, Moist heat, Taping, Traction, Ionotophoresis 3m/ml Dexamethasone, and Manual therapy  PLAN FOR NEXT SESSION: Core & hip abd strengthening  ADaleen BoPT, DPT 10/07/21 11:46 AM

## 2021-10-11 ENCOUNTER — Encounter (HOSPITAL_BASED_OUTPATIENT_CLINIC_OR_DEPARTMENT_OTHER): Payer: Self-pay | Admitting: Physical Therapy

## 2021-10-11 ENCOUNTER — Ambulatory Visit (HOSPITAL_BASED_OUTPATIENT_CLINIC_OR_DEPARTMENT_OTHER): Payer: Medicare Other | Admitting: Physical Therapy

## 2021-10-11 ENCOUNTER — Other Ambulatory Visit: Payer: Self-pay

## 2021-10-11 DIAGNOSIS — M79604 Pain in right leg: Secondary | ICD-10-CM

## 2021-10-11 DIAGNOSIS — R262 Difficulty in walking, not elsewhere classified: Secondary | ICD-10-CM

## 2021-10-11 DIAGNOSIS — M6281 Muscle weakness (generalized): Secondary | ICD-10-CM

## 2021-10-11 NOTE — Therapy (Signed)
OUTPATIENT PHYSICAL TREATMENT   Patient Name: [name] MRN: [id number] DOB:11-18-1950, 70 y.o., female Today's Date: 10/11/2021   PT End of Session - 10/11/21 0852     Visit Number 4    Number of Visits 13    Date for PT Re-Evaluation 11/05/21    Authorization Type MCR- FOTO visit 6    Progress Note Due on Visit 10    PT Start Time 0845    PT Stop Time 0925    PT Time Calculation (min) 40 min    Activity Tolerance Patient tolerated treatment well    Behavior During Therapy [facility] for tasks assessed/performed                Past Medical History:  Diagnosis Date   Abnormal Pap smear of vagina 06/17/15   LGSIL   Allergic rhinitis, cause unspecified    Arthritis    ARTHRITIS, KNEE 02/10/2009   Qualifier: Diagnosis of  By: Niel Hummer MD, Willie R    BRCA negative 03/25/13   Cervical cancer ([place]) 1984   Cervical disc disease 02/11/2012   Chronic lower back pain    Degenerative arthritis of hip 02/11/2012   Diverticulosis 02/11/2012   GERD 08/10/2007   Qualifier: Diagnosis of  By: Sherlynn Stalls, CMA, Cindy     GERD (gastroesophageal reflux disease)    GERD (gastroesophageal reflux disease)    Hemorrhoids    HEMORRHOIDS-INTERNAL 07/21/2010   Qualifier: Diagnosis of  By: Chester Holstein NP, Paula     Hiatal hernia    Hyperlipidemia 02/13/2012   Mitral valve prolapse    no cardiologist, mild   OAB (overactive bladder)    Obesity    OVERACTIVE BLADDER 08/10/2007   Qualifier: Diagnosis of  By: Sherlynn Stalls, [place], [place]     Pneumonia 2017   PONV (postoperative nausea and vomiting)    yrs ago unable to raise arms after female surgery, no recent problems   Recurrent UTI    Sleep apnea    uses cpap set on 2   SUI (stress urinary incontinence, female)    Urinary incontinence    VAGINITIS, ATROPHIC 11/11/2008   Qualifier: Diagnosis of  By: Niel Hummer MD, Lorinda Creed    Past Surgical History:  Procedure Laterality Date   bladder tack     x 3 or 4   BUNIONECTOMY Right 2007   CERVICAL DISCECTOMY  2002    fusion and plate C 3   CHOLECYSTECTOMY N/A 08/13/2013   Procedure: LAPAROSCOPIC CHOLECYSTECTOMY WITH INTRAOPERATIVE CHOLANGIOGRAM;  Surgeon: Shann Medal, MD;  Location: [place];  Service: General;  Laterality: N/A;   CLOSED REDUCTION METATARSAL FRACTURE     rt side 06-03-18, left side 08-19-17   COLONOSCOPY  2018   conization of cervix  1982   with D&C   CYSTOCELE REPAIR  2003   rectocele repair   EYE SURGERY Bilateral yrs ago   lasik   ORIF ANKLE FRACTURE Right 08/20/2017   Procedure: OPEN REDUCTION INTERNAL FIXATION (ORIF) ANKLE FRACTURE;  Surgeon: Meredith Pel, MD;  Location: [facility];  Service: Orthopedics;  Laterality: Right;   SHOULDER ARTHROSCOPY Right 2004   frozen shoulder   TONSILLECTOMY  as child   adenoids removed   TRANSANAL HEMORRHOIDAL DEARTERIALIZATION N/A 07/30/2020   Procedure: TRANSANAL HEMORRHOIDAL DEARTERIALIZATION;  Surgeon: Leighton Ruff, MD;  Location: [place];  Service: General;  Laterality: N/A;   VAGINAL HYSTERECTOMY  1986   partial   Patient Active Problem List   Diagnosis Date  Noted   Trochanteric bursitis of right hip 08/09/2021   Vitamin D deficiency 08/09/2020   Paresthesia of foot, bilateral 08/09/2020   Neuritis of right ulnar nerve 08/09/2020   Aortic atherosclerosis ([place]) 08/06/2020   Left cervical radiculopathy 10/07/2019   Bilateral low back pain with bilateral sciatica 10/07/2019   Acute upper respiratory infection 01/17/2019   Acute conjunctivitis of left eye 01/17/2019   Urinary frequency 01/09/2019   Dysuria 03/28/2018   Trimalleolar fracture 08/20/2017   Ankle fracture 08/20/2017   OSA on CPAP 07/30/2017   Right hip pain 07/28/2017   Abnormal TSH 07/21/2015   Greater trochanteric bursitis of right hip 08/04/2014   Localized osteoarthrosis, lower leg 50/35/4656   Biliary colic 81/27/5170   Cholelithiasis 08/07/2013   Hyperlipidemia 02/13/2012   Impaired glucose tolerance 02/13/2012   Preventative health  care 02/11/2012   Diverticulosis 02/11/2012   Degenerative arthritis of hip 02/11/2012   Cervical disc disease 02/11/2012   Allergic rhinitis    Chronic low back pain    Obesity    Blood in stool 08/31/2010   HEMORRHOIDS-INTERNAL 07/21/2010   CONSTIPATION 07/19/2010   ARTHRITIS, KNEE 02/10/2009   VAGINITIS, ATROPHIC 11/11/2008   EXOGENOUS OBESITY 07/29/2008   GERD 08/10/2007   OVERACTIVE BLADDER 08/10/2007    PCP: Biagio Borg, MD  REFERRING PROVIDER: Biagio Borg, MD  REFERRING DIAG: [phone] (ICD-10-CM) - Chronic right hip pain   THERAPY DIAG:  Pain in right leg  Muscle weakness (generalized)  Difficulty in walking, not elsewhere classified  ONSET DATE: about 6 mo ago recently but also about 4 yr ago  SUBJECTIVE:   SUBJECTIVE STATEMENT: Pt states no pain with HEP. Pt states that pressure causes tenderness but it is much better. She has not had pain since last visit.   PERTINENT HISTORY: Procedure: Real-time Ultrasound Guided Injection of right hip greater trochanter bursa on 08/16/2021  H/o bil ankle fx with Rt ORIF- stiffness noted  PAIN:  Are you having pain? No VAS scale: 0/10 Pain location: Rt lateral, distal thigh Pain orientation: Right  PAIN TYPE: aching Pain description: constant  Aggravating factors: walking about 2 miles creates numbness, stooping sometimes Relieving factors: meds  PRECAUTIONS: None  WEIGHT BEARING RESTRICTIONS No  OCCUPATION: retired  PLOF: Independent  PATIENT GOALS: improve balance, decrease pain   OBJECTIVE:   DIAGNOSTIC FINDINGS: Right hip: No significant right hip OA.  Radiolucencies present overlying pubic symphysis.   L-spine: DDD and facet DJD L4-L5 and L5-S1.  No acute fractures.  PATIENT SURVEYS:  FOTO 81    TODAY'S TREATMENT:  12/5    LTR 3s 10x   Seated QL stretch 30s 2x each Active supine HS stretch R 30s 2x Clamshell BTB 2x10 bilat 3s hold Bridge knees swissball squeeze 2x15 Supine  piriformis stretch pushing down 30s 3x Sidestepping RTB at knees 51f x2 laps tandem foam balance bilat 30s 4x SLS with toe touch cued for level pelvic  12/1  Active supine HS stretch R 30s 2x Seated abdominal bracing with trunk flexion (as per HEP) 15x Clamshell BTB 2x10 bilat 3s hold Bridge with BTB at knees 2x15 Supine piriformis stretch pushing down 30s 3x Standing stair hip hike 2x10 tandem foam balance bilat 30s 4x    11/18 HS stretch R 30s 2x Seated abdominal bracing with trunk flexion (as per HEP) 15x Clamshell GTB 2x10 bilat Bridge with GRB at knees 2x10   Manual: STM R HS, VL, Gluteals Joint mob: R SIJ innominate rotation MET with shotgun technique  x2 (supine LLD improved, R LE still appears elevated)   Eval  Manual- anterior rotation Rt innominate in prone  Supine over towel roll along Rt sacral ILA   PATIENT EDUCATION:  Education details: anatomy, exercise progression, DOMS expectations, muscle firing,  envelope of function, HEP, POC  Person educated: Patient Education method: Explanation, Demonstration, Tactile cues, Verbal cues, and Handouts Education comprehension: verbalized understanding, returned demonstration, verbal cues required, tactile cues required, and needs further education   HOME EXERCISE PROGRAM: Access Code: [email or web address] URL: [email or web address] Date: 10/11/2021 Prepared by: Daleen Bo  Exercises Seated Hamstring Stretch - 1 x daily - 7 x weekly - 2 sets - 20s hold Single Leg Stance - 1 x daily - 7 x weekly - 1 sets - 4 reps - 20s hold Supine Figure 4 Piriformis Stretch - 1 x daily - 7 x weekly - 1 sets - 3 reps - 30 hold Clamshell with Resistance - 1 x daily - 3-4 x weekly - 3 sets - 10 reps Supine Bridge with Resistance Band - 1 x daily - 3-4 x weekly - 3 sets - 10 reps     ASSESSMENT:  CLINICAL IMPRESSION: Pt session used to review previous exercise as well as introduce lumbar focused mobility exercise as pt  presents with lumbar stiffness on the R likely contributing to R sided hip symptoms. Pt with improved static balance on pliant surface and single limb.  Pt able to perform CKC hip ABD strengthening at today's session with reduced weight band due to bilateral hip endurance deficits. Plan to trial SL/staggered stance exercises and adding in lumbar  flexibility to HEP.  Objective impairments include decreased activity tolerance, decreased balance, difficulty walking, decreased strength, increased muscle spasms, impaired flexibility, improper body mechanics, postural dysfunction, and pain. These impairments are limiting patient from cleaning, community activity, meal prep, and shopping. Personal factors including 1-2 comorbidities: acute on chronic hip pain, LBP, h/o bil ankle fx-Rt ORIF, sensation of decreasing balance  are also affecting patient's functional outcome. Patient will benefit from skilled PT to address above impairments and improve overall function.    REHAB POTENTIAL: Good  CLINICAL DECISION MAKING: Evolving/moderate complexity  EVALUATION COMPLEXITY: Moderate   GOALS: Goals reviewed with patient? Yes  SHORT TERM GOALS:  STG Name Target Date Goal status  1 Independent with basic daily stretching/exercise program Baseline: to be established 10/15/21 INITIAL  2 Make decision on need for heel lift to stay in shoe Baseline: added 3-layer lift to Rt shoe at eval 10/15/21 INITIAL                            LONG TERM GOALS:   LTG Name Target Date Goal status  1 Pt will be able to get up and start walking with sensation of controlled balance Baseline: has felt off for a couple of weeks at eval 11/05/21 INITIAL  2 Gross hip strength to 5/5 Baseline:not appropriate to test at eval due to notable innominate rotation 11/05/21 INITIAL  3 Resolution of ITB pain Baseline: acute on chronic issue 11/05/21 INITIAL  4 Ankle DF to at least 8 deg for necessary ROM in stance phase of  gait Baseline:3 at eval 11/05/21 INITIAL                  PLAN: PT FREQUENCY: 1-2x/week  PT DURATION: 6 weeks  PLANNED INTERVENTIONS: Therapeutic exercises, Therapeutic activity, Neuro Muscular re-education, Balance training, Gait training, Patient/Family education, Joint mobilization, Stair  training, Aquatic Therapy, Dry Needling, Spinal mobilization, Cryotherapy, Moist heat, Taping, Traction, Ionotophoresis 57m/ml Dexamethasone, and Manual therapy  PLAN FOR NEXT SESSION: Core & hip abd strengthening  ADaleen BoPT, DPT 10/11/21 9:25 AM

## 2021-10-13 ENCOUNTER — Encounter (HOSPITAL_BASED_OUTPATIENT_CLINIC_OR_DEPARTMENT_OTHER): Payer: Self-pay | Admitting: Physical Therapy

## 2021-10-13 ENCOUNTER — Ambulatory Visit (HOSPITAL_BASED_OUTPATIENT_CLINIC_OR_DEPARTMENT_OTHER): Payer: Medicare Other | Admitting: Physical Therapy

## 2021-10-13 ENCOUNTER — Other Ambulatory Visit: Payer: Self-pay

## 2021-10-13 DIAGNOSIS — M79604 Pain in right leg: Secondary | ICD-10-CM | POA: Diagnosis not present

## 2021-10-13 DIAGNOSIS — M6281 Muscle weakness (generalized): Secondary | ICD-10-CM | POA: Diagnosis not present

## 2021-10-13 DIAGNOSIS — R262 Difficulty in walking, not elsewhere classified: Secondary | ICD-10-CM | POA: Diagnosis not present

## 2021-10-13 NOTE — Therapy (Signed)
OUTPATIENT PHYSICAL TREATMENT   Patient Name: [name] MRN: [id number] DOB:January 13, 1951, 70 y.o., female Today's Date: 10/13/2021   PT End of Session - 10/13/21 0942     Visit Number 5    Number of Visits 13    Date for PT Re-Evaluation 11/05/21    Authorization Type MCR- FOTO visit 6    Progress Note Due on Visit 10    PT Start Time 0930    PT Stop Time 1010    PT Time Calculation (min) 40 min    Activity Tolerance Patient tolerated treatment well    Behavior During Therapy [facility] for tasks assessed/performed                 Past Medical History:  Diagnosis Date   Abnormal Pap smear of vagina 06/17/15   LGSIL   Allergic rhinitis, cause unspecified    Arthritis    ARTHRITIS, KNEE 02/10/2009   Qualifier: Diagnosis of  By: Niel Hummer MD, Willie R    BRCA negative 03/25/13   Cervical cancer ([place]) 1984   Cervical disc disease 02/11/2012   Chronic lower back pain    Degenerative arthritis of hip 02/11/2012   Diverticulosis 02/11/2012   GERD 08/10/2007   Qualifier: Diagnosis of  By: Sherlynn Stalls, CMA, Cindy     GERD (gastroesophageal reflux disease)    GERD (gastroesophageal reflux disease)    Hemorrhoids    HEMORRHOIDS-INTERNAL 07/21/2010   Qualifier: Diagnosis of  By: Chester Holstein NP, Paula     Hiatal hernia    Hyperlipidemia 02/13/2012   Mitral valve prolapse    no cardiologist, mild   OAB (overactive bladder)    Obesity    OVERACTIVE BLADDER 08/10/2007   Qualifier: Diagnosis of  By: Sherlynn Stalls, [place], [place]     Pneumonia 2017   PONV (postoperative nausea and vomiting)    yrs ago unable to raise arms after female surgery, no recent problems   Recurrent UTI    Sleep apnea    uses cpap set on 2   SUI (stress urinary incontinence, female)    Urinary incontinence    VAGINITIS, ATROPHIC 11/11/2008   Qualifier: Diagnosis of  By: Niel Hummer MD, Lorinda Creed    Past Surgical History:  Procedure Laterality Date   bladder tack     x 3 or 4   BUNIONECTOMY Right 2007   CERVICAL DISCECTOMY   2002   fusion and plate C 3   CHOLECYSTECTOMY N/A 08/13/2013   Procedure: LAPAROSCOPIC CHOLECYSTECTOMY WITH INTRAOPERATIVE CHOLANGIOGRAM;  Surgeon: Shann Medal, MD;  Location: [place];  Service: General;  Laterality: N/A;   CLOSED REDUCTION METATARSAL FRACTURE     rt side 06-03-18, left side 08-19-17   COLONOSCOPY  2018   conization of cervix  1982   with D&C   CYSTOCELE REPAIR  2003   rectocele repair   EYE SURGERY Bilateral yrs ago   lasik   ORIF ANKLE FRACTURE Right 08/20/2017   Procedure: OPEN REDUCTION INTERNAL FIXATION (ORIF) ANKLE FRACTURE;  Surgeon: Meredith Pel, MD;  Location: [facility];  Service: Orthopedics;  Laterality: Right;   SHOULDER ARTHROSCOPY Right 2004   frozen shoulder   TONSILLECTOMY  as child   adenoids removed   TRANSANAL HEMORRHOIDAL DEARTERIALIZATION N/A 07/30/2020   Procedure: TRANSANAL HEMORRHOIDAL DEARTERIALIZATION;  Surgeon: Leighton Ruff, MD;  Location: [place];  Service: General;  Laterality: N/A;   [place]   partial   Patient Active Problem List   Diagnosis  Date Noted   Trochanteric bursitis of right hip 08/09/2021   Vitamin D deficiency 08/09/2020   Paresthesia of foot, bilateral 08/09/2020   Neuritis of right ulnar nerve 08/09/2020   Aortic atherosclerosis ([place]) 08/06/2020   Left cervical radiculopathy 10/07/2019   Bilateral low back pain with bilateral sciatica 10/07/2019   Acute upper respiratory infection 01/17/2019   Acute conjunctivitis of left eye 01/17/2019   Urinary frequency 01/09/2019   Dysuria 03/28/2018   Trimalleolar fracture 08/20/2017   Ankle fracture 08/20/2017   OSA on CPAP 07/30/2017   Right hip pain 07/28/2017   Abnormal TSH 07/21/2015   Greater trochanteric bursitis of right hip 08/04/2014   Localized osteoarthrosis, lower leg 14/48/1856   Biliary colic 31/49/7026   Cholelithiasis 08/07/2013   Hyperlipidemia 02/13/2012   Impaired glucose tolerance 02/13/2012   Preventative  health care 02/11/2012   Diverticulosis 02/11/2012   Degenerative arthritis of hip 02/11/2012   Cervical disc disease 02/11/2012   Allergic rhinitis    Chronic low back pain    Obesity    Blood in stool 08/31/2010   HEMORRHOIDS-INTERNAL 07/21/2010   CONSTIPATION 07/19/2010   ARTHRITIS, KNEE 02/10/2009   VAGINITIS, ATROPHIC 11/11/2008   EXOGENOUS OBESITY 07/29/2008   GERD 08/10/2007   OVERACTIVE BLADDER 08/10/2007    PCP: Biagio Borg, MD  REFERRING PROVIDER: Biagio Borg, MD  REFERRING DIAG: [phone] (ICD-10-CM) - Chronic right hip pain   THERAPY DIAG:  Pain in right leg  Muscle weakness (generalized)  Difficulty in walking, not elsewhere classified  ONSET DATE: about 6 mo ago recently but also about 4 yr ago  SUBJECTIVE:   SUBJECTIVE STATEMENT: Pt states she is sore today into her hips and her back today.   PERTINENT HISTORY: Procedure: Real-time Ultrasound Guided Injection of right hip greater trochanter bursa on 08/16/2021  H/o bil ankle fx with Rt ORIF- stiffness noted  PAIN:  Are you having pain? Yes VAS scale: 5/10 Pain location: Rt lateral, distal thigh Pain orientation: Right  PAIN TYPE: aching Pain description: constant  Aggravating factors: walking about 2 miles creates numbness, stooping sometimes Relieving factors: meds  PRECAUTIONS: None  WEIGHT BEARING RESTRICTIONS No  OCCUPATION: retired  PLOF: Independent  PATIENT GOALS: improve balance, decrease pain   OBJECTIVE:   DIAGNOSTIC FINDINGS: Right hip: No significant right hip OA.  Radiolucencies present overlying pubic symphysis.   L-spine: DDD and facet DJD L4-L5 and L5-S1.  No acute fractures.  PATIENT SURVEYS:  FOTO 81    TODAY'S TREATMENT:  12/7  Manual: STM bilat L/S paraspinals Joint mob: L sided UPA L2-5 grade III   Nustep L3 6 min LTR 3s 10x  Seated QL stretch 30s 2x each Active supine HS stretch R 30s 2x Child's pose 10s 10x Seated flexion stretch  swissball 10s 10x Supine piriformis stretch pushing down 30s 3x tandem foam balance bilat 30s 4x SLS with toe touch cued for level pelvis  12/5    LTR 3s 10x   Seated QL stretch 30s 2x each Active supine HS stretch R 30s 2x Clamshell BTB 2x10 bilat 3s hold Bridge knees swissball squeeze 2x15 Supine piriformis stretch pushing down 30s 3x Sidestepping RTB at knees 64f x2 laps tandem foam balance bilat 30s 4x SLS with toe touch cued for level pelvis  12/1  Active supine HS stretch R 30s 2x Seated abdominal bracing with trunk flexion (as per HEP) 15x Clamshell BTB 2x10 bilat 3s hold Bridge with BTB at knees 2x15 Supine piriformis stretch pushing down 30s  3x Standing stair hip hike 2x10 tandem foam balance bilat 30s 4x    PATIENT EDUCATION:  Education details: anatomy, exercise progression, DOMS expectations, muscle firing,  envelope of function, HEP, POC  Person educated: Patient Education method: Explanation, Demonstration, Tactile cues, Verbal cues, and Handouts Education comprehension: verbalized understanding, returned demonstration, verbal cues required, tactile cues required, and needs further education   HOME EXERCISE PROGRAM: Access Code: [email or web address] URL: [email or web address] Date: 10/13/2021 Prepared by: Daleen Bo  Exercises Seated Hamstring Stretch - 1 x daily - 7 x weekly - 2 sets - 20s hold Supine Figure 4 Piriformis Stretch - 1 x daily - 7 x weekly - 1 sets - 3 reps - 30 hold Single Leg Stance - 1 x daily - 7 x weekly - 1 sets - 4 reps - 20s hold Clamshell with Resistance - 1 x daily - 3-4 x weekly - 3 sets - 10 reps Supine Bridge with Resistance Band - 1 x daily - 3-4 x weekly - 3 sets - 10 reps Supine Lower Trunk Rotation - 1 x daily - 3-4 x weekly - 2 sets - 10 reps - 3 hold Child's Pose Stretch - 1 x daily - 3-4 x weekly - 2 sets - 10 reps - 10 hold     ASSESSMENT:  CLINICAL IMPRESSION: Pt presents today with increased lumbar and  bilateral hp stiffness from previous exercise. Pt's s/s appear consistent with expected DOMS. Session used to provide edu as well as improve mobility and general stiffness. Pt with report of decrease to 0/10 pain at end of session. HEP updated to include lumbar mobility exercise. Plan to continue with balance and strength as able.   Objective impairments include decreased activity tolerance, decreased balance, difficulty walking, decreased strength, increased muscle spasms, impaired flexibility, improper body mechanics, postural dysfunction, and pain. These impairments are limiting patient from cleaning, community activity, meal prep, and shopping. Personal factors including 1-2 comorbidities: acute on chronic hip pain, LBP, h/o bil ankle fx-Rt ORIF, sensation of decreasing balance  are also affecting patient's functional outcome. Patient will benefit from skilled PT to address above impairments and improve overall function.    REHAB POTENTIAL: Good  CLINICAL DECISION MAKING: Evolving/moderate complexity  EVALUATION COMPLEXITY: Moderate   GOALS: Goals reviewed with patient? Yes  SHORT TERM GOALS:  STG Name Target Date Goal status  1 Independent with basic daily stretching/exercise program Baseline: to be established 10/15/21 INITIAL  2 Make decision on need for heel lift to stay in shoe Baseline: added 3-layer lift to Rt shoe at eval 10/15/21 INITIAL                            LONG TERM GOALS:   LTG Name Target Date Goal status  1 Pt will be able to get up and start walking with sensation of controlled balance Baseline: has felt off for a couple of weeks at eval 11/05/21 INITIAL  2 Gross hip strength to 5/5 Baseline:not appropriate to test at eval due to notable innominate rotation 11/05/21 INITIAL  3 Resolution of ITB pain Baseline: acute on chronic issue 11/05/21 INITIAL  4 Ankle DF to at least 8 deg for necessary ROM in stance phase of gait Baseline:3 at eval 11/05/21 INITIAL                   PLAN: PT FREQUENCY: 1-2x/week  PT DURATION: 6 weeks  PLANNED INTERVENTIONS: Therapeutic exercises, Therapeutic activity, Neuro Muscular re-education,  Balance training, Gait training, Patient/Family education, Joint mobilization, Stair training, Aquatic Therapy, Dry Needling, Spinal mobilization, Cryotherapy, Moist heat, Taping, Traction, Ionotophoresis 57m/ml Dexamethasone, and Manual therapy  PLAN FOR NEXT SESSION: Core & hip abd strengthening  ADaleen BoPT, DPT 10/13/21 10:23 AM

## 2021-10-15 ENCOUNTER — Encounter (HOSPITAL_BASED_OUTPATIENT_CLINIC_OR_DEPARTMENT_OTHER): Payer: Medicare Other | Admitting: Physical Therapy

## 2021-10-20 ENCOUNTER — Encounter (HOSPITAL_BASED_OUTPATIENT_CLINIC_OR_DEPARTMENT_OTHER): Payer: Medicare Other | Admitting: Physical Therapy

## 2021-10-22 ENCOUNTER — Encounter (HOSPITAL_BASED_OUTPATIENT_CLINIC_OR_DEPARTMENT_OTHER): Payer: Medicare Other | Admitting: Physical Therapy

## 2021-10-25 ENCOUNTER — Encounter: Payer: Self-pay | Admitting: Internal Medicine

## 2021-10-25 ENCOUNTER — Other Ambulatory Visit: Payer: Self-pay

## 2021-10-25 ENCOUNTER — Ambulatory Visit (INDEPENDENT_AMBULATORY_CARE_PROVIDER_SITE_OTHER): Payer: Medicare Other | Admitting: Internal Medicine

## 2021-10-25 VITALS — BP 110/60 | HR 81 | Temp 98.2°F | Ht 72.5 in | Wt 226.0 lb

## 2021-10-25 DIAGNOSIS — M5416 Radiculopathy, lumbar region: Secondary | ICD-10-CM | POA: Insufficient documentation

## 2021-10-25 DIAGNOSIS — E559 Vitamin D deficiency, unspecified: Secondary | ICD-10-CM

## 2021-10-25 DIAGNOSIS — R7302 Impaired glucose tolerance (oral): Secondary | ICD-10-CM | POA: Diagnosis not present

## 2021-10-25 DIAGNOSIS — M5412 Radiculopathy, cervical region: Secondary | ICD-10-CM

## 2021-10-25 NOTE — Patient Instructions (Signed)
Please continue all other medications as before, and refills have been done if requested.  Please have the pharmacy call with any other refills you may need.  Please continue your efforts at being more active, low cholesterol diet, and weight control.  Please keep your appointments with your specialists as you may have planned  You will be contacted regarding the referral for: MRI for the neck AND the lower back, AND Dr Vertell Limber (or his group)

## 2021-10-25 NOTE — Assessment & Plan Note (Signed)
Lab Results  Component Value Date   HGBA1C 5.8 08/09/2021   Stable, pt to continue current medical treatment  - diet

## 2021-10-25 NOTE — Assessment & Plan Note (Signed)
With recent worsening, new neuro change, for C spine MRI,,  to f/u any worsening symptoms or concerns

## 2021-10-25 NOTE — Assessment & Plan Note (Addendum)
With recent worsening, new neuro change, for LS spine MRI,  to f/u any worsening symptoms or concerns

## 2021-10-25 NOTE — Progress Notes (Signed)
Patient ID: [name], female   DOB: [birth year], 70 y.o.   MRN: [id number]       Chief Complaint: follow up neck and lower back pain       HPI:  [name] is a 70 y.o. female here with c/o 2 mo gradually worsening now severe intermittent post neck pain with right > left pain to neck and upper back but worsening LUE pain nubmness and mild weakness, in particular worsening numbenss to all fingers both hands, similar to that she had prior to c spine surgury over 5 yrs ago.  Also, incidnetly with 4-5 wks gradually worsening lower back pain now constant , mod to severe, with LLE pain, numbness and weakness.  Pt denies chest pain, increased sob or doe, wheezing, orthopnea, PND, increased LE swelling, palpitations, dizziness or syncope.   Pt denies polydipsia, polyuria, or new focal neuro s/s.   Pt denies fever, wt loss, night sweats, loss of appetite, or other constitutional symptoms     Wt Readings from Last 3 Encounters:  10/25/21 226 lb (102.5 kg)  09/27/21 225 lb 6.4 oz (102.2 kg)  08/16/21 229 lb 9.6 oz (104.1 kg)   BP Readings from Last 3 Encounters:  10/25/21 110/60  09/27/21 132/86  08/16/21 (!) 148/98         Past Medical History:  Diagnosis Date   Abnormal Pap smear of vagina 06/17/15   LGSIL   Allergic rhinitis, cause unspecified    Arthritis    ARTHRITIS, KNEE 02/10/2009   Qualifier: Diagnosis of  By: Niel Hummer MD, Willie R    BRCA negative 03/25/13   Cervical cancer ([place]) 1984   Cervical disc disease 02/11/2012   Chronic lower back pain    Degenerative arthritis of hip 02/11/2012   Diverticulosis 02/11/2012   GERD 08/10/2007   Qualifier: Diagnosis of  By: Sherlynn Stalls, CMA, Cindy     GERD (gastroesophageal reflux disease)    GERD (gastroesophageal reflux disease)    Hemorrhoids    HEMORRHOIDS-INTERNAL 07/21/2010   Qualifier: Diagnosis of  By: Chester Holstein NP, Paula     Hiatal hernia    Hyperlipidemia 02/13/2012   Mitral valve prolapse    no cardiologist, mild   OAB (overactive bladder)     Obesity    OVERACTIVE BLADDER 08/10/2007   Qualifier: Diagnosis of  By: Sherlynn Stalls, [place], [place]     Pneumonia 2017   PONV (postoperative nausea and vomiting)    yrs ago unable to raise arms after female surgery, no recent problems   Recurrent UTI    Sleep apnea    uses cpap set on 2   SUI (stress urinary incontinence, female)    Urinary incontinence    VAGINITIS, ATROPHIC 11/11/2008   Qualifier: Diagnosis of  By: Niel Hummer MD, Lorinda Creed    Past Surgical History:  Procedure Laterality Date   bladder tack     x 3 or 4   BUNIONECTOMY Right 2007   CERVICAL DISCECTOMY  2002   fusion and plate C 3   CHOLECYSTECTOMY N/A 08/13/2013   Procedure: LAPAROSCOPIC CHOLECYSTECTOMY WITH INTRAOPERATIVE CHOLANGIOGRAM;  Surgeon: Shann Medal, MD;  Location: [facility];  Service: General;  Laterality: N/A;   CLOSED REDUCTION METATARSAL FRACTURE     rt side 06-03-18, left side 08-19-17   COLONOSCOPY  2018   conization of cervix  1982   with D&C   CYSTOCELE REPAIR  2003   rectocele repair   EYE SURGERY Bilateral yrs ago  lasik   ORIF ANKLE FRACTURE Right 08/20/2017   Procedure: OPEN REDUCTION INTERNAL FIXATION (ORIF) ANKLE FRACTURE;  Surgeon: Meredith Pel, MD;  [facility];  Service: Orthopedics;  Laterality: Right;   SHOULDER ARTHROSCOPY Right 2004   frozen shoulder   TONSILLECTOMY  as child   adenoids removed   TRANSANAL HEMORRHOIDAL DEARTERIALIZATION N/A 07/30/2020   Procedure: TRANSANAL HEMORRHOIDAL DEARTERIALIZATION;  Surgeon: Leighton Ruff, MD;  Location: [facility];  Service: General;  Laterality: N/A;   [place]   partial    reports that she has never smoked. She has never used smokeless tobacco. She reports current alcohol use. She reports that she does not use drugs. family history includes Breast cancer in her cousin, mother, and sister; Cancer in her maternal grandmother; Diabetes in her brother, maternal aunt, and maternal uncle; Heart disease  in her brother and sister; Thyroid cancer in an other family member. Allergies  Allergen Reactions   Amoxicillin     REACTION: unspecified   Penicillins Other (See Comments)    Unknown reaction, mother had reaction almost died   Current Outpatient Medications on File Prior to Visit  Medication Sig Dispense Refill   cholecalciferol (VITAMIN D3) 25 MCG (1000 UNIT) tablet Take 2,000 Units by mouth daily. 50 mcg daily     estradiol (ESTRACE VAGINAL) 0.1 MG/GM vaginal cream 1 gram pv twice weekly 42.5 g 3   MULTIPLE VITAMIN tablet Take 1 tablet by mouth daily.     nabumetone (RELAFEN) 750 MG tablet TAKE 1 TABLET BY MOUTH TWICE A DAY AS NEEDED FOR ARTHRITIS 180 tablet 2   pantoprazole (PROTONIX) 40 MG tablet Take 1 tablet (40 mg total) by mouth daily. 90 tablet 3   No current facility-administered medications on file prior to visit.        ROS:  All others reviewed and negative.  Objective        PE:  BP 110/60 (BP Location: Right Arm, Patient Position: Sitting, Cuff Size: Large)    Pulse 81    Temp 98.2 F (36.8 C) (Oral)    Ht 6' 0.5" (1.842 m)    Wt 226 lb (102.5 kg)    SpO2 (!) 81%    BMI 30.23 kg/m                 Constitutional: Pt appears in NAD               HENT: Head: NCAT.                Right Ear: External ear normal.                 Left Ear: External ear normal.                Eyes: . Pupils are equal, round, and reactive to light. Conjunctivae and EOM are normal               Nose: without d/c or deformity               Neck: Neck supple. Gross normal ROM               Cardiovascular: Normal rate and regular rhythm.                 Pulmonary/Chest: Effort normal and breath sounds without rales or wheezing.                Abd:  Soft, NT,  ND, + BS, no organomegaly               Neurological: Pt is alert. At baseline orientation, motor with LLE 3+/5 and LUE 4/5               Skin: Skin is warm. No rashes, no other new lesions, LE edema - none               Psychiatric: Pt  behavior is normal without agitation   Micro: none  Cardiac tracings I have personally interpreted today:  none  Pertinent Radiological findings (summarize): none   Lab Results  Component Value Date   WBC 8.3 08/09/2021   HGB 14.1 08/09/2021   HCT 42.2 08/09/2021   PLT 262.0 08/09/2021   GLUCOSE 91 08/09/2021   CHOL 189 08/09/2021   TRIG 113.0 08/09/2021   HDL 46.50 08/09/2021   LDLDIRECT 135.1 05/08/2012   LDLCALC 120 (H) 08/09/2021   ALT 23 08/09/2021   AST 25 08/09/2021   NA 141 08/09/2021   K 3.6 08/09/2021   CL 105 08/09/2021   CREATININE 0.95 08/09/2021   BUN 17 08/09/2021   CO2 26 08/09/2021   TSH 2.72 08/09/2021   HGBA1C 5.8 08/09/2021   Assessment/Plan:  [name] is a 70 y.o. White or Caucasian [1] female with  has a past medical history of Abnormal Pap smear of vagina (06/17/15), Allergic rhinitis, cause unspecified, Arthritis, ARTHRITIS, KNEE (02/10/2009), BRCA negative (03/25/13), Cervical cancer ([place]) (1984), Cervical disc disease (02/11/2012), Chronic lower back pain, Degenerative arthritis of hip (02/11/2012), Diverticulosis (02/11/2012), GERD (08/10/2007), GERD (gastroesophageal reflux disease), GERD (gastroesophageal reflux disease), Hemorrhoids, HEMORRHOIDS-INTERNAL (07/21/2010), Hiatal hernia, Hyperlipidemia (02/13/2012), Mitral valve prolapse, OAB (overactive bladder), Obesity, OVERACTIVE BLADDER (08/10/2007), Pneumonia (2017), PONV (postoperative nausea and vomiting), Recurrent UTI, Sleep apnea, SUI (stress urinary incontinence, female), Urinary incontinence, and VAGINITIS, ATROPHIC (11/11/2008).  Left cervical radiculopathy With recent worsening, new neuro change, for C spine MRI,,  to f/u any worsening symptoms or concerns  Left lumbar radiculopathy With recent worsening, new neuro change, for LS spine MRI,  to f/u any worsening symptoms or concerns  Impaired glucose tolerance Lab Results  Component Value Date   HGBA1C 5.8 08/09/2021   Stable, pt to continue  current medical treatment  - diet   Vitamin D deficiency Last vitamin D Lab Results  Component Value Date   VD25OH 50.43 08/09/2021   Stable, cont oral replacement  Followup: Return in 6 months (on 04/25/2022), or if symptoms worsen or fail to improve.  Cathlean Cower, MD 10/25/2021 10:20 PM [facility]

## 2021-10-25 NOTE — Assessment & Plan Note (Signed)
Last vitamin D Lab Results  Component Value Date   VD25OH 50.43 08/09/2021   Stable, cont oral replacement  

## 2021-10-26 ENCOUNTER — Encounter (HOSPITAL_BASED_OUTPATIENT_CLINIC_OR_DEPARTMENT_OTHER): Payer: Medicare Other | Admitting: Physical Therapy

## 2021-10-28 ENCOUNTER — Encounter (HOSPITAL_BASED_OUTPATIENT_CLINIC_OR_DEPARTMENT_OTHER): Payer: Self-pay | Admitting: Physical Therapy

## 2021-10-28 ENCOUNTER — Other Ambulatory Visit: Payer: Self-pay

## 2021-10-28 ENCOUNTER — Ambulatory Visit (HOSPITAL_BASED_OUTPATIENT_CLINIC_OR_DEPARTMENT_OTHER): Payer: Medicare Other | Admitting: Physical Therapy

## 2021-10-28 DIAGNOSIS — R262 Difficulty in walking, not elsewhere classified: Secondary | ICD-10-CM | POA: Diagnosis not present

## 2021-10-28 DIAGNOSIS — M79604 Pain in right leg: Secondary | ICD-10-CM | POA: Diagnosis not present

## 2021-10-28 DIAGNOSIS — M6281 Muscle weakness (generalized): Secondary | ICD-10-CM

## 2021-10-28 NOTE — Therapy (Signed)
OUTPATIENT PHYSICAL TREATMENT   Patient Name: [name] MRN: [id number] DOB:19-Nov-1950, 70 y.o., female Today's Date: 10/28/2021   PT End of Session - 10/28/21 1125     Visit Number 6    Number of Visits 13    Date for PT Re-Evaluation 11/05/21    Authorization Type MCR- FOTO visit 6    Progress Note Due on Visit 10    PT Start Time 1100    PT Stop Time 1140    PT Time Calculation (min) 40 min    Activity Tolerance Patient tolerated treatment well    Behavior During Therapy [facility] for tasks assessed/performed                  Past Medical History:  Diagnosis Date   Abnormal Pap smear of vagina 06/17/15   LGSIL   Allergic rhinitis, cause unspecified    Arthritis    ARTHRITIS, KNEE 02/10/2009   Qualifier: Diagnosis of  By: Niel Hummer MD, Willie R    BRCA negative 03/25/13   Cervical cancer ([place]) 1984   Cervical disc disease 02/11/2012   Chronic lower back pain    Degenerative arthritis of hip 02/11/2012   Diverticulosis 02/11/2012   GERD 08/10/2007   Qualifier: Diagnosis of  By: Sherlynn Stalls, CMA, Cindy     GERD (gastroesophageal reflux disease)    GERD (gastroesophageal reflux disease)    Hemorrhoids    HEMORRHOIDS-INTERNAL 07/21/2010   Qualifier: Diagnosis of  By: Chester Holstein NP, Paula     Hiatal hernia    Hyperlipidemia 02/13/2012   Mitral valve prolapse    no cardiologist, mild   OAB (overactive bladder)    Obesity    OVERACTIVE BLADDER 08/10/2007   Qualifier: Diagnosis of  By: Sherlynn Stalls, [place], [place]     Pneumonia 2017   PONV (postoperative nausea and vomiting)    yrs ago unable to raise arms after female surgery, no recent problems   Recurrent UTI    Sleep apnea    uses cpap set on 2   SUI (stress urinary incontinence, female)    Urinary incontinence    VAGINITIS, ATROPHIC 11/11/2008   Qualifier: Diagnosis of  By: Niel Hummer MD, Lorinda Creed    Past Surgical History:  Procedure Laterality Date   bladder tack     x 3 or 4   BUNIONECTOMY Right 2007   CERVICAL DISCECTOMY   2002   fusion and plate C 3   CHOLECYSTECTOMY N/A 08/13/2013   Procedure: LAPAROSCOPIC CHOLECYSTECTOMY WITH INTRAOPERATIVE CHOLANGIOGRAM;  Surgeon: Shann Medal, MD;  Location: [facility];  Service: General;  Laterality: N/A;   CLOSED REDUCTION METATARSAL FRACTURE     rt side 06-03-18, left side 08-19-17   COLONOSCOPY  2018   conization of cervix  1982   with D&C   CYSTOCELE REPAIR  2003   rectocele repair   EYE SURGERY Bilateral yrs ago   lasik   ORIF ANKLE FRACTURE Right 08/20/2017   Procedure: OPEN REDUCTION INTERNAL FIXATION (ORIF) ANKLE FRACTURE;  Surgeon: Meredith Pel, MD;  [facility];  Service: Orthopedics;  Laterality: Right;   SHOULDER ARTHROSCOPY Right 2004   frozen shoulder   TONSILLECTOMY  as child   adenoids removed   TRANSANAL HEMORRHOIDAL DEARTERIALIZATION N/A 07/30/2020   Procedure: TRANSANAL HEMORRHOIDAL DEARTERIALIZATION;  Surgeon: Leighton Ruff, MD;  Location: [facility];  Service: General;  Laterality: N/A;   [place]   partial   Patient Active Problem List  Diagnosis Date Noted   Left lumbar radiculopathy 10/25/2021   Trochanteric bursitis of right hip 08/09/2021   Vitamin D deficiency 08/09/2020   Paresthesia of foot, bilateral 08/09/2020   Neuritis of right ulnar nerve 08/09/2020   Aortic atherosclerosis ([place]) 08/06/2020   Left cervical radiculopathy 10/07/2019   Bilateral low back pain with bilateral sciatica 10/07/2019   Acute upper respiratory infection 01/17/2019   Acute conjunctivitis of left eye 01/17/2019   Urinary frequency 01/09/2019   Dysuria 03/28/2018   Trimalleolar fracture 08/20/2017   Ankle fracture 08/20/2017   OSA on CPAP 07/30/2017   Right hip pain 07/28/2017   Abnormal TSH 07/21/2015   Greater trochanteric bursitis of right hip 08/04/2014   Localized osteoarthrosis, lower leg 45/40/9811   Biliary colic 91/47/8295   Cholelithiasis 08/07/2013   Hyperlipidemia 02/13/2012   Impaired  glucose tolerance 02/13/2012   Preventative health care 02/11/2012   Diverticulosis 02/11/2012   Degenerative arthritis of hip 02/11/2012   Cervical disc disease 02/11/2012   Allergic rhinitis    Chronic low back pain    Obesity    Blood in stool 08/31/2010   HEMORRHOIDS-INTERNAL 07/21/2010   CONSTIPATION 07/19/2010   ARTHRITIS, KNEE 02/10/2009   VAGINITIS, ATROPHIC 11/11/2008   EXOGENOUS OBESITY 07/29/2008   GERD 08/10/2007   OVERACTIVE BLADDER 08/10/2007    PCP: Biagio Borg, MD  REFERRING PROVIDER: Biagio Borg, MD  REFERRING DIAG: [phone] (ICD-10-CM) - Chronic right hip pain   THERAPY DIAG:  Pain in right leg  Muscle weakness (generalized)  Difficulty in walking, not elsewhere classified  ONSET DATE: about 6 mo ago recently but also about 4 yr ago  SUBJECTIVE:   SUBJECTIVE STATEMENT: Pt states she is a lot of pain today from travelling and sitting too long. She has been sitting too long in a small sedan. Pain is across the low back. She saw MD on Monday and has MRI ordered for back and neck.    PERTINENT HISTORY: Procedure: Real-time Ultrasound Guided Injection of right hip greater trochanter bursa on 08/16/2021  H/o bil ankle fx with Rt ORIF- stiffness noted  PAIN:  Are you having pain? Yes VAS scale: 9/10 Pain location: low back, across belt line Pain orientation: Right  PAIN TYPE: aching Pain description: constant  Aggravating factors: walking about 2 miles creates numbness, stooping sometimes Relieving factors: meds  PRECAUTIONS: None  WEIGHT BEARING RESTRICTIONS No  OCCUPATION: retired  PLOF: Independent  PATIENT GOALS: improve balance, decrease pain   OBJECTIVE:   DIAGNOSTIC FINDINGS: Right hip: No significant right hip OA.  Radiolucencies present overlying pubic symphysis.   L-spine: DDD and facet DJD L4-L5 and L5-S1.  No acute fractures.  PATIENT SURVEYS:  FOTO 81    TODAY'S TREATMENT:  12/22  Manual: STM bilat L/S  paraspinals Joint mob: L sided UPA L2-5 grade III  LTR 3s 10x  PPT 2s 20x Child's pose 10s 10x Seated pelvic tilting 15x Bridge 2x10 Seated QL stretch 20s 3x each   12/7  Manual: STM bilat L/S paraspinals Joint mob: L sided UPA L2-5 grade III   Nustep L3 6 min LTR 3s 10x  Seated QL stretch 30s 2x each Active supine HS stretch R 30s 2x Child's pose 10s 10x Seated flexion stretch swissball 10s 10x Supine piriformis stretch pushing down 30s 3x tandem foam balance bilat 30s 4x SLS with toe touch cued for level pelvis  12/5    LTR 3s 10x   Seated QL stretch 30s 2x each Active supine HS  stretch R 30s 2x Clamshell BTB 2x10 bilat 3s hold Bridge knees swissball squeeze 2x15 Supine piriformis stretch pushing down 30s 3x Sidestepping RTB at knees 66f x2 laps tandem foam balance bilat 30s 4x SLS with toe touch cued for level pelvis  12/1  Active supine HS stretch R 30s 2x Seated abdominal bracing with trunk flexion (as per HEP) 15x Clamshell BTB 2x10 bilat 3s hold Bridge with BTB at knees 2x15 Supine piriformis stretch pushing down 30s 3x Standing stair hip hike 2x10 tandem foam balance bilat 30s 4x    PATIENT EDUCATION:  Education details: anatomy, exercise progression, DOMS expectations, muscle firing,  envelope of function, HEP, POC  Person educated: Patient Education method: Explanation, Demonstration, Tactile cues, Verbal cues, and Handouts Education comprehension: verbalized understanding, returned demonstration, verbal cues required, tactile cues required, and needs further education   HOME EXERCISE PROGRAM: Access Code: [email or web address]: [email or web address] Date: 10/13/2021 Prepared by: ADaleen Bo Exercises Seated Hamstring Stretch - 1 x daily - 7 x weekly - 2 sets - 20s hold Supine Figure 4 Piriformis Stretch - 1 x daily - 7 x weekly - 1 sets - 3 reps - 30 hold Single Leg Stance - 1 x daily - 7 x weekly - 1 sets - 4 reps - 20s  hold Clamshell with Resistance - 1 x daily - 3-4 x weekly - 3 sets - 10 reps Supine Bridge with Resistance Band - 1 x daily - 3-4 x weekly - 3 sets - 10 reps Supine Lower Trunk Rotation - 1 x daily - 3-4 x weekly - 2 sets - 10 reps - 3 hold Child's Pose Stretch - 1 x daily - 3-4 x weekly - 2 sets - 10 reps - 10 hold     ASSESSMENT:  CLINICAL IMPRESSION: Pt presents today with significantly elevated pain due to recent static positioning during extended travel. Session used to manage pain and improve lumbar mobility. Pt with report of 0/10 pain at end of session and able to improve gait mechanics as well as lumbar ROM. Plan to continue with previous strength and exercise as able. Pt instructed to continue with gentle isometrics and lumbar stretching at home. Plan to take FOTO at next session for more accurate representation of self perceived progress.  Objective impairments include decreased activity tolerance, decreased balance, difficulty walking, decreased strength, increased muscle spasms, impaired flexibility, improper body mechanics, postural dysfunction, and pain. These impairments are limiting patient from cleaning, community activity, meal prep, and shopping. Personal factors including 1-2 comorbidities: acute on chronic hip pain, LBP, h/o bil ankle fx-Rt ORIF, sensation of decreasing balance  are also affecting patient's functional outcome. Patient will benefit from skilled PT to address above impairments and improve overall function.    REHAB POTENTIAL: Good  CLINICAL DECISION MAKING: Evolving/moderate complexity  EVALUATION COMPLEXITY: Moderate   GOALS: Goals reviewed with patient? Yes  SHORT TERM GOALS:  STG Name Target Date Goal status  1 Independent with basic daily stretching/exercise program Baseline: to be established 10/15/21 INITIAL  2 Make decision on need for heel lift to stay in shoe Baseline: added 3-layer lift to Rt shoe at eval 10/15/21 INITIAL                             LONG TERM GOALS:   LTG Name Target Date Goal status  1 Pt will be able to get up and start walking with sensation of controlled balance Baseline: has felt  off for a couple of weeks at eval 11/05/21 INITIAL  2 Gross hip strength to 5/5 Baseline:not appropriate to test at eval due to notable innominate rotation 11/05/21 INITIAL  3 Resolution of ITB pain Baseline: acute on chronic issue 11/05/21 INITIAL  4 Ankle DF to at least 8 deg for necessary ROM in stance phase of gait Baseline:3 at eval 11/05/21 INITIAL                  PLAN: PT FREQUENCY: 1-2x/week  PT DURATION: 6 weeks  PLANNED INTERVENTIONS: Therapeutic exercises, Therapeutic activity, Neuro Muscular re-education, Balance training, Gait training, Patient/Family education, Joint mobilization, Stair training, Aquatic Therapy, Dry Needling, Spinal mobilization, Cryotherapy, Moist heat, Taping, Traction, Ionotophoresis 6m/ml Dexamethasone, and Manual therapy  PLAN FOR NEXT SESSION: Core & hip abd strengthening  ADaleen BoPT, DPT 10/28/21 1:34 PM

## 2021-11-03 ENCOUNTER — Encounter (HOSPITAL_BASED_OUTPATIENT_CLINIC_OR_DEPARTMENT_OTHER): Payer: Medicare Other | Admitting: Physical Therapy

## 2021-11-04 ENCOUNTER — Telehealth: Payer: Self-pay

## 2021-11-04 NOTE — Telephone Encounter (Signed)
Pam from Dr. Donald Pore office called and requested that the Referral that was sent on 12/19 be sent again due to it not loading on her end in [place].

## 2021-11-04 NOTE — Telephone Encounter (Signed)
To forward to [facility] please - thanks

## 2021-11-05 ENCOUNTER — Encounter (HOSPITAL_BASED_OUTPATIENT_CLINIC_OR_DEPARTMENT_OTHER): Payer: Medicare Other | Admitting: Physical Therapy

## 2021-11-09 ENCOUNTER — Encounter (HOSPITAL_BASED_OUTPATIENT_CLINIC_OR_DEPARTMENT_OTHER): Payer: Medicare Other | Admitting: Physical Therapy

## 2021-11-10 ENCOUNTER — Ambulatory Visit: Payer: Medicare Other | Admitting: Family Medicine

## 2021-11-12 ENCOUNTER — Encounter (HOSPITAL_BASED_OUTPATIENT_CLINIC_OR_DEPARTMENT_OTHER): Payer: Medicare Other | Admitting: Physical Therapy

## 2021-11-16 ENCOUNTER — Ambulatory Visit
Admission: RE | Admit: 2021-11-16 | Discharge: 2021-11-16 | Disposition: A | Discharge status: Home / Self Care | Payer: Medicare Other | Source: Ambulatory Visit | Attending: Internal Medicine | Admitting: Internal Medicine

## 2021-11-16 ENCOUNTER — Other Ambulatory Visit: Payer: Self-pay

## 2021-11-16 DIAGNOSIS — M545 Low back pain, unspecified: Secondary | ICD-10-CM | POA: Diagnosis not present

## 2021-11-16 DIAGNOSIS — M4802 Spinal stenosis, cervical region: Secondary | ICD-10-CM | POA: Diagnosis not present

## 2021-11-16 DIAGNOSIS — M5416 Radiculopathy, lumbar region: Secondary | ICD-10-CM

## 2021-11-16 DIAGNOSIS — M47812 Spondylosis without myelopathy or radiculopathy, cervical region: Secondary | ICD-10-CM | POA: Diagnosis not present

## 2021-11-16 DIAGNOSIS — M2578 Osteophyte, vertebrae: Secondary | ICD-10-CM | POA: Diagnosis not present

## 2021-11-16 DIAGNOSIS — M5412 Radiculopathy, cervical region: Secondary | ICD-10-CM

## 2021-11-17 ENCOUNTER — Encounter: Payer: Self-pay | Admitting: Internal Medicine

## 2021-11-17 DIAGNOSIS — Z6835 Body mass index (BMI) 35.0-35.9, adult: Secondary | ICD-10-CM | POA: Diagnosis not present

## 2021-11-17 DIAGNOSIS — M47816 Spondylosis without myelopathy or radiculopathy, lumbar region: Secondary | ICD-10-CM | POA: Diagnosis not present

## 2021-11-17 DIAGNOSIS — M47812 Spondylosis without myelopathy or radiculopathy, cervical region: Secondary | ICD-10-CM | POA: Diagnosis not present

## 2021-11-18 ENCOUNTER — Other Ambulatory Visit: Payer: Self-pay | Admitting: Neurological Surgery

## 2021-11-18 DIAGNOSIS — M47816 Spondylosis without myelopathy or radiculopathy, lumbar region: Secondary | ICD-10-CM

## 2021-11-23 ENCOUNTER — Other Ambulatory Visit: Payer: Medicare Other

## 2021-11-24 ENCOUNTER — Ambulatory Visit
Admission: RE | Admit: 2021-11-24 | Discharge: 2021-11-24 | Disposition: A | Discharge status: Home / Self Care | Payer: Medicare Other | Source: Ambulatory Visit | Attending: Neurological Surgery | Admitting: Neurological Surgery

## 2021-11-24 ENCOUNTER — Other Ambulatory Visit: Payer: Self-pay

## 2021-11-24 DIAGNOSIS — M47816 Spondylosis without myelopathy or radiculopathy, lumbar region: Secondary | ICD-10-CM

## 2021-11-24 DIAGNOSIS — M47817 Spondylosis without myelopathy or radiculopathy, lumbosacral region: Secondary | ICD-10-CM | POA: Diagnosis not present

## 2021-11-24 DIAGNOSIS — M5136 Other intervertebral disc degeneration, lumbar region: Secondary | ICD-10-CM | POA: Diagnosis not present

## 2021-11-24 MED ORDER — METHYLPREDNISOLONE ACETATE 40 MG/ML INJ SUSP (RADIOLOG
80.0000 mg | Freq: Once | INTRAMUSCULAR | Status: AC
  Administered 2021-11-24: 80 mg via EPIDURAL

## 2021-11-24 MED ORDER — IOPAMIDOL (ISOVUE-M 200) INJECTION 41%
1.0000 mL | Freq: Once | INTRAMUSCULAR | Status: AC
  Administered 2021-11-24: 1 mL via EPIDURAL

## 2021-11-24 NOTE — Discharge Instructions (Signed)

## 2021-12-29 ENCOUNTER — Telehealth: Payer: Self-pay | Admitting: Internal Medicine

## 2021-12-29 DIAGNOSIS — K921 Melena: Secondary | ICD-10-CM

## 2021-12-29 NOTE — Telephone Encounter (Signed)
[place] for referral to Dr Danis/GI  Please let pt know this does not mean she automatically will need a colonoscopy, but should speak with GI first

## 2021-12-29 NOTE — Telephone Encounter (Signed)
Pt states she had hemorrhoid surgery in 2021, pt states she has started periodically bleeding from her rectum x38m  Pt requesting a referral to gastro for a colonoscopy, pt requesting a c/b

## 2021-12-29 NOTE — Telephone Encounter (Signed)
Called patient to inform her of her referral to [facility]. Patient verbalize understanding. NO further questions

## 2022-01-11 DIAGNOSIS — H5203 Hypermetropia, bilateral: Secondary | ICD-10-CM | POA: Diagnosis not present

## 2022-01-11 DIAGNOSIS — H524 Presbyopia: Secondary | ICD-10-CM | POA: Diagnosis not present

## 2022-01-11 DIAGNOSIS — H52223 Regular astigmatism, bilateral: Secondary | ICD-10-CM | POA: Diagnosis not present

## 2022-01-11 DIAGNOSIS — H25813 Combined forms of age-related cataract, bilateral: Secondary | ICD-10-CM | POA: Diagnosis not present

## 2022-01-11 DIAGNOSIS — H43813 Vitreous degeneration, bilateral: Secondary | ICD-10-CM | POA: Diagnosis not present

## 2022-01-11 DIAGNOSIS — H43393 Other vitreous opacities, bilateral: Secondary | ICD-10-CM | POA: Diagnosis not present

## 2022-02-24 DIAGNOSIS — Z20822 Contact with and (suspected) exposure to covid-19: Secondary | ICD-10-CM | POA: Diagnosis not present

## 2022-03-10 DIAGNOSIS — Z20822 Contact with and (suspected) exposure to covid-19: Secondary | ICD-10-CM | POA: Diagnosis not present

## 2022-03-25 ENCOUNTER — Encounter: Payer: Self-pay | Admitting: Gastroenterology

## 2022-04-19 ENCOUNTER — Encounter: Payer: Self-pay | Admitting: Gastroenterology

## 2022-04-19 ENCOUNTER — Ambulatory Visit (INDEPENDENT_AMBULATORY_CARE_PROVIDER_SITE_OTHER): Payer: Medicare Other | Admitting: Gastroenterology

## 2022-04-19 VITALS — BP 132/76 | HR 86 | Resp 16 | Ht 67.0 in | Wt 224.6 lb

## 2022-04-19 DIAGNOSIS — K648 Other hemorrhoids: Secondary | ICD-10-CM

## 2022-04-19 NOTE — Patient Instructions (Signed)
If you are age 69 or older, your body mass index should be between 23-30. Your Body mass index is 35.18 kg/m. If this is out of the aforementioned range listed, please consider follow up with your Primary Care Provider.  If you are age 37 or younger, your body mass index should be between 19-25. Your Body mass index is 35.18 kg/m. If this is out of the aformentioned range listed, please consider follow up with your Primary Care Provider.   ________________________________________________________  The [facility] providers would like to encourage you to use [facility] to communicate with providers for non-urgent requests or questions.  Due to long hold times on the telephone, sending your provider a message by [facility] may be a faster and more efficient way to get a response.  Please allow 48 business hours for a response.  Please remember that this is for non-urgent requests.  _______________________________________________________  Please follow up with Dr A. Marcello Moores as [facility]  [address], [place].  It was a pleasure to see you today!  Thank you for trusting me with your gastrointestinal care!

## 2022-04-19 NOTE — Progress Notes (Signed)
[facility] Consult Note:  History: [name] 04/19/2022  Referring provider: Biagio Borg, MD  Reason for consult/chief complaint: Rectal Bleeding (Pt states bleeding comes and goes last episode of rectal bleeding was February 21st )   Subjective  HPI: [name] was referred to see [place] for rectal bleeding.  She had previously seen Dr. Lorenza Chick, and then I saw her in 2018 for colonoscopy to investigate this bleeding.  She had grade 3 internal hemorrhoids for which I recommended colorectal surgery. She underwent surgical arterial ligation of the hemorrhoidal plexus by Dr. Marcello Moores in September 2021.  Several months ago she began having episodic painless rectal bleeding, and she showed me a photograph of the photograph from an episode. She denies chronic abdominal pain, and her bowels generally move well as long as she drinks enough water.   ROS:  Review of Systems Denies chest pain dyspnea or dysuria. Chronic arthralgias  Past Medical History: Past Medical History:  Diagnosis Date   Abnormal Pap smear of vagina 06/17/15   LGSIL   Allergic rhinitis, cause unspecified    Arthritis    ARTHRITIS, KNEE 02/10/2009   Qualifier: Diagnosis of  By: Niel Hummer MD, Willie R    BRCA negative 03/25/13   Cervical cancer ([place]) 1984   Cervical disc disease 02/11/2012   Chronic lower back pain    Degenerative arthritis of hip 02/11/2012   Diverticulosis 02/11/2012   GERD 08/10/2007   Qualifier: Diagnosis of  By: Sherlynn Stalls, CMA, Cindy     GERD (gastroesophageal reflux disease)    GERD (gastroesophageal reflux disease)    Hemorrhoids    HEMORRHOIDS-INTERNAL 07/21/2010   Qualifier: Diagnosis of  By: Chester Holstein NP, Paula     Hiatal hernia    Hyperlipidemia 02/13/2012   Mitral valve prolapse    no cardiologist, mild   OAB (overactive bladder)    Obesity    OVERACTIVE BLADDER 08/10/2007   Qualifier: Diagnosis of  By: Sherlynn Stalls, [place], [place]     Pneumonia 2017   PONV (postoperative nausea  and vomiting)    yrs ago unable to raise arms after female surgery, no recent problems   Recurrent UTI    Sleep apnea    uses cpap set on 2   SUI (stress urinary incontinence, female)    Urinary incontinence    VAGINITIS, ATROPHIC 11/11/2008   Qualifier: Diagnosis of  By: Niel Hummer MD, Lorinda Creed      Past Surgical History: Past Surgical History:  Procedure Laterality Date   bladder tack     x 3 or 4   BUNIONECTOMY Right 2007   CERVICAL DISCECTOMY  2002   fusion and plate C 3   CHOLECYSTECTOMY N/A 08/13/2013   Procedure: LAPAROSCOPIC CHOLECYSTECTOMY WITH INTRAOPERATIVE CHOLANGIOGRAM;  Surgeon: Shann Medal, MD;  Location: [facility];  Service: General;  Laterality: N/A;   CLOSED REDUCTION METATARSAL FRACTURE     rt side 06-03-18, left side 08-19-17   COLONOSCOPY  2018   conization of cervix  1982   with D&C   CYSTOCELE REPAIR  2003   rectocele repair   EYE SURGERY Bilateral yrs ago   lasik   ORIF ANKLE FRACTURE Right 08/20/2017   Procedure: OPEN REDUCTION INTERNAL FIXATION (ORIF) ANKLE FRACTURE;  Surgeon: Meredith Pel, MD;  Location: [facility];  Service: Orthopedics;  Laterality: Right;   SHOULDER ARTHROSCOPY Right 2004   frozen shoulder   TONSILLECTOMY  as child   adenoids removed   TRANSANAL HEMORRHOIDAL DEARTERIALIZATION  N/A 07/30/2020   Procedure: TRANSANAL HEMORRHOIDAL DEARTERIALIZATION;  Surgeon: Leighton Ruff, MD;  Location: [facility];  Service: General;  Laterality: N/A;   VAGINAL HYSTERECTOMY  1986   partial     Family History: Family History  Problem Relation Age of Onset   Breast cancer Mother        dx in her last 8's   Heart disease Sister    Breast cancer Sister        dx in her late 33's   Diabetes Brother    Heart disease Brother        triple bypass   Diabetes Maternal Aunt    Diabetes Maternal Uncle    Cancer Maternal Grandmother        uterine   Breast cancer Cousin    Thyroid cancer Other        2 cousins   Colon cancer  Neg Hx    Esophageal cancer Neg Hx    Liver cancer Neg Hx    Pancreatic cancer Neg Hx    Rectal cancer Neg Hx    Stomach cancer Neg Hx     Social History: Social History   Socioeconomic History   Marital status: Married    Spouse name: Not on file   Number of children: 1   Years of education: Not on file   Highest education level: Not on file  Occupational History    Employer: [employer]  Tobacco Use   Smoking status: Never   Smokeless tobacco: Never  Vaping Use   Vaping Use: Never used  Substance and Sexual Activity   Alcohol use: Yes    Alcohol/week: 0.0 standard drinks of alcohol    Comment: occ wine   Drug use: No   Sexual activity: Yes    Partners: Male    Birth control/protection: Post-menopausal    Comment: hysterectomy  Other Topics Concern   Not on file  Social History Narrative   Not on file   Social Determinants of Health   Financial Resource Strain: Not on file  Food Insecurity: Not on file  Transportation Needs: Not on file  Physical Activity: Not on file  Stress: Not on file  Social Connections: Not on file    Allergies: Allergies  Allergen Reactions   Amoxicillin     REACTION: unspecified   Penicillins Other (See Comments)    Unknown reaction, mother had reaction almost died    Outpatient Meds: Current Outpatient Medications  Medication Sig Dispense Refill   Cholecalciferol (VITAMIN D) 125 MCG (5000 UT) CAPS Take 125 mcg by mouth daily.     MULTIPLE VITAMIN tablet Take 1 tablet by mouth daily.     nabumetone (RELAFEN) 750 MG tablet TAKE 1 TABLET BY MOUTH TWICE A DAY AS NEEDED FOR ARTHRITIS 180 tablet 2   pantoprazole (PROTONIX) 40 MG tablet Take 1 tablet (40 mg total) by mouth daily. 90 tablet 3   vitamin B-12 (CYANOCOBALAMIN) 1000 MCG tablet Take 5,000 mcg by mouth daily.     No current facility-administered medications for this visit.      ___________________________________________________________________ Objective    Exam:  BP 132/76 (BP Location: Left Arm, Patient Position: Sitting, Cuff Size: Normal)   Pulse 86   Resp 16   Ht '5\' 7"'  (1.702 m)   Wt 224 lb 9.6 oz (101.9 kg)   SpO2 96%   BMI 35.18 kg/m  Wt Readings from Last 3 Encounters:  04/19/22 224 lb 9.6 oz (101.9 kg)  10/25/21 226 lb (102.5 kg)  09/27/21 225 lb 6.4 oz (102.2 kg)    General: well-appearing GI: soft, no tenderness, with active bowel sounds. No guarding or palpable organomegaly noted. Skin; warm and dry, no rash or jaundice noted Perianal/rectal exam reveals grade 3 internal hemorrhoids, most notably RP column Soft stool, no palpable internal lesions. Assessment: Encounter Diagnosis  Name Primary?   Bleeding internal hemorrhoids Yes    Recurrence of internal hemorrhoidal bleeding after previous surgical procedure. No straining for bowel movements.  In fact, she typically feels that the bleeding episodes might occur when she has a loose stool. Plan:  Refer to Dr. Leighton Ruff at [place]  Thank you for the courtesy of this consult.  Please call me with any questions or concerns.  Nelida Meuse III  CC: Referring provider noted above

## 2022-05-23 DIAGNOSIS — K642 Third degree hemorrhoids: Secondary | ICD-10-CM | POA: Diagnosis not present

## 2022-06-09 ENCOUNTER — Other Ambulatory Visit (HOSPITAL_BASED_OUTPATIENT_CLINIC_OR_DEPARTMENT_OTHER): Payer: Self-pay

## 2022-07-26 DIAGNOSIS — Z1231 Encounter for screening mammogram for malignant neoplasm of breast: Secondary | ICD-10-CM | POA: Diagnosis not present

## 2022-07-26 LAB — HM MAMMOGRAPHY

## 2022-08-01 ENCOUNTER — Ambulatory Visit (INDEPENDENT_AMBULATORY_CARE_PROVIDER_SITE_OTHER): Payer: Medicare Other | Admitting: Internal Medicine

## 2022-08-01 VITALS — BP 130/78 | HR 71 | Temp 97.9°F

## 2022-08-01 DIAGNOSIS — E559 Vitamin D deficiency, unspecified: Secondary | ICD-10-CM | POA: Diagnosis not present

## 2022-08-01 DIAGNOSIS — M5412 Radiculopathy, cervical region: Secondary | ICD-10-CM

## 2022-08-01 DIAGNOSIS — E7800 Pure hypercholesterolemia, unspecified: Secondary | ICD-10-CM | POA: Diagnosis not present

## 2022-08-01 DIAGNOSIS — R7302 Impaired glucose tolerance (oral): Secondary | ICD-10-CM

## 2022-08-01 MED ORDER — GABAPENTIN 100 MG PO CAPS: 100.0000 mg | ORAL_CAPSULE | Freq: Three times a day (TID) | ORAL | 3 refills | Status: DC

## 2022-08-01 MED ORDER — OXYCODONE HCL ER 10 MG PO T12A: 10.0000 mg | EXTENDED_RELEASE_TABLET | Freq: Two times a day (BID) | ORAL | 0 refills | Status: DC

## 2022-08-01 NOTE — Patient Instructions (Signed)
Please take all new medication as prescribed - the oxycontin 10 mg twice per day as needed, and the gabapentin 100 mg three times per day  Please call in 1 week if oxycontin refill is needed  Please try gabapentin 200 mg dosing in 1 wk if no sedation with the 100 mg  Please continue all other medications as before, and refills have been done if requested.  Please have the pharmacy call with any other refills you may need.  Please keep your appointments with your specialists as you may have planned  You will be contacted regarding the referral for: MRI, and Dr Trenton Gammon

## 2022-08-01 NOTE — Assessment & Plan Note (Signed)
Last vitamin D Lab Results  Component Value Date   VD25OH 50.43 08/09/2021   Stable, cont oral replacement

## 2022-08-01 NOTE — Progress Notes (Signed)
Patient ID: [name], female   DOB: 10-01-1951, 71 y.o.   MRN: [id number]        Chief Complaint: follow up left neck and arm pain x 3 wks       HPI:  [name] is a 71 y.o. female here with c/o mod to severe pain to the left neck, upper back shoulder and arm with numbness to the hand, and mild weakness.  Has long hx cervical spine dz s/p disc surgury per Dr Carloyn Manner over 20 yrs ago, now retired.  Pain about 8/10, constant, 3 wks, nothing makes better or worse.  Pt denies chest pain, increased sob or doe, wheezing, orthopnea, PND, increased LE swelling, palpitations, dizziness or syncope.   Pt denies polydipsia, polyuria, or new focal neuro s/s.           Wt Readings from Last 3 Encounters:  04/19/22 224 lb 9.6 oz (101.9 kg)  10/25/21 226 lb (102.5 kg)  09/27/21 225 lb 6.4 oz (102.2 kg)   BP Readings from Last 3 Encounters:  08/01/22 130/78  04/19/22 132/76  11/24/21 (!) 158/92         Past Medical History:  Diagnosis Date   Abnormal Pap smear of vagina 06/17/15   LGSIL   Allergic rhinitis, cause unspecified    Arthritis    ARTHRITIS, KNEE 02/10/2009   Qualifier: Diagnosis of  By: Niel Hummer MD, Willie R    BRCA negative 03/25/13   Cervical cancer ([place]) 1984   Cervical disc disease 02/11/2012   Chronic lower back pain    Degenerative arthritis of hip 02/11/2012   Diverticulosis 02/11/2012   GERD 08/10/2007   Qualifier: Diagnosis of  By: Sherlynn Stalls, CMA, Cindy     GERD (gastroesophageal reflux disease)    GERD (gastroesophageal reflux disease)    Hemorrhoids    HEMORRHOIDS-INTERNAL 07/21/2010   Qualifier: Diagnosis of  By: Chester Holstein NP, Paula     Hiatal hernia    Hyperlipidemia 02/13/2012   Mitral valve prolapse    no cardiologist, mild   OAB (overactive bladder)    Obesity    OVERACTIVE BLADDER 08/10/2007   Qualifier: Diagnosis of  By: Sherlynn Stalls, [place], [place]     Pneumonia 2017   PONV (postoperative nausea and vomiting)    yrs ago unable to raise arms after female surgery, no recent problems    Recurrent UTI    Sleep apnea    uses cpap set on 2   SUI (stress urinary incontinence, female)    Urinary incontinence    VAGINITIS, ATROPHIC 11/11/2008   Qualifier: Diagnosis of  By: Niel Hummer MD, Lorinda Creed    Past Surgical History:  Procedure Laterality Date   bladder tack     x 3 or 4   BUNIONECTOMY Right 2007   CERVICAL DISCECTOMY  2002   fusion and plate C 3   CHOLECYSTECTOMY N/A 08/13/2013   Procedure: LAPAROSCOPIC CHOLECYSTECTOMY WITH INTRAOPERATIVE CHOLANGIOGRAM;  Surgeon: Shann Medal, MD;  Location: [facility];  Service: General;  Laterality: N/A;   CLOSED REDUCTION METATARSAL FRACTURE     rt side 06-03-18, left side 08-19-17   COLONOSCOPY  2018   conization of cervix  1982   with D&C   CYSTOCELE REPAIR  2003   rectocele repair   EYE SURGERY Bilateral yrs ago   lasik   ORIF ANKLE FRACTURE Right 08/20/2017   Procedure: OPEN REDUCTION INTERNAL FIXATION (ORIF) ANKLE FRACTURE;  Surgeon: Meredith Pel, MD;  Location: [facility];  Service: Orthopedics;  Laterality: Right;   SHOULDER ARTHROSCOPY Right 2004   frozen shoulder   TONSILLECTOMY  as child   adenoids removed   TRANSANAL HEMORRHOIDAL DEARTERIALIZATION N/A 07/30/2020   Procedure: TRANSANAL HEMORRHOIDAL DEARTERIALIZATION;  Surgeon: Leighton Ruff, MD;  Location: [facility];  Service: General;  Laterality: N/A;   [place]   partial    reports that she has never smoked. She has never used smokeless tobacco. She reports current alcohol use. She reports that she does not use drugs. family history includes Breast cancer in her cousin, mother, and sister; Cancer in her maternal grandmother; Diabetes in her brother, maternal aunt, and maternal uncle; Heart disease in her brother and sister; Thyroid cancer in an other family member. Allergies  Allergen Reactions   Amoxicillin     REACTION: unspecified   Penicillins Other (See Comments)    Unknown reaction, mother had reaction almost died    Current Outpatient Medications on File Prior to Visit  Medication Sig Dispense Refill   Cholecalciferol (VITAMIN D) 125 MCG (5000 UT) CAPS Take 125 mcg by mouth daily.     MULTIPLE VITAMIN tablet Take 1 tablet by mouth daily.     nabumetone (RELAFEN) 750 MG tablet TAKE 1 TABLET BY MOUTH TWICE A DAY AS NEEDED FOR ARTHRITIS 180 tablet 2   pantoprazole (PROTONIX) 40 MG tablet Take 1 tablet (40 mg total) by mouth daily. 90 tablet 3   vitamin B-12 (CYANOCOBALAMIN) 1000 MCG tablet Take 5,000 mcg by mouth daily.     No current facility-administered medications on file prior to visit.        ROS:  All others reviewed and negative.  Objective        PE:  BP 130/78 (BP Location: Left Arm, Patient Position: Sitting, Cuff Size: Normal)   Pulse 71   Temp 97.9 F (36.6 C) (Oral)   SpO2 96%                 Constitutional: Pt appears in NAD               HENT: Head: NCAT.                Right Ear: External ear normal.                 Left Ear: External ear normal.                Eyes: . Pupils are equal, round, and reactive to light. Conjunctivae and EOM are normal               Nose: without d/c or deformity               Neck: Neck supple. Gross normal ROM               Cardiovascular: Normal rate and regular rhythm.                 Pulmonary/Chest: Effort normal and breath sounds without rales or wheezing.                Abd:  Soft, NT, ND, + BS, no organomegaly               Neurological: Pt is alert. At baseline orientation, motor grossly intact               Skin: Skin is warm. No rashes, no other new lesions, LE edema - none  Psychiatric: Pt behavior is normal without agitation   Micro: none  Cardiac tracings I have personally interpreted today:  none  Pertinent Radiological findings (summarize): none   Lab Results  Component Value Date   WBC 8.3 08/09/2021   HGB 14.1 08/09/2021   HCT 42.2 08/09/2021   PLT 262.0 08/09/2021   GLUCOSE 91 08/09/2021   CHOL 189  08/09/2021   TRIG 113.0 08/09/2021   HDL 46.50 08/09/2021   LDLDIRECT 135.1 05/08/2012   LDLCALC 120 (H) 08/09/2021   ALT 23 08/09/2021   AST 25 08/09/2021   NA 141 08/09/2021   K 3.6 08/09/2021   CL 105 08/09/2021   CREATININE 0.95 08/09/2021   BUN 17 08/09/2021   CO2 26 08/09/2021   TSH 2.72 08/09/2021   HGBA1C 5.8 08/09/2021   Assessment/Plan:  [name] is a 71 y.o. White or Caucasian [1] female with  has a past medical history of Abnormal Pap smear of vagina (06/17/15), Allergic rhinitis, cause unspecified, Arthritis, ARTHRITIS, KNEE (02/10/2009), BRCA negative (03/25/13), Cervical cancer ([place]) (1984), Cervical disc disease (02/11/2012), Chronic lower back pain, Degenerative arthritis of hip (02/11/2012), Diverticulosis (02/11/2012), GERD (08/10/2007), GERD (gastroesophageal reflux disease), GERD (gastroesophageal reflux disease), Hemorrhoids, HEMORRHOIDS-INTERNAL (07/21/2010), Hiatal hernia, Hyperlipidemia (02/13/2012), Mitral valve prolapse, OAB (overactive bladder), Obesity, OVERACTIVE BLADDER (08/10/2007), Pneumonia (2017), PONV (postoperative nausea and vomiting), Recurrent UTI, Sleep apnea, SUI (stress urinary incontinence, female), Urinary incontinence, and VAGINITIS, ATROPHIC (11/11/2008).  Left cervical radiculopathy 3 wks acute onset, severe pain not improving with neuro change noted on exam, had MRI jan 2023 with marked djd, ddd - now exam change and will need repeat MRI c spine , refer NS Dr Trenton Gammon, oxcyontin 10 bid x 1 wk acute tx, pt to call for refill if needed, also gabapentin 100 tid  Impaired glucose tolerance Lab Results  Component Value Date   HGBA1C 5.8 08/09/2021   Stable, pt to continue current medical treatment  - diet, wt control, exercise - for a1c with labs   Vitamin D deficiency Last vitamin D Lab Results  Component Value Date   VD25OH 50.43 08/09/2021   Stable, cont oral replacement   Hyperlipidemia Lab Results  Component Value Date   LDLCALC 120 (H)  08/09/2021   Uncontrolled, declines statin so far, pt for low chol diet, f/u lipids with next labs  Followup: Return if symptoms worsen or fail to improve.  Cathlean Cower, MD 08/01/2022 7:21 PM [facility]

## 2022-08-01 NOTE — Assessment & Plan Note (Signed)
3 wks acute onset, severe pain not improving with neuro change noted on exam, had MRI jan 2023 with marked djd, ddd - now exam change and will need repeat MRI c spine , refer NS Dr Trenton Gammon, oxcyontin 10 bid x 1 wk acute tx, pt to call for refill if needed, also gabapentin 100 tid

## 2022-08-01 NOTE — Assessment & Plan Note (Signed)
Lab Results  Component Value Date   HGBA1C 5.8 08/09/2021   Stable, pt to continue current medical treatment  - diet, wt control, exercise - for a1c with labs

## 2022-08-01 NOTE — Assessment & Plan Note (Signed)
Lab Results  Component Value Date   LDLCALC 120 (H) 08/09/2021   Uncontrolled, declines statin so far, pt for low chol diet, f/u lipids with next labs

## 2022-08-04 ENCOUNTER — Encounter: Payer: Self-pay | Admitting: Internal Medicine

## 2022-08-04 NOTE — Progress Notes (Signed)
Outside notes received. Information abstracted. Notes sent to scan.  

## 2022-08-07 ENCOUNTER — Emergency Department (HOSPITAL_BASED_OUTPATIENT_CLINIC_OR_DEPARTMENT_OTHER)
Admission: EM | Admit: 2022-08-07 | Discharge: 2022-08-07 | Disposition: A | Discharge status: Home / Self Care | Payer: Medicare Other | Attending: Emergency Medicine | Admitting: Emergency Medicine

## 2022-08-07 ENCOUNTER — Other Ambulatory Visit: Payer: Self-pay

## 2022-08-07 ENCOUNTER — Encounter (HOSPITAL_BASED_OUTPATIENT_CLINIC_OR_DEPARTMENT_OTHER): Payer: Self-pay

## 2022-08-07 ENCOUNTER — Emergency Department (HOSPITAL_BASED_OUTPATIENT_CLINIC_OR_DEPARTMENT_OTHER): Payer: Medicare Other | Admitting: Radiology

## 2022-08-07 DIAGNOSIS — R079 Chest pain, unspecified: Secondary | ICD-10-CM | POA: Insufficient documentation

## 2022-08-07 DIAGNOSIS — R0789 Other chest pain: Secondary | ICD-10-CM | POA: Diagnosis not present

## 2022-08-07 LAB — CBC
HCT: 39.1 % (ref 36.0–46.0)
Hemoglobin: 13 g/dL (ref 12.0–15.0)
MCH: 30 pg (ref 26.0–34.0)
MCHC: 33.2 g/dL (ref 30.0–36.0)
MCV: 90.1 fL (ref 80.0–100.0)
Platelets: 220 10*3/uL (ref 150–400)
RBC: 4.34 MIL/uL (ref 3.87–5.11)
RDW: 12.6 % (ref 11.5–15.5)
WBC: 9.7 10*3/uL (ref 4.0–10.5)
nRBC: 0 % (ref 0.0–0.2)

## 2022-08-07 LAB — BASIC METABOLIC PANEL
Anion gap: 11 (ref 5–15)
BUN: 22 mg/dL (ref 8–23)
CO2: 24 mmol/L (ref 22–32)
Calcium: 9.2 mg/dL (ref 8.9–10.3)
Chloride: 105 mmol/L (ref 98–111)
Creatinine, Ser: 0.93 mg/dL (ref 0.44–1.00)
GFR, Estimated: >60 mL/min (ref >60)
Glucose, Bld: 117 mg/dL — ABNORMAL HIGH (ref 70–99)
Potassium: 3.5 mmol/L (ref 3.5–5.1)
Sodium: 140 mmol/L (ref 135–145)

## 2022-08-07 LAB — TROPONIN I (HIGH SENSITIVITY)
Troponin I (High Sensitivity): <2 ng/L (ref <18)
Troponin I (High Sensitivity): <2 ng/L (ref <18)

## 2022-08-07 MED ORDER — ASPIRIN 81 MG PO CHEW
324.0000 mg | CHEWABLE_TABLET | Freq: Once | ORAL | Status: AC
  Administered 2022-08-07: 324 mg via ORAL
  Filled 2022-08-07: qty 4

## 2022-08-07 NOTE — Discharge Instructions (Signed)
If you develop recurrent, continued, or worsening chest pain, shortness of breath, fever, vomiting, abdominal or back pain, or any other new/concerning symptoms then return to the ER for evaluation.  

## 2022-08-07 NOTE — ED Provider Notes (Signed)
[facility] Provider Note   CSN: [id number] Arrival date & time: 08/07/22  1719     History  Chief Complaint  Patient presents with   Chest Pain    [name] is a 71 y.o. female.  HPI 71 year old female with a history of hyperlipidemia presents with chest pain.  Started around 3 PM this afternoon.  Feels like spasms in her chest.  She states that it has been coming and going without obvious cause.  Is primarily right sided.  No radiation of the pain, shortness of breath.  Nothing specific makes it occur such as exertion or movements.  She denies a history of hypertension, diabetes, known cardiac disease.  States there is cardiac disease in her family.  Currently not having any symptoms.  No new unilateral leg swelling.  She states has been dealing with arm pain for a long time but is not new or worse with this.  Home Medications Prior to Admission medications   Medication Sig Start Date End Date Taking? Authorizing Provider  Cholecalciferol (VITAMIN D) 125 MCG (5000 UT) CAPS Take 125 mcg by mouth daily.    [provider]  gabapentin (NEURONTIN) 100 MG capsule Take 1 capsule (100 mg total) by mouth 3 (three) times daily. 08/01/22   Biagio Borg, MD  MULTIPLE VITAMIN tablet Take 1 tablet by mouth daily.    [provider]  nabumetone (RELAFEN) 750 MG tablet TAKE 1 TABLET BY MOUTH TWICE A DAY AS NEEDED FOR ARTHRITIS 10/04/20   Biagio Borg, MD  oxyCODONE (OXYCONTIN) 10 mg 12 hr tablet Take 1 tablet (10 mg total) by mouth every 12 (twelve) hours. 08/01/22   Biagio Borg, MD  pantoprazole (PROTONIX) 40 MG tablet Take 1 tablet (40 mg total) by mouth daily. 08/09/21   Biagio Borg, MD  vitamin B-12 (CYANOCOBALAMIN) 1000 MCG tablet Take 5,000 mcg by mouth daily.    [provider]      Allergies    Amoxicillin and Penicillins    Review of Systems   Review of Systems  Respiratory:  Negative for shortness of breath.    Cardiovascular:  Positive for chest pain. Negative for leg swelling.    Physical Exam Updated Vital Signs BP 130/74 (BP Location: Right Arm)   Pulse 71   Temp 98.1 F (36.7 C) (Oral)   Resp (!) 21   Ht '5\' 7"'$  (1.702 m)   Wt 101.9 kg   SpO2 96%   BMI 35.18 kg/m  Physical Exam Vitals and nursing note reviewed.  Constitutional:      General: She is not in acute distress.    Appearance: She is well-developed. She is not ill-appearing or diaphoretic.  HENT:     Head: Normocephalic and atraumatic.  Cardiovascular:     Rate and Rhythm: Normal rate and regular rhythm.     Heart sounds: Normal heart sounds.  Pulmonary:     Effort: Pulmonary effort is normal.     Breath sounds: Normal breath sounds.  Abdominal:     Palpations: Abdomen is soft.     Tenderness: There is no abdominal tenderness.  Musculoskeletal:     Right lower leg: No edema.     Left lower leg: No edema.  Skin:    General: Skin is warm and dry.  Neurological:     Mental Status: She is alert.     ED Results / Procedures / Treatments   Labs (all labs ordered are listed, but  only abnormal results are displayed) Labs Reviewed  BASIC METABOLIC PANEL - Abnormal; Notable for the following components:      Result Value   Glucose, Bld 117 (*)    All other components within normal limits  CBC  TROPONIN I (HIGH SENSITIVITY)  TROPONIN I (HIGH SENSITIVITY)    EKG EKG Interpretation  Date/Time:  Sunday August 07 2022 17:47:53 EDT Ventricular Rate:  75 PR Interval:  162 QRS Duration: 82 QT Interval:  422 QTC Calculation: 471 R Axis:   58 Text Interpretation: Normal sinus rhythm Low voltage QRS Cannot rule out Anterior infarct , age undetermined Abnormal ECG Poor data quality in current ECG precludes serial comparison overall similar to 2014 Confirmed by Sherwood Gambler [id number]) on 08/07/2022 9:45:46 PM  Radiology DG Chest 2 View  Result Date: 08/07/2022 CLINICAL DATA:  Mid chest pain EXAM: CHEST - 2 VIEW  COMPARISON:  Chest two views 08/09/2021 FINDINGS: Cardiac silhouette and mediastinal contours are within normal limits. Mild calcification within the aortic arch. Mild elevation of the right hemidiaphragm, unchanged. The lungs are clear. No pleural effusion or pneumothorax. Mild multilevel disc space narrowing of the thoracic spine. Cholecystectomy clips. IMPRESSION: No active cardiopulmonary disease. Electronically Signed   By: Yvonne Kendall M.D.   On: 08/07/2022 18:44    Procedures Procedures    Medications Ordered in ED Medications  aspirin chewable tablet 324 mg (324 mg Oral Given 08/07/22 2212)    ED Course/ Medical Decision Making/ A&P                           Medical Decision Making Amount and/or Complexity of Data Reviewed Labs: ordered.    Details: Normal troponins x2. Radiology: ordered and independent interpretation performed.    Details: No CHF. ECG/medicine tests: independent interpretation performed.    Details: Unchanged nonspecific T waves from baseline ECG.  Risk OTC drugs.   Patient does have some risk factors for cardiac disease including age and hyperlipidemia.  However her presentation is so atypical and troponins are negative x2 and no new ECG changes that I think ACS is quite unlikely.  I discussed we cannot fully rule out cardiac disease and she will need to follow-up with her PCP, who she already has an appointment with in the next few days.  Otherwise, I have low suspicion for PE, dissection, esophageal emergency.  Will discharge home with return precautions.        Final Clinical Impression(s) / ED Diagnoses Final diagnoses:  Right-sided chest pain    Rx / DC Orders ED Discharge Orders     None         Sherwood Gambler, MD 08/07/22 2308

## 2022-08-07 NOTE — ED Notes (Signed)
Pt discharged to home, NAD noted at time of discharge

## 2022-08-07 NOTE — ED Triage Notes (Signed)
Patient here POV from Home.  Endorses CP that is Mid Chest and began 1 few hours ago. Intermittent in nature.  No Other Associated Symptoms besides Left Arm Pain that has been present for several Weeks.   NAD Noted during Triage. A&Ox4. GCS 15. Ambulatory.

## 2022-08-08 ENCOUNTER — Telehealth: Payer: Self-pay

## 2022-08-08 DIAGNOSIS — M5412 Radiculopathy, cervical region: Secondary | ICD-10-CM | POA: Diagnosis not present

## 2022-08-08 DIAGNOSIS — M7061 Trochanteric bursitis, right hip: Secondary | ICD-10-CM | POA: Diagnosis not present

## 2022-08-08 NOTE — Telephone Encounter (Signed)
Transition Care Management Follow-up Telephone Call Date of discharge and from where: 08/07/22 [facility] How have you been since you were released from the hospital? Not in anymore pain Any questions or concerns? No  Items Reviewed: Did the pt receive and understand the discharge instructions provided? Yes  Medications obtained and verified? No  Other? No  Any new allergies since your discharge? No  Dietary orders reviewed? No Do you have support at home? Yes   Home Care and Equipment/Supplies: Were home health services ordered? no If so, what is the name of the agency?   Has the agency set up a time to come to the patient's home? not applicable Were any new equipment or medical supplies ordered?  No What is the name of the medical supply agency?  Were you able to get the supplies/equipment? no Do you have any questions related to the use of the equipment or supplies? No  Functional Questionnaire: (I = Independent and D = Dependent) ADLs:   Bathing/Dressing-   Meal Prep-   Eating-   Maintaining continence-   Transferring/Ambulation-  Managing Meds-  Follow up appointments reviewed:  PCP Hospital f/u appt confirmed? Yes  Scheduled to see Dr.John on 10/3 @ 10am for a well visit. [facility] f/u appt confirmed?     Scheduled to see on  Are transportation arrangements needed? No  If their condition worsens, is the pt aware to call PCP or go to the Emergency Dept.? No Was the patient provided with contact information for the PCP's office or ED? No Was to pt encouraged to call back with questions or concerns? No

## 2022-08-10 ENCOUNTER — Ambulatory Visit (INDEPENDENT_AMBULATORY_CARE_PROVIDER_SITE_OTHER): Payer: Medicare Other | Admitting: Internal Medicine

## 2022-08-10 ENCOUNTER — Other Ambulatory Visit: Payer: Self-pay | Admitting: Internal Medicine

## 2022-08-10 VITALS — BP 124/72 | HR 68 | Temp 98.1°F | Ht 67.0 in | Wt 227.0 lb

## 2022-08-10 DIAGNOSIS — I700 Atherosclerosis of aorta: Secondary | ICD-10-CM

## 2022-08-10 DIAGNOSIS — R7302 Impaired glucose tolerance (oral): Secondary | ICD-10-CM

## 2022-08-10 DIAGNOSIS — Z23 Encounter for immunization: Secondary | ICD-10-CM | POA: Diagnosis not present

## 2022-08-10 DIAGNOSIS — E559 Vitamin D deficiency, unspecified: Secondary | ICD-10-CM

## 2022-08-10 DIAGNOSIS — E7800 Pure hypercholesterolemia, unspecified: Secondary | ICD-10-CM | POA: Diagnosis not present

## 2022-08-10 DIAGNOSIS — E538 Deficiency of other specified B group vitamins: Secondary | ICD-10-CM | POA: Diagnosis not present

## 2022-08-10 DIAGNOSIS — M5412 Radiculopathy, cervical region: Secondary | ICD-10-CM | POA: Diagnosis not present

## 2022-08-10 LAB — URINALYSIS, ROUTINE W REFLEX MICROSCOPIC
Bilirubin Urine: NEGATIVE
Hgb urine dipstick: NEGATIVE
Nitrite: POSITIVE — AB
RBC / HPF: NONE SEEN (ref 0–?)
Specific Gravity, Urine: >=1.03 — AB (ref 1.000–1.030)
Total Protein, Urine: NEGATIVE
Urine Glucose: NEGATIVE
Urobilinogen, UA: 0.2 (ref 0.0–1.0)
pH: 5.5 (ref 5.0–8.0)

## 2022-08-10 LAB — CBC WITH DIFFERENTIAL/PLATELET
Basophils Absolute: 0.1 10*3/uL (ref 0.0–0.1)
Basophils Relative: 0.8 % (ref 0.0–3.0)
Eosinophils Absolute: 0.2 10*3/uL (ref 0.0–0.7)
Eosinophils Relative: 2.3 % (ref 0.0–5.0)
HCT: 39.6 % (ref 36.0–46.0)
Hemoglobin: 13.5 g/dL (ref 12.0–15.0)
Lymphocytes Relative: 27.7 % (ref 12.0–46.0)
Lymphs Abs: 2.8 10*3/uL (ref 0.7–4.0)
MCHC: 34 g/dL (ref 30.0–36.0)
MCV: 88.2 fl (ref 78.0–100.0)
Monocytes Absolute: 0.6 10*3/uL (ref 0.1–1.0)
Monocytes Relative: 6.2 % (ref 3.0–12.0)
Neutro Abs: 6.4 10*3/uL (ref 1.4–7.7)
Neutrophils Relative %: 63 % (ref 43.0–77.0)
Platelets: 246 10*3/uL (ref 150.0–400.0)
RBC: 4.49 Mil/uL (ref 3.87–5.11)
RDW: 13.1 % (ref 11.5–15.5)
WBC: 10.1 10*3/uL (ref 4.0–10.5)

## 2022-08-10 LAB — LIPID PANEL
Cholesterol: 178 mg/dL (ref 0–200)
HDL: 48.6 mg/dL (ref >39.00)
LDL Cholesterol: 105 mg/dL — ABNORMAL HIGH (ref 0–99)
NonHDL: 129.74
Total CHOL/HDL Ratio: 4
Triglycerides: 122 mg/dL (ref 0.0–149.0)
VLDL: 24.4 mg/dL (ref 0.0–40.0)

## 2022-08-10 LAB — BASIC METABOLIC PANEL
BUN: 19 mg/dL (ref 6–23)
CO2: 27 mEq/L (ref 19–32)
Calcium: 9.8 mg/dL (ref 8.4–10.5)
Chloride: 106 mEq/L (ref 96–112)
Creatinine, Ser: 0.84 mg/dL (ref 0.40–1.20)
GFR: 71.19 mL/min (ref >60.00)
Glucose, Bld: 108 mg/dL — ABNORMAL HIGH (ref 70–99)
Potassium: 3.6 mEq/L (ref 3.5–5.1)
Sodium: 141 mEq/L (ref 135–145)

## 2022-08-10 LAB — HEPATIC FUNCTION PANEL
ALT: 16 U/L (ref 0–35)
AST: 17 U/L (ref 0–37)
Albumin: 4.1 g/dL (ref 3.5–5.2)
Alkaline Phosphatase: 76 U/L (ref 39–117)
Bilirubin, Direct: 0.1 mg/dL (ref 0.0–0.3)
Total Bilirubin: 0.6 mg/dL (ref 0.2–1.2)
Total Protein: 7.1 g/dL (ref 6.0–8.3)

## 2022-08-10 LAB — TSH: TSH: 1.56 u[IU]/mL (ref 0.35–5.50)

## 2022-08-10 LAB — VITAMIN D 25 HYDROXY (VIT D DEFICIENCY, FRACTURES): VITD: 58.37 ng/mL (ref 30.00–100.00)

## 2022-08-10 LAB — HEMOGLOBIN A1C: Hgb A1c MFr Bld: 6 % (ref 4.6–6.5)

## 2022-08-10 LAB — VITAMIN B12: Vitamin B-12: >1500 pg/mL — ABNORMAL HIGH (ref 211–911)

## 2022-08-10 MED ORDER — ROSUVASTATIN CALCIUM 10 MG PO TABS: 10.0000 mg | ORAL_TABLET | Freq: Every day | ORAL | 3 refills | Status: DC

## 2022-08-10 NOTE — Patient Instructions (Signed)
You had the flu shot today  Please continue all other medications as before  Please have the pharmacy call with any other refills you may need.  Please continue your efforts at being more active, low cholesterol diet, and weight control.  You are otherwise up to date with prevention measures today.  Please keep your appointments with your specialists as you may have planned - the MRI for monday  Please go to the LAB at the blood drawing area for the tests to be done  You will be contacted by phone if any changes need to be made immediately.  Otherwise, you will receive a letter about your results with an explanation, but please check with MyChart first.  Please remember to sign up for MyChart if you have not done so, as this will be important to you in the future with finding out test results, communicating by private email, and scheduling acute appointments online when needed.  Please make an Appointment to return in 6 months, or sooner if needed

## 2022-08-10 NOTE — Progress Notes (Addendum)
Patient ID: [name], female   DOB: 03-03-1951, 71 y.o.   MRN: [id number]        Chief Complaint: follow up chest pain, left cervical radiculitis, low D, hld, hyperglycemia       HPI:  [name] is a 71 y.o. female here Pt denies chest pain, increased sob or doe, wheezing, orthopnea, PND, increased LE swelling, palpitations, dizziness or syncope, though did have chest pain in ED oct 1, MI ruled out.   Pt denies polydipsia, polyuria, or new focal neuro s/s.    Pt denies fever, wt loss, night sweats, loss of appetite, or other constitutional symptoms   Saw NS Dr Reatha Armour, has MRI pending for oct 9 as she has much worsening mod to occas severe left cervical radicular pain, but no worsening weakness for now.  Due for flu shot, will have shingrix at the pharmacy Wt Readings from Last 3 Encounters:  08/10/22 227 lb (103 kg)  08/07/22 224 lb 10.4 oz (101.9 kg)  04/19/22 224 lb 9.6 oz (101.9 kg)   BP Readings from Last 3 Encounters:  08/10/22 124/72  08/07/22 130/74  08/01/22 130/78         Past Medical History:  Diagnosis Date   Abnormal Pap smear of vagina 06/17/15   LGSIL   Allergic rhinitis, cause unspecified    Arthritis    ARTHRITIS, KNEE 02/10/2009   Qualifier: Diagnosis of  By: Niel Hummer MD, Willie R    BRCA negative 03/25/13   Cervical cancer ([place]) 1984   Cervical disc disease 02/11/2012   Chronic lower back pain    Degenerative arthritis of hip 02/11/2012   Diverticulosis 02/11/2012   GERD 08/10/2007   Qualifier: Diagnosis of  By: Sherlynn Stalls, CMA, Cindy     GERD (gastroesophageal reflux disease)    GERD (gastroesophageal reflux disease)    Hemorrhoids    HEMORRHOIDS-INTERNAL 07/21/2010   Qualifier: Diagnosis of  By: Chester Holstein NP, Paula     Hiatal hernia    Hyperlipidemia 02/13/2012   Mitral valve prolapse    no cardiologist, mild   OAB (overactive bladder)    Obesity    OVERACTIVE BLADDER 08/10/2007   Qualifier: Diagnosis of  By: Sherlynn Stalls, [place], [place]     Pneumonia 2017   PONV  (postoperative nausea and vomiting)    yrs ago unable to raise arms after female surgery, no recent problems   Recurrent UTI    Sleep apnea    uses cpap set on 2   SUI (stress urinary incontinence, female)    Urinary incontinence    VAGINITIS, ATROPHIC 11/11/2008   Qualifier: Diagnosis of  By: Niel Hummer MD, Lorinda Creed    Past Surgical History:  Procedure Laterality Date   bladder tack     x 3 or 4   BUNIONECTOMY Right 2007   CERVICAL DISCECTOMY  2002   fusion and plate C 3   CHOLECYSTECTOMY N/A 08/13/2013   Procedure: LAPAROSCOPIC CHOLECYSTECTOMY WITH INTRAOPERATIVE CHOLANGIOGRAM;  Surgeon: Shann Medal, MD;  Location: [facility];  Service: General;  Laterality: N/A;   CLOSED REDUCTION METATARSAL FRACTURE     rt side 06-03-18, left side 08-19-17   COLONOSCOPY  2018   conization of cervix  1982   with D&C   CYSTOCELE REPAIR  2003   rectocele repair   EYE SURGERY Bilateral yrs ago   lasik   ORIF ANKLE FRACTURE Right 08/20/2017   Procedure: OPEN REDUCTION INTERNAL FIXATION (ORIF) ANKLE FRACTURE;  Surgeon: Marcene Duos  Nicki Reaper, MD;  Location: [facility];  Service: Orthopedics;  Laterality: Right;   SHOULDER ARTHROSCOPY Right 2004   frozen shoulder   TONSILLECTOMY  as child   adenoids removed   TRANSANAL HEMORRHOIDAL DEARTERIALIZATION N/A 07/30/2020   Procedure: TRANSANAL HEMORRHOIDAL DEARTERIALIZATION;  Surgeon: Leighton Ruff, MD;  Location: [facility];  Service: General;  Laterality: N/A;   [place]   partial    reports that she has never smoked. She has never used smokeless tobacco. She reports current alcohol use. She reports that she does not use drugs. family history includes Breast cancer in her cousin, mother, and sister; Cancer in her maternal grandmother; Diabetes in her brother, maternal aunt, and maternal uncle; Heart disease in her brother and sister; Thyroid cancer in an other family member. Allergies  Allergen Reactions   Amoxicillin      REACTION: unspecified   Penicillins Other (See Comments)    Unknown reaction, mother had reaction almost died   Current Outpatient Medications on File Prior to Visit  Medication Sig Dispense Refill   Cholecalciferol (VITAMIN D) 125 MCG (5000 UT) CAPS Take 125 mcg by mouth daily.     gabapentin (NEURONTIN) 100 MG capsule Take 1 capsule (100 mg total) by mouth 3 (three) times daily. 90 capsule 3   MULTIPLE VITAMIN tablet Take 1 tablet by mouth daily.     nabumetone (RELAFEN) 750 MG tablet TAKE 1 TABLET BY MOUTH TWICE A DAY AS NEEDED FOR ARTHRITIS 180 tablet 2   oxyCODONE (OXYCONTIN) 10 mg 12 hr tablet Take 1 tablet (10 mg total) by mouth every 12 (twelve) hours. 14 tablet 0   vitamin B-12 (CYANOCOBALAMIN) 1000 MCG tablet Take 5,000 mcg by mouth daily.     No current facility-administered medications on file prior to visit.        ROS:  All others reviewed and negative.  Objective        PE:  BP 124/72 (BP Location: Right Arm, Patient Position: Sitting, Cuff Size: Large)   Pulse 68   Temp 98.1 F (36.7 C) (Oral)   Ht _0  (1.702 m)   Wt 227 lb (103 kg)   SpO2 94%   BMI 35.55 kg/m                 Constitutional: Pt appears in NAD               HENT: Head: NCAT.                Right Ear: External ear normal.                 Left Ear: External ear normal.                Eyes: . Pupils are equal, round, and reactive to light. Conjunctivae and EOM are normal               Nose: without d/c or deformity               Neck: Neck supple. Gross normal ROM               Cardiovascular: Normal rate and regular rhythm.                 Pulmonary/Chest: Effort normal and breath sounds without rales or wheezing.                Abd:  Soft, NT, ND, + BS, no organomegaly  Neurological: Pt is alert. At baseline orientation, cn 2-12 intact               Skin: Skin is warm. No rashes, no other new lesions, LE edema - none               Psychiatric: Pt behavior is normal without  agitation   Micro: none  Cardiac tracings I have personally interpreted today:  none  Pertinent Radiological findings (summarize): none   Lab Results  Component Value Date   WBC 10.1 08/10/2022   HGB 13.5 08/10/2022   HCT 39.6 08/10/2022   PLT 246.0 08/10/2022   GLUCOSE 108 (H) 08/10/2022   CHOL 178 08/10/2022   TRIG 122.0 08/10/2022   HDL 48.60 08/10/2022   LDLDIRECT 135.1 05/08/2012   LDLCALC 105 (H) 08/10/2022   ALT 16 08/10/2022   AST 17 08/10/2022   NA 141 08/10/2022   K 3.6 08/10/2022   CL 106 08/10/2022   CREATININE 0.84 08/10/2022   BUN 19 08/10/2022   CO2 27 08/10/2022   TSH 1.56 08/10/2022   HGBA1C 6.0 08/10/2022   Assessment/Plan:  [name] is a 71 y.o. White or Caucasian [1] female with  has a past medical history of Abnormal Pap smear of vagina (06/17/15), Allergic rhinitis, cause unspecified, Arthritis, ARTHRITIS, KNEE (02/10/2009), BRCA negative (03/25/13), Cervical cancer ([place]) (1984), Cervical disc disease (02/11/2012), Chronic lower back pain, Degenerative arthritis of hip (02/11/2012), Diverticulosis (02/11/2012), GERD (08/10/2007), GERD (gastroesophageal reflux disease), GERD (gastroesophageal reflux disease), Hemorrhoids, HEMORRHOIDS-INTERNAL (07/21/2010), Hiatal hernia, Hyperlipidemia (02/13/2012), Mitral valve prolapse, OAB (overactive bladder), Obesity, OVERACTIVE BLADDER (08/10/2007), Pneumonia (2017), PONV (postoperative nausea and vomiting), Recurrent UTI, Sleep apnea, SUI (stress urinary incontinence, female), Urinary incontinence, and VAGINITIS, ATROPHIC (11/11/2008).  Left cervical radiculopathy With increased symptoms in severity and frequency - for f/u MRI oct 9 and NS as planned  Vitamin D deficiency Last vitamin D Lab Results  Component Value Date   VD25OH 58.37 08/10/2022   Stable, cont oral replacement   Impaired glucose tolerance Lab Results  Component Value Date   HGBA1C 6.0 08/10/2022   Stable, pt to continue current medical treatment  -  diet, wt control, excercise   Hyperlipidemia Lab Results  Component Value Date   LDLCALC 105 (H) 08/10/2022   Uncontrolled, pt to start crestor 10 mg qd   Aortic atherosclerosis (HCC) Pt to start crestor 10 mg, also for Cardiac CT score Followup: Return in about 6 months (around 02/09/2023).  Cathlean Cower, MD 08/13/2022 3:05 PM [facility]

## 2022-08-13 ENCOUNTER — Encounter: Payer: Self-pay | Admitting: Internal Medicine

## 2022-08-13 NOTE — Assessment & Plan Note (Signed)
Pt to start crestor 10 mg, also for Cardiac CT score

## 2022-08-13 NOTE — Assessment & Plan Note (Signed)
With increased symptoms in severity and frequency - for f/u MRI oct 9 and NS as planned

## 2022-08-13 NOTE — Assessment & Plan Note (Signed)
Lab Results  Component Value Date   HGBA1C 6.0 08/10/2022   Stable, pt to continue current medical treatment  - diet, wt control, excercise  

## 2022-08-13 NOTE — Assessment & Plan Note (Signed)
Lab Results  Component Value Date   LDLCALC 105 (H) 08/10/2022   Uncontrolled, pt to start crestor 10 mg qd

## 2022-08-13 NOTE — Assessment & Plan Note (Signed)
Last vitamin D Lab Results  Component Value Date   VD25OH 58.37 08/10/2022   Stable, cont oral replacement  

## 2022-08-15 ENCOUNTER — Ambulatory Visit
Admission: RE | Admit: 2022-08-15 | Discharge: 2022-08-15 | Disposition: A | Discharge status: Home / Self Care | Payer: Medicare Other | Source: Ambulatory Visit | Attending: Internal Medicine | Admitting: Internal Medicine

## 2022-08-15 DIAGNOSIS — M5412 Radiculopathy, cervical region: Secondary | ICD-10-CM

## 2022-08-15 DIAGNOSIS — M542 Cervicalgia: Secondary | ICD-10-CM | POA: Diagnosis not present

## 2022-08-16 ENCOUNTER — Encounter: Payer: Self-pay | Admitting: Internal Medicine

## 2022-08-17 NOTE — Telephone Encounter (Signed)
No need if no symptoms or fever.  Also, the WBC are always elevated somewhat in the past urine testing as well, so this would argue against need for new treatment

## 2022-08-17 NOTE — Telephone Encounter (Signed)
I'm not sure if there is a need for abx for high wbc, please advise as patient is inquiring.

## 2022-08-23 DIAGNOSIS — Z780 Asymptomatic menopausal state: Secondary | ICD-10-CM | POA: Diagnosis not present

## 2022-08-23 DIAGNOSIS — M8589 Other specified disorders of bone density and structure, multiple sites: Secondary | ICD-10-CM | POA: Diagnosis not present

## 2022-08-23 LAB — HM DEXA SCAN: HM Dexa Scan: -2.1

## 2022-08-30 DIAGNOSIS — M5412 Radiculopathy, cervical region: Secondary | ICD-10-CM | POA: Diagnosis not present

## 2022-08-31 ENCOUNTER — Encounter: Payer: Self-pay | Admitting: *Deleted

## 2022-09-08 ENCOUNTER — Ambulatory Visit (INDEPENDENT_AMBULATORY_CARE_PROVIDER_SITE_OTHER): Payer: Medicare Other | Admitting: Nurse Practitioner

## 2022-09-08 VITALS — BP 126/82 | HR 82 | Temp 98.0°F | Ht 67.0 in | Wt 220.2 lb

## 2022-09-08 DIAGNOSIS — J069 Acute upper respiratory infection, unspecified: Secondary | ICD-10-CM

## 2022-09-08 LAB — POC COVID19 BINAXNOW: SARS Coronavirus 2 Ag: NEGATIVE

## 2022-09-08 LAB — POCT INFLUENZA A/B
Influenza A, POC: NEGATIVE
Influenza B, POC: NEGATIVE

## 2022-09-08 MED ORDER — AZITHROMYCIN 250 MG PO TABS: ORAL_TABLET | ORAL | 0 refills | Status: AC

## 2022-09-08 MED ORDER — PROMETHAZINE-DM 6.25-15 MG/5ML PO SYRP: 5.0000 mL | ORAL_SOLUTION | Freq: Four times a day (QID) | ORAL | 0 refills | Status: DC | PRN

## 2022-09-08 NOTE — Assessment & Plan Note (Signed)
Acute, symptoms lasting greater than 2 weeks.  Point-of-care COVID and flu test negative.  We will treat with Z-Pak and promethazine-guaifenesin cough syrup to take in as needed.  Patient educated not to drive will taking cough syrup.  Patient encouraged to follow-up if symptoms persist or worsen despite completing her course of antibiotics.  Patient reports understanding.

## 2022-09-08 NOTE — Progress Notes (Signed)
   Established Patient Office Visit  Subjective   Patient ID: [name], female    DOB: 03/20/1951  Age: 71 y.o. MRN: [id number]  Chief Complaint  Patient presents with   Cough   Symptoms started 2.5 weeks ago.  She is experiencing productive cough and wheezing early in the morning and late at night.  She reports having test herself for COVID at home x2 both of which were negative.  Creatinine clearance based on last blood work is approximately 60.     Review of Systems  Constitutional:  Positive for malaise/fatigue. Negative for fever.  Respiratory:  Positive for cough, sputum production and wheezing. Negative for shortness of breath.   Cardiovascular:  Negative for chest pain.  Gastrointestinal:  Negative for diarrhea, nausea and vomiting.       (+) anorexia  Musculoskeletal:  Negative for myalgias.      Objective:     BP 126/82   Pulse 82   Temp 98 F (36.7 C) (Oral)   Ht '5\' 7"'$  (1.702 m)   Wt 220 lb 4 oz (99.9 kg)   SpO2 95%   BMI 34.50 kg/m  BP Readings from Last 3 Encounters:  09/08/22 126/82  08/10/22 124/72  08/07/22 130/74   Wt Readings from Last 3 Encounters:  09/08/22 220 lb 4 oz (99.9 kg)  08/10/22 227 lb (103 kg)  08/07/22 224 lb 10.4 oz (101.9 kg)      Physical Exam Vitals reviewed.  Constitutional:      General: She is not in acute distress.    Appearance: Normal appearance.  HENT:     Head: Normocephalic and atraumatic.  Neck:     Vascular: No carotid bruit.  Cardiovascular:     Rate and Rhythm: Normal rate and regular rhythm.     Pulses: Normal pulses.     Heart sounds: Normal heart sounds.  Pulmonary:     Effort: Pulmonary effort is normal.     Breath sounds: Normal breath sounds. No wheezing, rhonchi or rales.     Comments: Coughing during exam Skin:    General: Skin is warm and dry.  Neurological:     General: No focal deficit present.     Mental Status: She is alert and oriented to person, place, and time.  Psychiatric:         Mood and Affect: Mood normal.        Behavior: Behavior normal.        Judgment: Judgment normal.      No results found for any visits on 09/08/22.    The 10-year ASCVD risk score (Arnett DK, et al., 2019) is: 7.4%    Assessment & Plan:   Problem List Items Addressed This Visit       Respiratory   Acute upper respiratory infection - Primary    Acute, symptoms lasting greater than 2 weeks.  Point-of-care COVID and flu test negative.  We will treat with Z-Pak and promethazine-guaifenesin cough syrup to take in as needed.  Patient educated not to drive will taking cough syrup.  Patient encouraged to follow-up if symptoms persist or worsen despite completing her course of antibiotics.  Patient reports understanding.      Relevant Medications   azithromycin (ZITHROMAX) 250 MG tablet   promethazine-dextromethorphan (PROMETHAZINE-DM) 6.25-15 MG/5ML syrup    Return if symptoms worsen or fail to improve.    Ailene Ards, NP

## 2022-09-08 NOTE — Progress Notes (Deleted)
Annual Wellness Visit     Patient: [name], Female    DOB: 02/10/1951, 71 y.o.   MRN: [id number]  Subjective  No chief complaint on file.   [name] is a 71 y.o. female who presents today for her Annual Wellness Visit. She reports consuming a {diet types:17450} diet. {Exercise:19826} She generally feels {well/fairly well/poorly:18703}. She reports sleeping {well/fairly well/poorly:18703}. She {does/does not:200015} have additional problems to discuss today.   HPI  {VISON DENTAL STD PSA (Optional):27386}   {History (Optional):23778}  Medications: Outpatient Medications Prior to Visit  Medication Sig   Cholecalciferol (VITAMIN D) 125 MCG (5000 UT) CAPS Take 125 mcg by mouth daily.   gabapentin (NEURONTIN) 100 MG capsule Take 1 capsule (100 mg total) by mouth 3 (three) times daily.   MULTIPLE VITAMIN tablet Take 1 tablet by mouth daily.   nabumetone (RELAFEN) 750 MG tablet TAKE 1 TABLET BY MOUTH TWICE A DAY AS NEEDED FOR ARTHRITIS   rosuvastatin (CRESTOR) 10 MG tablet Take 1 tablet (10 mg total) by mouth daily.   vitamin B-12 (CYANOCOBALAMIN) 1000 MCG tablet Take 5,000 mcg by mouth daily.   [DISCONTINUED] oxyCODONE (OXYCONTIN) 10 mg 12 hr tablet Take 1 tablet (10 mg total) by mouth every 12 (twelve) hours.   No facility-administered medications prior to visit.    Allergies  Allergen Reactions   Amoxicillin     REACTION: unspecified   Penicillins Other (See Comments)    Unknown reaction, mother had reaction almost died    Patient Care Team: Biagio Borg, MD as PCP - General (Internal Medicine)  ROS      Objective  BP 126/82   Pulse 82   Temp 98 F (36.7 C) (Oral)   Ht '5\' 7"'$  (1.702 m)   Wt 220 lb 4 oz (99.9 kg)   SpO2 95%   BMI 34.50 kg/m  {Vitals History (Optional):23777}  Physical Exam    Most recent functional status assessment:     No data to display         Most recent fall risk assessment:    09/08/2022    8:09 AM  [place] in the past year? 0  Number falls in past yr: 0  Injury with Fall? 0  Risk for fall due to : No Fall Risks  Follow up Falls evaluation completed    Most recent depression screenings:    09/08/2022    8:10 AM 08/10/2022   10:06 AM  PHQ 2/9 Scores  PHQ - 2 Score 0   PHQ- 9 Score 0   Exception Documentation  Patient refusal   Most recent cognitive screening:     No data to display         Most recent Audit-C alcohol use screening     No data to display         A score of 3 or more in women, and 4 or more in men indicates increased risk for alcohol abuse, EXCEPT if all of the points are from question 1   Vision/Hearing Screen: No results found.  {Labs (Optional):23779}  No results found for any visits on 09/08/22.    Assessment & Plan   Annual wellness visit done today including the all of the following: Reviewed patient's Family Medical History Reviewed and updated list of patient's medical providers Assessment of cognitive impairment was done Assessed patient's functional ability Established a written schedule for health screening [place] Completed and Reviewed  Exercise Activities and Dietary recommendations  Goals   None     Immunization History  Administered Date(s) Administered   Fluad Quad(high Dose 65+) 08/02/2019, 08/06/2020, 08/09/2021, 08/10/2022   Influenza Split 09/12/2012   Influenza Whole 10/03/2007, 08/16/2010   Influenza,inj,Quad PF,6+ Mos 08/07/2013, 07/18/2014, 07/21/2015, 07/21/2016, 07/28/2017, 07/31/2018   PFIZER(Purple Top)SARS-COV-2 Vaccination 12/13/2019, 01/07/2020, 10/12/2020   Pneumococcal Conjugate-13 08/19/2019   Td 09/08/2003   Tdap 03/20/2014    Health Maintenance  Topic Date Due   Medicare Annual Wellness (AWV)  Never done   COVID-19 Vaccine (4 - Pfizer series) 12/07/2020   Zoster Vaccines- Shingrix (1 of 2) 11/10/2022 (Originally 10/09/2003)   Pneumonia Vaccine 60+ Years old (2 - PPSV23  or PCV20) 08/11/2023 (Originally 08/18/2020)   TETANUS/TDAP  03/20/2024   MAMMOGRAM  07/26/2024   COLONOSCOPY (Pts 45-52yr Insurance coverage will need to be confirmed)  07/12/2027   INFLUENZA VACCINE  Completed   DEXA SCAN  Completed   Hepatitis C Screening  Completed   HPV VACCINES  Aged Out     Discussed health benefits of physical activity, and encouraged her to engage in regular exercise appropriate for her age and condition.    Problem List Items Addressed This Visit   None   No follow-ups on file.     GMarrian Salvage CMA

## 2022-09-12 NOTE — Progress Notes (Unsigned)
Subjective:   [name] is a 71 y.o. female who presents for an Initial Medicare Annual Wellness Visit. I connected with  [name] on 09/13/22 by a video and audio enabled telemedicine application and verified that I am speaking with the correct person using two identifiers.  Patient Location: Home  Provider Location: Home Office  I discussed the limitations of evaluation and management by telemedicine. The patient expressed understanding and agreed to proceed.  Review of Systems    Deferred to PCP Cardiac Risk Factors include: advanced age (>72mn, >>53women);dyslipidemia;hypertension;obesity (BMI >30kg/m2)     Objective:    Today's Vitals   09/13/22 1453  PainSc: 1    There is no height or weight on file to calculate BMI.     09/13/2022    3:03 PM 08/07/2022    5:50 PM 09/20/2021    9:35 AM 07/30/2020    7:02 AM 04/25/2020    1:10 PM 06/03/2018    7:32 PM 08/20/2017    5:01 PM  Advanced Directives  Does Patient Have a Medical Advance Directive? Yes No Yes No Yes No No  Type of AParamed[place]of [place] will  [place] will  [place] will    Does patient want to make changes to medical advance directive? No - Patient declined    No - Patient declined    Copy of [place] Chart? No - copy requested    No - copy requested    Would patient like information on creating a medical advance directive?  No - Patient declined  No - Guardian declined  No - Patient declined No - Patient declined    Current Medications (verified) Outpatient Encounter Medications as of 09/13/2022  Medication Sig   calcium carbonate (OSCAL) 1500 (600 Ca) MG TABS tablet Take 600 mg of elemental calcium by mouth daily.   Cholecalciferol (VITAMIN D) 125 MCG (5000 UT) CAPS Take 125 mcg by mouth daily.   gabapentin (NEURONTIN) 100 MG capsule Take 1 capsule (100 mg total) by mouth 3 (three) times daily.    MULTIPLE VITAMIN tablet Take 1 tablet by mouth daily.   nabumetone (RELAFEN) 750 MG tablet TAKE 1 TABLET BY MOUTH TWICE A DAY AS NEEDED FOR ARTHRITIS   promethazine-dextromethorphan (PROMETHAZINE-DM) 6.25-15 MG/5ML syrup Take 5 mLs by mouth 4 (four) times daily as needed for cough.   rosuvastatin (CRESTOR) 10 MG tablet Take 1 tablet (10 mg total) by mouth daily.   vitamin B-12 (CYANOCOBALAMIN) 1000 MCG tablet Take 5,000 mcg by mouth daily.   azithromycin (ZITHROMAX) 250 MG tablet Take 2 tablets on day 1, then 1 tablet daily on days 2 through 5 (Patient not taking: Reported on 09/13/2022)   No facility-administered encounter medications on file as of 09/13/2022.    Allergies (verified) Amoxicillin and Penicillins   History: Past Medical History:  Diagnosis Date   Abnormal Pap smear of vagina 06/17/15   LGSIL   Allergic rhinitis, cause unspecified    Arthritis    ARTHRITIS, KNEE 02/10/2009   Qualifier: Diagnosis of  By: SNiel HummerMD, [name]    BRCA negative 03/25/13   [place]ervical cancer [place]   Cervical disc disease 02/11/2012   Chronic lower back pain    Degenerative arthritis of hip 02/11/2012   Diverticulosis 02/11/2012   GERD 08/10/2007   Qualifier: Diagnosis of  By: WSherlynn Mcdonald CMA, [name]     GERD (gastroesophageal reflux disease)  GERD (gastroesophageal reflux disease)    Hemorrhoids    HEMORRHOIDS-INTERNAL 07/21/2010   Qualifier: Diagnosis of  By: Kelly Holstein NP, [name]     Hiatal hernia    Hyperlipidemia 02/13/2012   Mitral valve prolapse    no cardiologist, mild   OAB (overactive bladder)    Obesity    OVERACTIVE BLADDER 08/10/2007   Qualifier: Diagnosis of  By: [name], [name], [name]     Pneumonia 2017   PONV (postoperative nausea and vomiting)    yrs ago unable to raise arms after female surgery, no recent problems   Recurrent UTI    Sleep apnea    uses cpap set on 2   SUI (stress urinary incontinence, female)    Urinary incontinence    VAGINITIS, ATROPHIC 11/11/2008    Qualifier: Diagnosis of  By: Kelly Hummer MD, [name]    Past Surgical History:  Procedure Laterality Date   bladder tack     x 3 or 4   BUNIONECTOMY Right 2007   CERVICAL DISCECTOMY  2002   fusion and plate C 3   CHOLECYSTECTOMY N/A 08/13/2013   Procedure: LAPAROSCOPIC CHOLECYSTECTOMY WITH INTRAOPERATIVE CHOLANGIOGRAM;  Surgeon: Kelly Medal, MD;  Location: [place];  Service: General;  Laterality: N/A;   CLOSED REDUCTION METATARSAL FRACTURE     rt side 06-03-18, left side 08-19-17   COLONOSCOPY  2018   conization of cervix  1982   with D&C   CYSTOCELE REPAIR  2003   rectocele repair   EYE SURGERY Bilateral yrs ago   lasik   ORIF ANKLE FRACTURE Right 08/20/2017   Procedure: OPEN REDUCTION INTERNAL FIXATION (ORIF) ANKLE FRACTURE;  Surgeon: Kelly Pel, MD;  Location: [facility];  Service: Orthopedics;  Laterality: Right;   SHOULDER ARTHROSCOPY Right 2004   frozen shoulder   TONSILLECTOMY  as child   adenoids removed   TRANSANAL HEMORRHOIDAL DEARTERIALIZATION N/A 07/30/2020   Procedure: TRANSANAL HEMORRHOIDAL DEARTERIALIZATION;  Surgeon: Kelly Ruff, MD;  Location: [place];  Service: General;  Laterality: N/A;   VAGINAL HYSTERECTOMY  1986   partial   Family History  Problem Relation Age of Onset   Breast cancer Mother        dx in her last 1's   Heart disease Sister    Breast cancer Sister        dx in her late 5's   Diabetes Brother    Heart disease Brother        triple bypass   Diabetes Maternal Aunt    Diabetes Maternal Uncle    Cancer Maternal Grandmother        uterine   Breast cancer Cousin    Thyroid cancer Other        2 cousins   Colon cancer Neg Hx    Esophageal cancer Neg Hx    Liver cancer Neg Hx    Pancreatic cancer Neg Hx    Rectal cancer Neg Hx    Stomach cancer Neg Hx    Social History   Socioeconomic History   Marital status: Married    Spouse name: [name]   Number of children: 1   Years of education: 12   Highest  education level: Not on file  Occupational History    Employer: [employer]  Tobacco Use   Smoking status: Never   Smokeless tobacco: Never  Vaping Use   Vaping Use: Never used  Substance and Sexual Activity   Alcohol use: Yes    Alcohol/week:  0.0 standard drinks of alcohol    Comment: occ [name]   Drug use: No   Sexual activity: Yes    Partners: Male    Birth control/protection: Post-menopausal    Comment: hysterectomy  Other Topics Concern   Not on file  Social History Narrative   Not on file   Social Determinants of Health   Financial Resource Strain: Low Risk  (09/13/2022)   Overall Financial Resource Strain (CARDIA)    Difficulty of Paying Living Expenses: Not hard at all  Food Insecurity: No Food Insecurity (09/13/2022)   Hunger Vital Sign    Worried About Running Out of Food in the Last Year: Never true    [place] in the Last Year: Never true  Transportation Needs: No Transportation Needs (09/13/2022)   PRAPARE - Hydrologist (Medical): No    Lack of Transportation (Non-Medical): No  Physical Activity: Inactive (09/13/2022)   Exercise Vital Sign    Days of Exercise per Week: 0 days    Minutes of Exercise per Session: 0 min  Stress: No Stress Concern Present (09/13/2022)   [place]    Feeling of Stress : Not at all  Social Connections: [place] (09/13/2022)   Social Connection and Isolation Panel [NHANES]    Frequency of Communication with Friends and Family: More than three times a week    Frequency of Social Gatherings with Friends and Family: More than three times a week    Attends Religious Services: More than 4 times per year    Active Member of [employer] or Organizations: Yes    Attends Music therapist: More than 4 times per year    Marital Status: Married    Tobacco Counseling Counseling given: Not Answered   Clinical  Intake:  Pre-visit preparation completed: Yes  Pain : 0-10 Pain Score: 1  Pain Type: Chronic pain Pain Location: Neck (neck pain that radiates to the hands) Pain Descriptors / Indicators: Radiating, Aching, Discomfort Pain Relieving Factors: medication and injections  Pain Relieving Factors: medication and injections  Nutritional Status: BMI > 30  Obese Nutritional Risks: None Diabetes: No  How often do you need to have someone help you when you read instructions, pamphlets, or other written materials from your doctor or pharmacy?: 1 - Never What is the last grade level you completed in school?: 12th  Diabetic?No  Interpreter Needed?: No  Information entered by :: Emelia Loron RN   Activities of Daily Living    09/13/2022    3:02 PM  In your present state of health, do you have any difficulty performing the following activities:  Hearing? 0  Vision? 0  Difficulty concentrating or making decisions? 0  Walking or climbing stairs? 0  Dressing or bathing? 0  Doing errands, shopping? 0  Preparing Food and eating ? N  Using the Toilet? N  In the past six months, have you accidently leaked urine? N  Do you have problems with loss of bowel control? N  Managing your Medications? N  Managing your Finances? N  Housekeeping or managing your Housekeeping? N    Patient Care Team: Biagio Borg, MD as PCP - General (Internal Medicine)  Indicate any recent Medical Services you may have received from other than [facility] providers in the past year (date may be approximate).     Assessment:   This is a routine wellness examination for [facility].  Hearing/Vision screen  No results found.  Dietary issues and exercise activities discussed: Current Exercise Habits: The patient does not participate in regular exercise at present, Exercise limited by: orthopedic condition(s)   Goals Addressed             This Visit's Progress    Patient Stated       Increase my physical activity by  joining the gym and starting to walk routinely      Depression Screen    09/13/2022    3:15 PM 09/08/2022    8:10 AM 08/10/2022   10:06 AM 08/09/2021   10:49 AM 08/09/2021   10:07 AM 08/06/2020   11:31 AM 08/06/2020   10:41 AM  PHQ 2/9 Scores  PHQ - 2 Score 0 0  0 0 0 0  PHQ- 9 Score  0       Exception Documentation   Patient refusal        Fall Risk    09/13/2022    3:04 PM 09/08/2022    8:09 AM 08/10/2022   10:06 AM 08/01/2022    3:22 PM 08/09/2021   10:49 AM  Fall Risk   Falls in the past year? 0 0 0 0 0  Number falls in past yr: 0 0  0 0  Injury with Fall? 0 0  0 0  Risk for fall due to : No Fall Risks No Fall Risks No Fall Risks No Fall Risks   Follow up Falls evaluation completed Falls evaluation completed Follow up appointment Falls evaluation completed     [place]:  Any stairs in or around the home? Yes  If so, are there any without handrails? Yes  Home free of loose throw rugs in walkways, pet beds, electrical cords, etc? Yes  Adequate lighting in your home to reduce risk of falls? Yes   ASSISTIVE DEVICES UTILIZED TO PREVENT FALLS:  Life alert? No  Use of a cane, walker or w/c? No  Grab bars in the bathroom? Yes  Shower chair or bench in shower? Yes  Elevated toilet seat or a handicapped toilet? No   Cognitive Function:        09/13/2022    3:05 PM  6CIT Screen  What Year? 0 points  What month? 0 points  What time? 0 points  Count back from 20 0 points  Months in reverse 0 points  Repeat phrase 0 points  Total Score 0 points    Immunizations Immunization History  Administered Date(s) Administered   Fluad Quad(high Dose 65+) 08/02/2019, 08/06/2020, 08/09/2021, 08/10/2022   Influenza Split 09/12/2012   Influenza Whole 10/03/2007, 08/16/2010   Influenza,inj,Quad PF,6+ Mos 08/07/2013, 07/18/2014, 07/21/2015, 07/21/2016, 07/28/2017, 07/31/2018   PFIZER(Purple Top)SARS-COV-2 Vaccination 12/13/2019, 01/07/2020,  10/12/2020   Pneumococcal Conjugate-13 08/19/2019   Td 09/08/2003   Tdap 03/20/2014    TDAP status: Up to date  Flu Vaccine status: Up to date  Pneumococcal vaccine status: Due, Education has been provided regarding the importance of this vaccine. Advised may receive this vaccine at local pharmacy or Health Dept. Aware to provide a copy of the vaccination record if obtained from local pharmacy or Health Dept. Verbalized acceptance and understanding.  Covid-19 vaccine status: Information provided on how to obtain vaccines.   Qualifies for Shingles Vaccine? Yes   Zostavax completed No   Shingrix Completed?: No.    Education has been provided regarding the importance of this vaccine. Patient has been advised to call insurance company to determine out  of pocket expense if they have not yet received this vaccine. Advised may also receive vaccine at local pharmacy or Health Dept. Verbalized acceptance and understanding.  Screening Tests Health Maintenance  Topic Date Due   COVID-19 Vaccine (4 - Pfizer series) 12/07/2020   Zoster Vaccines- Shingrix (1 of 2) 11/10/2022 (Originally 10/09/2003)   Pneumonia Vaccine 70+ Years old (2 - PPSV23 or PCV20) 08/11/2023 (Originally 08/18/2020)   Medicare Annual Wellness (AWV)  09/14/2023   TETANUS/TDAP  03/20/2024   MAMMOGRAM  07/26/2024   COLONOSCOPY (Pts 45-19yr Insurance coverage will need to be confirmed)  07/12/2027   INFLUENZA VACCINE  Completed   DEXA SCAN  Completed   Hepatitis C Screening  Completed   HPV VACCINES  Aged Out    Health Maintenance  Health Maintenance Due  Topic Date Due   COVID-19 Vaccine (4 - Pfizer series) 12/07/2020    Colorectal cancer screening: Type of screening: Colonoscopy. Completed 07/11/17. Repeat every 10 years  Mammogram status: Completed 07/26/22. Repeat every year  Bone Density status: Completed 08/23/22. Results reflect: Bone density results: OSTEOPENIA. Repeat every 2 years.  Lung Cancer Screening:  (Low Dose CT Chest recommended if Age 69-80years, 30 pack-year currently smoking OR have quit w/in 15years.) does not qualify.   Additional Screening:  Hepatitis C Screening: does qualify; Completed 07/13/16  Vision Screening: Recommended annual ophthalmology exams for early detection of glaucoma and other disorders of the eye. Is the patient up to date with their annual eye exam?  No  Who is the provider or what is the name of the office in which the patient attends annual eye exams? Dr. MSabra HeckIf pt is not established with a provider, would they like to be referred to a provider to establish care?  N/A .   Dental Screening: Recommended annual dental exams for proper oral hygiene  Community Resource Referral / Chronic Care Management: CRR required this visit?  No   CCM required this visit?  No      Plan:     I have personally reviewed and noted the following in the patient's chart:   Medical and social history Use of alcohol, tobacco or illicit drugs  Current medications and supplements including opioid prescriptions. Patient is not currently taking opioid prescriptions. Functional ability and status Nutritional status Physical activity Advanced directives List of other physicians Hospitalizations, surgeries, and ER visits in previous 12 months Vitals Screenings to include cognitive, depression, and falls Referrals and appointments  In addition, I have reviewed and discussed with patient certain preventive protocols, quality metrics, and best practice recommendations. A written personalized care plan for preventive services as well as general preventive health recommendations were provided to patient.     JMichiel Cowboy RN   09/13/2022   Nurse Notes:  Ms. CNuckles, Thank you for taking time to come for your Medicare Wellness Visit. I appreciate your ongoing commitment to your health goals. Please review the following plan we discussed and let me know if I can assist you in the  future.   These are the goals we discussed:  Goals      Patient Stated     Increase my physical activity by joining the gym and starting to walk routinely        This is a list of the screening recommended for you and due dates:  Health Maintenance  Topic Date Due   COVID-19 Vaccine (4 - Pfizer series) 12/07/2020   Zoster (Shingles) Vaccine (1 of 2) 11/10/2022*  Pneumonia Vaccine (2 - PPSV23 or PCV20) 08/11/2023*   Medicare Annual Wellness Visit  09/14/2023   Tetanus Vaccine  03/20/2024   Mammogram  07/26/2024   Colon Cancer Screening  07/12/2027   Flu Shot  Completed   DEXA scan (bone density measurement)  Completed   Hepatitis C Screening: USPSTF Recommendation to screen - Ages 18-79 yo.  Completed   HPV Vaccine  Aged Out  *Topic was postponed. The date shown is not the original due date.

## 2022-09-12 NOTE — Patient Instructions (Signed)

## 2022-09-13 ENCOUNTER — Ambulatory Visit (INDEPENDENT_AMBULATORY_CARE_PROVIDER_SITE_OTHER): Payer: Medicare Other | Admitting: *Deleted

## 2022-09-13 DIAGNOSIS — Z0000 Encounter for general adult medical examination without abnormal findings: Secondary | ICD-10-CM

## 2022-09-15 DIAGNOSIS — L728 Other follicular cysts of the skin and subcutaneous tissue: Secondary | ICD-10-CM | POA: Diagnosis not present

## 2022-09-15 DIAGNOSIS — L814 Other melanin hyperpigmentation: Secondary | ICD-10-CM | POA: Diagnosis not present

## 2022-09-15 DIAGNOSIS — L821 Other seborrheic keratosis: Secondary | ICD-10-CM | POA: Diagnosis not present

## 2022-09-15 DIAGNOSIS — D2372 Other benign neoplasm of skin of left lower limb, including hip: Secondary | ICD-10-CM | POA: Diagnosis not present

## 2022-09-15 DIAGNOSIS — L304 Erythema intertrigo: Secondary | ICD-10-CM | POA: Diagnosis not present

## 2022-09-19 ENCOUNTER — Telehealth: Payer: Self-pay | Admitting: Internal Medicine

## 2022-09-19 DIAGNOSIS — J069 Acute upper respiratory infection, unspecified: Secondary | ICD-10-CM

## 2022-09-19 DIAGNOSIS — R059 Cough, unspecified: Secondary | ICD-10-CM

## 2022-09-19 NOTE — Telephone Encounter (Signed)
Patient called and said she was told to call back and schedule chest xray if she wasn't doing any better. She was seen by Erlene Senters on 09/08/22. Is this ok to schedule? An order hasn't been put in yet. Call back is [phone]

## 2022-09-20 ENCOUNTER — Ambulatory Visit (INDEPENDENT_AMBULATORY_CARE_PROVIDER_SITE_OTHER): Payer: Medicare Other

## 2022-09-20 DIAGNOSIS — J069 Acute upper respiratory infection, unspecified: Secondary | ICD-10-CM | POA: Diagnosis not present

## 2022-09-20 DIAGNOSIS — R059 Cough, unspecified: Secondary | ICD-10-CM | POA: Diagnosis not present

## 2022-09-20 NOTE — Telephone Encounter (Signed)
Patient informed about orders for xray

## 2022-09-20 NOTE — Telephone Encounter (Signed)
Please advise for chest xray, LOV 11/2

## 2022-09-20 NOTE — Telephone Encounter (Signed)
Ok xray ordered

## 2022-09-22 ENCOUNTER — Encounter: Payer: Self-pay | Admitting: Internal Medicine

## 2022-09-22 DIAGNOSIS — R059 Cough, unspecified: Secondary | ICD-10-CM

## 2022-09-23 NOTE — Telephone Encounter (Signed)
Patient would like to proceed with referral to pulmonary. She is also inquiring about a previous referral to cardiology and says she never received a call but I do not see where one was placed previously.

## 2022-09-26 DIAGNOSIS — M5412 Radiculopathy, cervical region: Secondary | ICD-10-CM | POA: Diagnosis not present

## 2022-09-26 DIAGNOSIS — Z6835 Body mass index (BMI) 35.0-35.9, adult: Secondary | ICD-10-CM | POA: Diagnosis not present

## 2022-09-26 NOTE — Telephone Encounter (Signed)
Pt has follow up questions, thanks

## 2022-09-26 NOTE — Telephone Encounter (Signed)
Patient saw Jeralyn Ruths on 11/2, please advise if there is anything I can do?

## 2022-09-27 ENCOUNTER — Other Ambulatory Visit: Payer: Self-pay | Admitting: Nurse Practitioner

## 2022-09-27 DIAGNOSIS — R079 Chest pain, unspecified: Secondary | ICD-10-CM

## 2022-09-28 NOTE — Telephone Encounter (Signed)
Left message for a call back from patient

## 2022-10-03 ENCOUNTER — Ambulatory Visit (INDEPENDENT_AMBULATORY_CARE_PROVIDER_SITE_OTHER): Payer: Medicare Other | Admitting: Internal Medicine

## 2022-10-03 ENCOUNTER — Telehealth: Payer: Self-pay

## 2022-10-03 ENCOUNTER — Encounter: Payer: Self-pay | Admitting: Internal Medicine

## 2022-10-03 VITALS — BP 120/66 | HR 78 | Temp 98.7°F | Ht 67.0 in | Wt 220.0 lb

## 2022-10-03 DIAGNOSIS — J309 Allergic rhinitis, unspecified: Secondary | ICD-10-CM | POA: Diagnosis not present

## 2022-10-03 DIAGNOSIS — J4521 Mild intermittent asthma with (acute) exacerbation: Secondary | ICD-10-CM | POA: Diagnosis not present

## 2022-10-03 DIAGNOSIS — R7302 Impaired glucose tolerance (oral): Secondary | ICD-10-CM | POA: Diagnosis not present

## 2022-10-03 DIAGNOSIS — E559 Vitamin D deficiency, unspecified: Secondary | ICD-10-CM | POA: Diagnosis not present

## 2022-10-03 DIAGNOSIS — J45901 Unspecified asthma with (acute) exacerbation: Secondary | ICD-10-CM | POA: Insufficient documentation

## 2022-10-03 DIAGNOSIS — J4531 Mild persistent asthma with (acute) exacerbation: Secondary | ICD-10-CM | POA: Insufficient documentation

## 2022-10-03 MED ORDER — PREDNISONE 10 MG PO TABS: ORAL_TABLET | ORAL | 0 refills | Status: DC

## 2022-10-03 MED ORDER — METHYLPREDNISOLONE ACETATE 80 MG/ML IJ SUSP
80.0000 mg | Freq: Once | INTRAMUSCULAR | Status: AC
  Administered 2022-10-03: 80 mg via INTRAMUSCULAR

## 2022-10-03 MED ORDER — ALBUTEROL SULFATE HFA 108 (90 BASE) MCG/ACT IN AERS: 2.0000 | INHALATION_SPRAY | Freq: Four times a day (QID) | RESPIRATORY_TRACT | 2 refills | Status: DC | PRN

## 2022-10-03 MED ORDER — NABUMETONE 750 MG PO TABS: ORAL_TABLET | ORAL | 2 refills | Status: DC

## 2022-10-03 MED ORDER — TRIAMCINOLONE ACETONIDE 55 MCG/ACT NA AERO: 2.0000 | INHALATION_SPRAY | Freq: Every day | NASAL | 12 refills | Status: DC

## 2022-10-03 MED ORDER — HYDROCODONE BIT-HOMATROP MBR 5-1.5 MG/5ML PO SOLN: 5.0000 mL | Freq: Four times a day (QID) | ORAL | 0 refills | Status: AC | PRN

## 2022-10-03 NOTE — Assessment & Plan Note (Signed)
Last vitamin D Lab Results  Component Value Date   VD25OH 58.37 08/10/2022   Stable, cont oral replacement

## 2022-10-03 NOTE — Telephone Encounter (Signed)
Ok done erx 

## 2022-10-03 NOTE — Patient Instructions (Addendum)
You had the steroid shot today  Please take all new medication as prescribed - the prednisone, and the inhaler (and cough medicine if needed)  Please continue all other medications as before, and refills have been done if requested.  Please have the pharmacy call with any other refills you may need.  Please continue your efforts at being more active, low cholesterol diet, and weight control.  Please keep your appointments with your specialists as you may have planned - pulmonary, and cardiology

## 2022-10-03 NOTE — Telephone Encounter (Signed)
Patient seen in office today and has requested a refill for Nabumetone.

## 2022-10-03 NOTE — Progress Notes (Signed)
Patient ID: [name], female   DOB: 07-13-1951, 71 y.o.   MRN: [id number]        Chief Complaint: follow up cough and wheezing, low vit d, hyperglycemia       HPI:  [name] is a 71 y.o. female here with c/o 7 wks ongoing difficulty with prod cough white phlegm, but associated with some throat soreness and wheezing, sob. .  Pt denies chest pain, increased sob or orthopnea, PND, increased LE swelling, palpitations, dizziness or syncope.   Does have several wks ongoing nasal allergy symptoms with clearish congestion, itch and sneezing, without fever, pain, ST, cough, swelling or wheezing.   Pt denies polydipsia, polyuria, or new focal neuro s/s.   Taking VIT D       Wt Readings from Last 3 Encounters:  10/03/22 220 lb (99.8 kg)  09/08/22 220 lb 4 oz (99.9 kg)  08/10/22 227 lb (103 kg)   BP Readings from Last 3 Encounters:  10/03/22 120/66  09/08/22 126/82  08/10/22 124/72         Past Medical History:  Diagnosis Date   Abnormal Pap smear of vagina 06/17/15   LGSIL   Allergic rhinitis, cause unspecified    Arthritis    ARTHRITIS, KNEE 02/10/2009   Qualifier: Diagnosis of  By: Niel Hummer MD, Willie R    BRCA negative 03/25/13   Cervical cancer ([place]) 1984   Cervical disc disease 02/11/2012   Chronic lower back pain    Degenerative arthritis of hip 02/11/2012   Diverticulosis 02/11/2012   GERD 08/10/2007   Qualifier: Diagnosis of  By: Sherlynn Stalls, CMA, Cindy     GERD (gastroesophageal reflux disease)    GERD (gastroesophageal reflux disease)    Hemorrhoids    HEMORRHOIDS-INTERNAL 07/21/2010   Qualifier: Diagnosis of  By: Chester Holstein NP, Paula     Hiatal hernia    Hyperlipidemia 02/13/2012   Mitral valve prolapse    no cardiologist, mild   OAB (overactive bladder)    Obesity    OVERACTIVE BLADDER 08/10/2007   Qualifier: Diagnosis of  By: Sherlynn Stalls, [place], [place]     Pneumonia 2017   PONV (postoperative nausea and vomiting)    yrs ago unable to raise arms after female surgery, no recent problems    Recurrent UTI    Sleep apnea    uses cpap set on 2   SUI (stress urinary incontinence, female)    Urinary incontinence    VAGINITIS, ATROPHIC 11/11/2008   Qualifier: Diagnosis of  By: Niel Hummer MD, Lorinda Creed    Past Surgical History:  Procedure Laterality Date   bladder tack     x 3 or 4   BUNIONECTOMY Right 2007   CERVICAL DISCECTOMY  2002   fusion and plate C 3   CHOLECYSTECTOMY N/A 08/13/2013   Procedure: LAPAROSCOPIC CHOLECYSTECTOMY WITH INTRAOPERATIVE CHOLANGIOGRAM;  Surgeon: Shann Medal, MD;  Location: [facility];  Service: General;  Laterality: N/A;   CLOSED REDUCTION METATARSAL FRACTURE     rt side 06-03-18, left side 08-19-17   COLONOSCOPY  2018   conization of cervix  1982   with D&C   CYSTOCELE REPAIR  2003   rectocele repair   EYE SURGERY Bilateral yrs ago   lasik   ORIF ANKLE FRACTURE Right 08/20/2017   Procedure: OPEN REDUCTION INTERNAL FIXATION (ORIF) ANKLE FRACTURE;  Surgeon: Meredith Pel, MD;  Location: [facility];  Service: Orthopedics;  Laterality: Right;   SHOULDER ARTHROSCOPY Right 2004  frozen shoulder   TONSILLECTOMY  as child   adenoids removed   TRANSANAL HEMORRHOIDAL DEARTERIALIZATION N/A 07/30/2020   Procedure: TRANSANAL HEMORRHOIDAL DEARTERIALIZATION;  Surgeon: Leighton Ruff, MD;  Location[facility];  Service: General;  Laterality: N/A;   [place]   partial    reports that she has never smoked. She has never used smokeless tobacco. She reports current alcohol use. She reports that she does not use drugs. family history includes Breast cancer in her cousin, mother, and sister; Cancer in her maternal grandmother; Diabetes in her brother, maternal aunt, and maternal uncle; Heart disease in her brother and sister; Thyroid cancer in an other family member. Allergies  Allergen Reactions   Amoxicillin     REACTION: unspecified   Penicillins Other (See Comments)    Unknown reaction, mother had reaction almost died    Current Outpatient Medications on File Prior to Visit  Medication Sig Dispense Refill   calcium carbonate (OSCAL) 1500 (600 Ca) MG TABS tablet Take 600 mg of elemental calcium by mouth daily.     Cholecalciferol (VITAMIN D) 125 MCG (5000 UT) CAPS Take 125 mcg by mouth daily.     gabapentin (NEURONTIN) 100 MG capsule Take 1 capsule (100 mg total) by mouth 3 (three) times daily. 90 capsule 3   MULTIPLE VITAMIN tablet Take 1 tablet by mouth daily.     promethazine-dextromethorphan (PROMETHAZINE-DM) 6.25-15 MG/5ML syrup Take 5 mLs by mouth 4 (four) times daily as needed for cough. 118 mL 0   rosuvastatin (CRESTOR) 10 MG tablet Take 1 tablet (10 mg total) by mouth daily. 90 tablet 3   vitamin B-12 (CYANOCOBALAMIN) 1000 MCG tablet Take 5,000 mcg by mouth daily.     No current facility-administered medications on file prior to visit.        ROS:  All others reviewed and negative.  Objective        PE:  BP 120/66 (BP Location: Left Arm, Patient Position: Sitting, Cuff Size: Large)   Pulse 78   Temp 98.7 F (37.1 C) (Oral)   Ht _0  (1.702 m)   Wt 220 lb (99.8 kg)   SpO2 91%   BMI 34.46 kg/m                 Constitutional: Pt appears in NAD               HENT: Head: NCAT.                Right Ear: External ear normal.                 Left Ear: External ear normal. Bilat tm's with mild erythema.  Max sinus areas non tender.  Pharynx with mild erythema, no exudate               Eyes: . Pupils are equal, round, and reactive to light. Conjunctivae and EOM are normal               Nose: without d/c or deformity               Neck: Neck supple. Gross normal ROM               Cardiovascular: Normal rate and regular rhythm.                 Pulmonary/Chest: Effort normal and breath sounds without rales or wheezing.  Neurological: Pt is alert. At baseline orientation, motor grossly intact               Skin: Skin is warm. No rashes, no other new lesions, LE edema -  none               Psychiatric: Pt behavior is normal without agitation   Micro: none  Cardiac tracings I have personally interpreted today:  none  Pertinent Radiological findings (summarize): none   Lab Results  Component Value Date   WBC 10.1 08/10/2022   HGB 13.5 08/10/2022   HCT 39.6 08/10/2022   PLT 246.0 08/10/2022   GLUCOSE 108 (H) 08/10/2022   CHOL 178 08/10/2022   TRIG 122.0 08/10/2022   HDL 48.60 08/10/2022   LDLDIRECT 135.1 05/08/2012   LDLCALC 105 (H) 08/10/2022   ALT 16 08/10/2022   AST 17 08/10/2022   NA 141 08/10/2022   K 3.6 08/10/2022   CL 106 08/10/2022   CREATININE 0.84 08/10/2022   BUN 19 08/10/2022   CO2 27 08/10/2022   TSH 1.56 08/10/2022   HGBA1C 6.0 08/10/2022   Assessment/Plan:  LEI DOWER is a 71 y.o. White or Caucasian [1] female with  has a past medical history of Abnormal Pap smear of vagina (06/17/15), Allergic rhinitis, cause unspecified, Arthritis, ARTHRITIS, KNEE (02/10/2009), BRCA negative (03/25/13), Cervical cancer ([place]) (1984), Cervical disc disease (02/11/2012), Chronic lower back pain, Degenerative arthritis of hip (02/11/2012), Diverticulosis (02/11/2012), GERD (08/10/2007), GERD (gastroesophageal reflux disease), GERD (gastroesophageal reflux disease), Hemorrhoids, HEMORRHOIDS-INTERNAL (07/21/2010), Hiatal hernia, Hyperlipidemia (02/13/2012), Mitral valve prolapse, OAB (overactive bladder), Obesity, OVERACTIVE BLADDER (08/10/2007), Pneumonia (2017), PONV (postoperative nausea and vomiting), Recurrent UTI, Sleep apnea, SUI (stress urinary incontinence, female), Urinary incontinence, and VAGINITIS, ATROPHIC (11/11/2008).  Vitamin D deficiency Last vitamin D Lab Results  Component Value Date   VD25OH 58.37 08/10/2022   Stable, cont oral replacement   Impaired glucose tolerance Lab Results  Component Value Date   HGBA1C 6.0 08/10/2022   Stable, pt to continue current medical treatment  - diet, wt control, excercise   Allergic rhinitis Mild to  mod seasonal flare, for depomedrol IM 80 mg qd, prednisone taper,  to f/u any worsening symptoms or concerns   Asthma exacerbation Mild, for depomedrol IM 80 mg qd, prednisone taper, inhaler prn,  to f/u any worsening symptoms or concerns and pulmonary as planned  Followup: Return in about 10 months (around 08/15/2023), or if symptoms worsen or fail to improve.  Cathlean Cower, MD 10/03/2022 1:14 PM [facility]

## 2022-10-03 NOTE — Assessment & Plan Note (Signed)
Mild to mod seasonal flare, for depomedrol IM 80 mg qd, prednisone taper,  to f/u any worsening symptoms or concerns

## 2022-10-03 NOTE — Assessment & Plan Note (Signed)
Lab Results  Component Value Date   HGBA1C 6.0 08/10/2022   Stable, pt to continue current medical treatment  - diet, wt control, excercise

## 2022-10-03 NOTE — Assessment & Plan Note (Addendum)
Mild, for depomedrol IM 80 mg qd, prednisone taper, inhaler prn,  to f/u any worsening symptoms or concerns and pulmonary as planned

## 2022-10-05 NOTE — Telephone Encounter (Signed)
Patient received meds from pharmacy.

## 2022-10-11 ENCOUNTER — Ambulatory Visit (INDEPENDENT_AMBULATORY_CARE_PROVIDER_SITE_OTHER): Payer: Medicare Other | Admitting: Pulmonary Disease

## 2022-10-11 ENCOUNTER — Encounter (HOSPITAL_BASED_OUTPATIENT_CLINIC_OR_DEPARTMENT_OTHER): Payer: Self-pay | Admitting: Pulmonary Disease

## 2022-10-11 VITALS — BP 118/72 | HR 87 | Temp 97.6°F | Ht 67.0 in | Wt 222.4 lb

## 2022-10-11 DIAGNOSIS — J4531 Mild persistent asthma with (acute) exacerbation: Secondary | ICD-10-CM

## 2022-10-11 DIAGNOSIS — G4733 Obstructive sleep apnea (adult) (pediatric): Secondary | ICD-10-CM

## 2022-10-11 LAB — PULMONARY FUNCTION TEST
FEF 25-75 Post: 3.66 L/sec
FEF 25-75 Pre: 2.99 L/sec
FEF2575-%Change-Post: 22 %
FEF2575-%Pred-Post: 172 %
FEF2575-%Pred-Pre: 140 %
FEV1-%Change-Post: 5 %
FEV1-%Pred-Post: 98 %
FEV1-%Pred-Pre: 93 %
FEV1-Post: 2.54 L
FEV1-Pre: 2.42 L
FEV1FVC-%Change-Post: 6 %
FEV1FVC-%Pred-Pre: 110 %
FEV6-%Change-Post: 0 %
FEV6-%Pred-Post: 86 %
FEV6-%Pred-Pre: 87 %
FEV6-Post: 2.82 L
FEV6-Pre: 2.85 L
FEV6FVC-%Change-Post: 0 %
FEV6FVC-%Pred-Post: 104 %
FEV6FVC-%Pred-Pre: 104 %
FVC-%Change-Post: -1 %
FVC-%Pred-Post: 83 %
FVC-%Pred-Pre: 84 %
FVC-Post: 2.83 L
FVC-Pre: 2.87 L
Post FEV1/FVC ratio: 90 %
Post FEV6/FVC ratio: 100 %
Pre FEV1/FVC ratio: 84 %
Pre FEV6/FVC Ratio: 100 %

## 2022-10-11 NOTE — Assessment & Plan Note (Signed)
You may have reactive airways related to seasonal allergies We will see if symptoms come back after completing course of steroids  X spirometry pre and post today  If persistent symptoms, we will consider adding steroid/LABA inhaler. And check Fino and RAST

## 2022-10-11 NOTE — Patient Instructions (Signed)
You may have reactive airways related to seasonal allergies We will see if symptoms come back after completing course of steroids  X spirometry pre and post today  X schedule home sleep test  X prescription for new nasal pillows Please get back on CPAP machine, you can discontinue humidity  If unable to tolerate we will consider dental appliance

## 2022-10-11 NOTE — Progress Notes (Signed)
Pre/Post Spirometry Performed Today. 

## 2022-10-11 NOTE — Progress Notes (Signed)
Subjective:    Patient ID: [name], female    DOB: 1951-05-05, 71 y.o.   MRN: [id number]  HPI  Chief Complaint  Patient presents with   Consult    Pt states she had a productive cough x 7 weeks. Pt states she went to her PCP last week and received Prednisone and a steroid shot. Pt states she has felt better x 1 week.    71 year old never smoker presents for evaluation of cough. We have seen her in the past, last 2019 for OSA, maintained on auto CPAP. She was using nitrofurantoin for about 7-year due to recurrent UTIs and we advised her to stop taking  She reports onset of cough in the second week of October associated with white phlegm, no dyspnea on exertion.  She reports some postnasal drainage and tickle in her throat, occasional wheezing.  She was given albuterol MDI without relief. Chest x-ray was clear in October and repeated 11/15 did not show any infiltrates, persistent elevated right hemidiaphragm. She saw PCP on 11/27 and was given Depo Medrol and prednisone course which she is now finishing.  This improved her symptoms and cough is almost resolved.  Review of PCP notes shows that presumptive diagnosis was asthma flare and allergic rhinitis.  She reports persistent GERD symptoms for which she takes OTC medication.  OSA was diagnosed in 2018.  She has lost 10 pounds since then.  She was placed on CPAP with improvement in symptoms of daytime somnolence and snoring but during the pandemic she stopped using her machine.  She had persistent sinus infections in spite of trying nasal pillows.  She generally sleeps on her side.  She felt more rested while using the machine  Significant tests/ events reviewed  10/2017 HST - 232 lbs-  moderate OSA with 20/h , esp supine >> auto CPAP   09/2019 CT coronaries minimal scarring right lower lobe  Past Medical History:  Diagnosis Date   Abnormal Pap smear of vagina 06/17/15   LGSIL   Allergic rhinitis, cause unspecified    Arthritis     ARTHRITIS, KNEE 02/10/2009   Qualifier: Diagnosis of  By: Niel Hummer MD, Willie R    BRCA negative 03/25/13   Cervical cancer ([place]) 1984   Cervical disc disease 02/11/2012   Chronic lower back pain    Degenerative arthritis of hip 02/11/2012   Diverticulosis 02/11/2012   GERD 08/10/2007   Qualifier: Diagnosis of  By: Sherlynn Stalls, CMA, Cindy     GERD (gastroesophageal reflux disease)    GERD (gastroesophageal reflux disease)    Hemorrhoids    HEMORRHOIDS-INTERNAL 07/21/2010   Qualifier: Diagnosis of  By: Chester Holstein NP, Paula     Hiatal hernia    Hyperlipidemia 02/13/2012   Mitral valve prolapse    no cardiologist, mild   OAB (overactive bladder)    Obesity    OVERACTIVE BLADDER 08/10/2007   Qualifier: Diagnosis of  By: Sherlynn Stalls, [place], Cindy     Pneumonia 2017   PONV (postoperative nausea and vomiting)    yrs ago unable to raise arms after female surgery, no recent problems   Recurrent UTI    Sleep apnea    uses cpap set on 2   SUI (stress urinary incontinence, female)    Urinary incontinence    VAGINITIS, ATROPHIC 11/11/2008   Qualifier: Diagnosis of  By: Niel Hummer MD, Lorinda Creed     Past Surgical History:  Procedure Laterality Date   bladder tack  x 3 or 4   BUNIONECTOMY Right 2007   CERVICAL DISCECTOMY  2002   fusion and plate C 3   CHOLECYSTECTOMY N/A 08/13/2013   Procedure: LAPAROSCOPIC CHOLECYSTECTOMY WITH INTRAOPERATIVE CHOLANGIOGRAM;  Surgeon: Shann Medal, MD;  Location: [place];  Service: General;  Laterality: N/A;   CLOSED REDUCTION METATARSAL FRACTURE     rt side 06-03-18, left side 08-19-17   COLONOSCOPY  2018   conization of cervix  1982   with D&C   CYSTOCELE REPAIR  2003   rectocele repair   EYE SURGERY Bilateral yrs ago   lasik   ORIF ANKLE FRACTURE Right 08/20/2017   Procedure: OPEN REDUCTION INTERNAL FIXATION (ORIF) ANKLE FRACTURE;  Surgeon: Meredith Pel, MD;  Location: [facility];  Service: Orthopedics;  Laterality: Right;   SHOULDER ARTHROSCOPY Right 2004    frozen shoulder   TONSILLECTOMY  as child   adenoids removed   TRANSANAL HEMORRHOIDAL DEARTERIALIZATION N/A 07/30/2020   Procedure: TRANSANAL HEMORRHOIDAL DEARTERIALIZATION;  Surgeon: Leighton Ruff, MD;  Location: [place];  Service: General;  Laterality: N/A;   VAGINAL HYSTERECTOMY  1986   partial    Allergies  Allergen Reactions   Amoxicillin     REACTION: unspecified   Penicillins Other (See Comments)    Unknown reaction, mother had reaction almost died    Social History   Socioeconomic History   Marital status: Married    Spouse name: Jeneen Rinks   Number of children: 1   Years of education: 12   Highest education level: Not on file  Occupational History    Employer: [employer]  Tobacco Use   Smoking status: Never    Passive exposure: Past   Smokeless tobacco: Never  Vaping Use   Vaping Use: Never used  Substance and Sexual Activity   Alcohol use: Yes    Alcohol/week: 0.0 standard drinks of alcohol    Comment: occ wine   Drug use: No   Sexual activity: Yes    Partners: Male    Birth control/protection: Post-menopausal    Comment: hysterectomy  Other Topics Concern   Not on file  Social History Narrative   Not on file   Social Determinants of Health   Financial Resource Strain: Low Risk  (09/13/2022)   Overall Financial Resource Strain (CARDIA)    Difficulty of Paying Living Expenses: Not hard at all  Food Insecurity: No Food Insecurity (09/13/2022)   Hunger Vital Sign    Worried About Running Out of Food in the Last Year: Never true    [place] in the Last Year: Never true  Transportation Needs: No Transportation Needs (09/13/2022)   PRAPARE - Hydrologist (Medical): No    Lack of Transportation (Non-Medical): No  Physical Activity: Inactive (09/13/2022)   Exercise Vital Sign    Days of Exercise per Week: 0 days    Minutes of Exercise per Session: 0 min  Stress: No Stress Concern Present (09/13/2022)    [place]    Feeling of Stress : Not at all  Social Connections: [place] (09/13/2022)   Social Connection and Isolation Panel [NHANES]    Frequency of Communication with Friends and Family: More than three times a week    Frequency of Social Gatherings with Friends and Family: More than three times a week    Attends Religious Services: More than 4 times per year    Active Member of  Clubs or Organizations: Yes    Attends Archivist Meetings: More than 4 times per year    Marital Status: Married  Human resources officer Violence: Not At Risk (09/13/2022)   Humiliation, Afraid, Rape, and Kick questionnaire    Fear of Current or Ex-Partner: No    Emotionally Abused: No    Physically Abused: No    Sexually Abused: No    Family History  Problem Relation Age of Onset   Breast cancer Mother        dx in her last 87's   Heart disease Sister    Breast cancer Sister        dx in her late 53's   Diabetes Brother    Heart disease Brother        triple bypass   Diabetes Maternal Aunt    Diabetes Maternal Uncle    Cancer Maternal Grandmother        uterine   Breast cancer Cousin    Thyroid cancer Other        2 cousins   Colon cancer Neg Hx    Esophageal cancer Neg Hx    Liver cancer Neg Hx    Pancreatic cancer Neg Hx    Rectal cancer Neg Hx    Stomach cancer Neg Hx     Review of Systems Positive for acid heartburn Loss of appetite Weight loss about 8 pounds Joint stiffness   Constitutional: negative for anorexia, fevers and sweats  Eyes: negative for irritation, redness and visual disturbance  Ears, nose, mouth, throat, and face: negative for earaches, epistaxis, nasal congestion and sore throat  Cardiovascular: negative for chest pain, dyspnea, lower extremity edema, orthopnea, palpitations and syncope  Gastrointestinal: negative for abdominal pain, constipation, diarrhea, melena, nausea and  vomiting  Genitourinary:negative for dysuria, frequency and hematuria  Hematologic/lymphatic: negative for bleeding, easy bruising and lymphadenopathy  Musculoskeletal:negative for arthralgias, muscle weakness  Neurological: negative for coordination problems, gait problems, headaches and weakness  Endocrine: negative for diabetic symptoms including polydipsia, polyuria and weight loss     Objective:   Physical Exam  [place], well-nourished, in no distress, normal affect ENT - no pallor,icterus, no post nasal drip Neck: No JVD, no thyromegaly, no carotid bruits Lungs: no use of accessory muscles, no dullness to percussion, clear without rales or rhonchi  Cardiovascular: Rhythm regular, heart sounds  normal, no murmurs or gallops, no peripheral edema Abdomen: soft and non-tender, no hepatosplenomegaly, BS normal. Musculoskeletal: No deformities, no cyanosis or clubbing Neuro:  alert, non focal       Assessment & Plan:

## 2022-10-11 NOTE — Assessment & Plan Note (Signed)
X schedule home sleep test , it is possible that with her weight loss, degree of OSA has improved  X prescription for new nasal pillows Please get back on CPAP machine, you can discontinue humidity due to recurrent sinus infections  If unable to tolerate we will consider dental appliance

## 2022-10-11 NOTE — Patient Instructions (Signed)
Pre/Post Spirometry Performed Today. 

## 2022-10-12 ENCOUNTER — Telehealth: Payer: Self-pay | Admitting: Pulmonary Disease

## 2022-10-12 NOTE — Telephone Encounter (Signed)
Called patient and provided PFT results per Dr. Elsworth Soho.  She verbalized understanding.  Nothing further needed.

## 2022-10-14 ENCOUNTER — Telehealth: Payer: Medicare Other

## 2022-10-14 ENCOUNTER — Telehealth: Payer: Medicare Other | Admitting: Family Medicine

## 2022-10-14 DIAGNOSIS — U071 COVID-19: Secondary | ICD-10-CM | POA: Diagnosis not present

## 2022-10-14 MED ORDER — NIRMATRELVIR/RITONAVIR (PAXLOVID)TABLET: 3.0000 | ORAL_TABLET | Freq: Two times a day (BID) | ORAL | 0 refills | Status: AC

## 2022-10-14 NOTE — Patient Instructions (Signed)
[name], thank you for joining [name], NP for today's virtual visit.  While this provider is not your primary care provider (PCP), if your PCP is located in our provider database this encounter information will be shared with them immediately following your visit.   [facility] account gives you access to today's visit and all your visits, tests, and labs performed at [facility] " click here if you don't have a [facility] account or go to [email or web address]  Consent: (Patient) [name] provided verbal consent for this virtual visit at the beginning of the encounter.  Current Medications:  Current Outpatient Medications:    nirmatrelvir/ritonavir EUA (PAXLOVID) 20 x 150 MG & 10 x '100MG'$  TABS, Take 3 tablets by mouth 2 (two) times daily for 5 days. (Take nirmatrelvir 150 mg two tablets twice daily for 5 days and ritonavir 100 mg one tablet twice daily for 5 days) Patient GFR is 71, Disp: 30 tablet, Rfl: 0   albuterol (VENTOLIN HFA) 108 (90 Base) MCG/ACT inhaler, Inhale 2 puffs into the lungs every 6 (six) hours as needed for wheezing or shortness of breath., Disp: 8 g, Rfl: 2   calcium carbonate (OSCAL) 1500 (600 Ca) MG TABS tablet, Take 600 mg of elemental calcium by mouth daily., Disp: , Rfl:    Cholecalciferol (VITAMIN D) 125 MCG (5000 UT) CAPS, Take 125 mcg by mouth daily., Disp: , Rfl:    gabapentin (NEURONTIN) 100 MG capsule, Take 1 capsule (100 mg total) by mouth 3 (three) times daily., Disp: 90 capsule, Rfl: 3   MULTIPLE VITAMIN tablet, Take 1 tablet by mouth daily., Disp: , Rfl:    nabumetone (RELAFEN) 750 MG tablet, 1 tab by mouth twice per day as needed, Disp: 180 tablet, Rfl: 2   predniSONE (DELTASONE) 10 MG tablet, 3 tabs by mouth per day for 3 days,2tabs per day for 3 days,1tab per day for 3 days, Disp: 18 tablet, Rfl: 0   rosuvastatin (CRESTOR) 10 MG tablet, Take 1 tablet (10 mg total) by mouth daily., Disp: 90 tablet, Rfl: 3    vitamin B-12 (CYANOCOBALAMIN) 1000 MCG tablet, Take 5,000 mcg by mouth daily., Disp: , Rfl:    Medications ordered in this encounter:  Meds ordered this encounter  Medications   nirmatrelvir/ritonavir EUA (PAXLOVID) 20 x 150 MG & 10 x '100MG'$  TABS    Sig: Take 3 tablets by mouth 2 (two) times daily for 5 days. (Take nirmatrelvir 150 mg two tablets twice daily for 5 days and ritonavir 100 mg one tablet twice daily for 5 days) Patient GFR is 71    Dispense:  30 tablet    Refill:  0    Order Specific Question:   Supervising Provider    Answer:   Chase Picket [id number]     *If you need refills on other medications prior to your next appointment, please contact your pharmacy*  Follow-Up: Call back or seek an in-person evaluation if the symptoms worsen or if the condition fails to improve as anticipated.  [place] [phone]  Other Instructions Please keep well-hydrated and get plenty of rest. Start a saline nasal rinse to flush out your nasal passages. You can use plain Mucinex to help thin congestion. If you have a humidifier, running in the bedroom at night. I want you to start OTC vitamin D3 1000 units daily, vitamin C 1000 mg daily, and a zinc supplement. Please take prescribed medications as directed.  You have been enrolled in a MyChart symptom monitoring program. Please answer these questions daily so we can keep track of how you are doing.  You were to quarantine for 5 days from onset of your symptoms.  After day 5, if you have had no fever and you are feeling better, you can end quarantine but need to mask for an additional 5 days. After day 5 if you have a fever or are having significant symptoms, please quarantine for full 10 days.  If you note any worsening of symptoms, any significant shortness of breath or any chest pain, please seek ER evaluation ASAP.  Please do not delay care!  COVID-19: What to Do if You Are Sick If you test positive and are an  older adult or someone who is at high risk of getting very sick from COVID-19, treatment may be available. Contact a healthcare provider right away after a positive test to determine if you are eligible, even if your symptoms are mild right now. You can also visit a Test to Treat location and, if eligible, receive a prescription from a provider. Don't delay: Treatment must be started within the first few days to be effective. If you have a fever, cough, or other symptoms, you might have COVID-19. Most people have mild illness and are able to recover at home. If you are sick: Keep track of your symptoms. If you have an emergency warning sign (including trouble breathing), call 911. Steps to help prevent the spread of COVID-19 if you are sick If you are sick with COVID-19 or think you might have COVID-19, follow the steps below to care for yourself and to help protect other people in your home and community. Stay home except to get medical care Stay home. Most people with COVID-19 have mild illness and can recover at home without medical care. Do not leave your home, except to get medical care. Do not visit public areas and do not go to places where you are unable to wear a mask. Take care of yourself. Get rest and stay hydrated. Take over-the-counter medicines, such as acetaminophen, to help you feel better. Stay in touch with your doctor. Call before you get medical care. Be sure to get care if you have trouble breathing, or have any other emergency warning signs, or if you think it is an emergency. Avoid public transportation, ride-sharing, or taxis if possible. Get tested If you have symptoms of COVID-19, get tested. While waiting for test results, stay away from others, including staying apart from those living in your household. Get tested as soon as possible after your symptoms start. Treatments may be available for people with COVID-19 who are at risk for becoming very sick. Don't delay: Treatment  must be started early to be effective--some treatments must begin within 5 days of your first symptoms. Contact your healthcare provider right away if your test result is positive to determine if you are eligible. Self-tests are one of several options for testing for the virus that causes COVID-19 and may be more convenient than laboratory-based tests and point-of-care tests. Ask your healthcare provider or your local health department if you need help interpreting your test results. You can visit your state, tribal, local, and territorial health department's website to look for the latest local information on testing sites. Separate yourself from other people As much as possible, stay in a specific room and away from other people and pets in your home. If possible, you should use a separate  bathroom. If you need to be around other people or animals in or outside of the home, wear a well-fitting mask. Tell your close contacts that they may have been exposed to COVID-19. An infected person can spread COVID-19 starting 48 hours (or 2 days) before the person has any symptoms or tests positive. By letting your close contacts know they may have been exposed to COVID-19, you are helping to protect everyone. See COVID-19 and Animals if you have questions about pets. If you are diagnosed with COVID-19, someone from the health department may call you. Answer the call to slow the spread. Monitor your symptoms Symptoms of COVID-19 include fever, cough, or other symptoms. Follow care instructions from your healthcare provider and local health department. Your local health authorities may give instructions on checking your symptoms and reporting information. When to seek emergency medical attention Look for emergency warning signs* for COVID-19. If someone is showing any of these signs, seek emergency medical care immediately: Trouble breathing Persistent pain or pressure in the chest New confusion Inability to  wake or stay awake Pale, gray, or blue-colored skin, lips, or nail beds, depending on skin tone *This list is not all possible symptoms. Please call your medical provider for any other symptoms that are severe or concerning to you. Call 911 or call ahead to your local emergency facility: Notify the operator that you are seeking care for someone who has or may have COVID-19. Call ahead before visiting your doctor Call ahead. Many medical visits for routine care are being postponed or done by phone or telemedicine. If you have a medical appointment that cannot be postponed, call your doctor's office, and tell them you have or may have COVID-19. This will help the office protect themselves and other patients. If you are sick, wear a well-fitting mask You should wear a mask if you must be around other people or animals, including pets (even at home). Wear a mask with the best fit, protection, and comfort for you. You don't need to wear the mask if you are alone. If you can't put on a mask (because of trouble breathing, for example), cover your coughs and sneezes in some other way. Try to stay at least 6 feet away from other people. This will help protect the people around you. Masks should not be placed on young children under age 26 years, anyone who has trouble breathing, or anyone who is not able to remove the mask without help. Cover your coughs and sneezes Cover your mouth and nose with a tissue when you cough or sneeze. Throw away used tissues in a lined trash can. Immediately wash your hands with soap and water for at least 20 seconds. If soap and water are not available, clean your hands with an alcohol-based hand sanitizer that contains at least 60% alcohol. Clean your hands often Wash your hands often with soap and water for at least 20 seconds. This is especially important after blowing your nose, coughing, or sneezing; going to the bathroom; and before eating or preparing food. Use hand  sanitizer if soap and water are not available. Use an alcohol-based hand sanitizer with at least 60% alcohol, covering all surfaces of your hands and rubbing them together until they feel dry. Soap and water are the best option, especially if hands are visibly dirty. Avoid touching your eyes, nose, and mouth with unwashed hands. Handwashing Tips Avoid sharing personal household items Do not share dishes, drinking glasses, cups, eating utensils, towels, or bedding with  other people in your home. Wash these items thoroughly after using them with soap and water or put in the dishwasher. Clean surfaces in your home regularly Clean and disinfect high-touch surfaces (for example, doorknobs, tables, handles, light switches, and countertops) in your "sick room" and bathroom. In shared spaces, you should clean and disinfect surfaces and items after each use by the person who is ill. If you are sick and cannot clean, a caregiver or other person should only clean and disinfect the area around you (such as your bedroom and bathroom) on an as needed basis. Your caregiver/other person should wait as long as possible (at least several hours) and wear a mask before entering, cleaning, and disinfecting shared spaces that you use. Clean and disinfect areas that may have blood, stool, or body fluids on them. Use household cleaners and disinfectants. Clean visible dirty surfaces with household cleaners containing soap or detergent. Then, use a household disinfectant. Use a product from [employer] List N: Disinfectants for Coronavirus (WIOXB-35). Be sure to follow the instructions on the label to ensure safe and effective use of the product. Many products recommend keeping the surface wet with a disinfectant for a certain period of time (look at "contact time" on the product label). You may also need to wear personal protective equipment, such as gloves, depending on the directions on the product label. Immediately after  disinfecting, wash your hands with soap and water for 20 seconds. For completed guidance on cleaning and disinfecting your home, visit Complete Disinfection Guidance. Take steps to improve ventilation at home Improve ventilation (air flow) at home to help prevent from spreading COVID-19 to other people in your household. Clear out COVID-19 virus particles in the air by opening windows, using air filters, and turning on fans in your home. Use this interactive tool to learn how to improve air flow in your home. When you can be around others after being sick with COVID-19 Deciding when you can be around others is different for different situations. Find out when you can safely end home isolation. For any additional questions about your care, contact your healthcare provider or state or local health department. 01/26/2021 Content source: [facility] ([facility]), Division of Viral Diseases This information is not intended to replace advice given to you by your health care provider. Make sure you discuss any questions you have with your health care provider. Document Revised: 03/11/2021 Document Reviewed: 03/11/2021 Elsevier Patient Education  2022 [employer].      If you have been instructed to have an in-person evaluation today at a local Urgent Care facility, please use the link below. It will take you to a list of all of our available [facility], including address, phone number and hours of operation. Please do not delay care.  [facility]  If you or a family member do not have a primary care provider, use the link below to schedule a visit and establish care. When you choose a [facility] primary care physician or advanced practice provider, you gain a long-term partner in health. Find a Primary Care Provider  Learn more about [facility] in-office and virtual care options: [facility]

## 2022-10-14 NOTE — Progress Notes (Signed)
Virtual Visit Consent   [name], you are scheduled for a virtual visit with a [facility] provider today. Just as with appointments in the office, your consent must be obtained to participate. Your consent will be active for this visit and any virtual visit you may have with one of our providers in the next 365 days. If you have a MyChart account, a copy of this consent can be sent to you electronically.  As this is a virtual visit, video technology does not allow for your provider to perform a traditional examination. This may limit your provider's ability to fully assess your condition. If your provider identifies any concerns that need to be evaluated in person or the need to arrange testing (such as labs, EKG, etc.), we will make arrangements to do so. Although advances in technology are sophisticated, we cannot ensure that it will always work on either your end or our end. If the connection with a video visit is poor, the visit may have to be switched to a telephone visit. With either a video or telephone visit, we are not always able to ensure that we have a secure connection.  By engaging in this virtual visit, you consent to the provision of healthcare and authorize for your insurance to be billed (if applicable) for the services provided during this visit. Depending on your insurance coverage, you may receive a charge related to this service.  I need to obtain your verbal consent now. Are you willing to proceed with your visit today? [name] has provided verbal consent on 10/14/2022 for a virtual visit (video or telephone). [name], NP  Date: 10/14/2022 1:38 PM  Virtual Visit via Video Note   I, [name], connected with  [name]  ([id number], Feb 20, 1999) on 10/14/22 at  2:00 PM EST by a video-enabled telemedicine application and verified that I am speaking with the correct person using two identifiers.  Location: Patient: Virtual Visit Location Patient:  Home Provider: Virtual Visit Location Provider: Home Office   I discussed the limitations of evaluation and management by telemedicine and the availability of in person appointments. The patient expressed understanding and agreed to proceed.    History of Present Illness: [name] is a 71 y.o. who identifies as a female who was assigned female at birth, and is being seen today for COVID + today.  Symptoms started back 7 weeks ago with an on going cough. Mild ear pain as well.  Was reviewed to Pulm MD- was seen for breathing test- everything looked good. This Wednesday had a headache, Tuesday was having sinus like symptoms.  Currently have running nose, and coughing up mucus. Denies chest pain, shortness of breath, fever, and or chills,and sore throat.  Problems:  Patient Active Problem List   Diagnosis Date Noted   Reactive airway disease with wheezing, mild persistent, with acute exacerbation 10/03/2022   Left lumbar radiculopathy 10/25/2021   Trochanteric bursitis of right hip 08/09/2021   Vitamin D deficiency 08/09/2020   Paresthesia of foot, bilateral 08/09/2020   Neuritis of right ulnar nerve 08/09/2020   Aortic atherosclerosis ([place]) 08/06/2020   Left cervical radiculopathy 10/07/2019   Bilateral low back pain with bilateral sciatica 10/07/2019   Acute conjunctivitis of left eye 01/17/2019   Urinary frequency 01/09/2019   Dysuria 03/28/2018   Trimalleolar fracture 08/20/2017   Ankle fracture 08/20/2017   OSA on CPAP 07/30/2017   Right hip pain 07/28/2017   Abnormal TSH 07/21/2015  Greater trochanteric bursitis of right hip 08/04/2014   Localized osteoarthrosis, lower leg 69/69/8938   Biliary colic 08/23/5101   Cholelithiasis 08/07/2013   Hyperlipidemia 02/13/2012   Impaired glucose tolerance 02/13/2012   Preventative health care 02/11/2012   Diverticulosis 02/11/2012   Degenerative arthritis of hip 02/11/2012   Cervical disc disease 02/11/2012   Allergic rhinitis     Chronic low back pain    Obesity    Blood in stool 08/31/2010   HEMORRHOIDS-INTERNAL 07/21/2010   CONSTIPATION 07/19/2010   ARTHRITIS, KNEE 02/10/2009   VAGINITIS, ATROPHIC 11/11/2008   EXOGENOUS OBESITY 07/29/2008   GERD 08/10/2007   OVERACTIVE BLADDER 08/10/2007    Allergies:  Allergies  Allergen Reactions   Amoxicillin     REACTION: unspecified   Penicillins Other (See Comments)    Unknown reaction, mother had reaction almost died   Medications:  Current Outpatient Medications:    albuterol (VENTOLIN HFA) 108 (90 Base) MCG/ACT inhaler, Inhale 2 puffs into the lungs every 6 (six) hours as needed for wheezing or shortness of breath., Disp: 8 g, Rfl: 2   calcium carbonate (OSCAL) 1500 (600 Ca) MG TABS tablet, Take 600 mg of elemental calcium by mouth daily., Disp: , Rfl:    Cholecalciferol (VITAMIN D) 125 MCG (5000 UT) CAPS, Take 125 mcg by mouth daily., Disp: , Rfl:    gabapentin (NEURONTIN) 100 MG capsule, Take 1 capsule (100 mg total) by mouth 3 (three) times daily., Disp: 90 capsule, Rfl: 3   MULTIPLE VITAMIN tablet, Take 1 tablet by mouth daily., Disp: , Rfl:    nabumetone (RELAFEN) 750 MG tablet, 1 tab by mouth twice per day as needed, Disp: 180 tablet, Rfl: 2   predniSONE (DELTASONE) 10 MG tablet, 3 tabs by mouth per day for 3 days,2tabs per day for 3 days,1tab per day for 3 days, Disp: 18 tablet, Rfl: 0   rosuvastatin (CRESTOR) 10 MG tablet, Take 1 tablet (10 mg total) by mouth daily., Disp: 90 tablet, Rfl: 3   vitamin B-12 (CYANOCOBALAMIN) 1000 MCG tablet, Take 5,000 mcg by mouth daily., Disp: , Rfl:   Observations/Objective: Patient is well-developed, well-nourished in no acute distress.  Resting comfortably  at home.  Head is normocephalic, atraumatic.  No labored breathing.  Speech is clear and coherent with logical content.  Patient is alert and oriented at baseline.    Assessment and Plan:  1. COVID-19  - nirmatrelvir/ritonavir EUA (PAXLOVID) 20 x 150 MG &  10 x '100MG'$  TABS; Take 3 tablets by mouth 2 (two) times daily for 5 days. (Take nirmatrelvir 150 mg two tablets twice daily for 5 days and ritonavir 100 mg one tablet twice daily for 5 days) Patient GFR is 71  Dispense: 30 tablet; Refill: 0  -rest -hydrate -take medication as ordered -covid info reviewed and on AVS   Reviewed side effects, risks and benefits of medication.    Patient acknowledged agreement and understanding of the plan.   Past Medical, Surgical, Social History, Allergies, and Medications have been Reviewed.    Follow Up Instructions: I discussed the assessment and treatment plan with the patient. The patient was provided an opportunity to ask questions and all were answered. The patient agreed with the plan and demonstrated an understanding of the instructions.  A copy of instructions were sent to the patient via MyChart unless otherwise noted below.     The patient was advised to call back or seek an in-person evaluation if the symptoms worsen or if the condition fails  to improve as anticipated.  Time:  I spent 10 minutes with the patient via telehealth technology discussing the above problems/concerns.    [name], NP

## 2022-10-17 ENCOUNTER — Ambulatory Visit (HOSPITAL_BASED_OUTPATIENT_CLINIC_OR_DEPARTMENT_OTHER): Payer: Medicare Other

## 2022-10-24 ENCOUNTER — Ambulatory Visit (HOSPITAL_BASED_OUTPATIENT_CLINIC_OR_DEPARTMENT_OTHER): Payer: Medicare Other

## 2022-10-26 ENCOUNTER — Ambulatory Visit: Payer: Medicare Other | Attending: Internal Medicine | Admitting: Internal Medicine

## 2022-10-26 ENCOUNTER — Encounter: Payer: Self-pay | Admitting: Internal Medicine

## 2022-10-26 VITALS — BP 116/72 | HR 74 | Ht 67.0 in | Wt 221.0 lb

## 2022-10-26 DIAGNOSIS — R072 Precordial pain: Secondary | ICD-10-CM

## 2022-10-26 DIAGNOSIS — K219 Gastro-esophageal reflux disease without esophagitis: Secondary | ICD-10-CM

## 2022-10-26 DIAGNOSIS — G4733 Obstructive sleep apnea (adult) (pediatric): Secondary | ICD-10-CM

## 2022-10-26 DIAGNOSIS — E785 Hyperlipidemia, unspecified: Secondary | ICD-10-CM | POA: Diagnosis not present

## 2022-10-26 DIAGNOSIS — I700 Atherosclerosis of aorta: Secondary | ICD-10-CM | POA: Diagnosis not present

## 2022-10-26 DIAGNOSIS — R9431 Abnormal electrocardiogram [ECG] [EKG]: Secondary | ICD-10-CM

## 2022-10-26 LAB — BASIC METABOLIC PANEL
BUN/Creatinine Ratio: 22 (ref 12–28)
BUN: 20 mg/dL (ref 8–27)
CO2: 25 mmol/L (ref 20–29)
Calcium: 9.7 mg/dL (ref 8.7–10.3)
Chloride: 104 mmol/L (ref 96–106)
Creatinine, Ser: 0.89 mg/dL (ref 0.57–1.00)
Glucose: 88 mg/dL (ref 70–99)
Potassium: 4.5 mmol/L (ref 3.5–5.2)
Sodium: 142 mmol/L (ref 134–144)
eGFR: 70 mL/min/{1.73_m2} (ref >59)

## 2022-10-26 MED ORDER — METOPROLOL TARTRATE 50 MG PO TABS: 50.0000 mg | ORAL_TABLET | Freq: Once | ORAL | 0 refills | Status: DC

## 2022-10-26 NOTE — Patient Instructions (Signed)
Medication Instructions:  PLEASE TAKE METOPROLOL TARTRATE '50mg'$  TWO HOURS PRIOR TO CCTA SCAN  *If you need a refill on your cardiac medications before your next appointment, please call your pharmacy*  Lab Work: BLOOD WORK TODAY  If you have labs (blood work) drawn today and your tests are completely normal, you will receive your results only by: [place] (if you have MyChart) OR A paper copy in the mail If you have any lab test that is abnormal or we need to change your treatment, we will call you to review the results.  Testing/Procedures: Your physician has requested that you have cardiac CT. Cardiac computed tomography (CT) is a painless test that uses an x-ray machine to take clear, detailed pictures of your heart. For further information please visit HugeFiesta[facility]tn. Please follow instruction sheet as given.   Your physician has requested that you have an echocardiogram. Echocardiography is a painless test that uses sound waves to create images of your heart. It provides your doctor with information about the size and shape of your heart and how well your heart's chambers and valves are working. You may receive an ultrasound enhancing agent through an IV if needed to better visualize your heart during the echo.This procedure takes approximately one hour. There are no restrictions for this procedure. This will take place at the [address], [address].   Follow-Up: At [facility], you and your health needs are our priority.  As part of our continuing mission to provide you with exceptional heart care, we have created designated Provider Care Teams.  These Care Teams include your primary Cardiologist (physician) and Advanced Practice Providers (APPs -  Physician Assistants and Nurse Practitioners) who all work together to provide you with the care you need, when you need it.  Your next appointment:   AFTER TESTING   The format for your next appointment:   In  Person  Provider:   Dr. Margaretann Loveless   Other Instructions   Your cardiac CT will be scheduled at one of the below locations:   [facility] [address] [place], [place] [address] [phone]  If scheduled at [facility], please arrive at the [facility] and Children's Entrance (Entrance C2) of [facility] 30 minutes prior to test start time. You can use the FREE valet parking offered at entrance C (encouraged to control the heart rate for the test)  Proceed to the [facility] Radiology Department (first floor) to check-in and test prep.  All radiology patients and guests should use entrance C2 at [facility], accessed from [facility], even though the hospital's physical address listed is [address].    Please follow these instructions carefully (unless otherwise directed):  Hold all erectile dysfunction medications at least 3 days (72 hrs) prior to test. (Ie viagra, cialis, sildenafil, tadalafil, etc) We will administer nitroglycerin during this exam.   On the Night Before the Test: Be sure to Drink plenty of water. Do not consume any caffeinated/decaffeinated beverages or chocolate 12 hours prior to your test. Do not take any antihistamines 12 hours prior to your test.  On the Day of the Test: Drink plenty of water until 1 hour prior to the test. Do not eat any food 1 hour prior to test. You may take your regular medications prior to the test.  Take metoprolol (Lopressor) two hours prior to test. HOLD Furosemide/Hydrochlorothiazide morning of the test. FEMALES- please wear underwire-free bra if available,  avoid dresses & tight clothing  *For Clinical Staff only. Please instruct patient the following:* Heart Rate Medication Recommendations for Cardiac CT  Resting HR < 50 bpm  No medication  Resting HR 50-60 bpm and BP >110/50 mmHG   Consider Metoprolol tartrate 25 mg PO 90-120 min prior to scan  Resting HR 60-65 bpm  and BP >110/50 mmHG  Metoprolol tartrate 50 mg PO 90-120 minutes prior to scan   Resting HR > 65 bpm and BP >110/50 mmHG  Metoprolol tartrate 100 mg PO 90-120 minutes prior to scan  Consider Ivabradine 10-15 mg PO or a calcium channel blocker for resting HR >60 bpm and contraindication to metoprolol tartrate  Consider Ivabradine 10-15 mg PO in combination with metoprolol tartrate for HR >80 bpm         After the Test: Drink plenty of water. After receiving IV contrast, you may experience a mild flushed feeling. This is normal. On occasion, you may experience a mild rash up to 24 hours after the test. This is not dangerous. If this occurs, you can take Benadryl 25 mg and increase your fluid intake. If you experience trouble breathing, this can be serious. If it is severe call 911 IMMEDIATELY. If it is mild, please call our office. If you take any of these medications: Glipizide/Metformin, Avandament, Glucavance, please do not take 48 hours after completing test unless otherwise instructed.  We will call to schedule your test 2-4 weeks out understanding that some insurance companies will need an authorization prior to the service being performed.   For non-scheduling related questions, please contact the cardiac imaging nurse navigator should you have any questions/concerns: Marchia Bond, Cardiac Imaging Nurse Navigator Gordy Clement, Cardiac Imaging Nurse Navigator [facility] Direct Office Dial: [phone]   For scheduling needs, including cancellations and rescheduling, please call [place], [phone].

## 2022-10-26 NOTE — Progress Notes (Signed)
Cardiology Office Note:    Date: 10/26/2022  ID:  [name], DOB [birth year], MRN [id number]  PCP:  Biagio Borg, MD  Cardiologist:  None  Electrophysiologist:  None   Referring MD: Ailene Ards, NP   Chief Complaint/Reason for Referral: Chest pain  History of Present Illness:    [name] is a 71 y.o. female with a history of mitral valve prolapse diagnosed by auscultation historically, hyperlipidemia, and sleep apnea who presents for evaluation of chest pain.   She presented to the ED 08/07/2022 with complaints of right sided chest pain. She reported the sensation like "spasms" that would come and go at random. Troponins were negative x2. No new EKG changes. She was discharged and recommended PCP follow up.   Today, she appears well.  Prior to her ED visit in October 2023, she was experiencing right sided chest pain that would come and go, and then was constant for a couple hours. Since then, she has only experienced short episodes of chest pain every once in a while.  Of note, she mentions that she has trouble with acid reflux, but takes medicine for it daily.   She is about to undergo a sleep study next week. She endorses that she used to use a CPAP machine. She states that she has not been having sleeping trouble lately.  She denies any palpitations, shortness of breath, or peripheral edema. No lightheadedness, headaches, syncope, orthopnea, or PND.  Past Medical History:  Diagnosis Date   Abnormal Pap smear of vagina 06/17/15   LGSIL   Allergic rhinitis, cause unspecified    Arthritis    ARTHRITIS, KNEE 02/10/2009   Qualifier: Diagnosis of  By: Niel Hummer MD, Willie R    BRCA negative 03/25/13   Cervical cancer ([place]) 1984   Cervical disc disease 02/11/2012   Chronic lower back pain    Degenerative arthritis of hip 02/11/2012   Diverticulosis 02/11/2012   GERD 08/10/2007   Qualifier: Diagnosis of  By: Sherlynn Stalls, CMA, Cindy     GERD (gastroesophageal reflux disease)    GERD  (gastroesophageal reflux disease)    Hemorrhoids    HEMORRHOIDS-INTERNAL 07/21/2010   Qualifier: Diagnosis of  By: Chester Holstein NP, Paula     Hiatal hernia    Hyperlipidemia 02/13/2012   Mitral valve prolapse    no cardiologist, mild   OAB (overactive bladder)    Obesity    OVERACTIVE BLADDER 08/10/2007   Qualifier: Diagnosis of  By: Sherlynn Stalls, [place], [place]     Pneumonia 2017   PONV (postoperative nausea and vomiting)    yrs ago unable to raise arms after female surgery, no recent problems   Recurrent UTI    Sleep apnea    uses cpap set on 2   SUI (stress urinary incontinence, female)    Urinary incontinence    VAGINITIS, ATROPHIC 11/11/2008   Qualifier: Diagnosis of  By: Niel Hummer MD, Lorinda Creed     Past Surgical History:  Procedure Laterality Date   bladder tack     x 3 or 4   BUNIONECTOMY Right 2007   CERVICAL DISCECTOMY  2002   fusion and plate C 3   CHOLECYSTECTOMY N/A 08/13/2013   Procedure: LAPAROSCOPIC CHOLECYSTECTOMY WITH INTRAOPERATIVE CHOLANGIOGRAM;  Surgeon: Shann Medal, MD;  Location: [facility];  Service: General;  Laterality: N/A;   CLOSED REDUCTION METATARSAL FRACTURE     rt side 06-03-18, left side 08-19-17   COLONOSCOPY  2018   conization of cervix  1982   with D&C   CYSTOCELE REPAIR  2003   rectocele repair   EYE SURGERY Bilateral yrs ago   lasik   ORIF ANKLE FRACTURE Right 08/20/2017   Procedure: OPEN REDUCTION INTERNAL FIXATION (ORIF) ANKLE FRACTURE;  Surgeon: Meredith Pel, MD;  Location: [facility];  Service: Orthopedics;  Laterality: Right;   SHOULDER ARTHROSCOPY Right 2004   frozen shoulder   TONSILLECTOMY  as child   adenoids removed   TRANSANAL HEMORRHOIDAL DEARTERIALIZATION N/A 07/30/2020   Procedure: TRANSANAL HEMORRHOIDAL DEARTERIALIZATION;  Surgeon: Leighton Ruff, MD;  Location: [facility];  Service: General;  Laterality: N/A;   VAGINAL HYSTERECTOMY  1986   partial    Current Medications: Current Meds  Medication Sig   albuterol  (VENTOLIN HFA) 108 (90 Base) MCG/ACT inhaler Inhale 2 puffs into the lungs every 6 (six) hours as needed for wheezing or shortness of breath.   calcium carbonate (OSCAL) 1500 (600 Ca) MG TABS tablet Take 600 mg of elemental calcium by mouth daily.   Cholecalciferol (VITAMIN D) 125 MCG (5000 UT) CAPS Take 125 mcg by mouth daily.   gabapentin (NEURONTIN) 100 MG capsule Take 1 capsule (100 mg total) by mouth 3 (three) times daily.   metoprolol tartrate (LOPRESSOR) 50 MG tablet Take 1 tablet (50 mg total) by mouth once for 1 dose. PLEASE TAKE METOPROLOL 2  HOURS PRIOR TO CTA SCAN.   MULTIPLE VITAMIN tablet Take 1 tablet by mouth daily.   nabumetone (RELAFEN) 750 MG tablet 1 tab by mouth twice per day as needed   rosuvastatin (CRESTOR) 10 MG tablet Take 1 tablet (10 mg total) by mouth daily.   vitamin B-12 (CYANOCOBALAMIN) 1000 MCG tablet Take 5,000 mcg by mouth daily.     Allergies:   Amoxicillin and Penicillins   Social History   Tobacco Use   Smoking status: Never    Passive exposure: Past   Smokeless tobacco: Never  Vaping Use   Vaping Use: Never used  Substance Use Topics   Alcohol use: Yes    Alcohol/week: 0.0 standard drinks of alcohol    Comment: occ wine   Drug use: No     Family History: The patient's family history includes Breast cancer in her cousin, mother, and sister; Cancer in her maternal grandmother; Diabetes in her brother, maternal aunt, and maternal uncle; Heart disease in her brother and sister; Thyroid cancer in an other family member. There is no history of Colon cancer, Esophageal cancer, Liver cancer, Pancreatic cancer, Rectal cancer, or Stomach cancer.  ROS:   Please see the history of present illness.   (+) Intermittent right sided chest pain  All other systems reviewed and are negative.  EKGs/Labs/Other Studies Reviewed:    The following studies were reviewed today: No prior cardiovascular studies.  EKG: EKG is personally reviewed. 10/26/22: Sinus  rhythm. Rate 74 bpm. Low voltage QRS. Nonspecific ST wave abnormalities.   Imaging studies that I have independently reviewed today: CT cor cal scoring - 0 cor cals. 09/25/2019.  Recent Labs: 08/10/2022: ALT 16; Hemoglobin 13.5; Platelets 246.0; TSH 1.56 10/26/2022: BUN 20; Creatinine, Ser 0.89; Potassium 4.5; Sodium 142  Recent Lipid Panel    Component Value Date/Time   CHOL 178 08/10/2022 1106   TRIG 122.0 08/10/2022 1106   HDL 48.60 08/10/2022 1106   CHOLHDL 4 08/10/2022 1106   VLDL 24.4 08/10/2022 1106   LDLCALC 105 (H) 08/10/2022 1106   LDLCALC 104 (H) 08/06/2020 1204   LDLDIRECT 135.1 05/08/2012 8338  Physical Exam:    VS:  BP 116/72 (BP Location: Right Arm, Patient Position: Sitting, Cuff Size: Large)   Pulse 74   Ht _0  (1.702 m)   Wt 221 lb (100.2 kg)   SpO2 95%   BMI 34.61 kg/m     Wt Readings from Last 5 Encounters:  10/26/22 221 lb (100.2 kg)  10/11/22 222 lb 6.4 oz (100.9 kg)  10/03/22 220 lb (99.8 kg)  09/08/22 220 lb 4 oz (99.9 kg)  08/10/22 227 lb (103 kg)    Constitutional: No acute distress Eyes: sclera non-icteric, normal conjunctiva and lids ENMT: normal dentition, moist mucous membranes Cardiovascular: regular rhythm, normal rate, no murmur. S1 and S2 normal. No jugular venous distention.  Respiratory: clear to auscultation bilaterally GI : normal bowel sounds, soft and nontender. No distention.   MSK: extremities warm, well perfused. No edema.  NEURO: grossly nonfocal exam, moves all extremities. PSYCH: alert and oriented x 3, normal mood and affect.   ASSESSMENT:    1. Precordial pain   2. Nonspecific abnormal electrocardiogram (ECG) (EKG)   3. Aortic atherosclerosis ([place])   4. OSA on CPAP   5. Gastroesophageal reflux disease, unspecified whether esophagitis present   6. Hyperlipidemia, unspecified hyperlipidemia type     PLAN:    Precordial pain - Plan: EKG 12-Lead, ECHOCARDIOGRAM COMPLETE, Basic metabolic panel, CT CORONARY MORPH  W/CTA COR W/SCORE W/CA W/CM &/OR WO/CM Nonspecific abnormal electrocardiogram (ECG) (EKG) - Plan: EKG 12-Lead, ECHOCARDIOGRAM COMPLETE, Basic metabolic panel Aortic atherosclerosis ([facility]) - with chest pain, will obtain CCTA. She is intermediate risk for CAD given age and HLD, and family history, however fortunately had a zero cal score 3 years ago.  - echo to ensure no structural heart disease.   OSA on CPAP - continue evaluation.   Hyperlipidemia, unspecified hyperlipidemia type - LDL above goal of <70. Will assess after CCTA if lipid therapy needs adjustment, for now continue crestor 10 mg daily.  Follow up: post CT and Echo with results.   Total time of encounter: 45 minutes total time of encounter, including 30 minutes spent in face-to-face patient care on the date of this encounter. This time includes coordination of care and counseling regarding above mentioned problem list. Remainder of non-face-to-face time involved reviewing chart documents/testing relevant to the patient encounter and documentation in the medical record. I have independently reviewed documentation from referring provider.   Cherlynn Kaiser, MD, [place]   Shared Decision Making/Informed Consent:       Medication Adjustments/Labs and Tests Ordered: Current medicines are reviewed at length with the patient today.  Concerns regarding medicines are outlined above.   Orders Placed This Encounter  Procedures   CT CORONARY MORPH W/CTA COR W/SCORE W/CA W/CM &/OR WO/CM   Basic metabolic panel   EKG 81-WEXH   ECHOCARDIOGRAM COMPLETE    Meds ordered this encounter  Medications   metoprolol tartrate (LOPRESSOR) 50 MG tablet    Sig: Take 1 tablet (50 mg total) by mouth once for 1 dose. PLEASE TAKE METOPROLOL 2  HOURS PRIOR TO CTA SCAN.    Dispense:  1 tablet    Refill:  0    Patient Instructions  Medication Instructions:  PLEASE TAKE METOPROLOL TARTRATE 41m TWO HOURS PRIOR TO CCTA  SCAN  *If you need a refill on your cardiac medications before your next appointment, please call your pharmacy*  Lab Work: BLOOD WORK TODAY  If you have labs (blood work) drawn today and  your tests are completely normal, you will receive your results only by: MyChart Message (if you have MyChart) OR A paper copy in the mail If you have any lab test that is abnormal or we need to change your treatment, we will call you to review the results.  Testing/Procedures: Your physician has requested that you have cardiac CT. Cardiac computed tomography (CT) is a painless test that uses an x-ray machine to take clear, detailed pictures of your heart. For further information please visit HugeFiesta[facility]tn. Please follow instruction sheet as given.   Your physician has requested that you have an echocardiogram. Echocardiography is a painless test that uses sound waves to create images of your heart. It provides your doctor with information about the size and shape of your heart and how well your heart's chambers and valves are working. You may receive an ultrasound enhancing agent through an IV if needed to better visualize your heart during the echo.This procedure takes approximately one hour. There are no restrictions for this procedure. This will take place at the [address], [address].   Follow-Up: At [facility], you and your health needs are our priority.  As part of our continuing mission to provide you with exceptional heart care, we have created designated Provider Care Teams.  These Care Teams include your primary Cardiologist (physician) and Advanced Practice Providers (APPs -  Physician Assistants and Nurse Practitioners) who all work together to provide you with the care you need, when you need it.  Your next appointment:   AFTER TESTING   The format for your next appointment:   In Person  Provider:   Dr. Margaretann Loveless   Other Instructions   Your cardiac CT will be  scheduled at one of the below locations:   [facility] [address] [place], [place] [address] [phone]  If scheduled at [facility], please arrive at the [facility] and Children's Entrance (Entrance C2) of [facility] 30 minutes prior to test start time. You can use the FREE valet parking offered at entrance C (encouraged to control the heart rate for the test)  Proceed to the [facility] Radiology Department (first floor) to check-in and test prep.  All radiology patients and guests should use entrance C2 at [facility], accessed from [facility], even though the hospital's physical address listed is [address].    Please follow these instructions carefully (unless otherwise directed):  Hold all erectile dysfunction medications at least 3 days (72 hrs) prior to test. (Ie viagra, cialis, sildenafil, tadalafil, etc) We will administer nitroglycerin during this exam.   On the Night Before the Test: Be sure to Drink plenty of water. Do not consume any caffeinated/decaffeinated beverages or chocolate 12 hours prior to your test. Do not take any antihistamines 12 hours prior to your test.  On the Day of the Test: Drink plenty of water until 1 hour prior to the test. Do not eat any food 1 hour prior to test. You may take your regular medications prior to the test.  Take metoprolol (Lopressor) two hours prior to test. HOLD Furosemide/Hydrochlorothiazide morning of the test. FEMALES- please wear underwire-free bra if available, avoid dresses & tight clothing  *For Clinical Staff only. Please instruct patient the following:* Heart Rate Medication Recommendations for Cardiac CT  Resting HR < 50 bpm  No medication  Resting HR 50-60 bpm and BP >110/50 mmHG   Consider Metoprolol tartrate 25 mg PO  90-120 min prior to scan  Resting HR 60-65 bpm and BP >110/50 mmHG  Metoprolol tartrate 50 mg PO 90-120 minutes prior to scan    Resting HR > 65 bpm and BP >110/50 mmHG  Metoprolol tartrate 100 mg PO 90-120 minutes prior to scan  Consider Ivabradine 10-15 mg PO or a calcium channel blocker for resting HR >60 bpm and contraindication to metoprolol tartrate  Consider Ivabradine 10-15 mg PO in combination with metoprolol tartrate for HR >80 bpm         After the Test: Drink plenty of water. After receiving IV contrast, you may experience a mild flushed feeling. This is normal. On occasion, you may experience a mild rash up to 24 hours after the test. This is not dangerous. If this occurs, you can take Benadryl 25 mg and increase your fluid intake. If you experience trouble breathing, this can be serious. If it is severe call 911 IMMEDIATELY. If it is mild, please call our office. If you take any of these medications: Glipizide/Metformin, Avandament, Glucavance, please do not take 48 hours after completing test unless otherwise instructed.  We will call to schedule your test 2-4 weeks out understanding that some insurance companies will need an authorization prior to the service being performed.   For non-scheduling related questions, please contact the cardiac imaging nurse navigator should you have any questions/concerns: Marchia Bond, Cardiac Imaging Nurse Navigator Gordy Clement, Cardiac Imaging Nurse Navigator [facility] Direct Office Dial: [phone]   For scheduling needs, including cancellations and rescheduling, please call [place], [phone].           I,Breanna Adamick,acting as a scribe for Elouise Munroe, MD.,have documented all relevant documentation on the behalf of Elouise Munroe, MD,as directed by  Elouise Munroe, MD while in the presence of Elouise Munroe, MD.  I, Elouise Munroe, MD, have reviewed all documentation for the visit on 10/26/2022. The documentation on today's date of service for the exam, diagnosis, procedures, and orders are all  accurate and complete.

## 2022-11-03 ENCOUNTER — Ambulatory Visit: Payer: Medicare Other

## 2022-11-03 DIAGNOSIS — J4531 Mild persistent asthma with (acute) exacerbation: Secondary | ICD-10-CM

## 2022-11-03 DIAGNOSIS — G4733 Obstructive sleep apnea (adult) (pediatric): Secondary | ICD-10-CM

## 2022-11-16 ENCOUNTER — Telehealth: Payer: Self-pay | Admitting: Pulmonary Disease

## 2022-11-16 DIAGNOSIS — G4733 Obstructive sleep apnea (adult) (pediatric): Secondary | ICD-10-CM | POA: Diagnosis not present

## 2022-11-16 NOTE — Telephone Encounter (Signed)
HST showed mild  OSA with AHI 13/ hr  This is improved from her previous study which showed AHI of 20/hour. Improvement is due to weight loss and further weight loss would help. Meantime, she should get back on her CPAP machine like we discussed during office visit OV with APP in 6 wks

## 2022-11-18 ENCOUNTER — Ambulatory Visit: Payer: Medicare Other | Admitting: Internal Medicine

## 2022-11-22 ENCOUNTER — Telehealth (HOSPITAL_COMMUNITY): Payer: Self-pay | Admitting: *Deleted

## 2022-11-22 ENCOUNTER — Telehealth: Payer: Self-pay | Admitting: Pulmonary Disease

## 2022-11-22 DIAGNOSIS — G4733 Obstructive sleep apnea (adult) (pediatric): Secondary | ICD-10-CM

## 2022-11-22 NOTE — Telephone Encounter (Signed)
Attempted to call patient regarding upcoming cardiac CT appointment. °Left message on voicemail with name and callback number ° °Vanilla Heatherington RN Navigator Cardiac Imaging °[facility] °[phone] Office °[phone] Cell ° °

## 2022-11-23 ENCOUNTER — Ambulatory Visit (HOSPITAL_BASED_OUTPATIENT_CLINIC_OR_DEPARTMENT_OTHER): Payer: Medicare Other

## 2022-11-23 DIAGNOSIS — R072 Precordial pain: Secondary | ICD-10-CM

## 2022-11-23 DIAGNOSIS — R9431 Abnormal electrocardiogram [ECG] [EKG]: Secondary | ICD-10-CM | POA: Diagnosis not present

## 2022-11-23 LAB — ECHOCARDIOGRAM COMPLETE
Area-P 1/2: 3.5 cm2
S' Lateral: 2.5 cm

## 2022-11-23 NOTE — Telephone Encounter (Signed)
Placed an order for CPAP supplies to [facility] per Dr Elsworth Soho. Nothing further needed at this time.

## 2022-11-23 NOTE — Telephone Encounter (Signed)
Called Pt to give her the results to her HST. I also scheduled an appointment for 6 weeks out with [facility] for a follow up. Pt stated understanding and nothing further needed.

## 2022-11-24 ENCOUNTER — Other Ambulatory Visit: Payer: Self-pay

## 2022-11-24 ENCOUNTER — Telehealth: Payer: Self-pay | Admitting: Internal Medicine

## 2022-11-24 ENCOUNTER — Ambulatory Visit (HOSPITAL_COMMUNITY)
Admission: RE | Admit: 2022-11-24 | Discharge: 2022-11-24 | Disposition: A | Discharge status: Home / Self Care | Payer: Medicare Other | Source: Ambulatory Visit | Attending: Internal Medicine | Admitting: Internal Medicine

## 2022-11-24 DIAGNOSIS — E785 Hyperlipidemia, unspecified: Secondary | ICD-10-CM

## 2022-11-24 DIAGNOSIS — R9431 Abnormal electrocardiogram [ECG] [EKG]: Secondary | ICD-10-CM | POA: Diagnosis not present

## 2022-11-24 DIAGNOSIS — R072 Precordial pain: Secondary | ICD-10-CM | POA: Insufficient documentation

## 2022-11-24 MED ORDER — IOHEXOL 350 MG/ML SOLN
100.0000 mL | Freq: Once | INTRAVENOUS | Status: AC | PRN
  Administered 2022-11-24: 100 mL via INTRAVENOUS

## 2022-11-24 MED ORDER — NITROGLYCERIN 0.4 MG SL SUBL
0.8000 mg | SUBLINGUAL_TABLET | Freq: Once | SUBLINGUAL | Status: AC
  Administered 2022-11-24: 0.8 mg via SUBLINGUAL

## 2022-11-24 MED ORDER — NITROGLYCERIN 0.4 MG SL SUBL
SUBLINGUAL_TABLET | SUBLINGUAL | Status: AC
  Filled 2022-11-24: qty 2

## 2022-11-24 MED ORDER — ROSUVASTATIN CALCIUM 20 MG PO TABS: 20.0000 mg | ORAL_TABLET | Freq: Every day | ORAL | 3 refills | Status: DC

## 2022-11-24 NOTE — Telephone Encounter (Signed)
Patient was returning call. Please advise ?

## 2022-11-24 NOTE — Telephone Encounter (Signed)
  Kelly Munroe, MD 11/24/2022 11:54 AM EST     Mild CAD, reassuring that this is not causing chest pain. With LDL of 105, would consider increasing dose of statin from crestor 10 mg daily to 20 mg daily and see how she feels. If she increases the dose, would recommend getting lipids and lfts prior to our next follow up in April.   No action required on incidental findings of PFO and esophageal air fluid level suggestive of GERD (she has known gerd).    Kelly Munroe, MD 11/24/2022 11:54 AM EST     Grossly normal echocardiogram. No evidence of mitral valve prolapse.    Spoke with patient regarding the following results. Patient made aware and patient verbalized understanding.  Lipids/LFTS ordered- Medication sent to patient preferred pharmacy.

## 2022-12-02 ENCOUNTER — Telehealth: Payer: Self-pay | Admitting: Pulmonary Disease

## 2022-12-02 NOTE — Telephone Encounter (Signed)
Message received from [employer]:  [name], [name]; [name]; [name], [place],  I need some help. We received and updated order, but it was for supplies. IS the patient needing a [name] pap unit? The original order looked like it was for a [name] cpap setup, the updated order is for supplies only. If she is needing a [name] cpap we will need a [name] order to reflect the sleep study and [id number] date ( on or after ).  Please advise.  Thank you,  BDemetrius Charity

## 2023-01-02 DIAGNOSIS — Z6835 Body mass index (BMI) 35.0-35.9, adult: Secondary | ICD-10-CM | POA: Diagnosis not present

## 2023-01-02 DIAGNOSIS — M5412 Radiculopathy, cervical region: Secondary | ICD-10-CM | POA: Diagnosis not present

## 2023-01-04 ENCOUNTER — Ambulatory Visit (INDEPENDENT_AMBULATORY_CARE_PROVIDER_SITE_OTHER): Payer: Medicare Other | Admitting: Nurse Practitioner

## 2023-01-04 ENCOUNTER — Encounter: Payer: Self-pay | Admitting: Nurse Practitioner

## 2023-01-04 VITALS — BP 108/78 | HR 84 | Ht 67.0 in | Wt 224.1 lb

## 2023-01-04 DIAGNOSIS — J4521 Mild intermittent asthma with (acute) exacerbation: Secondary | ICD-10-CM | POA: Diagnosis not present

## 2023-01-04 DIAGNOSIS — G4733 Obstructive sleep apnea (adult) (pediatric): Secondary | ICD-10-CM

## 2023-01-04 DIAGNOSIS — J4520 Mild intermittent asthma, uncomplicated: Secondary | ICD-10-CM | POA: Insufficient documentation

## 2023-01-04 DIAGNOSIS — J019 Acute sinusitis, unspecified: Secondary | ICD-10-CM | POA: Diagnosis not present

## 2023-01-04 DIAGNOSIS — R059 Cough, unspecified: Secondary | ICD-10-CM

## 2023-01-04 LAB — POCT EXHALED NITRIC OXIDE: FeNO level (ppb): 46

## 2023-01-04 MED ORDER — FLUTICASONE PROPIONATE 50 MCG/ACT NA SUSP: 2.0000 | Freq: Every day | NASAL | 2 refills | Status: DC

## 2023-01-04 MED ORDER — DOXYCYCLINE HYCLATE 100 MG PO TABS: 100.0000 mg | ORAL_TABLET | Freq: Two times a day (BID) | ORAL | 0 refills | Status: AC

## 2023-01-04 MED ORDER — PULMICORT FLEXHALER 90 MCG/ACT IN AEPB: 1.0000 | INHALATION_SPRAY | Freq: Two times a day (BID) | RESPIRATORY_TRACT | 5 refills | Status: DC

## 2023-01-04 MED ORDER — BENZONATATE 200 MG PO CAPS: 200.0000 mg | ORAL_CAPSULE | Freq: Three times a day (TID) | ORAL | 1 refills | Status: DC | PRN

## 2023-01-04 NOTE — Assessment & Plan Note (Addendum)
Asthmatic bronchitis with elevated exhaled nitric oxide. This is her second exacerbation in less than 6 months. Seems to follow URI/sinusitis. Mild elevation in eosinophils on previous CBC. We will treat her with prednisone burst and empiric course of doxycycline. Reviewed role of maintenance therapies. Plan to start scheduled ICS. Teachback performed today. Target cough control measures. Action plan in place.   Patient Instructions  Continue Albuterol inhaler 2 puffs every 6 hours as needed for shortness of breath or wheezing. Notify if symptoms persist despite rescue inhaler/neb use.  Start Pulmicort 1 puff Twice daily. Brush tongue and rinse mouth afterwards   Doxycycline 1 tab Twice daily for 7 days. Take with food. Wear sunscreen while taking and avoid excessive sun exposure as this does increase your risk for sunburns Prednisone 40 mg daily for 5 days. Take in AM with food  Saline rinses 1-2 times a day. Follow this with your nasal spray 20-30 min later  Flonase nasal spray 2 sprays each nostril daily for nasal congestion/drainage Mucinex DM Twice daily for cough/congestion Benzonatate 1 capsule Three times a day for cough. Take consistently over the next 3-4 days  Can start over the counter allergy pill such as zyrtec, claritin or xyzal   Referral to orthodontics for oral appliance fitting  Follow up in 2 weeks with Dr. Elsworth Soho or Alanson Aly. If symptoms do not improve or worsen, please contact office for sooner follow up or seek emergency care.

## 2023-01-04 NOTE — Assessment & Plan Note (Signed)
Persistent sinus symptoms x 1-2 months. She had similar symptoms in October as well. May consider CT sinus if she continues to have recurrent infections. We will start her on intranasal steroid and saline rinses. Unclear if there is an allergic component; ok to trial antihistamine.

## 2023-01-04 NOTE — Progress Notes (Signed)
$'@Patient'b$  ID: [name], female    DOB: 1951/10/09, 72 y.o.   MRN: [id number]  Chief Complaint  Patient presents with   Follow-up    Pt f/u she states that she is doing well other than maybe having some allergies. She states that after spending anytime outdoors she seems to be mor congested. Not using CPAP    Referring provider: Biagio Borg, MD  HPI: 72 year old female, never smoker followed for mild OSA and reactive airway disease. She is a patient of Dr. Bari Mantis and last seen in office 10/11/2022. Past medical history significant for GERD, rhinitis, obesity, HLD, vit d deficiency.   TEST/EVENTS:  10/12/2023 PFT: FVC 84, FEV1 93, ratio 90. No BD; did have midflow reversibility 11/03/2022 HST: AHI 12.7/h, SpO2 low 78%  10/11/2022: OV with Dr. Elsworth Soho. Seen in the past for OSA, maintained on auto CPAP. Lost 10 pounds since diagnosis. Previous use of CPAP with recurrent sinus infections, despite alternative masks. HST ordered. Onset of cough second week of October with white phlegm, no DOE. Given albuterol MDI without relief. CXR clear with persistent elevated right hemidiaphragm. Saw PCP 11/27 and given depo medrol and prednisone course, which has improved symptoms and cough almost resolved. PFTs ordered.   01/04/2023: Today - follow up Patient presents today for follow up; however, she is also struggling with some acute symptoms. She tells me that around 1-2 months ago, she developed nasal congestion and drainage. She thought it was just allergies. Symptoms have persisted since and she has now developed an associated cough. Cough is dry during the day but productive in AM. Phlegm is yellow in color. She does notice a lot of postnasal drip. She does not feel like she is necessarily more short winded. Denies any fevers, chills, hemoptysis, wheezing, sinus tenderness, headaches. She has not use her albuterol inhaler. She is not doing any nasal sprays/over the counter medications. She was started on  CPAP after her last visit. Doesn't feel like she can tolerate it. She feels like the mask makes her claustrophobic. She would prefer to try an oral appliance. Denies drowsy driving.   FeNO 46 ppb  Allergies  Allergen Reactions   Amoxicillin     REACTION: unspecified   Penicillins Other (See Comments)    Unknown reaction, mother had reaction almost died    Immunization History  Administered Date(s) Administered   Fluad Quad(high Dose 65+) 08/02/2019, 08/06/2020, 08/09/2021, 08/10/2022   Influenza Split 09/12/2012   Influenza Whole 10/03/2007, 08/16/2010   Influenza,inj,Quad PF,6+ Mos 08/07/2013, 07/18/2014, 07/21/2015, 07/21/2016, 07/28/2017, 07/31/2018   PFIZER(Purple Top)SARS-COV-2 Vaccination 12/13/2019, 01/07/2020, 10/12/2020   Pneumococcal Conjugate-13 08/19/2019   Td 09/08/2003   Tdap 03/20/2014    Past Medical History:  Diagnosis Date   Abnormal Pap smear of vagina 06/17/15   LGSIL   Allergic rhinitis, cause unspecified    Arthritis    ARTHRITIS, KNEE 02/10/2009   Qualifier: Diagnosis of  By: Niel Hummer MD, Willie R    BRCA negative 03/25/13   Cervical cancer ([place]) 1984   Cervical disc disease 02/11/2012   Chronic lower back pain    Degenerative arthritis of hip 02/11/2012   Diverticulosis 02/11/2012   GERD 08/10/2007   Qualifier: Diagnosis of  By: Sherlynn Stalls, CMA, Cindy     GERD (gastroesophageal reflux disease)    GERD (gastroesophageal reflux disease)    Hemorrhoids    HEMORRHOIDS-INTERNAL 07/21/2010   Qualifier: Diagnosis of  By: Chester Holstein NP, Paula     Hiatal hernia  Hyperlipidemia 02/13/2012   Mitral valve prolapse    no cardiologist, mild   OAB (overactive bladder)    Obesity    OVERACTIVE BLADDER 08/10/2007   Qualifier: Diagnosis of  By: Sherlynn Stalls, CMA, Cindy     Pneumonia 2017   PONV (postoperative nausea and vomiting)    yrs ago unable to raise arms after female surgery, no recent problems   Recurrent UTI    Sleep apnea    uses cpap set on 2   SUI (stress  urinary incontinence, female)    Urinary incontinence    VAGINITIS, ATROPHIC 11/11/2008   Qualifier: Diagnosis of  By: Niel Hummer MD, Lorinda Creed     Tobacco History: Social History   Tobacco Use  Smoking Status Never   Passive exposure: Past  Smokeless Tobacco Never   Counseling given: Not Answered   Outpatient Medications Prior to Visit  Medication Sig Dispense Refill   albuterol (VENTOLIN HFA) 108 (90 Base) MCG/ACT inhaler Inhale 2 puffs into the lungs every 6 (six) hours as needed for wheezing or shortness of breath. 8 g 2   calcium carbonate (OSCAL) 1500 (600 Ca) MG TABS tablet Take 600 mg of elemental calcium by mouth daily.     Cholecalciferol (VITAMIN D) 125 MCG (5000 UT) CAPS Take 125 mcg by mouth daily.     gabapentin (NEURONTIN) 100 MG capsule Take 1 capsule (100 mg total) by mouth 3 (three) times daily. 90 capsule 3   MULTIPLE VITAMIN tablet Take 1 tablet by mouth daily.     nabumetone (RELAFEN) 750 MG tablet 1 tab by mouth twice per day as needed 180 tablet 2   omeprazole (PRILOSEC) 40 MG capsule Take 40 mg by mouth daily.     rosuvastatin (CRESTOR) 20 MG tablet Take 1 tablet (20 mg total) by mouth daily. 90 tablet 3   vitamin B-12 (CYANOCOBALAMIN) 1000 MCG tablet Take 5,000 mcg by mouth daily.     metoprolol tartrate (LOPRESSOR) 50 MG tablet Take 1 tablet (50 mg total) by mouth once for 1 dose. PLEASE TAKE METOPROLOL 2  HOURS PRIOR TO CTA SCAN. 1 tablet 0   No facility-administered medications prior to visit.     Review of Systems:   Constitutional: No weight loss or gain, night sweats, fevers, chills, or lassitude. +daytime fatigue (baseline) HEENT: No headaches, difficulty swallowing, tooth/dental problems, or sore throat. No sneezing, itching, ear ache. +nasal congestion/drainage, post nasal drip CV:  No chest pain, orthopnea, PND, swelling in lower extremities, anasarca, dizziness, palpitations, syncope Resp: +cough. No shortness of breath with exertion or at  rest.  No hemoptysis. No wheezing.  No chest wall deformity GI:  No heartburn, indigestion, abdominal pain, nausea, vomiting, diarrhea, change in bowel habits, loss of appetite, bloody stools.  GU: No dysuria, change in color of urine, urgency or frequency.  Skin: No rash, lesions, ulcerations MSK:  No joint pain or swelling.   Neuro: No dizziness or lightheadedness.  Psych: No depression or anxiety. Mood stable.     Physical Exam:  BP 108/78   Pulse 84   Ht '5\' 7"'$  (1.702 m)   Wt 224 lb 1.6 oz (101.7 kg)   SpO2 97%   BMI 35.10 kg/m   GEN: Pleasant, interactive, well-appearing; in no acute distress. HEENT:  Normocephalic and atraumatic. EACs patent bilaterally. TM pearly gray with present light reflex bilaterally. PERRLA. Sclera white. Nasal turbinates erythematous, moist and patent bilaterally. White rhinorrhea present. Oropharynx erythematous and moist, without exudate or edema. No lesions,  ulcerations NECK:  Supple w/ fair ROM. No JVD present. Normal carotid impulses w/o bruits. Thyroid symmetrical with no goiter or nodules palpated. No lymphadenopathy.   CV: RRR, no m/r/g, no peripheral edema. Pulses intact, +2 bilaterally. No cyanosis, pallor or clubbing. PULMONARY:  Unlabored, regular breathing. End expiratory wheezes bilaterally A&P. No accessory muscle use.  GI: BS present and normoactive. Soft, non-tender to palpation. No organomegaly or masses detected.  MSK: No erythema, warmth or tenderness. Cap refil <2 sec all extrem. No deformities or joint swelling noted.  Neuro: A/Ox3. No focal deficits noted.   Skin: Warm, no lesions or rashe Psych: Normal affect and behavior. Judgement and thought content appropriate.     Lab Results:  CBC    Component Value Date/Time   WBC 10.1 08/10/2022 1106   RBC 4.49 08/10/2022 1106   HGB 13.5 08/10/2022 1106   HCT 39.6 08/10/2022 1106   PLT 246.0 08/10/2022 1106   MCV 88.2 08/10/2022 1106   MCH 30.0 08/07/2022 1753   MCHC 34.0  08/10/2022 1106   RDW 13.1 08/10/2022 1106   LYMPHSABS 2.8 08/10/2022 1106   MONOABS 0.6 08/10/2022 1106   EOSABS 0.2 08/10/2022 1106   BASOSABS 0.1 08/10/2022 1106    BMET    Component Value Date/Time   NA 142 10/26/2022 1101   K 4.5 10/26/2022 1101   CL 104 10/26/2022 1101   CO2 25 10/26/2022 1101   GLUCOSE 88 10/26/2022 1101   GLUCOSE 108 (H) 08/10/2022 1106   BUN 20 10/26/2022 1101   CREATININE 0.89 10/26/2022 1101   CREATININE 0.98 08/06/2020 1203   CALCIUM 9.7 10/26/2022 1101   GFRNONAA >60 08/07/2022 1753   GFRNONAA 60 08/06/2020 1203   GFRAA 70 08/06/2020 1203    BNP No results found for: "BNP"   Imaging:  No results found.       Latest Ref Rng & Units 10/11/2022   10:19 AM  PFT Results  FVC-Pre L 2.87   FVC-Predicted Pre % 84   FVC-Post L 2.83   FVC-Predicted Post % 83   Pre FEV1/FVC % % 84   Post FEV1/FCV % % 90   FEV1-Pre L 2.42   FEV1-Predicted Pre % 93   FEV1-Post L 2.54     No results found for: "NITRICOXIDE"      Assessment & Plan:   Asthmatic bronchitis, mild intermittent, with acute exacerbation Asthmatic bronchitis with elevated exhaled nitric oxide. This is her second exacerbation in less than 6 months. Seems to follow URI/sinusitis. Mild elevation in eosinophils on previous CBC. We will treat her with prednisone burst and empiric course of doxycycline. Reviewed role of maintenance therapies. Plan to start scheduled ICS. Teachback performed today. Target cough control measures. Action plan in place.   Patient Instructions  Continue Albuterol inhaler 2 puffs every 6 hours as needed for shortness of breath or wheezing. Notify if symptoms persist despite rescue inhaler/neb use.  Start Pulmicort 1 puff Twice daily. Brush tongue and rinse mouth afterwards   Doxycycline 1 tab Twice daily for 7 days. Take with food. Wear sunscreen while taking and avoid excessive sun exposure as this does increase your risk for sunburns Prednisone 40 mg  daily for 5 days. Take in AM with food  Saline rinses 1-2 times a day. Follow this with your nasal spray 20-30 min later  Flonase nasal spray 2 sprays each nostril daily for nasal congestion/drainage Mucinex DM Twice daily for cough/congestion Benzonatate 1 capsule Three times a day for cough. Take  consistently over the next 3-4 days  Can start over the counter allergy pill such as zyrtec, claritin or xyzal   Referral to orthodontics for oral appliance fitting  Follow up in 2 weeks with Dr. Elsworth Soho or Alanson Aly. If symptoms do not improve or worsen, please contact office for sooner follow up or seek emergency care.     Acute sinusitis Persistent sinus symptoms x 1-2 months. She had similar symptoms in October as well. May consider CT sinus if she continues to have recurrent infections. We will start her on intranasal steroid and saline rinses. Unclear if there is an allergic component; ok to trial antihistamine.   OSA on CPAP Mild OSA with improvement from weight loss. She has trouble tolerating CPAP therapy. She would prefer to move forward with oral appliance. We did discuss that these can be costly. Plan to place referral so she can obtain an estimate for the device. Encouraged her to continue working on healthy weight loss measures. We discussed how untreated sleep apnea puts an individual at risk for cardiac arrhthymias, pulm HTN, DM, stroke and increases their risk for daytime accidents. We also briefly reviewed treatment options including weight loss, side sleeping position, oral appliance, CPAP therapy. Cautioned on safe driving practices.    I spent 42 minutes of dedicated to the care of this patient on the date of this encounter to include pre-visit review of records, face-to-face time with the patient discussing conditions above, post visit ordering of testing, clinical documentation with the electronic health record, making appropriate referrals as documented, and communicating  necessary findings to members of the patients care team.  Clayton Bibles, NP 01/04/2023  Pt aware and understands NP's role.

## 2023-01-04 NOTE — Assessment & Plan Note (Signed)
Mild OSA with improvement from weight loss. She has trouble tolerating CPAP therapy. She would prefer to move forward with oral appliance. We did discuss that these can be costly. Plan to place referral so she can obtain an estimate for the device. Encouraged her to continue working on healthy weight loss measures. We discussed how untreated sleep apnea puts an individual at risk for cardiac arrhthymias, pulm HTN, DM, stroke and increases their risk for daytime accidents. We also briefly reviewed treatment options including weight loss, side sleeping position, oral appliance, CPAP therapy. Cautioned on safe driving practices.

## 2023-01-04 NOTE — Patient Instructions (Addendum)
Continue Albuterol inhaler 2 puffs every 6 hours as needed for shortness of breath or wheezing. Notify if symptoms persist despite rescue inhaler/neb use.  Start Pulmicort 1 puff Twice daily. Brush tongue and rinse mouth afterwards   Doxycycline 1 tab Twice daily for 7 days. Take with food. Wear sunscreen while taking and avoid excessive sun exposure as this does increase your risk for sunburns Prednisone 40 mg daily for 5 days. Take in AM with food  Saline rinses 1-2 times a day. Follow this with your nasal spray 20-30 min later  Flonase nasal spray 2 sprays each nostril daily for nasal congestion/drainage Mucinex DM Twice daily for cough/congestion Benzonatate 1 capsule Three times a day for cough. Take consistently over the next 3-4 days  Can start over the counter allergy pill such as zyrtec, claritin or xyzal   Referral to orthodontics for oral appliance fitting  Follow up in 2 weeks with Dr. Elsworth Soho or Alanson Aly. If symptoms do not improve or worsen, please contact office for sooner follow up or seek emergency care.

## 2023-01-06 ENCOUNTER — Other Ambulatory Visit: Payer: Self-pay

## 2023-01-06 ENCOUNTER — Telehealth: Payer: Self-pay | Admitting: Radiation Oncology

## 2023-01-06 MED ORDER — PREDNISONE 20 MG PO TABS: 20.0000 mg | ORAL_TABLET | Freq: Two times a day (BID) | ORAL | 0 refills | Status: AC

## 2023-01-06 NOTE — Telephone Encounter (Signed)
PT did not have Pred called in as per Katie's promise at last visit. Please call in to Pharm is [facility] on [address].   [facility].    [phone] pls call to advise.

## 2023-01-06 NOTE — Telephone Encounter (Signed)
Sent in prescription for prednisone per Leonette Most last note. Patient is aware. Nothing further needed.

## 2023-01-17 DIAGNOSIS — H53149 Visual discomfort, unspecified: Secondary | ICD-10-CM | POA: Diagnosis not present

## 2023-01-17 DIAGNOSIS — H40053 Ocular hypertension, bilateral: Secondary | ICD-10-CM | POA: Diagnosis not present

## 2023-01-17 DIAGNOSIS — H40013 Open angle with borderline findings, low risk, bilateral: Secondary | ICD-10-CM | POA: Diagnosis not present

## 2023-01-17 DIAGNOSIS — H2513 Age-related nuclear cataract, bilateral: Secondary | ICD-10-CM | POA: Diagnosis not present

## 2023-01-17 DIAGNOSIS — H43393 Other vitreous opacities, bilateral: Secondary | ICD-10-CM | POA: Diagnosis not present

## 2023-01-18 ENCOUNTER — Ambulatory Visit: Payer: Medicare Other | Admitting: Nurse Practitioner

## 2023-01-30 ENCOUNTER — Encounter: Payer: Self-pay | Admitting: Nurse Practitioner

## 2023-01-30 ENCOUNTER — Ambulatory Visit (INDEPENDENT_AMBULATORY_CARE_PROVIDER_SITE_OTHER): Payer: Medicare Other | Admitting: Nurse Practitioner

## 2023-01-30 VITALS — BP 128/64 | HR 94 | Temp 98.2°F | Ht 67.0 in | Wt 231.8 lb

## 2023-01-30 DIAGNOSIS — G4733 Obstructive sleep apnea (adult) (pediatric): Secondary | ICD-10-CM | POA: Diagnosis not present

## 2023-01-30 DIAGNOSIS — J4521 Mild intermittent asthma with (acute) exacerbation: Secondary | ICD-10-CM

## 2023-01-30 DIAGNOSIS — R059 Cough, unspecified: Secondary | ICD-10-CM

## 2023-01-30 LAB — POCT EXHALED NITRIC OXIDE: FeNO level (ppb): 48

## 2023-01-30 NOTE — Assessment & Plan Note (Addendum)
Mild OSA with AHI 12.7/h. She is awaiting appointment with Dr. Toy Cookey from orthodontics for oral appliance fitting. If this is not affordable for her, she will restart CPAP therapy and we will trial alternative mask options. Cautioned on safe driving practices.

## 2023-01-30 NOTE — Progress Notes (Signed)
@Patient  ID: [name], female    DOB: 1951-11-05, 72 y.o.   MRN: [id number]  No chief complaint on file.   Referring provider: Biagio Borg, MD  HPI: 72 year old female, never smoker followed for mild OSA and reactive airway disease. She is a patient of Dr. Bari Mantis and last seen in office 01/04/2023 by [facility] NP. Past medical history significant for GERD, rhinitis, obesity, HLD, vit d deficiency.   TEST/EVENTS:  10/12/2023 PFT: FVC 84, FEV1 93, ratio 90. No BD; did have midflow reversibility 11/03/2022 HST: AHI 12.7/h, SpO2 low 78% 01/04/2023 FeNO 46 ppb  10/11/2022: OV with Dr. Elsworth Soho. Seen in the past for OSA, maintained on auto CPAP. Lost 10 pounds since diagnosis. Previous use of CPAP with recurrent sinus infections, despite alternative masks. HST ordered. Onset of cough second week of October with white phlegm, no DOE. Given albuterol MDI without relief. CXR clear with persistent elevated right hemidiaphragm. Saw PCP 11/27 and given depo medrol and prednisone course, which has improved symptoms and cough almost resolved. PFTs ordered.   01/04/2023: OV with Stewart Sasaki NP for follow up; however, she is also struggling with some acute symptoms. She tells me that around 1-2 months ago, she developed nasal congestion and drainage. She thought it was just allergies. Symptoms have persisted since and she has now developed an associated cough. Cough is dry during the day but productive in AM. Phlegm is yellow in color. She does notice a lot of postnasal drip. She does not feel like she is necessarily more short winded. Denies any fevers, chills, hemoptysis, wheezing, sinus tenderness, headaches. She has not use her albuterol inhaler. She is not doing any nasal sprays/over the counter medications. She was started on CPAP after her last visit. Doesn't feel like she can tolerate it. She feels like the mask makes her claustrophobic. She would prefer to try an oral appliance. Denies drowsy driving.   [id number]:  Today - follow up Patient presents today for follow up. She is feeling much better after her last visit. Cough and nasal symptoms are resolved. She has some occasional throat clearing but feels like her postnasal drainage has improved. Breathing feels like it's at her baseline. She has not started the Pulmicort inhaler. She has not required her rescue.  She did get a call from Dr. Corky Sing office about setting an appointment for the oral appliance. She has to call them back to schedule this for treatment of her OSA.  Allergies  Allergen Reactions   Amoxicillin     REACTION: unspecified   Penicillins Other (See Comments)    Unknown reaction, mother had reaction almost died    Immunization History  Administered Date(s) Administered   Fluad Quad(high Dose 65+) 08/02/2019, 08/06/2020, 08/09/2021, 08/10/2022   Influenza Split 09/12/2012   Influenza Whole 10/03/2007, 08/16/2010   Influenza,inj,Quad PF,6+ Mos 08/07/2013, 07/18/2014, 07/21/2015, 07/21/2016, 07/28/2017, 07/31/2018   PFIZER(Purple Top)SARS-COV-2 Vaccination 12/13/2019, 01/07/2020, 10/12/2020   Pneumococcal Conjugate-13 08/19/2019   Td 09/08/2003   Tdap 03/20/2014    Past Medical History:  Diagnosis Date   Abnormal Pap smear of vagina 06/17/15   LGSIL   Allergic rhinitis, cause unspecified    Arthritis    ARTHRITIS, KNEE 02/10/2009   Qualifier: Diagnosis of  By: Niel Hummer MD, Willie R    BRCA negative 03/25/13   Cervical cancer ([place]) 1984   Cervical disc disease 02/11/2012   Chronic lower back pain    Degenerative arthritis of hip 02/11/2012   Diverticulosis 02/11/2012  GERD 08/10/2007   Qualifier: Diagnosis of  By: Sherlynn Stalls, CMA, Cindy     GERD (gastroesophageal reflux disease)    GERD (gastroesophageal reflux disease)    Hemorrhoids    HEMORRHOIDS-INTERNAL 07/21/2010   Qualifier: Diagnosis of  By: Chester Holstein NP, Paula     Hiatal hernia    Hyperlipidemia 02/13/2012   Mitral valve prolapse    no cardiologist, mild   OAB  (overactive bladder)    Obesity    OVERACTIVE BLADDER 08/10/2007   Qualifier: Diagnosis of  By: Sherlynn Stalls, CMA, Cindy     Pneumonia 2017   PONV (postoperative nausea and vomiting)    yrs ago unable to raise arms after female surgery, no recent problems   Recurrent UTI    Sleep apnea    uses cpap set on 2   SUI (stress urinary incontinence, female)    Urinary incontinence    VAGINITIS, ATROPHIC 11/11/2008   Qualifier: Diagnosis of  By: Niel Hummer MD, Lorinda Creed     Tobacco History: Social History   Tobacco Use  Smoking Status Never   Passive exposure: Past  Smokeless Tobacco Never   Counseling given: Not Answered   Outpatient Medications Prior to Visit  Medication Sig Dispense Refill   albuterol (VENTOLIN HFA) 108 (90 Base) MCG/ACT inhaler Inhale 2 puffs into the lungs every 6 (six) hours as needed for wheezing or shortness of breath. 8 g 2   benzonatate (TESSALON) 200 MG capsule Take 1 capsule (200 mg total) by mouth 3 (three) times daily as needed for cough. 30 capsule 1   Budesonide (PULMICORT FLEXHALER) 90 MCG/ACT inhaler Inhale 1 puff into the lungs 2 (two) times daily. 1 each 5   calcium carbonate (OSCAL) 1500 (600 Ca) MG TABS tablet Take 600 mg of elemental calcium by mouth daily.     Cholecalciferol (VITAMIN D) 125 MCG (5000 UT) CAPS Take 125 mcg by mouth daily.     fluticasone (FLONASE) 50 MCG/ACT nasal spray Place 2 sprays into both nostrils daily. 18.2 mL 2   gabapentin (NEURONTIN) 100 MG capsule Take 1 capsule (100 mg total) by mouth 3 (three) times daily. 90 capsule 3   MULTIPLE VITAMIN tablet Take 1 tablet by mouth daily.     nabumetone (RELAFEN) 750 MG tablet 1 tab by mouth twice per day as needed 180 tablet 2   omeprazole (PRILOSEC) 40 MG capsule Take 40 mg by mouth daily.     rosuvastatin (CRESTOR) 20 MG tablet Take 1 tablet (20 mg total) by mouth daily. 90 tablet 3   vitamin B-12 (CYANOCOBALAMIN) 1000 MCG tablet Take 5,000 mcg by mouth daily.     No  facility-administered medications prior to visit.     Review of Systems:   Constitutional: No weight loss or gain, night sweats, fevers, chills, or lassitude. +daytime fatigue (baseline) HEENT: No headaches, difficulty swallowing, tooth/dental problems, or sore throat. No sneezing, itching, ear ache. +post nasal drip (improved) CV:  No chest pain, orthopnea, PND, swelling in lower extremities, anasarca, dizziness, palpitations, syncope Resp: No shortness of breath with exertion or at rest.  No cough. No hemoptysis. No wheezing.  No chest wall deformity GI:  No heartburn, indigestion, abdominal pain, nausea, vomiting, diarrhea, change in bowel habits, loss of appetite, bloody stools.  GU: No dysuria, change in color of urine, urgency or frequency.  Skin: No rash, lesions, ulcerations MSK:  No joint pain or swelling.   Neuro: No dizziness or lightheadedness.  Psych: No depression or anxiety. Mood stable.  Physical Exam:  BP 128/64 (BP Location: Right Arm, Patient Position: Sitting, Cuff Size: Normal)   Pulse 94   Temp 98.2 F (36.8 C) (Oral)   Ht 5\' 7"  (1.702 m)   Wt 231 lb 12.8 oz (105.1 kg)   SpO2 95%   BMI 36.31 kg/m   GEN: Pleasant, interactive, well-appearing; in no acute distress. HEENT:  Normocephalic and atraumatic. PERRLA. Sclera white. Nasal turbinates pink, moist and patent bilaterally. No rhinorrhea present. Oropharynx pink and moist, without exudate or edema. No lesions, ulcerations NECK:  Supple w/ fair ROM. No JVD present. Normal carotid impulses w/o bruits. Thyroid symmetrical with no goiter or nodules palpated. No lymphadenopathy.   CV: RRR, no m/r/g, no peripheral edema. Pulses intact, +2 bilaterally. No cyanosis, pallor or clubbing. PULMONARY:  Unlabored, regular breathing. Clear bilaterally A&P w/o wheezes/rales/rhonchi. No accessory muscle use.  GI: BS present and normoactive. Soft, non-tender to palpation. No organomegaly or masses detected.  MSK: No  erythema, warmth or tenderness. Cap refil <2 sec all extrem. No deformities or joint swelling noted.  Neuro: A/Ox3. No focal deficits noted.   Skin: Warm, no lesions or rashe Psych: Normal affect and behavior. Judgement and thought content appropriate.     Lab Results:  CBC    Component Value Date/Time   WBC 10.1 08/10/2022 1106   RBC 4.49 08/10/2022 1106   HGB 13.5 08/10/2022 1106   HCT 39.6 08/10/2022 1106   PLT 246.0 08/10/2022 1106   MCV 88.2 08/10/2022 1106   MCH 30.0 08/07/2022 1753   MCHC 34.0 08/10/2022 1106   RDW 13.1 08/10/2022 1106   LYMPHSABS 2.8 08/10/2022 1106   MONOABS 0.6 08/10/2022 1106   EOSABS 0.2 08/10/2022 1106   BASOSABS 0.1 08/10/2022 1106    BMET    Component Value Date/Time   NA 142 10/26/2022 1101   K 4.5 10/26/2022 1101   CL 104 10/26/2022 1101   CO2 25 10/26/2022 1101   GLUCOSE 88 10/26/2022 1101   GLUCOSE 108 (H) 08/10/2022 1106   BUN 20 10/26/2022 1101   CREATININE 0.89 10/26/2022 1101   CREATININE 0.98 08/06/2020 1203   CALCIUM 9.7 10/26/2022 1101   GFRNONAA >60 08/07/2022 1753   GFRNONAA 60 08/06/2020 1203   GFRAA 70 08/06/2020 1203    BNP No results found for: "BNP"   Imaging:  No results found.       Latest Ref Rng & Units 10/11/2022   10:19 AM  PFT Results  FVC-Pre L 2.87   FVC-Predicted Pre % 84   FVC-Post L 2.83   FVC-Predicted Post % 83   Pre FEV1/FVC % % 84   Post FEV1/FCV % % 90   FEV1-Pre L 2.42   FEV1-Predicted Pre % 93   FEV1-Post L 2.54     No results found for: "NITRICOXIDE"      Assessment & Plan:   Asthmatic bronchitis, mild intermittent, with acute exacerbation Asthmatic bronchitis with recent exacerbation. She has persistent elevation in her FeNO today. Given her clinical improvement, would hold off on further steroids. She will start the ICS inhaler that I previously prescribed. Again reviewed side effect profile. Suspect there is an allergic trigger as well; recommended she start daily  antihistamine for trigger prevention. Action plan in place.  Patient Instructions  Continue Albuterol inhaler 2 puffs every 6 hours as needed for shortness of breath or wheezing. Notify if symptoms persist despite rescue inhaler/neb use.  Start Pulmicort 1 puff Twice daily. Brush tongue and rinse mouth afterwards  Start daily allergy pill such as zyrtec, claritin or xyzal    Attend appointment with orthodontics    Follow up in 8 weeks with Dr. Elsworth Soho or Alanson Aly. If symptoms do not improve or worsen, please contact office for sooner follow up or seek emergency care.   OSA on CPAP Mild OSA with AHI 12.7/h. She is awaiting appointment with Dr. Toy Cookey from orthodontics for oral appliance fitting. If this is not affordable for her, she will restart CPAP therapy and we will trial alternative mask options. Cautioned on safe driving practices.     I spent 35 minutes of dedicated to the care of this patient on the date of this encounter to include pre-visit review of records, face-to-face time with the patient discussing conditions above, post visit ordering of testing, clinical documentation with the electronic health record, making appropriate referrals as documented, and communicating necessary findings to members of the patients care team.  Clayton Bibles, NP 01/30/2023  Pt aware and understands NP's role.

## 2023-01-30 NOTE — Assessment & Plan Note (Addendum)
Asthmatic bronchitis with recent exacerbation. She has persistent elevation in her FeNO today. Given her clinical improvement, would hold off on further steroids. She will start the ICS inhaler that I previously prescribed. Again reviewed side effect profile. Suspect there is an allergic trigger as well; recommended she start daily antihistamine for trigger prevention. Action plan in place.  Patient Instructions  Continue Albuterol inhaler 2 puffs every 6 hours as needed for shortness of breath or wheezing. Notify if symptoms persist despite rescue inhaler/neb use.  Start Pulmicort 1 puff Twice daily. Brush tongue and rinse mouth afterwards  Start daily allergy pill such as zyrtec, claritin or xyzal    Attend appointment with orthodontics    Follow up in 8 weeks with Dr. Elsworth Soho or Alanson Aly. If symptoms do not improve or worsen, please contact office for sooner follow up or seek emergency care.

## 2023-01-30 NOTE — Patient Instructions (Addendum)
Continue Albuterol inhaler 2 puffs every 6 hours as needed for shortness of breath or wheezing. Notify if symptoms persist despite rescue inhaler/neb use.  Start Pulmicort 1 puff Twice daily. Brush tongue and rinse mouth afterwards  Start daily allergy pill such as zyrtec, claritin or xyzal    Attend appointment with orthodontics    Follow up in 8 weeks with Dr. Elsworth Soho or Alanson Aly. If symptoms do not improve or worsen, please contact office for sooner follow up or seek emergency care.

## 2023-02-08 ENCOUNTER — Other Ambulatory Visit: Payer: Self-pay

## 2023-02-08 ENCOUNTER — Encounter: Payer: Self-pay | Admitting: Internal Medicine

## 2023-02-08 ENCOUNTER — Ambulatory Visit: Payer: Medicare Other | Attending: Internal Medicine | Admitting: Internal Medicine

## 2023-02-08 VITALS — BP 118/70 | HR 75 | Ht 67.0 in | Wt 228.6 lb

## 2023-02-08 DIAGNOSIS — Z79899 Other long term (current) drug therapy: Secondary | ICD-10-CM | POA: Diagnosis not present

## 2023-02-08 DIAGNOSIS — E785 Hyperlipidemia, unspecified: Secondary | ICD-10-CM | POA: Insufficient documentation

## 2023-02-08 DIAGNOSIS — R9431 Abnormal electrocardiogram [ECG] [EKG]: Secondary | ICD-10-CM | POA: Diagnosis not present

## 2023-02-08 DIAGNOSIS — I700 Atherosclerosis of aorta: Secondary | ICD-10-CM | POA: Insufficient documentation

## 2023-02-08 DIAGNOSIS — K219 Gastro-esophageal reflux disease without esophagitis: Secondary | ICD-10-CM | POA: Diagnosis not present

## 2023-02-08 DIAGNOSIS — G4733 Obstructive sleep apnea (adult) (pediatric): Secondary | ICD-10-CM | POA: Insufficient documentation

## 2023-02-08 DIAGNOSIS — R072 Precordial pain: Secondary | ICD-10-CM | POA: Insufficient documentation

## 2023-02-08 MED ORDER — ROSUVASTATIN CALCIUM 10 MG PO TABS: 10.0000 mg | ORAL_TABLET | Freq: Every day | ORAL | 3 refills | Status: DC

## 2023-02-08 MED ORDER — EZETIMIBE 10 MG PO TABS: 10.0000 mg | ORAL_TABLET | Freq: Every day | ORAL | 3 refills | Status: DC

## 2023-02-08 NOTE — Progress Notes (Addendum)
Cardiology Office Note:    Date: 02/08/2023  ID:  [name], DOB Mar 02, 1951, MRN [id number]  PCP:  Biagio Borg, MD  Cardiologist:  None  Electrophysiologist:  None   Referring MD: Biagio Borg, MD   Chief Complaint/Reason for Referral: Chest pain  History of Present Illness:    [name] is a 72 y.o. female with a history of hyperlipidemia, and sleep apnea who presents for follow-up today. She was initially seen 10/26/2022 for the evaluation of chest pain.   She presented to the ED 08/07/2022 with complaints of right sided chest pain. She reported the sensation like "spasms" that would come and go at random. Troponins were negative x2. No new EKG changes. She was discharged and recommended PCP follow up.   At her initial visit, she described right sided chest pain that would come and go, and then was constant for a couple hours prior to her ED visit. Since then, she only experienced short episodes of chest pain every once in a while. She used a CPAP in the past and was pending a sleep study.   We obtained an echocardiogram 11/2022 showing LVEF 55-60% and normal diastolic parameters. No mitral valve prolapse. Her CCTA 11/2022 revealed mild atherosclerotic plaque in the proximal OM2 with 25-49% stenosis. Coronary calcium score was 21.7 (56th percentile). Patent foramen ovale with left to right shunt. With her LDL of 105, we increased her rosuvastatin to 20 mg daily.  Today, she states she is doing well aside from some myalgias and arthralgias. Her pain is especially noticeable in the joints of her fingers and her left ankle. Her symptoms correlated with the increase of rosuvastatin to 20 mg.  She denies any further chest discomfort. Currently she is off prednisone.  She did complete her sleep study and it was suggested that she resume her CPAP with a full mask (previously nasal) or follow up with her dentist to pursue oral device.  At this time she is not taking her Pulmicort as the  instructions note to not take with certain medical history. She plans to review with pulmonary.   She denies any palpitations, shortness of breath, or peripheral edema. No lightheadedness, headaches, syncope, orthopnea, or PND.   Past Medical History:  Diagnosis Date   Abnormal Pap smear of vagina 06/17/15   LGSIL   Allergic rhinitis, cause unspecified    Arthritis    ARTHRITIS, KNEE 02/10/2009   Qualifier: Diagnosis of  By: Niel Hummer MD, Willie R    BRCA negative 03/25/13   Cervical cancer 1984   Cervical disc disease 02/11/2012   Chronic lower back pain    Degenerative arthritis of hip 02/11/2012   Diverticulosis 02/11/2012   GERD 08/10/2007   Qualifier: Diagnosis of  By: Sherlynn Stalls, CMA, Cindy     GERD (gastroesophageal reflux disease)    GERD (gastroesophageal reflux disease)    Hemorrhoids    HEMORRHOIDS-INTERNAL 07/21/2010   Qualifier: Diagnosis of  By: Chester Holstein NP, Paula     Hiatal hernia    Hyperlipidemia 02/13/2012   Mitral valve prolapse    no cardiologist, mild   OAB (overactive bladder)    Obesity    OVERACTIVE BLADDER 08/10/2007   Qualifier: Diagnosis of  By: Sherlynn Stalls, CMA, Cindy     Pneumonia 2017   PONV (postoperative nausea and vomiting)    yrs ago unable to raise arms after female surgery, no recent problems   Recurrent UTI    Sleep apnea    uses  cpap set on 2   SUI (stress urinary incontinence, female)    Urinary incontinence    VAGINITIS, ATROPHIC 11/11/2008   Qualifier: Diagnosis of  By: Niel Hummer MD, Lorinda Creed     Past Surgical History:  Procedure Laterality Date   bladder tack     x 3 or 4   BUNIONECTOMY Right 2007   CERVICAL DISCECTOMY  2002   fusion and plate C 3   CHOLECYSTECTOMY N/A 08/13/2013   Procedure: LAPAROSCOPIC CHOLECYSTECTOMY WITH INTRAOPERATIVE CHOLANGIOGRAM;  Surgeon: Shann Medal, MD;  Location: [facility];  Service: General;  Laterality: N/A;   CLOSED REDUCTION METATARSAL FRACTURE     rt side 06-03-18, left side 08-19-17   COLONOSCOPY  2018    conization of cervix  1982   with D&C   CYSTOCELE REPAIR  2003   rectocele repair   EYE SURGERY Bilateral yrs ago   lasik   ORIF ANKLE FRACTURE Right 08/20/2017   Procedure: OPEN REDUCTION INTERNAL FIXATION (ORIF) ANKLE FRACTURE;  Surgeon: Meredith Pel, MD;  Location: [facility];  Service: Orthopedics;  Laterality: Right;   SHOULDER ARTHROSCOPY Right 2004   frozen shoulder   TONSILLECTOMY  as child   adenoids removed   TRANSANAL HEMORRHOIDAL DEARTERIALIZATION N/A 07/30/2020   Procedure: TRANSANAL HEMORRHOIDAL DEARTERIALIZATION;  Surgeon: Leighton Ruff, MD;  Location: [facility];  Service: General;  Laterality: N/A;   VAGINAL HYSTERECTOMY  1986   partial    Current Medications: Current Meds  Medication Sig   albuterol (VENTOLIN HFA) 108 (90 Base) MCG/ACT inhaler Inhale 2 puffs into the lungs every 6 (six) hours as needed for wheezing or shortness of breath.   benzonatate (TESSALON) 200 MG capsule Take 1 capsule (200 mg total) by mouth 3 (three) times daily as needed for cough.   Budesonide (PULMICORT FLEXHALER) 90 MCG/ACT inhaler Inhale 1 puff into the lungs 2 (two) times daily.   calcium carbonate (OSCAL) 1500 (600 Ca) MG TABS tablet Take 600 mg of elemental calcium by mouth daily.   Cholecalciferol (VITAMIN D) 125 MCG (5000 UT) CAPS Take 125 mcg by mouth daily.   ezetimibe (ZETIA) 10 MG tablet Take 1 tablet (10 mg total) by mouth daily.   fluticasone (FLONASE) 50 MCG/ACT nasal spray Place 2 sprays into both nostrils daily. (Patient taking differently: Place 2 sprays into both nostrils as needed for allergies.)   gabapentin (NEURONTIN) 100 MG capsule Take 1 capsule (100 mg total) by mouth 3 (three) times daily.   MULTIPLE VITAMIN tablet Take 1 tablet by mouth daily.   nabumetone (RELAFEN) 750 MG tablet 1 tab by mouth twice per day as needed   omeprazole (PRILOSEC) 40 MG capsule Take 40 mg by mouth daily.   vitamin B-12 (CYANOCOBALAMIN) 1000 MCG tablet Take 5,000 mcg  by mouth daily.   [DISCONTINUED] rosuvastatin (CRESTOR) 20 MG tablet Take 1 tablet (20 mg total) by mouth daily.     Allergies:   Amoxicillin and Penicillins   Social History   Tobacco Use   Smoking status: Never    Passive exposure: Past   Smokeless tobacco: Never  Vaping Use   Vaping Use: Never used  Substance Use Topics   Alcohol use: Yes    Alcohol/week: 0.0 standard drinks of alcohol    Comment: occ wine   Drug use: No     Family History: The patient's family history includes Breast cancer in her cousin, mother, and sister; Cancer in her maternal grandmother; Diabetes in her brother, maternal  aunt, and maternal uncle; Heart disease in her brother and sister; Thyroid cancer in an other family member. There is no history of Colon cancer, Esophageal cancer, Liver cancer, Pancreatic cancer, Rectal cancer, or Stomach cancer.  ROS:   Please see the history of present illness.   (+) Myalgias (+) Arthralgias All other systems reviewed and are negative.  EKGs/Labs/Other Studies Reviewed:    The following studies were reviewed today:  Coronary CTA  11/24/2022: IMPRESSION: 1.  No acute findings in the imaged extracardiac chest. 2.  Aortic Atherosclerosis (ICD10-I70.0). 3. Esophageal air fluid level suggests dysmotility or gastroesophageal reflux.  Echocardiogram  11/23/2022:  1. Left ventricular ejection fraction, by estimation, is 55 to 60%. The  left ventricle has normal function. The left ventricle has no regional  wall motion abnormalities. Left ventricular diastolic parameters were  normal.   2. Right ventricular systolic function is normal. The right ventricular  size is normal.   3. The mitral valve is normal in structure. No evidence of mitral valve  regurgitation. No evidence of mitral stenosis.   4. The aortic valve is grossly normal. Aortic valve regurgitation is not  visualized. No aortic stenosis is present.   5. The inferior vena cava is normal in size with  greater than 50%  respiratory variability, suggesting right atrial pressure of 3 mmHg.   Comparison(s): No prior Echocardiogram.   Conclusion(s)/Recommendation(s): Normal biventricular function without  evidence of hemodynamically significant valvular heart disease.    EKG: EKG is personally reviewed. 02/08/2023:  Not ordered. 10/26/22: Sinus rhythm. Rate 74 bpm. Low voltage QRS. Nonspecific ST wave abnormalities.   Imaging studies that I have independently reviewed today: CT cor cal scoring - 0 cor cals. 09/25/2019.  Recent Labs: 08/10/2022: ALT 16; Hemoglobin 13.5; Platelets 246.0; TSH 1.56 10/26/2022: BUN 20; Creatinine, Ser 0.89; Potassium 4.5; Sodium 142   Recent Lipid Panel    Component Value Date/Time   CHOL 178 08/10/2022 1106   TRIG 122.0 08/10/2022 1106   HDL 48.60 08/10/2022 1106   CHOLHDL 4 08/10/2022 1106   VLDL 24.4 08/10/2022 1106   LDLCALC 105 (H) 08/10/2022 1106   LDLCALC 104 (H) 08/06/2020 1204   LDLDIRECT 135.1 05/08/2012 0834    Physical Exam:    VS:  BP 118/70   Pulse 75   Ht 5\' 7"  (1.702 m)   Wt 228 lb 9.6 oz (103.7 kg)   SpO2 95%   BMI 35.80 kg/m     Wt Readings from Last 5 Encounters:  02/08/23 228 lb 9.6 oz (103.7 kg)  01/30/23 231 lb 12.8 oz (105.1 kg)  01/04/23 224 lb 1.6 oz (101.7 kg)  10/26/22 221 lb (100.2 kg)  10/11/22 222 lb 6.4 oz (100.9 kg)    Constitutional: No acute distress Eyes: sclera non-icteric, normal conjunctiva and lids ENMT: normal dentition, moist mucous membranes Cardiovascular: regular rhythm, normal rate, no murmur. S1 and S2 normal. No jugular venous distention.  Respiratory: clear to auscultation bilaterally GI : normal bowel sounds, soft and nontender. No distention.   MSK: extremities warm, well perfused. No edema.  NEURO: grossly nonfocal exam, moves all extremities. PSYCH: alert and oriented x 3, normal mood and affect.   ASSESSMENT:    1. Hyperlipidemia, unspecified hyperlipidemia type   2. Precordial  pain   3. Nonspecific abnormal electrocardiogram (ECG) (EKG)   4. Aortic atherosclerosis   5. OSA on CPAP   6. Gastroesophageal reflux disease, unspecified whether esophagitis present     PLAN:    Precordial pain -  Nonspecific abnormal electrocardiogram (ECG) (EKG) - Aortic atherosclerosis (HCC) - echo grossly normal - ccta with mild disease.  - no recurrence of chest pain. Observe.   OSA on CPAP - continue evaluation, may require dental appliance vs full face mask.    Hyperlipidemia, unspecified hyperlipidemia type - LDL above goal of <70. Mild CAD. Is not tolerating crestor 20 mg daily due to myalgias. Reduce to 10 mg daily and add zetia 10 mg daily.   Follow up:  In 6 months.   Total time of encounter: 25 minutes total time of encounter, including 15 minutes spent in face-to-face patient care on the date of this encounter. This time includes coordination of care and counseling regarding above mentioned problem list. Remainder of non-face-to-face time involved reviewing chart documents/testing relevant to the patient encounter and documentation in the medical record. I have independently reviewed documentation from referring provider.   Weston Brass, MD, [facility]  [facility]   Shared Decision Making/Informed Consent:      Medication Adjustments/Labs and Tests Ordered: Current medicines are reviewed at length with the patient today.  Concerns regarding medicines are outlined above.   Orders Placed This Encounter  Procedures   Lipid panel   Meds ordered this encounter  Medications   rosuvastatin (CRESTOR) 10 MG tablet    Sig: Take 1 tablet (10 mg total) by mouth daily.    Dispense:  90 tablet    Refill:  3   ezetimibe (ZETIA) 10 MG tablet    Sig: Take 1 tablet (10 mg total) by mouth daily.    Dispense:  90 tablet    Refill:  3   Patient Instructions  Medication Instructions:   START: ROSUVASTATIN 10MG  ONCE DAILY   START: ZETIA (EZETIMIBE) 10MG   ONCE DAILY   *If you need a refill on your cardiac medications before your next appointment, please call your pharmacy*  Lab Work: BLOOD WORK TODAY-   Please return for FASTING Blood Work in 3-6 MONTHS. No appointment needed, lab here at the office is open Monday-Friday from 8AM to 4PM and closed daily for lunch from 12:45-1:45.   If you have labs (blood work) drawn today and your tests are completely normal, you will receive your results only by: MyChart Message (if you have MyChart) OR A paper copy in the mail If you have any lab test that is abnormal or we need to change your treatment, we will call you to review the results.  Follow-Up: At [facility], you and your health needs are our priority.  As part of our continuing mission to provide you with exceptional heart care, we have created designated Provider Care Teams.  These Care Teams include your primary Cardiologist (physician) and Advanced Practice Providers (APPs -  Physician Assistants and Nurse Practitioners) who all work together to provide you with the care you need, when you need it.  Your next appointment:   6 month(s)  Provider:   Dr. Derrell Lolling Stumpf,acting as a scribe for Parke Poisson, MD.,have documented all relevant documentation on the behalf of Parke Poisson, MD,as directed by  Parke Poisson, MD while in the presence of Parke Poisson, MD.  I, Parke Poisson, MD, have reviewed all documentation for this visit. The documentation on 02/08/23 for the exam, diagnosis, procedures, and orders are all accurate and complete.  ADDENDUM: 03/06/23  Recommendations: continue current therapy.  This is due to findings on plaque report as noted below:  Labs: 02/08/23 LDL 69, HDL 58, Trig 103  Total plaque volume on coronary CTA 71 mm3, this is categorized as mild plaque volume, with 96% of plaque documented as soft plaque and 3% calcified plaque. 4% of soft plaque is low attenuation.  Recommendation is to continue current therapy for crestor 10 mg daily and zetia 10 mg daily, change made on 4/3. -------------------------------------------- Patient had plaque data provided as part of DECIDE registry from coronary CTA, management decisions as follows:  Did you add a medication? No  If yes, how many? 0  If no, reason? Reason for not adding med: Other already adjusted therapy  Did you remove a medication? No  Did you increase the dosage of any medication? No  Did you decrease the dosage of any medication? No  Did you refer to a specialist (i.e. lipid clinic, preventive cardiology, endocrinology)? No  Has patient seen plaque report? No

## 2023-02-08 NOTE — Patient Instructions (Signed)
Medication Instructions:   START: ROSUVASTATIN 10MG  ONCE DAILY   START: ZETIA (EZETIMIBE) 10MG  ONCE DAILY   *If you need a refill on your cardiac medications before your next appointment, please call your pharmacy*  Lab Work: BLOOD WORK TODAY-   Please return for FASTING Blood Work in 3-6 MONTHS. No appointment needed, lab here at the office is open Monday-Friday from 8AM to 4PM and closed daily for lunch from 12:45-1:45.   If you have labs (blood work) drawn today and your tests are completely normal, you will receive your results only by: [place] (if you have MyChart) OR A paper copy in the mail If you have any lab test that is abnormal or we need to change your treatment, we will call you to review the results.  Follow-Up: At [facility], you and your health needs are our priority.  As part of our continuing mission to provide you with exceptional heart care, we have created designated Provider Care Teams.  These Care Teams include your primary Cardiologist (physician) and Advanced Practice Providers (APPs -  Physician Assistants and Nurse Practitioners) who all work together to provide you with the care you need, when you need it.  Your next appointment:   6 month(s)  Provider:   Dr. Margaretann Loveless

## 2023-02-09 LAB — LIPID PANEL
Chol/HDL Ratio: 2.5 ratio (ref 0.0–4.4)
Cholesterol, Total: 146 mg/dL (ref 100–199)
HDL: 58 mg/dL (ref >39)
LDL Chol Calc (NIH): 69 mg/dL (ref 0–99)
Triglycerides: 103 mg/dL (ref 0–149)
VLDL Cholesterol Cal: 19 mg/dL (ref 5–40)

## 2023-02-09 LAB — HEPATIC FUNCTION PANEL
ALT: 14 IU/L (ref 0–32)
AST: 17 IU/L (ref 0–40)
Albumin: 4.1 g/dL (ref 3.9–4.9)
Alkaline Phosphatase: 103 IU/L (ref 44–121)
Bilirubin Total: 0.5 mg/dL (ref 0.0–1.2)
Bilirubin, Direct: 0.16 mg/dL (ref 0.00–0.40)
Total Protein: 6.5 g/dL (ref 6.0–8.5)

## 2023-03-15 ENCOUNTER — Telehealth: Payer: Self-pay | Admitting: Internal Medicine

## 2023-03-15 NOTE — Telephone Encounter (Signed)
Pt returning Jenna's call regarding results and is requesting a callback. Please advise

## 2023-03-15 NOTE — Telephone Encounter (Signed)
  Labs look excellent. Let's stick with our plan to decrease rosuvastatin to 10 mg daily because of symptoms, and add zetia 10 mg daily. Since labs look so good, please plan to repeat in 3-6 months when you choose, either with my office or your primary care doctor.    Returned pt's call and gave the above message. She reports that she has an appointment with her PCP in October, she will get labs drawn at that time. She said PCP is within the system, so will be able to see results.   She verbalized understanding.

## 2023-03-27 ENCOUNTER — Encounter: Payer: Self-pay | Admitting: Nurse Practitioner

## 2023-03-27 ENCOUNTER — Ambulatory Visit (INDEPENDENT_AMBULATORY_CARE_PROVIDER_SITE_OTHER): Payer: Medicare Other | Admitting: Nurse Practitioner

## 2023-03-27 VITALS — BP 116/78 | HR 82 | Temp 98.6°F | Ht 67.0 in | Wt 232.8 lb

## 2023-03-27 DIAGNOSIS — G4733 Obstructive sleep apnea (adult) (pediatric): Secondary | ICD-10-CM

## 2023-03-27 DIAGNOSIS — J309 Allergic rhinitis, unspecified: Secondary | ICD-10-CM | POA: Diagnosis not present

## 2023-03-27 DIAGNOSIS — J4520 Mild intermittent asthma, uncomplicated: Secondary | ICD-10-CM

## 2023-03-27 NOTE — Patient Instructions (Addendum)
Restart CPAP 5-15 cmH2O every night, minimum of 4-6 hours a night.  Change equipment every 30 days or as directed by DME. Wash your tubing with warm soap and water daily, hang to dry. Wash humidifier portion weekly.  Be aware of reduced alertness and do not drive or operate heavy machinery if experiencing this or drowsiness.  Exercise encouraged, as tolerated. Notify if persistent daytime sleepiness occurs even with consistent use of CPAP.  Call [employer] to schedule a time to take your machine up there to make sure everything is set appropriately and you have everything you need  Orders sent for new supplies  We discussed how untreated sleep apnea puts an individual at risk for cardiac arrhthymias, pulm HTN, DM, stroke and increases their risk for daytime accidents.   Continue Albuterol inhaler 2 puffs every 6 hours as needed for shortness of breath or wheezing. Notify if symptoms persist despite rescue inhaler/neb use.  Continue Pulmicort 1 puff Twice daily. Brush tongue and rinse mouth afterwards  Continue daily allergy pill such as zyrtec, claritin or xyzal   Follow up in 12 weeks with Dr. Vassie Loll or Philis Nettle. Bring SD card. If symptoms do not improve or worsen, please contact office for sooner follow up or seek emergency care.

## 2023-03-27 NOTE — Progress Notes (Signed)
@Patient  ID: [name], female    DOB: 1950-11-12, 72 y.o.   MRN: [id number]  Chief Complaint  Patient presents with   Follow-up    Pt f/u on cpap. Need of supplies.     Referring provider: Corwin Levins, MD  HPI: 72 year old female, never smoker followed for mild OSA and reactive airway disease. She is a patient of Dr. Reginia Naas and last seen in office 01/30/2023 by [facility] NP. Past medical history significant for GERD, rhinitis, obesity, HLD, vit d deficiency.   TEST/EVENTS:  10/12/2023 PFT: FVC 84, FEV1 93, ratio 90. No BD; did have midflow reversibility 11/03/2022 HST: AHI 12.7/h, SpO2 low 78% 01/04/2023 FeNO 46 ppb  10/11/2022: OV with Dr. Vassie Loll. Seen in the past for OSA, maintained on auto CPAP. Lost 10 pounds since diagnosis. Previous use of CPAP with recurrent sinus infections, despite alternative masks. HST ordered. Onset of cough second week of October with white phlegm, no DOE. Given albuterol MDI without relief. CXR clear with persistent elevated right hemidiaphragm. Saw PCP 11/27 and given depo medrol and prednisone course, which has improved symptoms and cough almost resolved. PFTs ordered.   01/04/2023: OV with Manvir Prabhu NP for follow up; however, she is also struggling with some acute symptoms. She tells me that around 1-2 months ago, she developed nasal congestion and drainage. She thought it was just allergies. Symptoms have persisted since and she has now developed an associated cough. Cough is dry during the day but productive in AM. Phlegm is yellow in color. She does notice a lot of postnasal drip. She does not feel like she is necessarily more short winded. Denies any fevers, chills, hemoptysis, wheezing, sinus tenderness, headaches. She has not use her albuterol inhaler. She is not doing any nasal sprays/over the counter medications. She was started on CPAP after her last visit. Doesn't feel like she can tolerate it. She feels like the mask makes her claustrophobic. She would prefer  to try an oral appliance. Denies drowsy driving.   02/13/8118: OV with Jerri Hargadon NP for follow up. She is feeling much better after her last visit. Cough and nasal symptoms are resolved. She has some occasional throat clearing but feels like her postnasal drainage has improved. Breathing feels like it's at her baseline. She has not started the Pulmicort inhaler. She has not required her rescue.  She did get a call from Dr. Alveda Reasons office about setting an appointment for the oral appliance. She has to call them back to schedule this for treatment of her OSA.  03/27/2023: Today - follow up Patient presents today for follow up.  She ended up deciding against the oral appliance due to cost.  She would like to go ahead and move forward with CPAP therapy instead.  She does have a machine at home which she stopped using in 2020.  She thinks that it is about 17 or 70 years old but was only used for about 2 years.  She would like to see if she could restart therapy with this and just get new supplies for her.  She had trouble with her mask previously, which was her primary problem.  She also could not tolerate nasal masks due to worsening sinus symptoms.  She would like to retry a fullface mask of sorts.  She is tired during the day.  Feels like her sleep quality at night is not great.  Denies any drowsy driving or morning headaches. She does have some postnasal drip but stopped  taking her allergy medicine a few weeks ago.  She plans to restart it as this was helping her a lot.  Cough has not returned.  She is not having any chest congestion, wheezing or shortness of breath.  Allergies  Allergen Reactions   Amoxicillin     REACTION: unspecified   Penicillins Other (See Comments)    Unknown reaction, mother had reaction almost died    Immunization History  Administered Date(s) Administered   Fluad Quad(high Dose 65+) 08/02/2019, 08/06/2020, 08/09/2021, 08/10/2022   Influenza Split 09/12/2012   Influenza Whole  10/03/2007, 08/16/2010   Influenza,inj,Quad PF,6+ Mos 08/07/2013, 07/18/2014, 07/21/2015, 07/21/2016, 07/28/2017, 07/31/2018   PFIZER(Purple Top)SARS-COV-2 Vaccination 12/13/2019, 01/07/2020, 10/12/2020   Pneumococcal Conjugate-13 08/19/2019   Td 09/08/2003   Tdap 03/20/2014    Past Medical History:  Diagnosis Date   Abnormal Pap smear of vagina 06/17/15   LGSIL   Allergic rhinitis, cause unspecified    Arthritis    ARTHRITIS, KNEE 02/10/2009   Qualifier: Diagnosis of  By: Alphonzo Severance MD, Willie R    BRCA negative 03/25/13   Cervical cancer (HCC) 1984   Cervical disc disease 02/11/2012   Chronic lower back pain    Degenerative arthritis of hip 02/11/2012   Diverticulosis 02/11/2012   GERD 08/10/2007   Qualifier: Diagnosis of  By: Linna Darner, CMA, Cindy     GERD (gastroesophageal reflux disease)    GERD (gastroesophageal reflux disease)    Hemorrhoids    HEMORRHOIDS-INTERNAL 07/21/2010   Qualifier: Diagnosis of  By: Wilmon Pali NP, Paula     Hiatal hernia    Hyperlipidemia 02/13/2012   Mitral valve prolapse    no cardiologist, mild   OAB (overactive bladder)    Obesity    OVERACTIVE BLADDER 08/10/2007   Qualifier: Diagnosis of  By: Linna Darner, CMA, Cindy     Pneumonia 2017   PONV (postoperative nausea and vomiting)    yrs ago unable to raise arms after female surgery, no recent problems   Recurrent UTI    Sleep apnea    uses cpap set on 2   SUI (stress urinary incontinence, female)    Urinary incontinence    VAGINITIS, ATROPHIC 11/11/2008   Qualifier: Diagnosis of  By: Alphonzo Severance MD, Loni Dolly     Tobacco History: Social History   Tobacco Use  Smoking Status Never   Passive exposure: Past  Smokeless Tobacco Never   Counseling given: Not Answered   Outpatient Medications Prior to Visit  Medication Sig Dispense Refill   albuterol (VENTOLIN HFA) 108 (90 Base) MCG/ACT inhaler Inhale 2 puffs into the lungs every 6 (six) hours as needed for wheezing or shortness of breath. 8 g 2    benzonatate (TESSALON) 200 MG capsule Take 1 capsule (200 mg total) by mouth 3 (three) times daily as needed for cough. 30 capsule 1   Budesonide (PULMICORT FLEXHALER) 90 MCG/ACT inhaler Inhale 1 puff into the lungs 2 (two) times daily. 1 each 5   calcium carbonate (OSCAL) 1500 (600 Ca) MG TABS tablet Take 600 mg of elemental calcium by mouth daily.     Cholecalciferol (VITAMIN D) 125 MCG (5000 UT) CAPS Take 125 mcg by mouth daily.     ezetimibe (ZETIA) 10 MG tablet Take 1 tablet (10 mg total) by mouth daily. 90 tablet 3   fluticasone (FLONASE) 50 MCG/ACT nasal spray Place 2 sprays into both nostrils daily. (Patient taking differently: Place 2 sprays into both nostrils as needed for allergies.) 18.2 mL 2  gabapentin (NEURONTIN) 100 MG capsule Take 1 capsule (100 mg total) by mouth 3 (three) times daily. 90 capsule 3   MULTIPLE VITAMIN tablet Take 1 tablet by mouth daily.     nabumetone (RELAFEN) 750 MG tablet 1 tab by mouth twice per day as needed 180 tablet 2   omeprazole (PRILOSEC) 40 MG capsule Take 40 mg by mouth daily.     rosuvastatin (CRESTOR) 10 MG tablet Take 1 tablet (10 mg total) by mouth daily. 90 tablet 3   vitamin B-12 (CYANOCOBALAMIN) 1000 MCG tablet Take 5,000 mcg by mouth daily.     No facility-administered medications prior to visit.     Review of Systems:   Constitutional: No weight loss or gain, night sweats, fevers, chills, or lassitude. +daytime fatigue  HEENT: No headaches, difficulty swallowing, tooth/dental problems, or sore throat. No sneezing, itching, ear ache. +post nasal drip  CV:  No chest pain, orthopnea, PND, swelling in lower extremities, anasarca, dizziness, palpitations, syncope Resp: No shortness of breath with exertion or at rest.  No cough. No hemoptysis. No wheezing.  No chest wall deformity GI:  No heartburn, indigestion, abdominal pain, nausea, vomiting, diarrhea, change in bowel habits, loss of appetite, bloody stools.  GU: No dysuria, change in  color of urine, urgency or frequency.  Skin: No rash, lesions, ulcerations MSK:  No joint pain or swelling.   Neuro: No dizziness or lightheadedness.  Psych: No depression or anxiety. Mood stable.     Physical Exam:  BP 116/78   Pulse 82   Temp 98.6 F (37 C) (Oral)   Ht 5\' 7"  (1.702 m)   Wt 232 lb 12.8 oz (105.6 kg)   SpO2 96%   BMI 36.46 kg/m   GEN: Pleasant, interactive, well-appearing; in no acute distress. HEENT:  Normocephalic and atraumatic. PERRLA. Sclera white. Nasal turbinates pink, moist and patent bilaterally. No rhinorrhea present. Oropharynx pink and moist, without exudate or edema. No lesions, ulcerations NECK:  Supple w/ fair ROM. No JVD present. Normal carotid impulses w/o bruits. Thyroid symmetrical with no goiter or nodules palpated. No lymphadenopathy.   CV: RRR, no m/r/g, no peripheral edema. Pulses intact, +2 bilaterally. No cyanosis, pallor or clubbing. PULMONARY:  Unlabored, regular breathing. Clear bilaterally A&P w/o wheezes/rales/rhonchi. No accessory muscle use.  GI: BS present and normoactive. Soft, non-tender to palpation. No organomegaly or masses detected.  MSK: No erythema, warmth or tenderness. Cap refil <2 sec all extrem. No deformities or joint swelling noted.  Neuro: A/Ox3. No focal deficits noted.   Skin: Warm, no lesions or rashe Psych: Normal affect and behavior. Judgement and thought content appropriate.     Lab Results:  CBC    Component Value Date/Time   WBC 10.1 08/10/2022 1106   RBC 4.49 08/10/2022 1106   HGB 13.5 08/10/2022 1106   HCT 39.6 08/10/2022 1106   PLT 246.0 08/10/2022 1106   MCV 88.2 08/10/2022 1106   MCH 30.0 08/07/2022 1753   MCHC 34.0 08/10/2022 1106   RDW 13.1 08/10/2022 1106   LYMPHSABS 2.8 08/10/2022 1106   MONOABS 0.6 08/10/2022 1106   EOSABS 0.2 08/10/2022 1106   BASOSABS 0.1 08/10/2022 1106    BMET    Component Value Date/Time   NA 142 10/26/2022 1101   K 4.5 10/26/2022 1101   CL 104 10/26/2022  1101   CO2 25 10/26/2022 1101   GLUCOSE 88 10/26/2022 1101   GLUCOSE 108 (H) 08/10/2022 1106   BUN 20 10/26/2022 1101   CREATININE 0.89  10/26/2022 1101   CREATININE 0.98 08/06/2020 1203   CALCIUM 9.7 10/26/2022 1101   GFRNONAA >60 08/07/2022 1753   GFRNONAA 60 08/06/2020 1203   GFRAA 70 08/06/2020 1203    BNP No results found for: "BNP"   Imaging:  No results found.       Latest Ref Rng & Units 10/11/2022   10:19 AM  PFT Results  FVC-Pre L 2.87   FVC-Predicted Pre % 84   FVC-Post L 2.83   FVC-Predicted Post % 83   Pre FEV1/FVC % % 84   Post FEV1/FCV % % 90   FEV1-Pre L 2.42   FEV1-Predicted Pre % 93   FEV1-Post L 2.54     No results found for: "NITRICOXIDE"      Assessment & Plan:   Mild obstructive sleep apnea Mild OSA. She previously had trouble tolerating CPAP. She would like to retry with different mask. She has a working machine at home, which she will take to [facility] to ensure settings are appropriate. We will restart her on 5-15 cmH2O with DreamWear full face mask. Encouraged her to contact us if she is still having difficulties and we can refer her to have a mask desensitization study. Understands proper use/care of device. Cautioned on safe driving practices.  Patient Instructions  Restart CPAP 5-15 cmH2O every night, minimum of 4-6 hours a night.  Change equipment every 30 days or as directed by DME. Wash your tubing with warm soap and water daily, hang to dry. Wash humidifier portion weekly.  Be aware of reduced alertness and do not drive or operate heavy machinery if experiencing this or drowsiness.  Exercise encouraged, as tolerated. Notify if persistent daytime sleepiness occurs even with consistent use of CPAP.  Call [facility] to schedule a time to take your machine up there to make sure everything is set appropriately and you have everything you need  Orders sent for new supplies  We discussed how untreated sleep apnea puts an individual at risk  for cardiac arrhthymias, pulm HTN, DM, stroke and increases their risk for daytime accidents.   Continue Albuterol inhaler 2 puffs every 6 hours as needed for shortness of breath or wheezing. Notify if symptoms persist despite rescue inhaler/neb use.  Continue Pulmicort 1 puff Twice daily. Brush tongue and rinse mouth afterwards  Continue daily allergy pill such as zyrtec, claritin or xyzal   Follow up in 12 weeks with Dr. Vassie Loll or Philis Nettle. Bring SD card. If symptoms do not improve or worsen, please contact office for sooner follow up or seek emergency care.    Allergic rhinitis Slight increase. She will restart daily antihistamine and notify if symptoms do not improve.   Asthmatic bronchitis, mild intermittent, uncomplicated Compensated on current regimen. Last exacerbation was in February 2024. Action plan in place.      I spent 35 minutes of dedicated to the care of this patient on the date of this encounter to include pre-visit review of records, face-to-face time with the patient discussing conditions above, post visit ordering of testing, clinical documentation with the electronic health record, making appropriate referrals as documented, and communicating necessary findings to members of the patients care team.  Noemi Chapel, NP 03/27/2023  Pt aware and understands NP's role.

## 2023-03-27 NOTE — Assessment & Plan Note (Signed)
Mild OSA. She previously had trouble tolerating CPAP. She would like to retry with different mask. She has a working machine at home, which she will take to [facility] to ensure settings are appropriate. We will restart her on 5-15 cmH2O with DreamWear full face mask. Encouraged her to contact us if she is still having difficulties and we can refer her to have a mask desensitization study. Understands proper use/care of device. Cautioned on safe driving practices.  Patient Instructions  Restart CPAP 5-15 cmH2O every night, minimum of 4-6 hours a night.  Change equipment every 30 days or as directed by DME. Wash your tubing with warm soap and water daily, hang to dry. Wash humidifier portion weekly.  Be aware of reduced alertness and do not drive or operate heavy machinery if experiencing this or drowsiness.  Exercise encouraged, as tolerated. Notify if persistent daytime sleepiness occurs even with consistent use of CPAP.  Call [facility] to schedule a time to take your machine up there to make sure everything is set appropriately and you have everything you need  Orders sent for new supplies  We discussed how untreated sleep apnea puts an individual at risk for cardiac arrhthymias, pulm HTN, DM, stroke and increases their risk for daytime accidents.   Continue Albuterol inhaler 2 puffs every 6 hours as needed for shortness of breath or wheezing. Notify if symptoms persist despite rescue inhaler/neb use.  Continue Pulmicort 1 puff Twice daily. Brush tongue and rinse mouth afterwards  Continue daily allergy pill such as zyrtec, claritin or xyzal   Follow up in 12 weeks with Dr. Vassie Loll or Philis Nettle. Bring SD card. If symptoms do not improve or worsen, please contact office for sooner follow up or seek emergency care.

## 2023-03-27 NOTE — Assessment & Plan Note (Signed)
Slight increase. She will restart daily antihistamine and notify if symptoms do not improve.

## 2023-03-27 NOTE — Assessment & Plan Note (Signed)
Compensated on current regimen. Last exacerbation was in February 2024. Action plan in place.

## 2023-04-12 ENCOUNTER — Encounter: Payer: Self-pay | Admitting: Family Medicine

## 2023-04-12 ENCOUNTER — Ambulatory Visit (INDEPENDENT_AMBULATORY_CARE_PROVIDER_SITE_OTHER): Payer: Medicare Other | Admitting: Family Medicine

## 2023-04-12 VITALS — BP 118/78 | HR 82 | Temp 97.6°F | Ht 67.0 in | Wt 230.0 lb

## 2023-04-12 DIAGNOSIS — R350 Frequency of micturition: Secondary | ICD-10-CM

## 2023-04-12 DIAGNOSIS — N3001 Acute cystitis with hematuria: Secondary | ICD-10-CM

## 2023-04-12 LAB — POC URINALSYSI DIPSTICK (AUTOMATED)
Bilirubin, UA: NEGATIVE
Glucose, UA: NEGATIVE
Ketones, UA: NEGATIVE
Nitrite, UA: NEGATIVE
Protein, UA: NEGATIVE
Spec Grav, UA: 1.01 (ref 1.010–1.025)
Urobilinogen, UA: 0.2 E.U./dL
pH, UA: 6 (ref 5.0–8.0)

## 2023-04-12 MED ORDER — NITROFURANTOIN MONOHYD MACRO 100 MG PO CAPS: 100.0000 mg | ORAL_CAPSULE | Freq: Two times a day (BID) | ORAL | 0 refills | Status: DC

## 2023-04-12 NOTE — Patient Instructions (Signed)
Hydrate and take the antibiotic as prescribed.   We will be in touch with your urine culture result.

## 2023-04-12 NOTE — Progress Notes (Signed)
Subjective:  [name] is a 72 y.o. female who complains of possible urinary tract infection.  She has had symptoms for 1 week.  Symptoms include  urinary urgency, dysuria,  . Patient denies  fever, chills, abdominal pain, back pain, N/V .  Using nothing for current symptoms.    Leaving for a cruise tomorrow.   Patient does not have a history of recurrent UTI. Patient does not have a history of pyelonephritis.  No other aggravating or relieving factors.  No other c/o.  Past Medical History:  Diagnosis Date   Abnormal Pap smear of vagina 06/17/15   LGSIL   Allergic rhinitis, cause unspecified    Arthritis    ARTHRITIS, KNEE 02/10/2009   Qualifier: Diagnosis of  By: Alphonzo Severance MD, Willie R    BRCA negative 03/25/13   Cervical cancer (HCC) 1984   Cervical disc disease 02/11/2012   Chronic lower back pain    Degenerative arthritis of hip 02/11/2012   Diverticulosis 02/11/2012   GERD 08/10/2007   Qualifier: Diagnosis of  By: Linna Darner, CMA, Cindy     GERD (gastroesophageal reflux disease)    GERD (gastroesophageal reflux disease)    Hemorrhoids    HEMORRHOIDS-INTERNAL 07/21/2010   Qualifier: Diagnosis of  By: Wilmon Pali NP, Paula     Hiatal hernia    Hyperlipidemia 02/13/2012   Mitral valve prolapse    no cardiologist, mild   OAB (overactive bladder)    Obesity    OVERACTIVE BLADDER 08/10/2007   Qualifier: Diagnosis of  By: Linna Darner, CMA, Cindy     Pneumonia 2017   PONV (postoperative nausea and vomiting)    yrs ago unable to raise arms after female surgery, no recent problems   Recurrent UTI    Sleep apnea    uses cpap set on 2   SUI (stress urinary incontinence, female)    Urinary incontinence    VAGINITIS, ATROPHIC 11/11/2008   Qualifier: Diagnosis of  By: Alphonzo Severance MD, Willie R     ROS as in subjective  Reviewed allergies, medications, past medical, surgical, and social history.    Objective: Vitals:   04/12/23 1403  BP: 118/78  Pulse: 82  Temp: 97.6 F (36.4 C)  SpO2: 97%     General appearance: alert, no distress, WD/WN, female Abdomen:  soft, non tender, non distended Back: no CVA tenderness     Laboratory:  Urine dipstick: trace for hemoglobin and 3+ for leukocyte esterase.       Assessment: Acute cystitis with hematuria - Plan: Urine Culture, Urine Culture, nitrofurantoin, macrocrystal-monohydrate, (MACROBID) 100 MG capsule  Urinary frequency - Plan: POCT Urinalysis Dipstick (Automated)   Plan: Discussed symptoms, diagnosis, possible complications, and usual course of illness.  Macrobid prescribed.  Advised increased water intake, can use OTC Tylenol for pain.        Urine culture sent.   Call or return if worse or not improving.

## 2023-04-14 LAB — URINE CULTURE

## 2023-07-21 ENCOUNTER — Other Ambulatory Visit: Payer: Self-pay

## 2023-07-21 ENCOUNTER — Other Ambulatory Visit: Payer: Self-pay | Admitting: Nurse Practitioner

## 2023-07-21 ENCOUNTER — Other Ambulatory Visit: Payer: Self-pay | Admitting: Internal Medicine

## 2023-07-21 DIAGNOSIS — J019 Acute sinusitis, unspecified: Secondary | ICD-10-CM

## 2023-08-03 DIAGNOSIS — Z1231 Encounter for screening mammogram for malignant neoplasm of breast: Secondary | ICD-10-CM | POA: Diagnosis not present

## 2023-08-03 LAB — HM MAMMOGRAPHY

## 2023-08-07 ENCOUNTER — Encounter: Payer: Self-pay | Admitting: Internal Medicine

## 2023-08-15 ENCOUNTER — Encounter: Payer: Self-pay | Admitting: Internal Medicine

## 2023-08-15 ENCOUNTER — Ambulatory Visit (INDEPENDENT_AMBULATORY_CARE_PROVIDER_SITE_OTHER): Payer: Medicare Other | Admitting: Internal Medicine

## 2023-08-15 VITALS — BP 124/78 | HR 70 | Temp 98.4°F | Ht 67.0 in | Wt 229.0 lb

## 2023-08-15 DIAGNOSIS — E559 Vitamin D deficiency, unspecified: Secondary | ICD-10-CM | POA: Diagnosis not present

## 2023-08-15 DIAGNOSIS — Z23 Encounter for immunization: Secondary | ICD-10-CM | POA: Diagnosis not present

## 2023-08-15 DIAGNOSIS — E7800 Pure hypercholesterolemia, unspecified: Secondary | ICD-10-CM | POA: Diagnosis not present

## 2023-08-15 DIAGNOSIS — E538 Deficiency of other specified B group vitamins: Secondary | ICD-10-CM | POA: Diagnosis not present

## 2023-08-15 DIAGNOSIS — R7302 Impaired glucose tolerance (oral): Secondary | ICD-10-CM

## 2023-08-15 DIAGNOSIS — J309 Allergic rhinitis, unspecified: Secondary | ICD-10-CM | POA: Diagnosis not present

## 2023-08-15 LAB — CBC WITH DIFFERENTIAL/PLATELET
Basophils Absolute: 0 10*3/uL (ref 0.0–0.1)
Basophils Relative: 0.5 % (ref 0.0–3.0)
Eosinophils Absolute: 0.4 10*3/uL (ref 0.0–0.7)
Eosinophils Relative: 3.7 % (ref 0.0–5.0)
HCT: 41.8 % (ref 36.0–46.0)
Hemoglobin: 13.5 g/dL (ref 12.0–15.0)
Lymphocytes Relative: 36 % (ref 12.0–46.0)
Lymphs Abs: 3.5 10*3/uL (ref 0.7–4.0)
MCHC: 32.3 g/dL (ref 30.0–36.0)
MCV: 89.2 fL (ref 78.0–100.0)
Monocytes Absolute: 0.6 10*3/uL (ref 0.1–1.0)
Monocytes Relative: 5.9 % (ref 3.0–12.0)
Neutro Abs: 5.2 10*3/uL (ref 1.4–7.7)
Neutrophils Relative %: 53.9 % (ref 43.0–77.0)
Platelets: 255 10*3/uL (ref 150.0–400.0)
RBC: 4.69 Mil/uL (ref 3.87–5.11)
RDW: 13.5 % (ref 11.5–15.5)
WBC: 9.7 10*3/uL (ref 4.0–10.5)

## 2023-08-15 LAB — BASIC METABOLIC PANEL
BUN: 18 mg/dL (ref 6–23)
CO2: 28 meq/L (ref 19–32)
Calcium: 9.7 mg/dL (ref 8.4–10.5)
Chloride: 104 meq/L (ref 96–112)
Creatinine, Ser: 0.9 mg/dL (ref 0.40–1.20)
GFR: 65.07 mL/min (ref >60.00)
Glucose, Bld: 92 mg/dL (ref 70–99)
Potassium: 4 meq/L (ref 3.5–5.1)
Sodium: 141 meq/L (ref 135–145)

## 2023-08-15 LAB — URINALYSIS, ROUTINE W REFLEX MICROSCOPIC
Bilirubin Urine: NEGATIVE
Hgb urine dipstick: NEGATIVE
Ketones, ur: NEGATIVE
Nitrite: POSITIVE — AB
RBC / HPF: NONE SEEN (ref 0–?)
Specific Gravity, Urine: 1.02 (ref 1.000–1.030)
Total Protein, Urine: NEGATIVE
Urine Glucose: NEGATIVE
Urobilinogen, UA: 0.2 (ref 0.0–1.0)
pH: 6 (ref 5.0–8.0)

## 2023-08-15 LAB — HEPATIC FUNCTION PANEL
ALT: 13 U/L (ref 0–35)
AST: 17 U/L (ref 0–37)
Albumin: 4 g/dL (ref 3.5–5.2)
Alkaline Phosphatase: 72 U/L (ref 39–117)
Bilirubin, Direct: 0.2 mg/dL (ref 0.0–0.3)
Total Bilirubin: 0.7 mg/dL (ref 0.2–1.2)
Total Protein: 6.8 g/dL (ref 6.0–8.3)

## 2023-08-15 LAB — LIPID PANEL
Cholesterol: 120 mg/dL (ref 0–200)
HDL: 47.9 mg/dL (ref >39.00)
LDL Cholesterol: 46 mg/dL (ref 0–99)
NonHDL: 71.73
Total CHOL/HDL Ratio: 2
Triglycerides: 130 mg/dL (ref 0.0–149.0)
VLDL: 26 mg/dL (ref 0.0–40.0)

## 2023-08-15 LAB — TSH: TSH: 2.83 u[IU]/mL (ref 0.35–5.50)

## 2023-08-15 LAB — MICROALBUMIN / CREATININE URINE RATIO
Creatinine,U: 138.7 mg/dL
Microalb Creat Ratio: 0.7 mg/g (ref 0.0–30.0)
Microalb, Ur: 0.9 mg/dL (ref 0.0–1.9)

## 2023-08-15 LAB — VITAMIN B12: Vitamin B-12: 1494 pg/mL — ABNORMAL HIGH (ref 211–911)

## 2023-08-15 LAB — VITAMIN D 25 HYDROXY (VIT D DEFICIENCY, FRACTURES): VITD: 71 ng/mL (ref 30.00–100.00)

## 2023-08-15 LAB — HEMOGLOBIN A1C: Hgb A1c MFr Bld: 6 % (ref 4.6–6.5)

## 2023-08-15 MED ORDER — NABUMETONE 750 MG PO TABS: ORAL_TABLET | ORAL | 2 refills | Status: DC

## 2023-08-15 NOTE — Progress Notes (Signed)
The test results show that your current treatment is OK, as the tests are stable.  Please continue the same plan.  There is no other need for change of treatment or further evaluation based on these results, at this time.  thanks 

## 2023-08-15 NOTE — Patient Instructions (Signed)
You had the flu shot today, and the Prevnar 20 pneumonia shot today  Please continue all other medications as before, and refills have been done if requested.  Please have the pharmacy call with any other refills you may need.  Please continue your efforts at being more active, low cholesterol diet, and weight control.  You are otherwise up to date with prevention measures today.  Please keep your appointments with your specialists as you may have planned  Please go to the LAB at the blood drawing area for the tests to be done  You will be contacted by phone if any changes need to be made immediately.  Otherwise, you will receive a letter about your results with an explanation, but please check with MyChart first.  Please make an Appointment to return in 6 months, or sooner if needed

## 2023-08-15 NOTE — Progress Notes (Unsigned)
Patient ID: [name], female   DOB: 06-24-1951, 72 y.o.   MRN: [id number]        Chief Complaint: follow up allergies, hld, hyperglycemia, low vit d       HPI:  [name] is a 72 y.o. female here overall doing ok.  Pt denies chest pain, increased sob or doe, wheezing, orthopnea, PND, increased LE swelling, palpitations, dizziness or syncope.   Pt denies polydipsia, polyuria, or new focal neuro s/s.    Pt denies fever, wt loss, night sweats, loss of appetite, or other constitutional symptoms  Due for flu shot and Prevnar 20 today.  Does have several wks ongoing nasal allergy symptoms with clearish congestion, itch and sneezing, without fever, pain, ST, cough, swelling or wheezing. Wt Readings from Last 3 Encounters:  08/15/23 229 lb (103.9 kg)  04/12/23 230 lb (104.3 kg)  03/27/23 232 lb 12.8 oz (105.6 kg)   BP Readings from Last 3 Encounters:  08/15/23 124/78  04/12/23 118/78  03/27/23 116/78         Past Medical History:  Diagnosis Date   Abnormal Pap smear of vagina 06/17/15   LGSIL   Allergic rhinitis, cause unspecified    Arthritis    ARTHRITIS, KNEE 02/10/2009   Qualifier: Diagnosis of  By: Alphonzo Severance MD, Willie R    BRCA negative 03/25/13   Cervical cancer (HCC) 1984   Cervical disc disease 02/11/2012   Chronic lower back pain    Degenerative arthritis of hip 02/11/2012   Diverticulosis 02/11/2012   GERD 08/10/2007   Qualifier: Diagnosis of  By: Linna Darner, CMA, Cindy     GERD (gastroesophageal reflux disease)    GERD (gastroesophageal reflux disease)    Hemorrhoids    HEMORRHOIDS-INTERNAL 07/21/2010   Qualifier: Diagnosis of  By: Wilmon Pali NP, Paula     Hiatal hernia    Hyperlipidemia 02/13/2012   Mitral valve prolapse    no cardiologist, mild   OAB (overactive bladder)    Obesity    OVERACTIVE BLADDER 08/10/2007   Qualifier: Diagnosis of  By: Linna Darner, CMA, Cindy     Pneumonia 2017   PONV (postoperative nausea and vomiting)    yrs ago unable to raise arms after female surgery,  no recent problems   Recurrent UTI    Sleep apnea    uses cpap set on 2   SUI (stress urinary incontinence, female)    Urinary incontinence    VAGINITIS, ATROPHIC 11/11/2008   Qualifier: Diagnosis of  By: Alphonzo Severance MD, Loni Dolly    Past Surgical History:  Procedure Laterality Date   bladder tack     x 3 or 4   BUNIONECTOMY Right 2007   CERVICAL DISCECTOMY  2002   fusion and plate C 3   CHOLECYSTECTOMY N/A 08/13/2013   Procedure: LAPAROSCOPIC CHOLECYSTECTOMY WITH INTRAOPERATIVE CHOLANGIOGRAM;  Surgeon: Kandis Cocking, MD;  Location: [facility];  Service: General;  Laterality: N/A;   CLOSED REDUCTION METATARSAL FRACTURE     rt side 06-03-18, left side 08-19-17   COLONOSCOPY  2018   conization of cervix  1982   with D&C   CYSTOCELE REPAIR  2003   rectocele repair   EYE SURGERY Bilateral yrs ago   lasik   ORIF ANKLE FRACTURE Right 08/20/2017   Procedure: OPEN REDUCTION INTERNAL FIXATION (ORIF) ANKLE FRACTURE;  Surgeon: Cammy Copa, MD;  Location: [facility];  Service: Orthopedics;  Laterality: Right;   SHOULDER ARTHROSCOPY Right 2004   frozen shoulder  TONSILLECTOMY  as child   adenoids removed   TRANSANAL HEMORRHOIDAL DEARTERIALIZATION N/A 07/30/2020   Procedure: TRANSANAL HEMORRHOIDAL DEARTERIALIZATION;  Surgeon: Romie Levee, MD;  Location: [facility];  Service: General;  Laterality: N/A;   VAGINAL HYSTERECTOMY  1986   partial    reports that she has never smoked. She has been exposed to tobacco smoke. She has never used smokeless tobacco. She reports current alcohol use. She reports that she does not use drugs. family history includes Breast cancer in her cousin, mother, and sister; Cancer in her maternal grandmother; Diabetes in her brother, maternal aunt, and maternal uncle; Heart disease in her brother and sister; Thyroid cancer in an other family member. Allergies  Allergen Reactions   Amoxicillin     REACTION: unspecified   Clavulanic Acid     Penicillins Other (See Comments)    Unknown reaction, mother had reaction almost died   Current Outpatient Medications on File Prior to Visit  Medication Sig Dispense Refill   albuterol (VENTOLIN HFA) 108 (90 Base) MCG/ACT inhaler Inhale 2 puffs into the lungs every 6 (six) hours as needed for wheezing or shortness of breath. 8 g 2   Budesonide (PULMICORT FLEXHALER) 90 MCG/ACT inhaler Inhale 1 puff into the lungs 2 (two) times daily. 1 each 5   calcium carbonate (OSCAL) 1500 (600 Ca) MG TABS tablet Take 600 mg of elemental calcium by mouth daily.     Cholecalciferol (VITAMIN D) 125 MCG (5000 UT) CAPS Take 125 mcg by mouth daily.     ezetimibe (ZETIA) 10 MG tablet Take 1 tablet (10 mg total) by mouth daily. 90 tablet 3   fluticasone (FLONASE) 50 MCG/ACT nasal spray SPRAY 2 SPRAYS INTO EACH NOSTRIL EVERY DAY 48 mL 3   gabapentin (NEURONTIN) 100 MG capsule TAKE 1 CAPSULE (100 MG TOTAL) BY MOUTH THREE TIMES DAILY. 90 capsule 3   MULTIPLE VITAMIN tablet Take 1 tablet by mouth daily.     omeprazole (PRILOSEC) 40 MG capsule Take 40 mg by mouth daily.     rosuvastatin (CRESTOR) 10 MG tablet Take 1 tablet (10 mg total) by mouth daily. 90 tablet 3   vitamin B-12 (CYANOCOBALAMIN) 1000 MCG tablet Take 5,000 mcg by mouth daily.     No current facility-administered medications on file prior to visit.        ROS:  All others reviewed and negative.  Objective        PE:  BP 124/78 (BP Location: Right Arm, Patient Position: Sitting, Cuff Size: Normal)   Pulse 70   Temp 98.4 F (36.9 C) (Oral)   Ht 5\' 7"  (1.702 m)   Wt 229 lb (103.9 kg)   SpO2 98%   BMI 35.87 kg/m                 Constitutional: Pt appears in NAD               HENT: Head: NCAT.                Right Ear: External ear normal.                 Left Ear: External ear normal.  Bilat tm's with mild erythema.  Max sinus areas non tender.  Pharynx with mild erythema, no exudate               Eyes: . Pupils are equal, round, and reactive to  light. Conjunctivae and EOM are normal  Nose: without d/c or deformity               Neck: Neck supple. Gross normal ROM               Cardiovascular: Normal rate and regular rhythm.                 Pulmonary/Chest: Effort normal and breath sounds without rales or wheezing.                Abd:  Soft, NT, ND, + BS, no organomegaly               Neurological: Pt is alert. At baseline orientation, motor grossly intact               Skin: Skin is warm. No rashes, no other new lesions, LE edema - none               Psychiatric: Pt behavior is normal without agitation   Micro: none  Cardiac tracings I have personally interpreted today:  none  Pertinent Radiological findings (summarize): none   Lab Results  Component Value Date   WBC 9.7 08/15/2023   HGB 13.5 08/15/2023   HCT 41.8 08/15/2023   PLT 255.0 08/15/2023   GLUCOSE 92 08/15/2023   CHOL 120 08/15/2023   TRIG 130.0 08/15/2023   HDL 47.90 08/15/2023   LDLDIRECT 135.1 05/08/2012   LDLCALC 46 08/15/2023   ALT 13 08/15/2023   AST 17 08/15/2023   NA 141 08/15/2023   K 4.0 08/15/2023   CL 104 08/15/2023   CREATININE 0.90 08/15/2023   BUN 18 08/15/2023   CO2 28 08/15/2023   TSH 2.83 08/15/2023   HGBA1C 6.0 08/15/2023   MICROALBUR 0.9 08/15/2023   Assessment/Plan:  [name] is a 72 y.o. White or Caucasian [1] female with  has a past medical history of Abnormal Pap smear of vagina (06/17/15), Allergic rhinitis, cause unspecified, Arthritis, ARTHRITIS, KNEE (02/10/2009), BRCA negative (03/25/13), Cervical cancer (HCC) (1984), Cervical disc disease (02/11/2012), Chronic lower back pain, Degenerative arthritis of hip (02/11/2012), Diverticulosis (02/11/2012), GERD (08/10/2007), GERD (gastroesophageal reflux disease), GERD (gastroesophageal reflux disease), Hemorrhoids, HEMORRHOIDS-INTERNAL (07/21/2010), Hiatal hernia, Hyperlipidemia (02/13/2012), Mitral valve prolapse, OAB (overactive bladder), Obesity, OVERACTIVE BLADDER (08/10/2007),  Pneumonia (2017), PONV (postoperative nausea and vomiting), Recurrent UTI, Sleep apnea, SUI (stress urinary incontinence, female), Urinary incontinence, and VAGINITIS, ATROPHIC (11/11/2008).  Allergic rhinitis Mild to mod, for restart flonase asd,  to f/u any worsening symptoms or concerns  Hyperlipidemia Lab Results  Component Value Date   LDLCALC 46 08/15/2023   Stable, pt to continue current statin Zetia 10 every day, crestor 10 qd   Impaired glucose tolerance Lab Results  Component Value Date   HGBA1C 6.0 08/15/2023   Stable, pt to continue current medical treatment  - diet, wt control   Vitamin D deficiency Last vitamin D Lab Results  Component Value Date   VD25OH 71.00 08/15/2023   Stable, cont oral replacement  Followup: Return in about 6 months (around 02/13/2024).  Oliver Barre, MD 08/17/2023 9:42 AM [facility]

## 2023-08-17 ENCOUNTER — Encounter: Payer: Self-pay | Admitting: Internal Medicine

## 2023-08-17 NOTE — Assessment & Plan Note (Signed)
Lab Results  Component Value Date   HGBA1C 6.0 08/15/2023   Stable, pt to continue current medical treatment  - diet, wt control

## 2023-08-17 NOTE — Assessment & Plan Note (Signed)
Mild to mod, for restart flonase asd, to f/u any worsening symptoms or concerns

## 2023-08-17 NOTE — Assessment & Plan Note (Signed)
Last vitamin D Lab Results  Component Value Date   VD25OH 71.00 08/15/2023   Stable, cont oral replacement

## 2023-08-17 NOTE — Assessment & Plan Note (Signed)
Lab Results  Component Value Date   LDLCALC 46 08/15/2023   Stable, pt to continue current statin Zetia 10 every day, crestor 10 qd

## 2023-08-30 DIAGNOSIS — M5412 Radiculopathy, cervical region: Secondary | ICD-10-CM | POA: Diagnosis not present

## 2023-08-30 DIAGNOSIS — Z6836 Body mass index (BMI) 36.0-36.9, adult: Secondary | ICD-10-CM | POA: Diagnosis not present

## 2023-08-30 DIAGNOSIS — M47812 Spondylosis without myelopathy or radiculopathy, cervical region: Secondary | ICD-10-CM | POA: Diagnosis not present

## 2023-09-01 ENCOUNTER — Telehealth (INDEPENDENT_AMBULATORY_CARE_PROVIDER_SITE_OTHER): Payer: Medicare Other | Admitting: Internal Medicine

## 2023-09-01 DIAGNOSIS — J309 Allergic rhinitis, unspecified: Secondary | ICD-10-CM | POA: Diagnosis not present

## 2023-09-01 DIAGNOSIS — K2970 Gastritis, unspecified, without bleeding: Secondary | ICD-10-CM

## 2023-09-01 DIAGNOSIS — J019 Acute sinusitis, unspecified: Secondary | ICD-10-CM

## 2023-09-01 DIAGNOSIS — B9681 Helicobacter pylori [H. pylori] as the cause of diseases classified elsewhere: Secondary | ICD-10-CM | POA: Diagnosis not present

## 2023-09-01 DIAGNOSIS — E559 Vitamin D deficiency, unspecified: Secondary | ICD-10-CM

## 2023-09-01 DIAGNOSIS — R350 Frequency of micturition: Secondary | ICD-10-CM

## 2023-09-01 MED ORDER — MECLIZINE HCL 12.5 MG PO TABS: 12.5000 mg | ORAL_TABLET | Freq: Three times a day (TID) | ORAL | 1 refills | Status: AC | PRN

## 2023-09-01 MED ORDER — LEVOFLOXACIN 500 MG PO TABS: 500.0000 mg | ORAL_TABLET | Freq: Every day | ORAL | 0 refills | Status: DC

## 2023-09-01 MED ORDER — PREDNISONE 10 MG PO TABS: ORAL_TABLET | ORAL | 0 refills | Status: DC

## 2023-09-01 NOTE — Progress Notes (Unsigned)
Patient ID: [name], female   DOB: 1951/07/30, 72 y.o.   MRN: [id number]  Virtual Visit via Video Note  I connected with [name] on 09/01/23 at  3:20 PM EDT by a video enabled telemedicine application and verified that I am speaking with the correct person using two identifiers.  Location: Patient: *** Provider: ***   I discussed the limitations of evaluation and management by telemedicine and the availability of in person appointments. The patient expressed understanding and agreed to proceed.  History of Present Illness:    Observations/Objective:   Assessment and Plan:   Follow Up Instructions:    I discussed the assessment and treatment plan with the patient. The patient was provided an opportunity to ask questions and all were answered. The patient agreed with the plan and demonstrated an understanding of the instructions.   The patient was advised to call back or seek an in-person evaluation if the symptoms worsen or if the condition fails to improve as anticipated.   Oliver Barre, MD

## 2023-09-03 ENCOUNTER — Encounter: Payer: Self-pay | Admitting: Internal Medicine

## 2023-09-03 NOTE — Patient Instructions (Signed)
Please take all new medication as prescribed 

## 2023-09-03 NOTE — Assessment & Plan Note (Signed)
Mild to mod, for prednisone taper  to f/u any worsening symptoms or concerns

## 2023-09-03 NOTE — Assessment & Plan Note (Signed)
Last vitamin D Lab Results  Component Value Date   VD25OH 71.00 08/15/2023   Stable, cont oral replacement

## 2023-09-03 NOTE — Assessment & Plan Note (Signed)
Mild to mod, possible uti, for antibx course as already tx for sinusitis - levaquin,  to f/u any worsening symptoms or concerns

## 2023-09-03 NOTE — Assessment & Plan Note (Signed)
Mild to mod, for antibx course levaquin 500 qd  to f/u any worsening symptoms or concerns

## 2023-09-06 ENCOUNTER — Other Ambulatory Visit: Payer: Self-pay | Admitting: Neurological Surgery

## 2023-09-06 DIAGNOSIS — M47812 Spondylosis without myelopathy or radiculopathy, cervical region: Secondary | ICD-10-CM

## 2023-09-08 ENCOUNTER — Telehealth: Payer: Self-pay | Admitting: Internal Medicine

## 2023-09-08 NOTE — Telephone Encounter (Signed)
H. Pylori test was ordered for the patient to be sent to [facility]. Patient is currently there and was told they do not have the order. The order is in the system and was ordered on 09/01/2023. Please advise. Patient would like a call back at [phone].

## 2023-09-08 NOTE — Telephone Encounter (Signed)
It sounds like pt needs to have the Stool test itself taken at our lab, then sent to [facility]    thanks

## 2023-09-14 ENCOUNTER — Other Ambulatory Visit: Payer: Self-pay | Admitting: Internal Medicine

## 2023-09-14 DIAGNOSIS — H53143 Visual discomfort, bilateral: Secondary | ICD-10-CM | POA: Diagnosis not present

## 2023-09-14 DIAGNOSIS — H25013 Cortical age-related cataract, bilateral: Secondary | ICD-10-CM | POA: Diagnosis not present

## 2023-09-14 NOTE — Telephone Encounter (Signed)
Called and Iet Pt know.

## 2023-09-15 NOTE — Discharge Instructions (Signed)

## 2023-09-18 ENCOUNTER — Ambulatory Visit
Admission: RE | Admit: 2023-09-18 | Discharge: 2023-09-18 | Disposition: A | Discharge status: Home / Self Care | Payer: Medicare Other | Source: Ambulatory Visit | Attending: Neurological Surgery | Admitting: Neurological Surgery

## 2023-09-18 DIAGNOSIS — R200 Anesthesia of skin: Secondary | ICD-10-CM | POA: Diagnosis not present

## 2023-09-18 DIAGNOSIS — M47812 Spondylosis without myelopathy or radiculopathy, cervical region: Secondary | ICD-10-CM

## 2023-09-18 MED ORDER — IOPAMIDOL (ISOVUE-M 300) INJECTION 61%
1.0000 mL | Freq: Once | INTRAMUSCULAR | Status: AC | PRN
  Administered 2023-09-18: 1 mL via EPIDURAL

## 2023-09-18 MED ORDER — TRIAMCINOLONE ACETONIDE 40 MG/ML IJ SUSP (RADIOLOGY)
60.0000 mg | Freq: Once | INTRAMUSCULAR | Status: AC
  Administered 2023-09-18: 60 mg via EPIDURAL

## 2023-09-19 DIAGNOSIS — L708 Other acne: Secondary | ICD-10-CM | POA: Diagnosis not present

## 2023-09-19 DIAGNOSIS — L814 Other melanin hyperpigmentation: Secondary | ICD-10-CM | POA: Diagnosis not present

## 2023-09-19 DIAGNOSIS — D2372 Other benign neoplasm of skin of left lower limb, including hip: Secondary | ICD-10-CM | POA: Diagnosis not present

## 2023-09-19 DIAGNOSIS — D485 Neoplasm of uncertain behavior of skin: Secondary | ICD-10-CM | POA: Diagnosis not present

## 2023-09-19 DIAGNOSIS — L821 Other seborrheic keratosis: Secondary | ICD-10-CM | POA: Diagnosis not present

## 2023-09-25 ENCOUNTER — Ambulatory Visit (INDEPENDENT_AMBULATORY_CARE_PROVIDER_SITE_OTHER): Payer: Medicare Other

## 2023-09-25 VITALS — Ht 67.0 in | Wt 229.0 lb

## 2023-09-25 DIAGNOSIS — Z0000 Encounter for general adult medical examination without abnormal findings: Secondary | ICD-10-CM

## 2023-09-25 NOTE — Patient Instructions (Signed)
Ms. Harvell , Thank you for taking time to come for your Medicare Wellness Visit. I appreciate your ongoing commitment to your health goals. Please review the following plan we discussed and let me know if I can assist you in the future.   Referrals/Orders/Follow-Ups/Clinician Recommendations: You are due for a Shingles vaccine.  It was nice talking with you today and keep up the good work.  This is a list of the screening recommended for you and due dates:  Health Maintenance  Topic Date Due   COVID-19 Vaccine (4 - 2023-24 season) 10/11/2023*   Zoster (Shingles) Vaccine (1 of 2) 12/08/2023*   DTaP/Tdap/Td vaccine (3 - Td or Tdap) 03/20/2024   Medicare Annual Wellness Visit  09/24/2024   Mammogram  08/02/2025   Colon Cancer Screening  07/12/2027   Pneumonia Vaccine  Completed   Flu Shot  Completed   DEXA scan (bone density measurement)  Completed   Hepatitis C Screening  Completed   HPV Vaccine  Aged Out  *Topic was postponed. The date shown is not the original due date.    Advanced directives: (Copy Requested) Please bring a copy of your health care power of attorney and living will to the office to be added to your chart at your convenience.  Next Medicare Annual Wellness Visit scheduled for next year: Yes

## 2023-09-25 NOTE — Progress Notes (Signed)
Subjective:   [name] is a 72 y.o. female who presents for Medicare Annual (Subsequent) preventive examination.  Visit Complete: Virtual I connected with  [name] on 09/25/23 by a audio enabled telemedicine application and verified that I am speaking with the correct person using two identifiers.  Patient Location: Home  Provider Location: Office/Clinic  I discussed the limitations of evaluation and management by telemedicine. The patient expressed understanding and agreed to proceed.  Vital Signs: Because this visit was a virtual/telehealth visit, some criteria may be missing or patient reported. Any vitals not documented were not able to be obtained and vitals that have been documented are patient reported.    Cardiac Risk Factors include: advanced age (>79men, >91 women);Other (see comment);dyslipidemia;obesity (BMI >30kg/m2), Risk factor comments: Aortic atherosclerosis, Mild OSA,     Objective:    Today's Vitals   09/25/23 1305  Weight: 229 lb (103.9 kg)  Height: 5\' 7"  (1.702 m)   Body mass index is 35.87 kg/m.     09/25/2023    1:09 PM 09/13/2022    3:03 PM 08/07/2022    5:50 PM 09/20/2021    9:35 AM 07/30/2020    7:02 AM 04/25/2020    1:10 PM 06/03/2018    7:32 PM  Advanced Directives  Does Patient Have a Medical Advance Directive? Yes Yes No Yes No Yes No  Type of Estate agent of [place];Living will Healthcare Power of [place];Living will  Healthcare Power of [place];Living will  Healthcare Power of [place];Living will   Does patient want to make changes to medical advance directive?  No - Patient declined    No - Patient declined   Copy of Healthcare Power of Attorney in Chart? No - copy requested No - copy requested    No - copy requested   Would patient like information on creating a medical advance directive?   No - Patient declined  No - Guardian declined  No - Patient declined    Current Medications (verified) Outpatient  Encounter Medications as of 09/25/2023  Medication Sig   albuterol (VENTOLIN HFA) 108 (90 Base) MCG/ACT inhaler Inhale 2 puffs into the lungs every 6 (six) hours as needed for wheezing or shortness of breath.   Budesonide (PULMICORT FLEXHALER) 90 MCG/ACT inhaler Inhale 1 puff into the lungs 2 (two) times daily.   calcium carbonate (OSCAL) 1500 (600 Ca) MG TABS tablet Take 600 mg of elemental calcium by mouth daily.   Cholecalciferol (VITAMIN D) 125 MCG (5000 UT) CAPS Take 125 mcg by mouth daily.   ezetimibe (ZETIA) 10 MG tablet Take 1 tablet (10 mg total) by mouth daily.   fluticasone (FLONASE) 50 MCG/ACT nasal spray SPRAY 2 SPRAYS INTO EACH NOSTRIL EVERY DAY   gabapentin (NEURONTIN) 100 MG capsule TAKE 1 CAPSULE (100 MG TOTAL) BY MOUTH THREE TIMES DAILY.   levofloxacin (LEVAQUIN) 500 MG tablet Take 1 tablet (500 mg total) by mouth daily.   meclizine (ANTIVERT) 12.5 MG tablet Take 1 tablet (12.5 mg total) by mouth 3 (three) times daily as needed.   MULTIPLE VITAMIN tablet Take 1 tablet by mouth daily.   nabumetone (RELAFEN) 750 MG tablet 1 tab by mouth twice per day as needed   omeprazole (PRILOSEC) 40 MG capsule Take 40 mg by mouth daily.   predniSONE (DELTASONE) 10 MG tablet 3 tabs by mouth per day for 3 days,2tabs per day for 3 days,1tab per day for 3 days   rosuvastatin (CRESTOR) 10 MG tablet Take  1 tablet (10 mg total) by mouth daily.   vitamin B-12 (CYANOCOBALAMIN) 1000 MCG tablet Take 5,000 mcg by mouth daily.   No facility-administered encounter medications on file as of 09/25/2023.    Allergies (verified) Amoxicillin, Clavulanic acid, and Penicillins   History: Past Medical History:  Diagnosis Date   Abnormal Pap smear of vagina 06/17/15   LGSIL   Allergic rhinitis, cause unspecified    Arthritis    ARTHRITIS, KNEE 02/10/2009   Qualifier: Diagnosis of  By: Alphonzo Severance MD, Willie R    BRCA negative 03/25/13   Cervical cancer (HCC) 1984   Cervical disc disease 02/11/2012    Chronic lower back pain    Degenerative arthritis of hip 02/11/2012   Diverticulosis 02/11/2012   GERD 08/10/2007   Qualifier: Diagnosis of  By: Linna Darner, CMA, Cindy     GERD (gastroesophageal reflux disease)    GERD (gastroesophageal reflux disease)    Hemorrhoids    HEMORRHOIDS-INTERNAL 07/21/2010   Qualifier: Diagnosis of  By: Wilmon Pali NP, Paula     Hiatal hernia    Hyperlipidemia 02/13/2012   Mitral valve prolapse    no cardiologist, mild   OAB (overactive bladder)    Obesity    OVERACTIVE BLADDER 08/10/2007   Qualifier: Diagnosis of  By: Linna Darner, CMA, Cindy     Pneumonia 2017   PONV (postoperative nausea and vomiting)    yrs ago unable to raise arms after female surgery, no recent problems   Recurrent UTI    Sleep apnea    uses cpap set on 2   SUI (stress urinary incontinence, female)    Urinary incontinence    VAGINITIS, ATROPHIC 11/11/2008   Qualifier: Diagnosis of  By: Alphonzo Severance MD, Loni Dolly    Past Surgical History:  Procedure Laterality Date   bladder tack     x 3 or 4   BUNIONECTOMY Right 2007   CERVICAL DISCECTOMY  2002   fusion and plate C 3   CHOLECYSTECTOMY N/A 08/13/2013   Procedure: LAPAROSCOPIC CHOLECYSTECTOMY WITH INTRAOPERATIVE CHOLANGIOGRAM;  Surgeon: Kandis Cocking, MD;  Location: [facility] OR;  Service: General;  Laterality: N/A;   CLOSED REDUCTION METATARSAL FRACTURE     rt side 06-03-18, left side 08-19-17   COLONOSCOPY  2018   conization of cervix  1982   with D&C   CYSTOCELE REPAIR  2003   rectocele repair   EYE SURGERY Bilateral yrs ago   lasik   ORIF ANKLE FRACTURE Right 08/20/2017   Procedure: OPEN REDUCTION INTERNAL FIXATION (ORIF) ANKLE FRACTURE;  Surgeon: Cammy Copa, MD;  Location: [facility];  Service: Orthopedics;  Laterality: Right;   SHOULDER ARTHROSCOPY Right 2004   frozen shoulder   TONSILLECTOMY  as child   adenoids removed   TRANSANAL HEMORRHOIDAL DEARTERIALIZATION N/A 07/30/2020   Procedure: TRANSANAL HEMORRHOIDAL DEARTERIALIZATION;   Surgeon: Romie Levee, MD;  Location: [facility];  Service: General;  Laterality: N/A;   VAGINAL HYSTERECTOMY  1986   partial   Family History  Problem Relation Age of Onset   Breast cancer Mother        dx in her last 18's   Heart disease Sister    Breast cancer Sister        dx in her late 11's   Diabetes Brother    Heart disease Brother        triple bypass   Diabetes Maternal Aunt    Diabetes Maternal Uncle    Cancer Maternal Grandmother  uterine   Breast cancer Cousin    Thyroid cancer Other        2 cousins   Colon cancer Neg Hx    Esophageal cancer Neg Hx    Liver cancer Neg Hx    Pancreatic cancer Neg Hx    Rectal cancer Neg Hx    Stomach cancer Neg Hx    Social History   Socioeconomic History   Marital status: Married    Spouse name: Fayrene Fearing   Number of children: 1   Years of education: 12   Highest education level: Not on file  Occupational History   Occupation: Retired    Associate Professor: [employer]  Tobacco Use   Smoking status: Never    Passive exposure: Past   Smokeless tobacco: Never  Vaping Use   Vaping status: Never Used  Substance and Sexual Activity   Alcohol use: Yes    Alcohol/week: 0.0 standard drinks of alcohol    Comment: occ wine   Drug use: No   Sexual activity: Yes    Partners: Male    Birth control/protection: Post-menopausal    Comment: hysterectomy  Other Topics Concern   Not on file  Social History Narrative   Lives with husband.   Social Determinants of Health   Financial Resource Strain: Low Risk  (09/25/2023)   Overall Financial Resource Strain (CARDIA)    Difficulty of Paying Living Expenses: Not hard at all  Food Insecurity: No Food Insecurity (09/25/2023)   Hunger Vital Sign    Worried About Running Out of Food in the Last Year: Never true    Ran Out of Food in the Last Year: Never true  Transportation Needs: No Transportation Needs (09/25/2023)   PRAPARE - Scientist, research (physical sciences) (Medical): No    Lack of Transportation (Non-Medical): No  Physical Activity: Insufficiently Active (09/25/2023)   Exercise Vital Sign    Days of Exercise per Week: 3 days    Minutes of Exercise per Session: 30 min  Stress: No Stress Concern Present (09/25/2023)   [employer] of Occupational Health - Occupational Stress Questionnaire    Feeling of Stress : Not at all  Social Connections: Moderately Integrated (09/25/2023)   Social Connection and Isolation Panel [NHANES]    Frequency of Communication with Friends and Family: More than three times a week    Frequency of Social Gatherings with Friends and Family: Once a week    Attends Religious Services: More than 4 times per year    Active Member of [employer] or Organizations: No    Attends Engineer, structural: Never    Marital Status: Married    Tobacco Counseling Counseling given: Not Answered   Clinical Intake:  Pre-visit preparation completed: Yes  Pain : No/denies pain     BMI - recorded: 35.87 Nutritional Status: BMI > 30  Obese Nutritional Risks: None Diabetes: No  How often do you need to have someone help you when you read instructions, pamphlets, or other written materials from your doctor or pharmacy?: 1 - Never  Interpreter Needed?: No  Information entered by :: Kollyn Lingafelter, RMA   Activities of Daily Living    09/25/2023    1:06 PM  In your present state of health, do you have any difficulty performing the following activities:  Hearing? 0  Vision? 0  Difficulty concentrating or making decisions? 0  Walking or climbing stairs? 0  Dressing or bathing? 0  Doing errands, shopping? 0  Preparing  Food and eating ? N  Using the Toilet? N  In the past six months, have you accidently leaked urine? N  Do you have problems with loss of bowel control? N  Managing your Medications? N  Managing your Finances? N  Housekeeping or managing your Housekeeping? N    Patient Care  Team: Corwin Levins, MD as PCP - General (Internal Medicine)  Indicate any recent Medical Services you may have received from other than [facility] providers in the past year (date may be approximate).     Assessment:   This is a routine wellness examination for [facility].  Hearing/Vision screen Hearing Screening - Comments:: Denies hearing difficulties   Vision Screening - Comments:: Wears eyeglasses   Goals Addressed             This Visit's Progress    Patient Stated       Increase my physical activity by joining the gym and starting to walk routinely Still working on this-2024      Depression Screen    09/25/2023    1:13 PM 08/15/2023    9:38 AM 09/13/2022    3:15 PM 09/08/2022    8:10 AM 08/10/2022   10:06 AM 08/09/2021   10:49 AM 08/09/2021   10:07 AM  PHQ 2/9 Scores  PHQ - 2 Score 0 0 0 0  0 0  PHQ- 9 Score 0   0     Exception Documentation     Patient refusal      Fall Risk    09/25/2023    1:10 PM 08/15/2023    9:38 AM 09/13/2022    3:04 PM 09/08/2022    8:09 AM 08/10/2022   10:06 AM  Fall Risk   Falls in the past year? 0 0 0 0 0  Number falls in past yr: 0 0 0 0   Injury with Fall? 0 0 0 0   Risk for fall due to : No Fall Risks No Fall Risks No Fall Risks No Fall Risks No Fall Risks  Follow up Falls prevention discussed;Falls evaluation completed Falls evaluation completed Falls evaluation completed Falls evaluation completed Follow up appointment    MEDICARE RISK AT HOME: Medicare Risk at Home Any stairs in or around the home?: Yes If so, are there any without handrails?: Yes Home free of loose throw rugs in walkways, pet beds, electrical cords, etc?: Yes Adequate lighting in your home to reduce risk of falls?: Yes Life alert?: No Use of a cane, walker or w/c?: No Grab bars in the bathroom?: Yes Shower chair or bench in shower?: Yes Elevated toilet seat or a handicapped toilet?: Yes  TIMED UP AND GO:  Was the test performed?  No    Cognitive Function:         09/25/2023    1:12 PM 09/13/2022    3:05 PM  6CIT Screen  What Year? 0 points 0 points  What month? 0 points 0 points  What time? 0 points 0 points  Count back from 20 0 points 0 points  Months in reverse 0 points 0 points  Repeat phrase 0 points 0 points  Total Score 0 points 0 points    Immunizations Immunization History  Administered Date(s) Administered   Fluad Quad(high Dose 65+) 08/02/2019, 08/06/2020, 08/09/2021, 08/10/2022   Fluad Trivalent(High Dose 65+) 08/15/2023   Influenza Split 09/12/2012   Influenza Whole 10/03/2007, 08/16/2010   Influenza,inj,Quad PF,6+ Mos 08/07/2013, 07/18/2014, 07/21/2015, 07/21/2016, 07/28/2017, 07/31/2018   PFIZER(Purple Top)SARS-COV-2  Vaccination 12/13/2019, 01/07/2020, 10/12/2020   PNEUMOCOCCAL CONJUGATE-20 08/15/2023   Pneumococcal Conjugate-13 08/19/2019   Td 09/08/2003   Tdap 03/20/2014    TDAP status: Up to date  Flu Vaccine status: Up to date  Pneumococcal vaccine status: Up to date  Covid-19 vaccine status: Completed vaccines  Qualifies for Shingles Vaccine? Yes   Zostavax completed No   Shingrix Completed?: No.    Education has been provided regarding the importance of this vaccine. Patient has been advised to call insurance company to determine out of pocket expense if they have not yet received this vaccine. Advised may also receive vaccine at local pharmacy or Health Dept. Verbalized acceptance and understanding.  Screening Tests Health Maintenance  Topic Date Due   COVID-19 Vaccine (4 - 2023-24 season) 10/11/2023 (Originally 07/09/2023)   Zoster Vaccines- Shingrix (1 of 2) 12/08/2023 (Originally 10/08/1970)   DTaP/Tdap/Td (3 - Td or Tdap) 03/20/2024   Medicare Annual Wellness (AWV)  09/24/2024   MAMMOGRAM  08/02/2025   Colonoscopy  07/12/2027   Pneumonia Vaccine 89+ Years old  Completed   INFLUENZA VACCINE  Completed   DEXA SCAN  Completed   Hepatitis C Screening  Completed   HPV VACCINES  Aged Out     Health Maintenance  There are no preventive care reminders to display for this patient.   Colorectal cancer screening: Type of screening: Colonoscopy. Completed 07/11/2017. Repeat every 10 years  Mammogram status: Completed 08/03/2023. Repeat every year  Bone Density status: Completed 10/174/2023. Results reflect: Bone density results: OSTEOPENIA. Repeat every 2 years.  Lung Cancer Screening: (Low Dose CT Chest recommended if Age 48-80 years, 20 pack-year currently smoking OR have quit w/in 15years.) does not qualify.   Lung Cancer Screening Referral: N/A  Additional Screening:  Hepatitis C Screening: does qualify; Completed 07/13/2016  Vision Screening: Recommended annual ophthalmology exams for early detection of glaucoma and other disorders of the eye. Is the patient up to date with their annual eye exam?  Yes  Who is the provider or what is the name of the office in which the patient attends annual eye exams? [facility] If pt is not established with a provider, would they like to be referred to a provider to establish care? No .   Dental Screening: Recommended annual dental exams for proper oral hygiene   Community Resource Referral / Chronic Care Management: CRR required this visit?  No   CCM required this visit?  No     Plan:     I have personally reviewed and noted the following in the patient's chart:   Medical and social history Use of alcohol, tobacco or illicit drugs  Current medications and supplements including opioid prescriptions. Patient is not currently taking opioid prescriptions. Functional ability and status Nutritional status Physical activity Advanced directives List of other physicians Hospitalizations, surgeries, and ER visits in previous 12 months Vitals Screenings to include cognitive, depression, and falls Referrals and appointments  In addition, I have reviewed and discussed with patient certain preventive protocols, quality  metrics, and best practice recommendations. A written personalized care plan for preventive services as well as general preventive health recommendations were provided to patient.     Chas Axel L Myria Steenbergen, CMA   09/25/2023   After Visit Summary: (MyChart) Due to this being a telephonic visit, the after visit summary with patients personalized plan was offered to patient via MyChart   Nurse Notes: Patient is due for a Shingrix vaccine and she does plan to get it.  She  is up to date on all other health maintenance.  Patient had no concerns to address today.

## 2023-09-26 ENCOUNTER — Other Ambulatory Visit: Payer: Medicare Other

## 2023-09-26 ENCOUNTER — Other Ambulatory Visit: Payer: Self-pay | Admitting: Internal Medicine

## 2023-09-26 DIAGNOSIS — B9681 Helicobacter pylori [H. pylori] as the cause of diseases classified elsewhere: Secondary | ICD-10-CM

## 2023-09-26 DIAGNOSIS — K2970 Gastritis, unspecified, without bleeding: Secondary | ICD-10-CM | POA: Diagnosis not present

## 2023-09-29 LAB — H. PYLORI ANTIGEN, STOOL: H pylori Ag, Stl: NEGATIVE

## 2023-10-02 DIAGNOSIS — E785 Hyperlipidemia, unspecified: Secondary | ICD-10-CM | POA: Diagnosis not present

## 2023-10-03 LAB — LIPID PANEL
Chol/HDL Ratio: 2.5 ratio (ref 0.0–4.4)
Cholesterol, Total: 130 mg/dL (ref 100–199)
HDL: 51 mg/dL (ref >39)
LDL Chol Calc (NIH): 61 mg/dL (ref 0–99)
Triglycerides: 94 mg/dL (ref 0–149)
VLDL Cholesterol Cal: 18 mg/dL (ref 5–40)

## 2023-10-04 ENCOUNTER — Ambulatory Visit: Payer: Medicare Other | Attending: Internal Medicine | Admitting: Internal Medicine

## 2023-10-04 ENCOUNTER — Encounter: Payer: Self-pay | Admitting: Internal Medicine

## 2023-10-04 VITALS — BP 144/80 | HR 76 | Ht 67.0 in | Wt 229.0 lb

## 2023-10-04 DIAGNOSIS — R072 Precordial pain: Secondary | ICD-10-CM | POA: Insufficient documentation

## 2023-10-04 DIAGNOSIS — R9431 Abnormal electrocardiogram [ECG] [EKG]: Secondary | ICD-10-CM | POA: Insufficient documentation

## 2023-10-04 DIAGNOSIS — G4733 Obstructive sleep apnea (adult) (pediatric): Secondary | ICD-10-CM | POA: Insufficient documentation

## 2023-10-04 DIAGNOSIS — E785 Hyperlipidemia, unspecified: Secondary | ICD-10-CM | POA: Insufficient documentation

## 2023-10-04 DIAGNOSIS — I700 Atherosclerosis of aorta: Secondary | ICD-10-CM | POA: Diagnosis not present

## 2023-10-04 NOTE — Progress Notes (Signed)
Cardiology Office Note:  .   Date:  10/04/2023  ID:  [name], DOB Feb 25, 1951, MRN [id number] PCP: Kelly Levins, MD  [facility] Providers Cardiologist:  None    History of Present Illness: [name] Kitchen   [name] is a 72 y.o. female.  Discussed the use of AI scribe software for clinical note transcription with the patient, who gave verbal consent to proceed.  History of Present Illness   The patient, with a history of mild coronary artery disease and patent foramen ovale, presents for a follow-up visit. She reports no significant increase in chest discomfort, which is occasionally felt on the right and left side. The patient's coronary CT scan in January showed mild blockages and a calcium score of twenty one. 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 An echocardiogram was also performed, which showed no signs of mitral valve prolapse.  The patient is currently on Crestor (rosuvastatin) and ezetimibe for cholesterol management. Her cholesterol levels have been excellent, with a slight increase in LDL from forty six to sixty one since the last check in October. The patient has not made any changes in her cholesterol management, and the increase is attributed to stress. Her husband is undergoing preventive chemotherapy.  The patient also reports a slightly high blood pressure reading during the visit, but she is not currently on any medication for blood pressure. She has been advised to monitor her blood pressure at home.        ROS: negative except per HPI above.  Studies Reviewed: [name] Kitchen   EKG Interpretation Date/Time:  Wednesday October 04 2023 15:01:46 EST Ventricular Rate:  76 PR Interval:  140 QRS Duration:  82 QT Interval:  404 QTC Calculation: 454 R Axis:   19  Text Interpretation: Normal sinus rhythm Low voltage QRS Nonspecific T wave abnormality No significant change since last  tracing Confirmed by [name] ([id number]) on 10/04/2023 3:53:02 PM    Results   LABS LDL: 46 (08/2023) LDL: 61 (09/2023)  RADIOLOGY Coronary CT scan: Mild coronary artery disease, calcium score 21 (11/2022)  DIAGNOSTIC Echocardiogram: Normal, no mitral valve prolapse EKG: Nonspecific T wave abnormality (10/04/2023)     Risk Assessment/Calculations:    Physical Exam:   VS:  BP (!) 144/80   Pulse 76   Ht 5\' 7"  (1.702 m)   Wt 229 lb (103.9 kg)   SpO2 95%   BMI 35.87 kg/m    Wt Readings from Last 3 Encounters:  10/04/23 229 lb (103.9 kg)  09/25/23 229 lb (103.9 kg)  08/15/23 229 lb (103.9 kg)     Physical Exam   VITALS: BP slightly elevated on the day of the exam     GEN: Well nourished, well developed in no acute distress NECK: No JVD; No carotid bruits CARDIAC: RRR, no murmurs, rubs, gallops RESPIRATORY:  Clear to auscultation without rales, wheezing or rhonchi  ABDOMEN: Soft, non-tender, non-distended EXTREMITIES:  No edema; No deformity   ASSESSMENT AND PLAN: .    1. Hyperlipidemia, unspecified hyperlipidemia type   2. Aortic atherosclerosis (HCC)     Assessment and Plan    Hyperlipidemia LDL slightly increased from 46 to 61, but still within normal range. No changes in management. Patient is on Rosuvastatin 10mg  and Ezetimibe 10mg . -Continue current medications. -Recheck lipid panel in 6 months.  Patent Foramen Ovale No symptoms of stroke or significant shortness  of breath. No history of blood clots in legs. -Continue to monitor condition. No intervention needed at this time.  Mild Coronary Artery Disease No significant increase in discomfort. Calcium score was 21 on last CT scan in January. -Continue current management and monitor symptoms.  Elevated Blood Pressure Slightly elevated blood pressure noted during visit. No current antihypertensive medication. -Advise patient to monitor blood pressure at home at least three times a week. -Recheck  blood pressure in 6 months.  General Health Maintenance / Followup Plans -Follow up in 6 months to monitor blood pressure, cholesterol, and overall cardiac health.              Kelly Brass, MD, [facility] [place]  [facility]

## 2023-10-04 NOTE — Patient Instructions (Signed)
Medication Instructions:  Your physician recommends that you continue on your current medications as directed. Please refer to the Current Medication list given to you today.  *If you need a refill on your cardiac medications before your next appointment, please call your pharmacy*   Follow-Up: At [facility], you and your health needs are our priority.  As part of our continuing mission to provide you with exceptional heart care, we have created designated Provider Care Teams.  These Care Teams include your primary Cardiologist (physician) and Advanced Practice Providers (APPs -  Physician Assistants and Nurse Practitioners) who all work together to provide you with the care you need, when you need it.  We recommend signing up for the patient portal called "MyChart".  Sign up information is provided on this After Visit Summary.  MyChart is used to connect with patients for Virtual Visits (Telemedicine).  Patients are able to view lab/test results, encounter notes, upcoming appointments, etc.  Non-urgent messages can be sent to your provider as well.   To learn more about what you can do with MyChart, go to [email or web address].    Your next appointment:   6 month(s)  Provider:   Weston Brass, MD

## 2023-10-16 ENCOUNTER — Telehealth: Payer: Self-pay | Admitting: Internal Medicine

## 2023-10-16 DIAGNOSIS — R42 Dizziness and giddiness: Secondary | ICD-10-CM

## 2023-10-16 NOTE — Telephone Encounter (Signed)
Ok to forward to PCCs 

## 2023-10-16 NOTE — Telephone Encounter (Signed)
Pt called wanting Dr. Jonny Ruiz to send a referral ([facility])for when they  had spoken about it at the last office visit. Please advise.

## 2023-10-16 NOTE — Telephone Encounter (Signed)
Ok this is done 

## 2023-10-16 NOTE — Telephone Encounter (Signed)
She wants to get the referral sent to [facility] physical therapy on [address].

## 2023-10-19 ENCOUNTER — Ambulatory Visit: Payer: Medicare Other | Attending: Internal Medicine

## 2023-10-19 ENCOUNTER — Other Ambulatory Visit: Payer: Self-pay

## 2023-10-19 DIAGNOSIS — R42 Dizziness and giddiness: Secondary | ICD-10-CM | POA: Insufficient documentation

## 2023-10-19 DIAGNOSIS — R2681 Unsteadiness on feet: Secondary | ICD-10-CM | POA: Insufficient documentation

## 2023-10-19 NOTE — Therapy (Signed)
OUTPATIENT PHYSICAL THERAPY VESTIBULAR EVALUATION     Patient Name: [name] MRN: [id number] DOB:06-01-1951, 72 y.o., female Today's Date: 10/19/2023  END OF SESSION:  PT End of Session - 10/19/23 1621     Visit Number 1    Number of Visits 8    Date for PT Re-Evaluation 11/16/23    Authorization Type Medicare    PT Start Time 1615    PT Stop Time 1700    PT Time Calculation (min) 45 min             Past Medical History:  Diagnosis Date   Abnormal Pap smear of vagina 06/17/15   LGSIL   Allergic rhinitis, cause unspecified    Arthritis    ARTHRITIS, KNEE 02/10/2009   Qualifier: Diagnosis of  By: Alphonzo Severance MD, Willie R    BRCA negative 03/25/13   Cervical cancer (HCC) 1984   Cervical disc disease 02/11/2012   Chronic lower back pain    Degenerative arthritis of hip 02/11/2012   Diverticulosis 02/11/2012   GERD 08/10/2007   Qualifier: Diagnosis of  By: Linna Darner, CMA, Cindy     GERD (gastroesophageal reflux disease)    GERD (gastroesophageal reflux disease)    Hemorrhoids    HEMORRHOIDS-INTERNAL 07/21/2010   Qualifier: Diagnosis of  By: Wilmon Pali NP, Paula     Hiatal hernia    Hyperlipidemia 02/13/2012   Mitral valve prolapse    no cardiologist, mild   OAB (overactive bladder)    Obesity    OVERACTIVE BLADDER 08/10/2007   Qualifier: Diagnosis of  By: Linna Darner, CMA, Cindy     Pneumonia 2017   PONV (postoperative nausea and vomiting)    yrs ago unable to raise arms after female surgery, no recent problems   Recurrent UTI    Sleep apnea    uses cpap set on 2   SUI (stress urinary incontinence, female)    Urinary incontinence    VAGINITIS, ATROPHIC 11/11/2008   Qualifier: Diagnosis of  By: Alphonzo Severance MD, Loni Dolly    Past Surgical History:  Procedure Laterality Date   bladder tack     x 3 or 4   BUNIONECTOMY Right 2007   CERVICAL DISCECTOMY  2002   fusion and plate C 3   CHOLECYSTECTOMY N/A 08/13/2013   Procedure: LAPAROSCOPIC CHOLECYSTECTOMY WITH INTRAOPERATIVE  CHOLANGIOGRAM;  Surgeon: Kandis Cocking, MD;  Location: [facility];  Service: General;  Laterality: N/A;   CLOSED REDUCTION METATARSAL FRACTURE     rt side 06-03-18, left side 08-19-17   COLONOSCOPY  2018   conization of cervix  1982   with D&C   CYSTOCELE REPAIR  2003   rectocele repair   EYE SURGERY Bilateral yrs ago   lasik   ORIF ANKLE FRACTURE Right 08/20/2017   Procedure: OPEN REDUCTION INTERNAL FIXATION (ORIF) ANKLE FRACTURE;  Surgeon: Cammy Copa, MD;  Location: [facility];  Service: Orthopedics;  Laterality: Right;   SHOULDER ARTHROSCOPY Right 2004   frozen shoulder   TONSILLECTOMY  as child   adenoids removed   TRANSANAL HEMORRHOIDAL DEARTERIALIZATION N/A 07/30/2020   Procedure: TRANSANAL HEMORRHOIDAL DEARTERIALIZATION;  Surgeon: Romie Levee, MD;  Location: [facility];  Service: General;  Laterality: N/A;   VAGINAL HYSTERECTOMY  1986   partial   Patient Active Problem List   Diagnosis Date Noted   Asthmatic bronchitis, mild intermittent, uncomplicated 01/04/2023   Acute sinusitis 01/04/2023   Left lumbar radiculopathy 10/25/2021   Trochanteric bursitis of right hip  08/09/2021   Vitamin D deficiency 08/09/2020   Paresthesia of foot, bilateral 08/09/2020   Neuritis of right ulnar nerve 08/09/2020   Aortic atherosclerosis (HCC) 08/06/2020   Left cervical radiculopathy 10/07/2019   Bilateral low back pain with bilateral sciatica 10/07/2019   Acute conjunctivitis of left eye 01/17/2019   Urinary frequency 01/09/2019   Dysuria 03/28/2018   Trimalleolar fracture 08/20/2017   Ankle fracture 08/20/2017   Mild obstructive sleep apnea 07/30/2017   Right hip pain 07/28/2017   Abnormal TSH 07/21/2015   Greater trochanteric bursitis of right hip 08/04/2014   Localized osteoarthrosis, lower leg 08/04/2014   Biliary colic 08/07/2013   Cholelithiasis 08/07/2013   Hyperlipidemia 02/13/2012   Impaired glucose tolerance 02/13/2012   Diverticulosis 02/11/2012    Degenerative arthritis of hip 02/11/2012   Cervical disc disease 02/11/2012   Allergic rhinitis    Chronic low back pain    Obesity    Blood in stool 08/31/2010   HEMORRHOIDS-INTERNAL 07/21/2010   CONSTIPATION 07/19/2010   ARTHRITIS, KNEE 02/10/2009   VAGINITIS, ATROPHIC 11/11/2008   EXOGENOUS OBESITY 07/29/2008   GERD 08/10/2007   OVERACTIVE BLADDER 08/10/2007    PCP: Corwin Levins, MD REFERRING PROVIDER: Corwin Levins, MD  REFERRING DIAG: R42 (ICD-10-CM) - Vertigo  THERAPY DIAG:  Dizziness and giddiness  Unsteadiness on feet  ONSET DATE: 2 months  Rationale for Evaluation and Treatment: Rehabilitation  SUBJECTIVE:   SUBJECTIVE STATEMENT: Sudden onset vertigo x 2 months ago. Worse in AM and notes overall balance impairments as result.  Notes symptoms are present most every day with some days worse than others. Triggered by head positions and body position changes. Denies photo/phonophobia, no tinnitus, no fullness to ear, notes a few instances of HA. Reports neck pain has been aggravated with these episodes.  Pt accompanied by: self  PERTINENT HISTORY:   PAIN:  Are you having pain? No  PRECAUTIONS: None  RED FLAGS: None   WEIGHT BEARING RESTRICTIONS: No  FALLS: Has patient fallen in last 6 months? No  LIVING ENVIRONMENT: Lives with: lives with their spouse Lives in: House/apartment Stairs: Yes: Internal: flight steps; bilateral but cannot reach both Has following equipment at home: None  PLOF: Independent  PATIENT GOALS: eliminate symptoms  OBJECTIVE:  Note: Objective measures were completed at Evaluation unless otherwise noted.  DIAGNOSTIC FINDINGS: n/a for episode  COGNITION: Overall cognitive status: WNL   SENSATION: NT  EDEMA:  none  MUSCLE TONE:  NT  DTRs:  NT  POSTURE:  No Significant postural limitations  Cervical ROM:    WFL  STRENGTH: NT     PATIENT SURVEYS:  FOTO 51  VESTIBULAR ASSESSMENT:  GENERAL OBSERVATION:  wears trifocals   SYMPTOM BEHAVIOR:  Subjective history:   Non-Vestibular symptoms: tinnitus  Type of dizziness: Spinning/Vertigo  Frequency: daily  Duration: seconds-minutes, lingering balance issues thereafter  Aggravating factors: Induced by position change: lying supine, rolling to the right, rolling to the left, and supine to sit and Worse in the morning  Relieving factors: slow movements  Progression of symptoms: unchanged  OCULOMOTOR EXAM:  Ocular Alignment: normal  Ocular ROM: No Limitations  Spontaneous Nystagmus: absent  Gaze-Induced Nystagmus: absent  Smooth Pursuits: intact  Saccades: intact  Convergence/Divergence: 3 cm     VESTIBULAR - OCULAR REFLEX:   Slow VOR: Normal, no symptoms  VOR Cancellation: Normal  Head-Impulse Test: HIT Right: negative HIT Left: negative  Dynamic Visual Acuity: Not able to be assessed   POSITIONAL TESTING: Right Dix-Hallpike: upbeating, right  nystagmus Left Dix-Hallpike: no nystagmus Right Roll Test: no nystagmus Left Roll Test: possibly geotropic? Difficult to tell due to excess eye movements  MOTION SENSITIVITY:  Motion Sensitivity Quotient Intensity: 0 = none, 1 = Lightheaded, 2 = Mild, 3 = Moderate, 4 = Severe, 5 = Vomiting  Intensity  1. Sitting to supine   2. Supine to L side   3. Supine to R side   4. Supine to sitting   5. L Hallpike-Dix   6. Up from L    7. R Hallpike-Dix   8. Up from R    9. Sitting, head tipped to L knee   10. Head up from L knee   11. Sitting, head tipped to R knee   12. Head up from R knee   13. Sitting head turns x5   14.Sitting head nods x5   15. In stance, 180 turn to L    16. In stance, 180 turn to R     OTHOSTATICS: not done  FUNCTIONAL GAIT:  FGA: TBD   VESTIBULAR TREATMENT:                                                                                                   DATE: 10/19/23  Canalith Repositioning:  Epley Right: Number of Reps: 2   PATIENT  EDUCATION: Education details: assessmnet details, rationale of tx Person educated: Patient Education method: Explanation and Handouts Education comprehension: verbalized understanding  HOME EXERCISE PROGRAM:  GOALS: Goals reviewed with patient? Yes  SHORT TERM GOALS: Target date: same as LTG    LONG TERM GOALS: Target date: 11/16/2023    The patient will be independent with HEP for gaze adaptation, habituation, balance, and general mobility. Baseline:  Goal status: INITIAL  2.  Pt to be free of positional dizziness to reduce risk for falls and improve quality of life Baseline:  Goal status: INITIAL  3.  Pt to manifest improved symptoms per expected outcomes FOTO survey of 59 Baseline: 51 Goal status: INITIAL  4.  Demo improved balance and low risk for falls per score 25/28 Functional Gait Assessment Baseline: TBD Goal status: INITIAL    ASSESSMENT:  CLINICAL IMPRESSION: Patient is a 72 y.o. lady who was seen today for physical therapy evaluation and treatment for vertigo.  Oculomotor exam WNL and no disturbance with VOR testing.  Demonstrates provocative head movements of extension and forward bending.  Positional testing yields right upbeating nystagmus with Right Dix-Hallpike and possibly left geotropic nystagmus with left roll test but difficult to determine.  Initiated tx on this date with canalith repositioning via Right Epley maneuver.  Pt educated on details of condition and rationale of tx and agreeable to continued sessions to address deficits and limitations.   OBJECTIVE IMPAIRMENTS: decreased activity tolerance, decreased balance, difficulty walking, and dizziness.   ACTIVITY LIMITATIONS: bending, stairs, transfers, bed mobility, and locomotion level  PARTICIPATION LIMITATIONS: meal prep, cleaning, laundry, and community activity  PERSONAL FACTORS: Age, Time since onset of injury/illness/exacerbation, and 1 comorbidity: PMH  are also affecting patient's  functional outcome.   REHAB POTENTIAL: Excellent  CLINICAL DECISION MAKING: Stable/uncomplicated  EVALUATION COMPLEXITY: Low   PLAN:  PT FREQUENCY: 2x/week  PT DURATION: 4 weeks  PLANNED INTERVENTIONS: 97110-Therapeutic exercises, 97530- Therapeutic activity, 97112- Neuromuscular re-education, 97535- Self Care, 78295- Manual therapy, [phone]- Gait training, [phone]- Canalith repositioning, Patient/Family education, Balance training, Stair training, Joint mobilization, Spinal mobilization, Vestibular training, and DME instructions  PLAN FOR NEXT SESSION: right positional dizziness (possibly left horizontal canal too?) Functional Gait Assessment   5:26 PM, 10/19/23 M. Shary Decamp, PT, DPT Physical Therapist- [place] Office Number: [phone]

## 2023-10-23 ENCOUNTER — Ambulatory Visit: Payer: Medicare Other

## 2023-10-23 DIAGNOSIS — R2681 Unsteadiness on feet: Secondary | ICD-10-CM

## 2023-10-23 DIAGNOSIS — R42 Dizziness and giddiness: Secondary | ICD-10-CM

## 2023-10-23 NOTE — Therapy (Signed)
OUTPATIENT PHYSICAL THERAPY VESTIBULAR TREATMENT     Patient Name: [name] MRN: [id number] DOB:01-28-1951, 72 y.o., female Today's Date: 10/23/2023  END OF SESSION:  PT End of Session - 10/23/23 1151     Visit Number 2    Number of Visits 8    Date for PT Re-Evaluation 11/16/23    Authorization Type Medicare    PT Start Time 1145    PT Stop Time 1230    PT Time Calculation (min) 45 min             Past Medical History:  Diagnosis Date   Abnormal Pap smear of vagina 06/17/15   LGSIL   Allergic rhinitis, cause unspecified    Arthritis    ARTHRITIS, KNEE 02/10/2009   Qualifier: Diagnosis of  By: Alphonzo Severance MD, Willie R    BRCA negative 03/25/13   Cervical cancer (HCC) 1984   Cervical disc disease 02/11/2012   Chronic lower back pain    Degenerative arthritis of hip 02/11/2012   Diverticulosis 02/11/2012   GERD 08/10/2007   Qualifier: Diagnosis of  By: Linna Darner, CMA, Cindy     GERD (gastroesophageal reflux disease)    GERD (gastroesophageal reflux disease)    Hemorrhoids    HEMORRHOIDS-INTERNAL 07/21/2010   Qualifier: Diagnosis of  By: Wilmon Pali NP, Paula     Hiatal hernia    Hyperlipidemia 02/13/2012   Mitral valve prolapse    no cardiologist, mild   OAB (overactive bladder)    Obesity    OVERACTIVE BLADDER 08/10/2007   Qualifier: Diagnosis of  By: Linna Darner, CMA, Cindy     Pneumonia 2017   PONV (postoperative nausea and vomiting)    yrs ago unable to raise arms after female surgery, no recent problems   Recurrent UTI    Sleep apnea    uses cpap set on 2   SUI (stress urinary incontinence, female)    Urinary incontinence    VAGINITIS, ATROPHIC 11/11/2008   Qualifier: Diagnosis of  By: Alphonzo Severance MD, Loni Dolly    Past Surgical History:  Procedure Laterality Date   bladder tack     x 3 or 4   BUNIONECTOMY Right 2007   CERVICAL DISCECTOMY  2002   fusion and plate C 3   CHOLECYSTECTOMY N/A 08/13/2013   Procedure: LAPAROSCOPIC CHOLECYSTECTOMY WITH INTRAOPERATIVE  CHOLANGIOGRAM;  Surgeon: Kandis Cocking, MD;  Location: [facility];  Service: General;  Laterality: N/A;   CLOSED REDUCTION METATARSAL FRACTURE     rt side 06-03-18, left side 08-19-17   COLONOSCOPY  2018   conization of cervix  1982   with D&C   CYSTOCELE REPAIR  2003   rectocele repair   EYE SURGERY Bilateral yrs ago   lasik   ORIF ANKLE FRACTURE Right 08/20/2017   Procedure: OPEN REDUCTION INTERNAL FIXATION (ORIF) ANKLE FRACTURE;  Surgeon: Cammy Copa, MD;  Location: [facility];  Service: Orthopedics;  Laterality: Right;   SHOULDER ARTHROSCOPY Right 2004   frozen shoulder   TONSILLECTOMY  as child   adenoids removed   TRANSANAL HEMORRHOIDAL DEARTERIALIZATION N/A 07/30/2020   Procedure: TRANSANAL HEMORRHOIDAL DEARTERIALIZATION;  Surgeon: Romie Levee, MD;  Location: [facility];  Service: General;  Laterality: N/A;   VAGINAL HYSTERECTOMY  1986   partial   Patient Active Problem List   Diagnosis Date Noted   Asthmatic bronchitis, mild intermittent, uncomplicated 01/04/2023   Acute sinusitis 01/04/2023   Left lumbar radiculopathy 10/25/2021   Trochanteric bursitis of right hip  08/09/2021   Vitamin D deficiency 08/09/2020   Paresthesia of foot, bilateral 08/09/2020   Neuritis of right ulnar nerve 08/09/2020   Aortic atherosclerosis (HCC) 08/06/2020   Left cervical radiculopathy 10/07/2019   Bilateral low back pain with bilateral sciatica 10/07/2019   Acute conjunctivitis of left eye 01/17/2019   Urinary frequency 01/09/2019   Dysuria 03/28/2018   Trimalleolar fracture 08/20/2017   Ankle fracture 08/20/2017   Mild obstructive sleep apnea 07/30/2017   Right hip pain 07/28/2017   Abnormal TSH 07/21/2015   Greater trochanteric bursitis of right hip 08/04/2014   Localized osteoarthrosis, lower leg 08/04/2014   Biliary colic 08/07/2013   Cholelithiasis 08/07/2013   Hyperlipidemia 02/13/2012   Impaired glucose tolerance 02/13/2012   Diverticulosis 02/11/2012    Degenerative arthritis of hip 02/11/2012   Cervical disc disease 02/11/2012   Allergic rhinitis    Chronic low back pain    Obesity    Blood in stool 08/31/2010   HEMORRHOIDS-INTERNAL 07/21/2010   CONSTIPATION 07/19/2010   ARTHRITIS, KNEE 02/10/2009   VAGINITIS, ATROPHIC 11/11/2008   EXOGENOUS OBESITY 07/29/2008   GERD 08/10/2007   OVERACTIVE BLADDER 08/10/2007    PCP: Corwin Levins, MD REFERRING PROVIDER: Corwin Levins, MD  REFERRING DIAG: R42 (ICD-10-CM) - Vertigo  THERAPY DIAG:  Dizziness and giddiness  Unsteadiness on feet  ONSET DATE: 2 months  Rationale for Evaluation and Treatment: Rehabilitation  SUBJECTIVE:   SUBJECTIVE STATEMENT: Had a fall yesterday when tripped over something and fell and bruised knees. One episode of significant dizziness when arising from bed and standing in bathroom, like being pushed Pt accompanied by: self  PERTINENT HISTORY:   PAIN:  Are you having pain? No  PRECAUTIONS: None  RED FLAGS: None   WEIGHT BEARING RESTRICTIONS: No  FALLS: Has patient fallen in last 6 months? No  LIVING ENVIRONMENT: Lives with: lives with their spouse Lives in: House/apartment Stairs: Yes: Internal: flight steps; bilateral but cannot reach both Has following equipment at home: None  PLOF: Independent  PATIENT GOALS: eliminate symptoms  OBJECTIVE:   TODAY'S TREATMENT: 10/23/23 Activity Comments  Roll test Left: geotropic (?) x 40 sec Right: no nystagmus  Dix-Hallpike Left:  Right: upbeating nystagmus  R Epley   R Dix-Hallpike Right upbeating-small amplitude approx 30-45 sec  R Epley   R Dix-Hallpike Small amplitude upbeating x 15 sec  R Epley   Brandt-Daroff x 3 reps Start with right side    Note: Objective measures were completed at Evaluation unless otherwise noted.  DIAGNOSTIC FINDINGS: n/a for episode  COGNITION: Overall cognitive status: WNL   SENSATION: NT  EDEMA:  none  MUSCLE TONE:  NT  DTRs:  NT  POSTURE:   No Significant postural limitations  Cervical ROM:    WFL  STRENGTH: NT     PATIENT SURVEYS:  FOTO 51  VESTIBULAR ASSESSMENT:  GENERAL OBSERVATION: wears trifocals   SYMPTOM BEHAVIOR:  Subjective history:   Non-Vestibular symptoms: tinnitus  Type of dizziness: Spinning/Vertigo  Frequency: daily  Duration: seconds-minutes, lingering balance issues thereafter  Aggravating factors: Induced by position change: lying supine, rolling to the right, rolling to the left, and supine to sit and Worse in the morning  Relieving factors: slow movements  Progression of symptoms: unchanged  OCULOMOTOR EXAM:  Ocular Alignment: normal  Ocular ROM: No Limitations  Spontaneous Nystagmus: absent  Gaze-Induced Nystagmus: absent  Smooth Pursuits: intact  Saccades: intact  Convergence/Divergence: 3 cm     VESTIBULAR - OCULAR REFLEX:   Slow VOR:  Normal, no symptoms  VOR Cancellation: Normal  Head-Impulse Test: HIT Right: negative HIT Left: negative  Dynamic Visual Acuity: Not able to be assessed   POSITIONAL TESTING: Right Dix-Hallpike: upbeating, right nystagmus Left Dix-Hallpike: no nystagmus Right Roll Test: no nystagmus Left Roll Test: possibly geotropic? Difficult to tell due to excess eye movements  MOTION SENSITIVITY:  Motion Sensitivity Quotient Intensity: 0 = none, 1 = Lightheaded, 2 = Mild, 3 = Moderate, 4 = Severe, 5 = Vomiting  Intensity  1. Sitting to supine   2. Supine to L side   3. Supine to R side   4. Supine to sitting   5. L Hallpike-Dix   6. Up from L    7. R Hallpike-Dix   8. Up from R    9. Sitting, head tipped to L knee   10. Head up from L knee   11. Sitting, head tipped to R knee   12. Head up from R knee   13. Sitting head turns x5   14.Sitting head nods x5   15. In stance, 180 turn to L    16. In stance, 180 turn to R     OTHOSTATICS: not done  FUNCTIONAL GAIT:  FGA: TBD     GOALS: Goals reviewed with patient? Yes  SHORT TERM  GOALS: Target date: same as LTG    LONG TERM GOALS: Target date: 11/16/2023    The patient will be independent with HEP for gaze adaptation, habituation, balance, and general mobility. Baseline:  Goal status: INITIAL  2.  Pt to be free of positional dizziness to reduce risk for falls and improve quality of life Baseline:  Goal status: INITIAL  3.  Pt to manifest improved symptoms per expected outcomes FOTO survey of 59 Baseline: 51 Goal status: INITIAL  4.  Demo improved balance and low risk for falls per score 25/28 Functional Gait Assessment Baseline: TBD Goal status: INITIAL    ASSESSMENT:  CLINICAL IMPRESSION: Left roll test with presence of horizontal nystagmus, it was difficult to determine if geotropic vs ageotropic. Right Dix-Hallpike reveals right upbeating nystagmus addressed with right Epley maneuver completing 3 repetitions. Initiated Brandt-Daroff for continuing canalith repositioning with initial rep in right sideyling revealing upbeating nystagmus of small amplitude and 3-5 sec. Notes some symptoms when arising from left sidelying. Continued sessions to address Right and Left BPPV. Tolerated tx well and no questions noted at end of session verbalizing/demonstrating understanding of Brandt-Daroff  OBJECTIVE IMPAIRMENTS: decreased activity tolerance, decreased balance, difficulty walking, and dizziness.   ACTIVITY LIMITATIONS: bending, stairs, transfers, bed mobility, and locomotion level  PARTICIPATION LIMITATIONS: meal prep, cleaning, laundry, and community activity  PERSONAL FACTORS: Age, Time since onset of injury/illness/exacerbation, and 1 comorbidity: PMH  are also affecting patient's functional outcome.   REHAB POTENTIAL: Excellent  CLINICAL DECISION MAKING: Stable/uncomplicated  EVALUATION COMPLEXITY: Low   PLAN:  PT FREQUENCY: 2x/week  PT DURATION: 4 weeks  PLANNED INTERVENTIONS: 97110-Therapeutic exercises, 97530- Therapeutic activity, 97112-  Neuromuscular re-education, 97535- Self Care, 40347- Manual therapy, [phone]- Gait training, [phone]- Canalith repositioning, Patient/Family education, Balance training, Stair training, Joint mobilization, Spinal mobilization, Vestibular training, and DME instructions  PLAN FOR NEXT SESSION: right positional dizziness (possibly left horizontal canal too?) Functional Gait Assessment   11:51 AM, 10/23/23 M. Shary Decamp, PT, DPT Physical Therapist- [place] Office Number: [phone]

## 2023-10-26 ENCOUNTER — Encounter: Payer: Self-pay | Admitting: Physical Therapy

## 2023-10-26 ENCOUNTER — Ambulatory Visit: Payer: Medicare Other | Admitting: Physical Therapy

## 2023-10-26 DIAGNOSIS — R2681 Unsteadiness on feet: Secondary | ICD-10-CM

## 2023-10-26 DIAGNOSIS — R42 Dizziness and giddiness: Secondary | ICD-10-CM

## 2023-10-26 NOTE — Therapy (Signed)
OUTPATIENT PHYSICAL THERAPY VESTIBULAR TREATMENT     Patient Name: [name] MRN: [id number] DOB:01-04-1951, 72 y.o., female Today's Date: 10/26/2023  END OF SESSION:  PT End of Session - 10/26/23 1024     Visit Number 3    Number of Visits 8    Date for PT Re-Evaluation 11/16/23    Authorization Type Medicare    PT Start Time 1022    PT Stop Time 1055    PT Time Calculation (min) 33 min    Activity Tolerance Patient tolerated treatment well    Behavior During Therapy [facility] for tasks assessed/performed              Past Medical History:  Diagnosis Date   Abnormal Pap smear of vagina 06/17/15   LGSIL   Allergic rhinitis, cause unspecified    Arthritis    ARTHRITIS, KNEE 02/10/2009   Qualifier: Diagnosis of  By: Alphonzo Severance MD, Willie R    BRCA negative 03/25/13   Cervical cancer (HCC) 1984   Cervical disc disease 02/11/2012   Chronic lower back pain    Degenerative arthritis of hip 02/11/2012   Diverticulosis 02/11/2012   GERD 08/10/2007   Qualifier: Diagnosis of  By: Linna Darner, CMA, Cindy     GERD (gastroesophageal reflux disease)    GERD (gastroesophageal reflux disease)    Hemorrhoids    HEMORRHOIDS-INTERNAL 07/21/2010   Qualifier: Diagnosis of  By: Wilmon Pali NP, Paula     Hiatal hernia    Hyperlipidemia 02/13/2012   Mitral valve prolapse    no cardiologist, mild   OAB (overactive bladder)    Obesity    OVERACTIVE BLADDER 08/10/2007   Qualifier: Diagnosis of  By: Linna Darner, CMA, Cindy     Pneumonia 2017   PONV (postoperative nausea and vomiting)    yrs ago unable to raise arms after female surgery, no recent problems   Recurrent UTI    Sleep apnea    uses cpap set on 2   SUI (stress urinary incontinence, female)    Urinary incontinence    VAGINITIS, ATROPHIC 11/11/2008   Qualifier: Diagnosis of  By: Alphonzo Severance MD, Loni Dolly    Past Surgical History:  Procedure Laterality Date   bladder tack     x 3 or 4   BUNIONECTOMY Right 2007   CERVICAL DISCECTOMY  2002    fusion and plate C 3   CHOLECYSTECTOMY N/A 08/13/2013   Procedure: LAPAROSCOPIC CHOLECYSTECTOMY WITH INTRAOPERATIVE CHOLANGIOGRAM;  Surgeon: Kandis Cocking, MD;  Location: [facility];  Service: General;  Laterality: N/A;   CLOSED REDUCTION METATARSAL FRACTURE     rt side 06-03-18, left side 08-19-17   COLONOSCOPY  2018   conization of cervix  1982   with D&C   CYSTOCELE REPAIR  2003   rectocele repair   EYE SURGERY Bilateral yrs ago   lasik   ORIF ANKLE FRACTURE Right 08/20/2017   Procedure: OPEN REDUCTION INTERNAL FIXATION (ORIF) ANKLE FRACTURE;  Surgeon: Cammy Copa, MD;  Location: [facility];  Service: Orthopedics;  Laterality: Right;   SHOULDER ARTHROSCOPY Right 2004   frozen shoulder   TONSILLECTOMY  as child   adenoids removed   TRANSANAL HEMORRHOIDAL DEARTERIALIZATION N/A 07/30/2020   Procedure: TRANSANAL HEMORRHOIDAL DEARTERIALIZATION;  Surgeon: Romie Levee, MD;  Location: [facility];  Service: General;  Laterality: N/A;   VAGINAL HYSTERECTOMY  1986   partial   Patient Active Problem List   Diagnosis Date Noted   Asthmatic bronchitis, mild intermittent,  uncomplicated 01/04/2023   Acute sinusitis 01/04/2023   Left lumbar radiculopathy 10/25/2021   Trochanteric bursitis of right hip 08/09/2021   Vitamin D deficiency 08/09/2020   Paresthesia of foot, bilateral 08/09/2020   Neuritis of right ulnar nerve 08/09/2020   Aortic atherosclerosis (HCC) 08/06/2020   Left cervical radiculopathy 10/07/2019   Bilateral low back pain with bilateral sciatica 10/07/2019   Acute conjunctivitis of left eye 01/17/2019   Urinary frequency 01/09/2019   Dysuria 03/28/2018   Trimalleolar fracture 08/20/2017   Ankle fracture 08/20/2017   Mild obstructive sleep apnea 07/30/2017   Right hip pain 07/28/2017   Abnormal TSH 07/21/2015   Greater trochanteric bursitis of right hip 08/04/2014   Localized osteoarthrosis, lower leg 08/04/2014   Biliary colic 08/07/2013    Cholelithiasis 08/07/2013   Hyperlipidemia 02/13/2012   Impaired glucose tolerance 02/13/2012   Diverticulosis 02/11/2012   Degenerative arthritis of hip 02/11/2012   Cervical disc disease 02/11/2012   Allergic rhinitis    Chronic low back pain    Obesity    Blood in stool 08/31/2010   HEMORRHOIDS-INTERNAL 07/21/2010   CONSTIPATION 07/19/2010   ARTHRITIS, KNEE 02/10/2009   VAGINITIS, ATROPHIC 11/11/2008   EXOGENOUS OBESITY 07/29/2008   GERD 08/10/2007   OVERACTIVE BLADDER 08/10/2007    PCP: Corwin Levins, MD REFERRING PROVIDER: Corwin Levins, MD  REFERRING DIAG: R42 (ICD-10-CM) - Vertigo  THERAPY DIAG:  Dizziness and giddiness  Unsteadiness on feet  ONSET DATE: 2 months  Rationale for Evaluation and Treatment: Rehabilitation  SUBJECTIVE:   SUBJECTIVE STATEMENT: Have had the dizziness upon coming up from the side, it lasts for about 30 seconds. Pt accompanied by: self  PERTINENT HISTORY:   PAIN:  Are you having pain? No  PRECAUTIONS: None  RED FLAGS: None   WEIGHT BEARING RESTRICTIONS: No  FALLS: Has patient fallen in last 6 months? No  LIVING ENVIRONMENT: Lives with: lives with their spouse Lives in: House/apartment Stairs: Yes: Internal: flight steps; bilateral but cannot reach both Has following equipment at home: None  PLOF: Independent  PATIENT GOALS: eliminate symptoms  OBJECTIVE:    TODAY'S TREATMENT: 10/26/2023 Activity Comments  L DH Nystagmus noted (seems R rotary? Then horizontal), lasts 30 sec, after brief latent period  R DH Nystagmus noted (seems R rotary? Then horizontal), lasts 15-30 sec, after brief latent period: doesn't last as long as L  R roll No nystagmus, no symptoms  L roll Apogeotrophic x 1 minute with symptoms  Kim maneuver for R Tolerates well, no dizziness         PATIENT EDUCATION: Education details: BPPV-today present in R horizontal cupula-explained rationale for Kim maneuver on R; discussed holding previously  given Brandt-Daroff until we recheck next visit (due to multiple canal involvement) Person educated: Patient Education method: Explanation, Demonstration, and Verbal cues Education comprehension: verbalized understanding  Note: Objective measures were completed at Evaluation unless otherwise noted.  DIAGNOSTIC FINDINGS: n/a for episode  COGNITION: Overall cognitive status: WNL   SENSATION: NT  EDEMA:  none  MUSCLE TONE:  NT  DTRs:  NT  POSTURE:  No Significant postural limitations  Cervical ROM:    WFL  STRENGTH: NT     PATIENT SURVEYS:  FOTO 51  VESTIBULAR ASSESSMENT:  GENERAL OBSERVATION: wears trifocals   SYMPTOM BEHAVIOR:  Subjective history:   Non-Vestibular symptoms: tinnitus  Type of dizziness: Spinning/Vertigo  Frequency: daily  Duration: seconds-minutes, lingering balance issues thereafter  Aggravating factors: Induced by position change: lying supine, rolling to the right,  rolling to the left, and supine to sit and Worse in the morning  Relieving factors: slow movements  Progression of symptoms: unchanged  OCULOMOTOR EXAM:  Ocular Alignment: normal  Ocular ROM: No Limitations  Spontaneous Nystagmus: absent  Gaze-Induced Nystagmus: absent  Smooth Pursuits: intact  Saccades: intact  Convergence/Divergence: 3 cm     VESTIBULAR - OCULAR REFLEX:   Slow VOR: Normal, no symptoms  VOR Cancellation: Normal  Head-Impulse Test: HIT Right: negative HIT Left: negative  Dynamic Visual Acuity: Not able to be assessed   POSITIONAL TESTING: Right Dix-Hallpike: upbeating, right nystagmus Left Dix-Hallpike: no nystagmus Right Roll Test: no nystagmus Left Roll Test: possibly geotropic? Difficult to tell due to excess eye movements  MOTION SENSITIVITY:  Motion Sensitivity Quotient Intensity: 0 = none, 1 = Lightheaded, 2 = Mild, 3 = Moderate, 4 = Severe, 5 = Vomiting  Intensity  1. Sitting to supine   2. Supine to L side   3. Supine to R side    4. Supine to sitting   5. L Hallpike-Dix   6. Up from L    7. R Hallpike-Dix   8. Up from R    9. Sitting, head tipped to L knee   10. Head up from L knee   11. Sitting, head tipped to R knee   12. Head up from R knee   13. Sitting head turns x5   14.Sitting head nods x5   15. In stance, 180 turn to L    16. In stance, 180 turn to R     OTHOSTATICS: not done  FUNCTIONAL GAIT:  FGA: TBD     GOALS: Goals reviewed with patient? Yes  SHORT TERM GOALS: Target date: same as LTG    LONG TERM GOALS: Target date: 11/16/2023    The patient will be independent with HEP for gaze adaptation, habituation, balance, and general mobility. Baseline:  Goal status: INITIAL  2.  Pt to be free of positional dizziness to reduce risk for falls and improve quality of life Baseline:  Goal status: INITIAL  3.  Pt to manifest improved symptoms per expected outcomes FOTO survey of 59 Baseline: 51 Goal status: INITIAL  4.  Demo improved balance and low risk for falls per score 25/28 Functional Gait Assessment Baseline: TBD Goal status: INITIAL    ASSESSMENT:  CLINICAL IMPRESSION: Skilled PT session performed today to address positional vertigo.  With positional testing, pt having notable apogeotrophic nystagmus with L roll test, indicating R horizontal cupulolisthiasis.  Treated with R Kim maneuver, with pt tolerating well.  Upon completion, pt reports feeling better (especially compared to last session), with no symptoms.  Given pt having multi-canal involvement over the past two session, requested pt hold off on Brandt-Daroff at home until we retest positional vertigo next session.  OBJECTIVE IMPAIRMENTS: decreased activity tolerance, decreased balance, difficulty walking, and dizziness.   ACTIVITY LIMITATIONS: bending, stairs, transfers, bed mobility, and locomotion level  PARTICIPATION LIMITATIONS: meal prep, cleaning, laundry, and community activity  PERSONAL FACTORS: Age, Time  since onset of injury/illness/exacerbation, and 1 comorbidity: PMH  are also affecting patient's functional outcome.   REHAB POTENTIAL: Excellent  CLINICAL DECISION MAKING: Stable/uncomplicated  EVALUATION COMPLEXITY: Low   PLAN:  PT FREQUENCY: 2x/week  PT DURATION: 4 weeks  PLANNED INTERVENTIONS: 97110-Therapeutic exercises, 97530- Therapeutic activity, 97112- Neuromuscular re-education, 97535- Self Care, 45409- Manual therapy, [phone]- Gait training, [phone]- Canalith repositioning, Patient/Family education, Balance training, Stair training, Joint mobilization, Spinal mobilization, Vestibular training, and DME instructions  PLAN FOR NEXT SESSION:Recheck for positional vertigo; especially horizontal positions.  Functional Gait Assessment  Lonia Blood, PT 10/26/23 11:00 AM Phone: [phone] Fax: [phone]  [facility] at [facility] [address] [place], [address] [place], [place] [address] Phone # [phone] Fax # [phone]

## 2023-10-30 ENCOUNTER — Encounter: Payer: Self-pay | Admitting: Physical Therapy

## 2023-10-30 ENCOUNTER — Ambulatory Visit: Payer: Medicare Other | Admitting: Physical Therapy

## 2023-10-30 DIAGNOSIS — R42 Dizziness and giddiness: Secondary | ICD-10-CM | POA: Diagnosis not present

## 2023-10-30 DIAGNOSIS — R2681 Unsteadiness on feet: Secondary | ICD-10-CM | POA: Diagnosis not present

## 2023-10-30 NOTE — Therapy (Signed)
OUTPATIENT PHYSICAL THERAPY VESTIBULAR TREATMENT     Patient Name: [name] MRN: [id number] DOB:10-Jan-1951, 72 y.o., female Today's Date: 10/30/2023  END OF SESSION:  PT End of Session - 10/30/23 1020     Visit Number 4    Number of Visits 8    Date for PT Re-Evaluation 11/16/23    Authorization Type Medicare    PT Start Time 1020    PT Stop Time 1100    PT Time Calculation (min) 40 min    Activity Tolerance Patient tolerated treatment well    Behavior During Therapy [facility] for tasks assessed/performed               Past Medical History:  Diagnosis Date   Abnormal Pap smear of vagina 06/17/15   LGSIL   Allergic rhinitis, cause unspecified    Arthritis    ARTHRITIS, KNEE 02/10/2009   Qualifier: Diagnosis of  By: Alphonzo Severance MD, Willie R    BRCA negative 03/25/13   Cervical cancer (HCC) 1984   Cervical disc disease 02/11/2012   Chronic lower back pain    Degenerative arthritis of hip 02/11/2012   Diverticulosis 02/11/2012   GERD 08/10/2007   Qualifier: Diagnosis of  By: Linna Darner, CMA, Cindy     GERD (gastroesophageal reflux disease)    GERD (gastroesophageal reflux disease)    Hemorrhoids    HEMORRHOIDS-INTERNAL 07/21/2010   Qualifier: Diagnosis of  By: Wilmon Pali NP, Paula     Hiatal hernia    Hyperlipidemia 02/13/2012   Mitral valve prolapse    no cardiologist, mild   OAB (overactive bladder)    Obesity    OVERACTIVE BLADDER 08/10/2007   Qualifier: Diagnosis of  By: Linna Darner, CMA, Cindy     Pneumonia 2017   PONV (postoperative nausea and vomiting)    yrs ago unable to raise arms after female surgery, no recent problems   Recurrent UTI    Sleep apnea    uses cpap set on 2   SUI (stress urinary incontinence, female)    Urinary incontinence    VAGINITIS, ATROPHIC 11/11/2008   Qualifier: Diagnosis of  By: Alphonzo Severance MD, Loni Dolly    Past Surgical History:  Procedure Laterality Date   bladder tack     x 3 or 4   BUNIONECTOMY Right 2007   CERVICAL DISCECTOMY  2002    fusion and plate C 3   CHOLECYSTECTOMY N/A 08/13/2013   Procedure: LAPAROSCOPIC CHOLECYSTECTOMY WITH INTRAOPERATIVE CHOLANGIOGRAM;  Surgeon: Kandis Cocking, MD;  Location: [facility];  Service: General;  Laterality: N/A;   CLOSED REDUCTION METATARSAL FRACTURE     rt side 06-03-18, left side 08-19-17   COLONOSCOPY  2018   conization of cervix  1982   with D&C   CYSTOCELE REPAIR  2003   rectocele repair   EYE SURGERY Bilateral yrs ago   lasik   ORIF ANKLE FRACTURE Right 08/20/2017   Procedure: OPEN REDUCTION INTERNAL FIXATION (ORIF) ANKLE FRACTURE;  Surgeon: Cammy Copa, MD;  Location: [facility];  Service: Orthopedics;  Laterality: Right;   SHOULDER ARTHROSCOPY Right 2004   frozen shoulder   TONSILLECTOMY  as child   adenoids removed   TRANSANAL HEMORRHOIDAL DEARTERIALIZATION N/A 07/30/2020   Procedure: TRANSANAL HEMORRHOIDAL DEARTERIALIZATION;  Surgeon: Romie Levee, MD;  Location: [facility];  Service: General;  Laterality: N/A;   VAGINAL HYSTERECTOMY  1986   partial   Patient Active Problem List   Diagnosis Date Noted   Asthmatic bronchitis, mild  intermittent, uncomplicated 01/04/2023   Acute sinusitis 01/04/2023   Left lumbar radiculopathy 10/25/2021   Trochanteric bursitis of right hip 08/09/2021   Vitamin D deficiency 08/09/2020   Paresthesia of foot, bilateral 08/09/2020   Neuritis of right ulnar nerve 08/09/2020   Aortic atherosclerosis (HCC) 08/06/2020   Left cervical radiculopathy 10/07/2019   Bilateral low back pain with bilateral sciatica 10/07/2019   Acute conjunctivitis of left eye 01/17/2019   Urinary frequency 01/09/2019   Dysuria 03/28/2018   Trimalleolar fracture 08/20/2017   Ankle fracture 08/20/2017   Mild obstructive sleep apnea 07/30/2017   Right hip pain 07/28/2017   Abnormal TSH 07/21/2015   Greater trochanteric bursitis of right hip 08/04/2014   Localized osteoarthrosis, lower leg 08/04/2014   Biliary colic 08/07/2013    Cholelithiasis 08/07/2013   Hyperlipidemia 02/13/2012   Impaired glucose tolerance 02/13/2012   Diverticulosis 02/11/2012   Degenerative arthritis of hip 02/11/2012   Cervical disc disease 02/11/2012   Allergic rhinitis    Chronic low back pain    Obesity    Blood in stool 08/31/2010   HEMORRHOIDS-INTERNAL 07/21/2010   CONSTIPATION 07/19/2010   ARTHRITIS, KNEE 02/10/2009   VAGINITIS, ATROPHIC 11/11/2008   EXOGENOUS OBESITY 07/29/2008   GERD 08/10/2007   OVERACTIVE BLADDER 08/10/2007    PCP: Corwin Levins, MD REFERRING PROVIDER: Corwin Levins, MD  REFERRING DIAG: R42 (ICD-10-CM) - Vertigo  THERAPY DIAG:  Dizziness and giddiness  ONSET DATE: 2 months  Rationale for Evaluation and Treatment: Rehabilitation  SUBJECTIVE:   SUBJECTIVE STATEMENT: Not nearly as bad when I sit up.  Still lasts about 30 seconds, but not as bad. Pt accompanied by: self  PERTINENT HISTORY:   PAIN:  Are you having pain? No  PRECAUTIONS: None  RED FLAGS: None   WEIGHT BEARING RESTRICTIONS: No  FALLS: Has patient fallen in last 6 months? No  LIVING ENVIRONMENT: Lives with: lives with their spouse Lives in: House/apartment Stairs: Yes: Internal: flight steps; bilateral but cannot reach both Has following equipment at home: None  PLOF: Independent  PATIENT GOALS: eliminate symptoms  OBJECTIVE:    TODAY'S TREATMENT: 10/30/2023 Activity Comments  L DH Nystagmus present(appears horizontal apo), 15 seconds after latency; dizzy upon sitting  R DH Nystagmus appears/goes away then reappears (L rotary?)  R horizontal negative  L horizontal Apogeotrophic nystagmus x 30-45 sec  Kim maneuver for R Tolerates well, some dizziness upon L roll position; no dizziness upon sitting EOM at end of maneuver  L horizontal roll Apogeotrophic nystagmus x 30 sec  Kim maneuver for R Tolerates well, no dizziness any position or upon sitting EOM          PATIENT EDUCATION: Education details:  BPPV-explained rational for treatments and updated HEP to include supine<>L roll for habituation Person educated: Patient Education method: Explanation, Verbal cues, and Handouts Education comprehension: verbalized understanding  Access Code: [email or web address] URL: [email or web address] Date: 10/30/2023 Prepared by: [facility] - Outpatient  Rehab - [facility]  Exercises - Supine to Left Sidelying Vestibular Habituation  - 1-2 x daily - 7 x weekly - 1 sets - 3 reps - 30 sec hold  ------------------------------------------------------------------------- Note: Objective measures were completed at Evaluation unless otherwise noted.  DIAGNOSTIC FINDINGS: n/a for episode  COGNITION: Overall cognitive status: WNL   SENSATION: NT  EDEMA:  none  MUSCLE TONE:  NT  DTRs:  NT  POSTURE:  No Significant postural limitations  Cervical ROM:    WFL  STRENGTH: NT     PATIENT SURVEYS:  FOTO 51  VESTIBULAR ASSESSMENT:  GENERAL OBSERVATION: wears trifocals   SYMPTOM BEHAVIOR:  Subjective history:   Non-Vestibular symptoms: tinnitus  Type of dizziness: Spinning/Vertigo  Frequency: daily  Duration: seconds-minutes, lingering balance issues thereafter  Aggravating factors: Induced by position change: lying supine, rolling to the right, rolling to the left, and supine to sit and Worse in the morning  Relieving factors: slow movements  Progression of symptoms: unchanged  OCULOMOTOR EXAM:  Ocular Alignment: normal  Ocular ROM: No Limitations  Spontaneous Nystagmus: absent  Gaze-Induced Nystagmus: absent  Smooth Pursuits: intact  Saccades: intact  Convergence/Divergence: 3 cm     VESTIBULAR - OCULAR REFLEX:   Slow VOR: Normal, no symptoms  VOR Cancellation: Normal  Head-Impulse Test: HIT Right: negative HIT Left: negative  Dynamic Visual Acuity: Not able to be assessed   POSITIONAL TESTING: Right Dix-Hallpike: upbeating, right nystagmus Left  Dix-Hallpike: no nystagmus Right Roll Test: no nystagmus Left Roll Test: possibly geotropic? Difficult to tell due to excess eye movements  MOTION SENSITIVITY:  Motion Sensitivity Quotient Intensity: 0 = none, 1 = Lightheaded, 2 = Mild, 3 = Moderate, 4 = Severe, 5 = Vomiting  Intensity  1. Sitting to supine   2. Supine to L side   3. Supine to R side   4. Supine to sitting   5. L Hallpike-Dix   6. Up from L    7. R Hallpike-Dix   8. Up from R    9. Sitting, head tipped to L knee   10. Head up from L knee   11. Sitting, head tipped to R knee   12. Head up from R knee   13. Sitting head turns x5   14.Sitting head nods x5   15. In stance, 180 turn to L    16. In stance, 180 turn to R     OTHOSTATICS: not done  FUNCTIONAL GAIT:  FGA: TBD     GOALS: Goals reviewed with patient? Yes  SHORT TERM GOALS: Target date: same as LTG    LONG TERM GOALS: Target date: 11/16/2023    The patient will be independent with HEP for gaze adaptation, habituation, balance, and general mobility. Baseline:  Goal status: INITIAL  2.  Pt to be free of positional dizziness to reduce risk for falls and improve quality of life Baseline:  Goal status: INITIAL  3.  Pt to manifest improved symptoms per expected outcomes FOTO survey of 59 Baseline: 51 Goal status: INITIAL  4.  Demo improved balance and low risk for falls per score 25/28 Functional Gait Assessment Baseline: TBD Goal status: INITIAL    ASSESSMENT:  CLINICAL IMPRESSION: Pt presents today and does report that she feels better, not as much dizziness when she gets up from bed.  Skilled PT session today began by assessing positional vertigo.  Nystagmus is present with both L and R DH, but more in a horizontal plane.  She notes seeing double and has nystagmus in R DH position, longer than L.  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  She does have decreased time of symptoms, but still present.  Treated x 2 in session today with Kim maneuver, with pt having no dizziness symptoms at end of session. Educated in horizontal roll for habituation, as this is the position that pt feels brings on symptoms at home.  She will continue to benefit from skilled PT towards goals for improved  dizziness.  OBJECTIVE IMPAIRMENTS: decreased activity tolerance, decreased balance, difficulty walking, and dizziness.   ACTIVITY LIMITATIONS: bending, stairs, transfers, bed mobility, and locomotion level  PARTICIPATION LIMITATIONS: meal prep, cleaning, laundry, and community activity  PERSONAL FACTORS: Age, Time since onset of injury/illness/exacerbation, and 1 comorbidity: PMH  are also affecting patient's functional outcome.   REHAB POTENTIAL: Excellent  CLINICAL DECISION MAKING: Stable/uncomplicated  EVALUATION COMPLEXITY: Low   PLAN:  PT FREQUENCY: 2x/week  PT DURATION: 4 weeks  PLANNED INTERVENTIONS: 97110-Therapeutic exercises, 97530- Therapeutic activity, 97112- Neuromuscular re-education, 97535- Self Care, 51884- Manual therapy, [phone]- Gait training, [phone]- Canalith repositioning, Patient/Family education, Balance training, Stair training, Joint mobilization, Spinal mobilization, Vestibular training, and DME instructions  PLAN FOR NEXT SESSION: Recheck for positional vertigo and treat as appropriate; especially horizontal positions.  Functional Gait Assessment  Lonia Blood, PT 10/30/23 11:52 AM Phone: [phone] Fax: [phone]  [facility] Rehab at [facility] [address] [place], [address] [place], [place] [address] Phone # [phone] Fax # [phone]

## 2023-11-02 ENCOUNTER — Encounter: Payer: Medicare Other | Admitting: Physical Therapy

## 2023-11-03 ENCOUNTER — Ambulatory Visit: Payer: Medicare Other

## 2023-11-03 DIAGNOSIS — R42 Dizziness and giddiness: Secondary | ICD-10-CM

## 2023-11-03 DIAGNOSIS — R2681 Unsteadiness on feet: Secondary | ICD-10-CM | POA: Diagnosis not present

## 2023-11-03 NOTE — Therapy (Signed)
OUTPATIENT PHYSICAL THERAPY VESTIBULAR TREATMENT     Patient Name: [name] MRN: [id number] DOB:12/07/1950, 72 y.o., female Today's Date: 11/03/2023  END OF SESSION:  PT End of Session - 11/03/23 1102     Visit Number 5    Number of Visits 8    Date for PT Re-Evaluation 11/16/23    Authorization Type Medicare    PT Start Time 1100    PT Stop Time 1145    PT Time Calculation (min) 45 min    Activity Tolerance Patient tolerated treatment well    Behavior During Therapy [facility] for tasks assessed/performed               Past Medical History:  Diagnosis Date   Abnormal Pap smear of vagina 06/17/15   LGSIL   Allergic rhinitis, cause unspecified    Arthritis    ARTHRITIS, KNEE 02/10/2009   Qualifier: Diagnosis of  By: Alphonzo Severance MD, Willie R    BRCA negative 03/25/13   Cervical cancer (HCC) 1984   Cervical disc disease 02/11/2012   Chronic lower back pain    Degenerative arthritis of hip 02/11/2012   Diverticulosis 02/11/2012   GERD 08/10/2007   Qualifier: Diagnosis of  By: Linna Darner, CMA, Cindy     GERD (gastroesophageal reflux disease)    GERD (gastroesophageal reflux disease)    Hemorrhoids    HEMORRHOIDS-INTERNAL 07/21/2010   Qualifier: Diagnosis of  By: Wilmon Pali NP, Paula     Hiatal hernia    Hyperlipidemia 02/13/2012   Mitral valve prolapse    no cardiologist, mild   OAB (overactive bladder)    Obesity    OVERACTIVE BLADDER 08/10/2007   Qualifier: Diagnosis of  By: Linna Darner, CMA, Cindy     Pneumonia 2017   PONV (postoperative nausea and vomiting)    yrs ago unable to raise arms after female surgery, no recent problems   Recurrent UTI    Sleep apnea    uses cpap set on 2   SUI (stress urinary incontinence, female)    Urinary incontinence    VAGINITIS, ATROPHIC 11/11/2008   Qualifier: Diagnosis of  By: Alphonzo Severance MD, Loni Dolly    Past Surgical History:  Procedure Laterality Date   bladder tack     x 3 or 4   BUNIONECTOMY Right 2007   CERVICAL DISCECTOMY  2002    fusion and plate C 3   CHOLECYSTECTOMY N/A 08/13/2013   Procedure: LAPAROSCOPIC CHOLECYSTECTOMY WITH INTRAOPERATIVE CHOLANGIOGRAM;  Surgeon: Kandis Cocking, MD;  Location: [facility] OR;  Service: General;  Laterality: N/A;   CLOSED REDUCTION METATARSAL FRACTURE     rt side 06-03-18, left side 08-19-17   COLONOSCOPY  2018   conization of cervix  1982   with D&C   CYSTOCELE REPAIR  2003   rectocele repair   EYE SURGERY Bilateral yrs ago   lasik   ORIF ANKLE FRACTURE Right 08/20/2017   Procedure: OPEN REDUCTION INTERNAL FIXATION (ORIF) ANKLE FRACTURE;  Surgeon: Cammy Copa, MD;  Location: [facility];  Service: Orthopedics;  Laterality: Right;   SHOULDER ARTHROSCOPY Right 2004   frozen shoulder   TONSILLECTOMY  as child   adenoids removed   TRANSANAL HEMORRHOIDAL DEARTERIALIZATION N/A 07/30/2020   Procedure: TRANSANAL HEMORRHOIDAL DEARTERIALIZATION;  Surgeon: Romie Levee, MD;  Location: [facility];  Service: General;  Laterality: N/A;   VAGINAL HYSTERECTOMY  1986   partial   Patient Active Problem List   Diagnosis Date Noted   Asthmatic bronchitis, mild  intermittent, uncomplicated 01/04/2023   Acute sinusitis 01/04/2023   Left lumbar radiculopathy 10/25/2021   Trochanteric bursitis of right hip 08/09/2021   Vitamin D deficiency 08/09/2020   Paresthesia of foot, bilateral 08/09/2020   Neuritis of right ulnar nerve 08/09/2020   Aortic atherosclerosis (HCC) 08/06/2020   Left cervical radiculopathy 10/07/2019   Bilateral low back pain with bilateral sciatica 10/07/2019   Acute conjunctivitis of left eye 01/17/2019   Urinary frequency 01/09/2019   Dysuria 03/28/2018   Trimalleolar fracture 08/20/2017   Ankle fracture 08/20/2017   Mild obstructive sleep apnea 07/30/2017   Right hip pain 07/28/2017   Abnormal TSH 07/21/2015   Greater trochanteric bursitis of right hip 08/04/2014   Localized osteoarthrosis, lower leg 08/04/2014   Biliary colic 08/07/2013    Cholelithiasis 08/07/2013   Hyperlipidemia 02/13/2012   Impaired glucose tolerance 02/13/2012   Diverticulosis 02/11/2012   Degenerative arthritis of hip 02/11/2012   Cervical disc disease 02/11/2012   Allergic rhinitis    Chronic low back pain    Obesity    Blood in stool 08/31/2010   HEMORRHOIDS-INTERNAL 07/21/2010   CONSTIPATION 07/19/2010   ARTHRITIS, KNEE 02/10/2009   VAGINITIS, ATROPHIC 11/11/2008   EXOGENOUS OBESITY 07/29/2008   GERD 08/10/2007   OVERACTIVE BLADDER 08/10/2007    PCP: Corwin Levins, MD REFERRING PROVIDER: Corwin Levins, MD  REFERRING DIAG: R42 (ICD-10-CM) - Vertigo  THERAPY DIAG:  Dizziness and giddiness  Unsteadiness on feet  ONSET DATE: 2 months  Rationale for Evaluation and Treatment: Rehabilitation  SUBJECTIVE:   SUBJECTIVE STATEMENT: Noting the dizziness has improved, not as obvious when changing position Pt accompanied by: self  PERTINENT HISTORY:   PAIN:  Are you having pain? No  PRECAUTIONS: None  RED FLAGS: None   WEIGHT BEARING RESTRICTIONS: No  FALLS: Has patient fallen in last 6 months? No  LIVING ENVIRONMENT: Lives with: lives with their spouse Lives in: House/apartment Stairs: Yes: Internal: flight steps; bilateral but cannot reach both Has following equipment at home: None  PLOF: Independent  PATIENT GOALS: eliminate symptoms  OBJECTIVE:   TODAY'S TREATMENT: 11/03/23 Activity Comments  Right roll negative  Left roll Apogeotropic x 45 sec  Left dix-hallpike 3-4 beats left upbeating  Right Dix-Hallpike apogeotropic  Right Kim Maneuver   Left roll apogeo  Right Kim Maneuver   Left roll Apogeo nystagmus      TODAY'S TREATMENT: 10/30/2023 Activity Comments  L DH Nystagmus present(appears horizontal apo), 15 seconds after latency; dizzy upon sitting  R DH Nystagmus appears/goes away then reappears (L rotary?)  R horizontal negative  L horizontal Apogeotrophic nystagmus x 30-45 sec  Kim maneuver for R  Tolerates well, some dizziness upon L roll position; no dizziness upon sitting EOM at end of maneuver  L horizontal roll Apogeotrophic nystagmus x 30 sec  Kim maneuver for R Tolerates well, no dizziness any position or upon sitting EOM          PATIENT EDUCATION: Education details: BPPV-explained rational for treatments and updated HEP to include supine<>L roll for habituation Person educated: Patient Education method: Explanation, Verbal cues, and Handouts Education comprehension: verbalized understanding  Access Code: [email or web address] URL: [email or web address] Date: 10/30/2023 Prepared by: [facility] - Outpatient  Rehab - [facility]  Exercises - Supine to Left Sidelying Vestibular Habituation  - 1-2 x daily - 7 x weekly - 1 sets - 3 reps - 30 sec hold  ------------------------------------------------------------------------- Note: Objective measures were completed at Evaluation unless otherwise noted.  DIAGNOSTIC FINDINGS: n/a for  episode  COGNITION: Overall cognitive status: WNL   SENSATION: NT  EDEMA:  none  MUSCLE TONE:  NT  DTRs:  NT  POSTURE:  No Significant postural limitations  Cervical ROM:    WFL  STRENGTH: NT     PATIENT SURVEYS:  FOTO 51  VESTIBULAR ASSESSMENT:  GENERAL OBSERVATION: wears trifocals   SYMPTOM BEHAVIOR:  Subjective history:   Non-Vestibular symptoms: tinnitus  Type of dizziness: Spinning/Vertigo  Frequency: daily  Duration: seconds-minutes, lingering balance issues thereafter  Aggravating factors: Induced by position change: lying supine, rolling to the right, rolling to the left, and supine to sit and Worse in the morning  Relieving factors: slow movements  Progression of symptoms: unchanged  OCULOMOTOR EXAM:  Ocular Alignment: normal  Ocular ROM: No Limitations  Spontaneous Nystagmus: absent  Gaze-Induced Nystagmus: absent  Smooth Pursuits: intact  Saccades: intact  Convergence/Divergence: 3 cm      VESTIBULAR - OCULAR REFLEX:   Slow VOR: Normal, no symptoms  VOR Cancellation: Normal  Head-Impulse Test: HIT Right: negative HIT Left: negative  Dynamic Visual Acuity: Not able to be assessed   POSITIONAL TESTING: Right Dix-Hallpike: upbeating, right nystagmus Left Dix-Hallpike: no nystagmus Right Roll Test: no nystagmus Left Roll Test: possibly geotropic? Difficult to tell due to excess eye movements  MOTION SENSITIVITY:  Motion Sensitivity Quotient Intensity: 0 = none, 1 = Lightheaded, 2 = Mild, 3 = Moderate, 4 = Severe, 5 = Vomiting  Intensity  1. Sitting to supine   2. Supine to L side   3. Supine to R side   4. Supine to sitting   5. L Hallpike-Dix   6. Up from L    7. R Hallpike-Dix   8. Up from R    9. Sitting, head tipped to L knee   10. Head up from L knee   11. Sitting, head tipped to R knee   12. Head up from R knee   13. Sitting head turns x5   14.Sitting head nods x5   15. In stance, 180 turn to L    16. In stance, 180 turn to R     OTHOSTATICS: not done  FUNCTIONAL GAIT:  FGA: TBD     GOALS: Goals reviewed with patient? Yes  SHORT TERM GOALS: Target date: same as LTG    LONG TERM GOALS: Target date: 11/16/2023    The patient will be independent with HEP for gaze adaptation, habituation, balance, and general mobility. Baseline:  Goal status: INITIAL  2.  Pt to be free of positional dizziness to reduce risk for falls and improve quality of life Baseline:  Goal status: INITIAL  3.  Pt to manifest improved symptoms per expected outcomes FOTO survey of 59 Baseline: 51 Goal status: INITIAL  4.  Demo improved balance and low risk for falls per score 25/28 Functional Gait Assessment Baseline: TBD Goal status: INITIAL    ASSESSMENT:  CLINICAL IMPRESSION: Continuing to experience positional vertigo but notes overall reduced intensity.  Positional tests reveal apogeotropic nystagmus left roll > right roll.  Thus, trials of Kim  maneuver to right ear to hopefully dislodge otoconia and performing repositioning of debris.  So far, attempts have not yielded positive results for this involvement.  Recommended use of vibrating massager at home to periodically perform vibration to right mastoid periodically throughout the day with varying head positions to see if this helps to libertate. Continued sessions to progress details and hopefully become amenable to canalith repositioning.  OBJECTIVE IMPAIRMENTS: decreased  activity tolerance, decreased balance, difficulty walking, and dizziness.   ACTIVITY LIMITATIONS: bending, stairs, transfers, bed mobility, and locomotion level  PARTICIPATION LIMITATIONS: meal prep, cleaning, laundry, and community activity  PERSONAL FACTORS: Age, Time since onset of injury/illness/exacerbation, and 1 comorbidity: PMH  are also affecting patient's functional outcome.   REHAB POTENTIAL: Excellent  CLINICAL DECISION MAKING: Stable/uncomplicated  EVALUATION COMPLEXITY: Low   PLAN:  PT FREQUENCY: 2x/week  PT DURATION: 4 weeks  PLANNED INTERVENTIONS: 97110-Therapeutic exercises, 97530- Therapeutic activity, 97112- Neuromuscular re-education, 97535- Self Care, [id number]- Manual therapy, [phone]- Gait training, [phone]- Canalith repositioning, Patient/Family education, Balance training, Stair training, Joint mobilization, Spinal mobilization, Vestibular training, and DME instructions  PLAN FOR NEXT SESSION: Recheck for positional vertigo and treat as appropriate; especially horizontal positions.  Functional Gait Assessment  11:49 AM, 11/03/23 M. Shary Decamp, PT, DPT Physical Therapist- [place] Office Number: [phone]

## 2023-11-06 ENCOUNTER — Encounter: Payer: Self-pay | Admitting: Physical Therapy

## 2023-11-06 ENCOUNTER — Ambulatory Visit: Payer: Medicare Other | Admitting: Physical Therapy

## 2023-11-06 DIAGNOSIS — R42 Dizziness and giddiness: Secondary | ICD-10-CM

## 2023-11-06 DIAGNOSIS — R2681 Unsteadiness on feet: Secondary | ICD-10-CM

## 2023-11-06 NOTE — Therapy (Signed)
OUTPATIENT PHYSICAL THERAPY VESTIBULAR TREATMENT     Patient Name: [name] MRN: [id number] DOB:Mar 07, 1951, 72 y.o., female Today's Date: 11/06/2023  END OF SESSION:  PT End of Session - 11/06/23 1539     Visit Number 6    Number of Visits 8    Date for PT Re-Evaluation 11/16/23    Authorization Type Medicare    PT Start Time 1537    PT Stop Time 1615    PT Time Calculation (min) 38 min    Activity Tolerance Patient tolerated treatment well    Behavior During Therapy [facility] for tasks assessed/performed                Past Medical History:  Diagnosis Date   Abnormal Pap smear of vagina 06/17/15   LGSIL   Allergic rhinitis, cause unspecified    Arthritis    ARTHRITIS, KNEE 02/10/2009   Qualifier: Diagnosis of  By: Alphonzo Severance MD, Willie R    BRCA negative 03/25/13   Cervical cancer (HCC) 1984   Cervical disc disease 02/11/2012   Chronic lower back pain    Degenerative arthritis of hip 02/11/2012   Diverticulosis 02/11/2012   GERD 08/10/2007   Qualifier: Diagnosis of  By: Linna Darner, CMA, Cindy     GERD (gastroesophageal reflux disease)    GERD (gastroesophageal reflux disease)    Hemorrhoids    HEMORRHOIDS-INTERNAL 07/21/2010   Qualifier: Diagnosis of  By: Wilmon Pali NP, Paula     Hiatal hernia    Hyperlipidemia 02/13/2012   Mitral valve prolapse    no cardiologist, mild   OAB (overactive bladder)    Obesity    OVERACTIVE BLADDER 08/10/2007   Qualifier: Diagnosis of  By: Linna Darner, CMA, Cindy     Pneumonia 2017   PONV (postoperative nausea and vomiting)    yrs ago unable to raise arms after female surgery, no recent problems   Recurrent UTI    Sleep apnea    uses cpap set on 2   SUI (stress urinary incontinence, female)    Urinary incontinence    VAGINITIS, ATROPHIC 11/11/2008   Qualifier: Diagnosis of  By: Alphonzo Severance MD, Loni Dolly    Past Surgical History:  Procedure Laterality Date   bladder tack     x 3 or 4   BUNIONECTOMY Right 2007   CERVICAL DISCECTOMY  2002    fusion and plate C 3   CHOLECYSTECTOMY N/A 08/13/2013   Procedure: LAPAROSCOPIC CHOLECYSTECTOMY WITH INTRAOPERATIVE CHOLANGIOGRAM;  Surgeon: Kandis Cocking, MD;  Location: [facility] OR;  Service: General;  Laterality: N/A;   CLOSED REDUCTION METATARSAL FRACTURE     rt side 06-03-18, left side 08-19-17   COLONOSCOPY  2018   conization of cervix  1982   with D&C   CYSTOCELE REPAIR  2003   rectocele repair   EYE SURGERY Bilateral yrs ago   lasik   ORIF ANKLE FRACTURE Right 08/20/2017   Procedure: OPEN REDUCTION INTERNAL FIXATION (ORIF) ANKLE FRACTURE;  Surgeon: Cammy Copa, MD;  Location: [facility];  Service: Orthopedics;  Laterality: Right;   SHOULDER ARTHROSCOPY Right 2004   frozen shoulder   TONSILLECTOMY  as child   adenoids removed   TRANSANAL HEMORRHOIDAL DEARTERIALIZATION N/A 07/30/2020   Procedure: TRANSANAL HEMORRHOIDAL DEARTERIALIZATION;  Surgeon: Romie Levee, MD;  Location: [facility];  Service: General;  Laterality: N/A;   VAGINAL HYSTERECTOMY  1986   partial   Patient Active Problem List   Diagnosis Date Noted   Asthmatic bronchitis,  mild intermittent, uncomplicated 01/04/2023   Acute sinusitis 01/04/2023   Left lumbar radiculopathy 10/25/2021   Trochanteric bursitis of right hip 08/09/2021   Vitamin D deficiency 08/09/2020   Paresthesia of foot, bilateral 08/09/2020   Neuritis of right ulnar nerve 08/09/2020   Aortic atherosclerosis (HCC) 08/06/2020   Left cervical radiculopathy 10/07/2019   Bilateral low back pain with bilateral sciatica 10/07/2019   Acute conjunctivitis of left eye 01/17/2019   Urinary frequency 01/09/2019   Dysuria 03/28/2018   Trimalleolar fracture 08/20/2017   Ankle fracture 08/20/2017   Mild obstructive sleep apnea 07/30/2017   Right hip pain 07/28/2017   Abnormal TSH 07/21/2015   Greater trochanteric bursitis of right hip 08/04/2014   Localized osteoarthrosis, lower leg 08/04/2014   Biliary colic 08/07/2013    Cholelithiasis 08/07/2013   Hyperlipidemia 02/13/2012   Impaired glucose tolerance 02/13/2012   Diverticulosis 02/11/2012   Degenerative arthritis of hip 02/11/2012   Cervical disc disease 02/11/2012   Allergic rhinitis    Chronic low back pain    Obesity    Blood in stool 08/31/2010   HEMORRHOIDS-INTERNAL 07/21/2010   CONSTIPATION 07/19/2010   ARTHRITIS, KNEE 02/10/2009   VAGINITIS, ATROPHIC 11/11/2008   EXOGENOUS OBESITY 07/29/2008   GERD 08/10/2007   OVERACTIVE BLADDER 08/10/2007    PCP: Corwin Levins, MD REFERRING PROVIDER: Corwin Levins, MD  REFERRING DIAG: R42 (ICD-10-CM) - Vertigo  THERAPY DIAG:  Dizziness and giddiness  Unsteadiness on feet  ONSET DATE: 2 months  Rationale for Evaluation and Treatment: Rehabilitation  SUBJECTIVE:   SUBJECTIVE STATEMENT: No dizziness with rolling or bed mobility since last PT session.  Have been trying to use some vibration at my R ear-maybe that is helping.  Did have one episode this morning when I was standing and I was dizzy, mostly in the L eye. Pt accompanied by: self  PERTINENT HISTORY:   PAIN:  Are you having pain? No  PRECAUTIONS: None  RED FLAGS: None   WEIGHT BEARING RESTRICTIONS: No  FALLS: Has patient fallen in last 6 months? No  LIVING ENVIRONMENT: Lives with: lives with their spouse Lives in: House/apartment Stairs: Yes: Internal: flight steps; bilateral but cannot reach both Has following equipment at home: None  PLOF: Independent  PATIENT GOALS: eliminate symptoms  OBJECTIVE:    TODAY'S TREATMENT: 11/06/2023 Activity Comments  Right roll Negative   Left roll Negative  L Dix Hallpike Negative, slight dizziness upon return to sit  R [employer] Negative, no dizziness  L Dix Hallpike Horizontal nystagmus, some c/o blurriness no dizziness or spinning  L Epley Good tolerance, does report blurring in position 3.  No dizziness upon sitting EOM  FGA 20/30 See below       M-CTSIB  Condition  1: Firm Surface, EO 30 Sec, Normal Sway  Condition 2: Firm Surface, EC 30 Sec, Mild Sway  Condition 3: Foam Surface, EO 30 Sec, Mild Sway  Condition 4: Foam Surface, EC 30 Sec, Moderate Sway     [facility] PT Assessment - 11/06/23 1606       Functional Gait  Assessment   Gait assessed  Yes    Gait Level Surface Walks 20 ft, slow speed, abnormal gait pattern, evidence for imbalance or deviates 10-15 in outside of the 12 in walkway width. Requires more than 7 sec to ambulate 20 ft.   8.56   Change in Gait Speed Able to change speed, demonstrates mild gait deviations, deviates 6-10 in outside of the 12 in walkway width, or  no gait deviations, unable to achieve a major change in velocity, or uses a change in velocity, or uses an assistive device.    Gait with Horizontal Head Turns Performs head turns with moderate changes in gait velocity, slows down, deviates 10-15 in outside 12 in walkway width but recovers, can continue to walk.   11.16   Gait with Vertical Head Turns Performs task with slight change in gait velocity (eg, minor disruption to smooth gait path), deviates 6 - 10 in outside 12 in walkway width or uses assistive device   9.93   Gait and Pivot Turn Pivot turns safely within 3 sec and stops quickly with no loss of balance.    Step Over Obstacle Is able to step over one shoe box (4.5 in total height) without changing gait speed. No evidence of imbalance.    Gait with Narrow Base of Support Ambulates 7-9 steps.    Gait with Eyes Closed Walks 20 ft, slow speed, abnormal gait pattern, evidence for imbalance, deviates 10-15 in outside 12 in walkway width. Requires more than 9 sec to ambulate 20 ft.   10.41 sec, veers to L   Ambulating Backwards Walks 20 ft, no assistive devices, good speed, no evidence for imbalance, normal gait   12 sec   Steps Alternating feet, no rail.    Total Score 20    FGA comment: Scores <22/28 indicate increased fall risk               PATIENT  EDUCATION: Education details: Progress with BPPV testing, no apparent horizontal canal BPPV today; reviewed roll habituation; discussed balance measures and fall risk/plans to address with vestibular system training for balance Person educated: Patient Education method: Explanation, Verbal cues, and Handouts Education comprehension: verbalized understanding  Access Code: [email or web address] URL: [email or web address] Date: 10/30/2023 Prepared by: [facility] - Outpatient  Rehab - [facility]  Exercises - Supine to Left Sidelying Vestibular Habituation  - 1-2 x daily - 7 x weekly - 1 sets - 3 reps - 30 sec hold  ------------------------------------------------------------------------- Note: Objective measures were completed at Evaluation unless otherwise noted.  DIAGNOSTIC FINDINGS: n/a for episode  COGNITION: Overall cognitive status: WNL   SENSATION: NT  EDEMA:  none  MUSCLE TONE:  NT  DTRs:  NT  POSTURE:  No Significant postural limitations  Cervical ROM:    WFL  STRENGTH: NT     PATIENT SURVEYS:  FOTO 51  VESTIBULAR ASSESSMENT:  GENERAL OBSERVATION: wears trifocals   SYMPTOM BEHAVIOR:  Subjective history:   Non-Vestibular symptoms: tinnitus  Type of dizziness: Spinning/Vertigo  Frequency: daily  Duration: seconds-minutes, lingering balance issues thereafter  Aggravating factors: Induced by position change: lying supine, rolling to the right, rolling to the left, and supine to sit and Worse in the morning  Relieving factors: slow movements  Progression of symptoms: unchanged  OCULOMOTOR EXAM:  Ocular Alignment: normal  Ocular ROM: No Limitations  Spontaneous Nystagmus: absent  Gaze-Induced Nystagmus: absent  Smooth Pursuits: intact  Saccades: intact  Convergence/Divergence: 3 cm     VESTIBULAR - OCULAR REFLEX:   Slow VOR: Normal, no symptoms  VOR Cancellation: Normal  Head-Impulse Test: HIT Right: negative HIT Left:  negative  Dynamic Visual Acuity: Not able to be assessed   POSITIONAL TESTING: Right Dix-Hallpike: upbeating, right nystagmus Left Dix-Hallpike: no nystagmus Right Roll Test: no nystagmus Left Roll Test: possibly geotropic? Difficult to tell due to excess eye movements  MOTION SENSITIVITY:  Motion Sensitivity Quotient Intensity: 0 = none, 1 =  Lightheaded, 2 = Mild, 3 = Moderate, 4 = Severe, 5 = Vomiting  Intensity  1. Sitting to supine   2. Supine to L side   3. Supine to R side   4. Supine to sitting   5. L Hallpike-Dix   6. Up from L    7. R Hallpike-Dix   8. Up from R    9. Sitting, head tipped to L knee   10. Head up from L knee   11. Sitting, head tipped to R knee   12. Head up from R knee   13. Sitting head turns x5   14.Sitting head nods x5   15. In stance, 180 turn to L    16. In stance, 180 turn to R     OTHOSTATICS: not done  FUNCTIONAL GAIT:  FGA: TBD     GOALS: Goals reviewed with patient? Yes  SHORT TERM GOALS: Target date: same as LTG    LONG TERM GOALS: Target date: 11/16/2023    The patient will be independent with HEP for gaze adaptation, habituation, balance, and general mobility. Baseline:  Goal status: IN PROGRESS  2.  Pt to be free of positional dizziness to reduce risk for falls and improve quality of life Baseline:  Goal status: IN PROGRESS  3.  Pt to manifest improved symptoms per expected outcomes FOTO survey of 59 Baseline: 51 Goal status: IN PROGRESS  4.  Demo improved balance and low risk for falls per score 25/28 Functional Gait Assessment Baseline: 20/30 11/06/2023 Goal status: IN PROGRESS    ASSESSMENT:  CLINICAL IMPRESSION: Pt presents today with reports of no dizziness with rolling since last visit.  She has been using vibration at R ear and thinks this might have been part of improvement.  With positional testing today, no nystagmus noted with R or L roll and no symptoms.  Pt does appear to have some nystagmus (more  horizontal in nature) with L Gilberto Better position and reports "blurriness", not spinning.  Treated with L Epley x 1, with pt tolerating well.  Given pt's significantly decreased symptoms, transitioned to assess pt's balance.  She has increased sway on Condition 4 on MCTSIB, indicating decreased vestibular system use for balance and she has more difficulty with head turns and with EC gait on FGA.  FGA score of 20/30 indicates increased fall risk.  Pt will continue to benefit from skilled PT towards goals for improved functional mobility and decreased dizziness.   OBJECTIVE IMPAIRMENTS: decreased activity tolerance, decreased balance, difficulty walking, and dizziness.   ACTIVITY LIMITATIONS: bending, stairs, transfers, bed mobility, and locomotion level  PARTICIPATION LIMITATIONS: meal prep, cleaning, laundry, and community activity  PERSONAL FACTORS: Age, Time since onset of injury/illness/exacerbation, and 1 comorbidity: PMH  are also affecting patient's functional outcome.   REHAB POTENTIAL: Excellent  CLINICAL DECISION MAKING: Stable/uncomplicated  EVALUATION COMPLEXITY: Low   PLAN:  PT FREQUENCY: 2x/week  PT DURATION: 4 weeks  PLANNED INTERVENTIONS: 97110-Therapeutic exercises, 97530- Therapeutic activity, 97112- Neuromuscular re-education, 97535- Self Care, 16109- Manual therapy, [phone]- Gait training, [phone]- Canalith repositioning, Patient/Family education, Balance training, Stair training, Joint mobilization, Spinal mobilization, Vestibular training, and DME instructions  PLAN FOR NEXT SESSION: Recheck for positional vertigo and treat as appropriate.  Corner balance exercise to address vestibular system use for balance.  Lonia Blood, PT 11/06/23 5:18 PM Phone: [phone] Fax: [phone]  [facility] at [facility] [address] [place], [address] [place], [place] [address] Phone # [phone] Fax # [phone]  

## 2023-11-09 ENCOUNTER — Ambulatory Visit: Payer: Medicare Other | Attending: Internal Medicine | Admitting: Physical Therapy

## 2023-11-09 DIAGNOSIS — R42 Dizziness and giddiness: Secondary | ICD-10-CM | POA: Insufficient documentation

## 2023-11-09 DIAGNOSIS — R2681 Unsteadiness on feet: Secondary | ICD-10-CM | POA: Insufficient documentation

## 2023-11-10 ENCOUNTER — Ambulatory Visit: Payer: Medicare Other

## 2023-11-10 DIAGNOSIS — R2681 Unsteadiness on feet: Secondary | ICD-10-CM

## 2023-11-10 DIAGNOSIS — M25562 Pain in left knee: Secondary | ICD-10-CM | POA: Insufficient documentation

## 2023-11-10 DIAGNOSIS — R42 Dizziness and giddiness: Secondary | ICD-10-CM | POA: Diagnosis not present

## 2023-11-10 NOTE — Therapy (Signed)
 OUTPATIENT PHYSICAL THERAPY VESTIBULAR TREATMENT     Patient Name: [name] MRN: [id number] DOB:06/07/1951, 73 y.o., female Today's Date: 11/10/2023  END OF SESSION:  PT End of Session - 11/10/23 1019     Visit Number 7    Number of Visits 8    Date for PT Re-Evaluation 11/16/23    Authorization Type Medicare    PT Start Time 1015    PT Stop Time 1100    PT Time Calculation (min) 45 min    Activity Tolerance Patient tolerated treatment well    Behavior During Therapy [facility] for tasks assessed/performed                 Past Medical History:  Diagnosis Date   Abnormal Pap smear of vagina 06/17/15   LGSIL   Allergic rhinitis, cause unspecified    Arthritis    ARTHRITIS, KNEE 02/10/2009   Qualifier: Diagnosis of  By: [name]    BRCA negative 03/25/13   Cervical cancer (HCC) 1984   Cervical disc disease 02/11/2012   Chronic lower back pain    Degenerative arthritis of hip 02/11/2012   Diverticulosis 02/11/2012   GERD 08/10/2007   Qualifier: Diagnosis of  By: [name]     GERD (gastroesophageal reflux disease)    GERD (gastroesophageal reflux disease)    Hemorrhoids    HEMORRHOIDS-INTERNAL 07/21/2010   Qualifier: Diagnosis of  By: [name]     Hiatal hernia    Hyperlipidemia 02/13/2012   Mitral valve prolapse    no cardiologist, mild   OAB (overactive bladder)    Obesity    OVERACTIVE BLADDER 08/10/2007   Qualifier: Diagnosis of  By: [name]     Pneumonia 2017   PONV (postoperative nausea and vomiting)    yrs ago unable to raise arms after female surgery, no recent problems   Recurrent UTI    Sleep apnea    uses cpap set on 2   SUI (stress urinary incontinence, female)    Urinary incontinence    VAGINITIS, ATROPHIC 11/11/2008   Qualifier: Diagnosis of  By: [name]    Past Surgical History:  Procedure Laterality Date   bladder tack     x 3 or 4   BUNIONECTOMY Right 2007   CERVICAL DISCECTOMY  2002    fusion and plate C 3   CHOLECYSTECTOMY N/A 08/13/2013   Procedure: LAPAROSCOPIC CHOLECYSTECTOMY WITH INTRAOPERATIVE CHOLANGIOGRAM;  Surgeon: [name];  Location: [facility] OR;  Service: General;  Laterality: N/A;   CLOSED REDUCTION METATARSAL FRACTURE     rt side 06-03-18, left side 08-19-17   COLONOSCOPY  2018   conization of cervix  1982   with D&C   CYSTOCELE REPAIR  2003   rectocele repair   EYE SURGERY Bilateral yrs ago   lasik   ORIF ANKLE FRACTURE Right 08/20/2017   Procedure: OPEN REDUCTION INTERNAL FIXATION (ORIF) ANKLE FRACTURE;  Surgeon: [name];  Location: [facility];  Service: Orthopedics;  Laterality: Right;   SHOULDER ARTHROSCOPY Right 2004   frozen shoulder   TONSILLECTOMY  as child   adenoids removed   TRANSANAL HEMORRHOIDAL DEARTERIALIZATION N/A 07/30/2020   Procedure: TRANSANAL HEMORRHOIDAL DEARTERIALIZATION;  Surgeon: [name];  Location: [facility];  Service: General;  Laterality: N/A;   VAGINAL HYSTERECTOMY  1986   partial   Patient Active Problem List   Diagnosis Date Noted   Asthmatic  bronchitis, mild intermittent, uncomplicated 01/04/2023   Acute sinusitis 01/04/2023   Left lumbar radiculopathy 10/25/2021   Trochanteric bursitis of right hip 08/09/2021   Vitamin D  deficiency 08/09/2020   Paresthesia of foot, bilateral 08/09/2020   Neuritis of right ulnar nerve 08/09/2020   Aortic atherosclerosis (HCC) 08/06/2020   Left cervical radiculopathy 10/07/2019   Bilateral low back pain with bilateral sciatica 10/07/2019   Acute conjunctivitis of left eye 01/17/2019   Urinary frequency 01/09/2019   Dysuria 03/28/2018   Trimalleolar fracture 08/20/2017   Ankle fracture 08/20/2017   Mild obstructive sleep apnea 07/30/2017   Right hip pain 07/28/2017   Abnormal TSH 07/21/2015   Greater trochanteric bursitis of right hip 08/04/2014   Localized osteoarthrosis, lower leg 08/04/2014   Biliary colic 08/07/2013    Cholelithiasis 08/07/2013   Hyperlipidemia 02/13/2012   Impaired glucose tolerance 02/13/2012   Diverticulosis 02/11/2012   Degenerative arthritis of hip 02/11/2012   Cervical disc disease 02/11/2012   Allergic rhinitis    Chronic low back pain    Obesity    Blood in stool 08/31/2010   HEMORRHOIDS-INTERNAL 07/21/2010   CONSTIPATION 07/19/2010   ARTHRITIS, KNEE 02/10/2009   VAGINITIS, ATROPHIC 11/11/2008   EXOGENOUS OBESITY 07/29/2008   GERD 08/10/2007   OVERACTIVE BLADDER 08/10/2007    PCP: [name] REFERRING PROVIDER: [name], Mcdonald  REFERRING DIAG: R42 (ICD-10-CM) - Vertigo  THERAPY DIAG:  Dizziness and giddiness  Unsteadiness on feet  ONSET DATE: 2 months  Rationale for Evaluation and Treatment: Rehabilitation  SUBJECTIVE:   SUBJECTIVE STATEMENT: Only one episode of dizzy/blurry when standing still, lasted about 2 sec. In/out of bed is less problematic, noting ongoing balance issues Pt accompanied by: self  PERTINENT HISTORY:   PAIN:  Are you having pain? No  PRECAUTIONS: None  RED FLAGS: None   WEIGHT BEARING RESTRICTIONS: No  FALLS: Has patient fallen in last 6 months? No  LIVING ENVIRONMENT: Lives with: lives with their spouse Lives in: House/apartment Stairs: Yes: Internal: flight steps; bilateral but cannot reach both Has following equipment at home: None  PLOF: Independent  PATIENT GOALS: eliminate symptoms  OBJECTIVE:    TODAY'S TREATMENT: 11/09/2023 Activity Comments  Right Roll test 4-6 beats apogeotropic nystagmus  Left Roll Test Ongoing apogeotropic nystagmus  Dix-Hallpike Horizontal, apogeo  Kim Maneuver    Roll test:left Horizontal apogeo  Kim maneuver -ongoing apogeo nystagmus  Corner balance Added for HEP    TODAY'S TREATMENT: 11/06/2023 Activity Comments  Right roll Negative   Left roll Negative  L Dix Hallpike Negative, slight dizziness upon return to sit  Mcdonald [employer] Negative, no dizziness  L Dix  Hallpike Horizontal nystagmus, some c/o blurriness no dizziness or spinning  L Epley Good tolerance, does report blurring in position 3.  No dizziness upon sitting EOM  FGA 20/30 See below       M-CTSIB  Condition 1: Firm Surface, EO 30 Sec, Normal Sway  Condition 2: Firm Surface, EC 30 Sec, Mild Sway  Condition 3: Foam Surface, EO 30 Sec, Mild Sway  Condition 4: Foam Surface, EC 30 Sec, Moderate Sway         PATIENT EDUCATION: Education details: Progress with BPPV testing, no apparent horizontal canal BPPV today; reviewed roll habituation; discussed balance measures and fall risk/plans to address with vestibular system training for balance Person educated: Patient Education method: Explanation, Verbal cues, and Handouts Education comprehension: verbalized understanding  Access Code: [email or web address] URL: [email or web address] Date: 10/30/2023 Prepared by: [facility] -  Outpatient  Rehab - [facility]  Exercises - Supine to Left Sidelying Vestibular Habituation  - 1-2 x daily - 7 x weekly - 1 sets - 3 reps - 30 sec hold - Corner Balance Feet Together With Eyes Open  - 1 x daily - 7 x weekly - 3 sets - 30 sec hold - Corner Balance Feet Together With Eyes Closed  - 1 x daily - 7 x weekly - 3 sets - 30 sec hold - Corner Balance Feet Together: Eyes Open With Head Turns  - 1 x daily - 7 x weekly - 3 sets - 2 reps - Corner Balance Feet Together: Eyes Closed With Head Turns  - 1 x daily - 7 x weekly - 3 sets - 2 reps - Tandem Stance in Corner  - 1 x daily - 7 x weekly - 3 sets - 15-30 sec hold ------------------------------------------------------------------------- Note: Objective measures were completed at Evaluation unless otherwise noted.  DIAGNOSTIC FINDINGS: n/a for episode  COGNITION: Overall cognitive status: WNL   SENSATION: NT  EDEMA:  none  MUSCLE TONE:  NT  DTRs:  NT  POSTURE:  No Significant postural limitations  Cervical ROM:     WFL  STRENGTH: NT     PATIENT SURVEYS:  FOTO 51  VESTIBULAR ASSESSMENT:  GENERAL OBSERVATION: wears trifocals   SYMPTOM BEHAVIOR:  Subjective history:   Non-Vestibular symptoms: tinnitus  Type of dizziness: Spinning/Vertigo  Frequency: daily  Duration: seconds-minutes, lingering balance issues thereafter  Aggravating factors: Induced by position change: lying supine, rolling to the right, rolling to the left, and supine to sit and Worse in the morning  Relieving factors: slow movements  Progression of symptoms: unchanged  OCULOMOTOR EXAM:  Ocular Alignment: normal  Ocular ROM: No Limitations  Spontaneous Nystagmus: absent  Gaze-Induced Nystagmus: absent  Smooth Pursuits: intact  Saccades: intact  Convergence/Divergence: 3 cm     VESTIBULAR - OCULAR REFLEX:   Slow VOR: Normal, no symptoms  VOR Cancellation: Normal  Head-Impulse Test: HIT Right: negative HIT Left: negative  Dynamic Visual Acuity: Not able to be assessed   POSITIONAL TESTING: Right Dix-Hallpike: upbeating, right nystagmus Left Dix-Hallpike: no nystagmus Right Roll Test: no nystagmus Left Roll Test: possibly geotropic? Difficult to tell due to excess eye movements  MOTION SENSITIVITY:  Motion Sensitivity Quotient Intensity: 0 = none, 1 = Lightheaded, 2 = Mild, 3 = Moderate, 4 = Severe, 5 = Vomiting  Intensity  1. Sitting to supine   2. Supine to L side   3. Supine to Mcdonald side   4. Supine to sitting   5. L Hallpike-Dix   6. Up from L    7. Mcdonald Hallpike-Dix   8. Up from Mcdonald    9. Sitting, head tipped to L knee   10. Head up from L knee   11. Sitting, head tipped to Mcdonald knee   12. Head up from Mcdonald knee   13. Sitting head turns x5   14.Sitting head nods x5   15. In stance, 180 turn to L    16. In stance, 180 turn to Mcdonald     OTHOSTATICS: not done  FUNCTIONAL GAIT:  FGA: TBD     GOALS: Goals reviewed with patient? Yes  SHORT TERM GOALS: Target date: same as LTG    LONG TERM GOALS:  Target date: 11/16/2023    The patient will be independent with HEP for gaze adaptation, habituation, balance, and general mobility. Baseline:  Goal status: IN PROGRESS  2.  Pt to be free of positional dizziness to reduce risk for falls and improve quality of life Baseline:  Goal status: IN PROGRESS  3.  Pt to manifest improved symptoms per expected outcomes FOTO survey of 59 Baseline: 51 Goal status: IN PROGRESS  4.  Demo improved balance and low risk for falls per score 25/28 Functional Gait Assessment Baseline: 20/30 11/06/2023 Goal status: IN PROGRESS    ASSESSMENT:  CLINICAL IMPRESSION: Pt notes subjective improvement in dizziness symptoms with less frequency and intensity. Positional tests reveal provocative positions in roll test demonstrating apogeotropic horizontal nystagmus left > right Roll test. Performed two rounds of right [facility], unfortunately without much impact as apogeotropic nystagmus persists.  Review of static balance activities performed in corner for safety and imposing multi-sensory demands with greatest challenge being eyes closed with head movements.  Continued sessions to progress POC details and hopefully eliminate positional dizziness and improve balance.    OBJECTIVE IMPAIRMENTS: decreased activity tolerance, decreased balance, difficulty walking, and dizziness.   ACTIVITY LIMITATIONS: bending, stairs, transfers, bed mobility, and locomotion level  PARTICIPATION LIMITATIONS: meal prep, cleaning, laundry, and community activity  PERSONAL FACTORS: Age, Time since onset of injury/illness/exacerbation, and 1 comorbidity: PMH  are also affecting patient's functional outcome.   REHAB POTENTIAL: Excellent  CLINICAL DECISION MAKING: Stable/uncomplicated  EVALUATION COMPLEXITY: Low   PLAN:  PT FREQUENCY: 2x/week  PT DURATION: 4 weeks  PLANNED INTERVENTIONS: 97110-Therapeutic exercises, 97530- Therapeutic activity, 97112- Neuromuscular  re-education, 97535- Self Care, [id number]- Manual therapy, [phone]- Gait training, [phone]- Canalith repositioning, Patient/Family education, Balance training, Stair training, Joint mobilization, Spinal mobilization, Vestibular training, and DME instructions  PLAN FOR NEXT SESSION: Recheck for positional vertigo and treat as appropriate.  Corner balance exercise to address vestibular system use for balance. Recertification?  11:02 AM, 11/10/23 M. Kelly Mardene Lessig, PT, DPT Physical Therapist- [place] Office Number: [phone]  Phone # [phone] Fax # [phone]

## 2023-11-10 NOTE — Therapy (Signed)
 OUTPATIENT PHYSICAL THERAPY VESTIBULAR PROGRESS NOTE/RECERT     Patient Name: [name] MRN: [id number] DOB:29-Aug-1951, 73 y.o., female Today's Date: 11/13/2023   Progress Note Reporting Period 10/19/23 to 11/13/23  See note below for Objective Data and Assessment of Progress/Goals.      END OF SESSION:  PT End of Session - 11/13/23 1149     Visit Number 8    Number of Visits 12    Date for PT Re-Evaluation 12/11/23    Authorization Type Medicare    PT Start Time 1058    PT Stop Time 1144    PT Time Calculation (min) 46 min    Equipment Utilized During Treatment Gait belt    Activity Tolerance Patient tolerated treatment well    Behavior During Therapy WFL for tasks assessed/performed                 Past Medical History:  Diagnosis Date   Abnormal Pap smear of vagina 06/17/15   LGSIL   Allergic rhinitis, cause unspecified    Arthritis    ARTHRITIS, KNEE 02/10/2009   Qualifier: Diagnosis of  By: Viviann Raddle MD, Willie R    BRCA negative 03/25/13   Cervical cancer (HCC) 1984   Cervical disc disease 02/11/2012   Chronic lower back pain    Degenerative arthritis of hip 02/11/2012   Diverticulosis 02/11/2012   GERD 08/10/2007   Qualifier: Diagnosis of  By: Delos, CMA, Cindy     GERD (gastroesophageal reflux disease)    GERD (gastroesophageal reflux disease)    Hemorrhoids    HEMORRHOIDS-INTERNAL 07/21/2010   Qualifier: Diagnosis of  By: Kerman NP, Paula     Hiatal hernia    Hyperlipidemia 02/13/2012   Mitral valve prolapse    no cardiologist, mild   OAB (overactive bladder)    Obesity    OVERACTIVE BLADDER 08/10/2007   Qualifier: Diagnosis of  By: Delos, CMA, Cindy     Pneumonia 2017   PONV (postoperative nausea and vomiting)    yrs ago unable to raise arms after female surgery, no recent problems   Recurrent UTI    Sleep apnea    uses cpap set on 2   SUI (stress urinary incontinence, female)    Urinary incontinence    VAGINITIS, ATROPHIC 11/11/2008    Qualifier: Diagnosis of  By: Viviann Raddle MD, Marsha SAUNDERS    Past Surgical History:  Procedure Laterality Date   bladder tack     x 3 or 4   BUNIONECTOMY Right 2007   CERVICAL DISCECTOMY  2002   fusion and plate C 3   CHOLECYSTECTOMY N/A 08/13/2013   Procedure: LAPAROSCOPIC CHOLECYSTECTOMY WITH INTRAOPERATIVE CHOLANGIOGRAM;  Surgeon: Alm VEAR Angle, MD;  Location: [facility];  Service: General;  Laterality: N/A;   CLOSED REDUCTION METATARSAL FRACTURE     rt side 06-03-18, left side 08-19-17   COLONOSCOPY  2018   conization of cervix  1982   with D&C   CYSTOCELE REPAIR  2003   rectocele repair   EYE SURGERY Bilateral yrs ago   lasik   ORIF ANKLE FRACTURE Right 08/20/2017   Procedure: OPEN REDUCTION INTERNAL FIXATION (ORIF) ANKLE FRACTURE;  Surgeon: Addie Cordella Hamilton, MD;  Location: [facility];  Service: Orthopedics;  Laterality: Right;   SHOULDER ARTHROSCOPY Right 2004   frozen shoulder   TONSILLECTOMY  as child   adenoids removed   TRANSANAL HEMORRHOIDAL DEARTERIALIZATION N/A 07/30/2020   Procedure: TRANSANAL HEMORRHOIDAL DEARTERIALIZATION;  Surgeon: Debby Hila, MD;  Location: [facility];  Service: General;  Laterality: N/A;   VAGINAL HYSTERECTOMY  1986   partial   Patient Active Problem List   Diagnosis Date Noted   Asthmatic bronchitis, mild intermittent, uncomplicated 01/04/2023   Acute sinusitis 01/04/2023   Left lumbar radiculopathy 10/25/2021   Trochanteric bursitis of right hip 08/09/2021   Vitamin D  deficiency 08/09/2020   Paresthesia of foot, bilateral 08/09/2020   Neuritis of right ulnar nerve 08/09/2020   Aortic atherosclerosis (HCC) 08/06/2020   Left cervical radiculopathy 10/07/2019   Bilateral low back pain with bilateral sciatica 10/07/2019   Acute conjunctivitis of left eye 01/17/2019   Urinary frequency 01/09/2019   Dysuria 03/28/2018   Trimalleolar fracture 08/20/2017   Ankle fracture 08/20/2017   Mild obstructive sleep apnea 07/30/2017   Right  hip pain 07/28/2017   Abnormal TSH 07/21/2015   Greater trochanteric bursitis of right hip 08/04/2014   Localized osteoarthrosis, lower leg 08/04/2014   Biliary colic 08/07/2013   Cholelithiasis 08/07/2013   Hyperlipidemia 02/13/2012   Impaired glucose tolerance 02/13/2012   Diverticulosis 02/11/2012   Degenerative arthritis of hip 02/11/2012   Cervical disc disease 02/11/2012   Allergic rhinitis    Chronic low back pain    Obesity    Blood in stool 08/31/2010   HEMORRHOIDS-INTERNAL 07/21/2010   CONSTIPATION 07/19/2010   ARTHRITIS, KNEE 02/10/2009   VAGINITIS, ATROPHIC 11/11/2008   EXOGENOUS OBESITY 07/29/2008   GERD 08/10/2007   OVERACTIVE BLADDER 08/10/2007    PCP: [name], MD REFERRING PROVIDER: [name], MD  REFERRING DIAG: R42 (ICD-10-CM) - Vertigo  THERAPY DIAG:  Dizziness and giddiness  Unsteadiness on feet  ONSET DATE: 2 months  Rationale for Evaluation and Treatment: Rehabilitation  SUBJECTIVE:   SUBJECTIVE STATEMENT: Maybe a tad bit better. Not had any dizzy spells. Has not been avoiding any activities. Reports no remaining dizziness with L rolling exercise.   Pt accompanied by: self  PERTINENT HISTORY:   PAIN:  Are you having pain?  Other than my knees, no. Reports an appointment today to get knee pain checked out after a fall 1 month ago.  PRECAUTIONS: None  RED FLAGS: None   WEIGHT BEARING RESTRICTIONS: No  FALLS: Has patient fallen in last 6 months? No  LIVING ENVIRONMENT: Lives with: lives with their spouse Lives in: House/apartment Stairs: Yes: Internal: flight steps; bilateral but cannot reach both Has following equipment at home: None  PLOF: Independent  PATIENT GOALS: eliminate symptoms  OBJECTIVE:    TODAY'S TREATMENT: 11/13/2023 Activity Comments  FOTO [phone]  FGA 21/28  R roll Saccadic intrusions that appear horizontal in nature; reports slight dizziness upon initial roll  L roll  Saccadic intrusions that  appear horizontal in nature  L DH Saccadic intrusions, negative  R DH Saccadic intrusions, negative; c/o double vision when looking to severe R side with eyes which went away when looking in midline   at counter top: walking EC 4x  Fwd/back stepping with and   Cues to widen BOS   R/L rolling EO and EC Quickly; c/o mild brief dizziness upon rolling to midline from L side only on 1st rep       [facility] PT Assessment - 11/13/23 0001       Functional Gait  Assessment   Gait Level Surface Walks 20 ft, slow speed, abnormal gait pattern, evidence for imbalance or deviates 10-15 in outside of the 12 in walkway width. Requires more than 7 sec to ambulate 20 ft.  Change in Gait Speed Able to change speed, demonstrates mild gait deviations, deviates 6-10 in outside of the 12 in walkway width, or no gait deviations, unable to achieve a major change in velocity, or uses a change in velocity, or uses an assistive device.    Gait with Horizontal Head Turns Performs head turns smoothly with slight change in gait velocity (eg, minor disruption to smooth gait path), deviates 6-10 in outside 12 in walkway width, or uses an assistive device.    Gait with Vertical Head Turns Performs task with slight change in gait velocity (eg, minor disruption to smooth gait path), deviates 6 - 10 in outside 12 in walkway width or uses assistive device    Gait and Pivot Turn Pivot turns safely within 3 sec and stops quickly with no loss of balance.    Step Over Obstacle Is able to step over one shoe box (4.5 in total height) but must slow down and adjust steps to clear box safely. May require verbal cueing.    Gait with Narrow Base of Support Is able to ambulate for 10 steps heel to toe with no staggering.    Gait with Eyes Closed Walks 20 ft, slow speed, abnormal gait pattern, evidence for imbalance, deviates 10-15 in outside 12 in walkway width. Requires more than 9 sec to ambulate 20 ft.    Ambulating Backwards Walks 20 ft, no  assistive devices, good speed, no evidence for imbalance, normal gait    Steps Alternating feet, no rail.    Total Score 21             HOME EXERCISE PROGRAM Last updated: 11/13/23 Access Code: [email or web address] URL: [email or web address] Date: 11/13/2023 Prepared by: [facility] - Outpatient  Rehab - [facility]  Exercises - Corner Balance Feet Together With Eyes Open  - 1 x daily - 7 x weekly - 3 sets - 30 sec hold - Corner Balance Feet Together With Eyes Closed  - 1 x daily - 7 x weekly - 3 sets - 30 sec hold - Corner Balance Feet Together: Eyes Open With Head Turns  - 1 x daily - 7 x weekly - 3 sets - 2 reps - Corner Balance Feet Together: Eyes Closed With Head Turns  - 1 x daily - 7 x weekly - 3 sets - 2 reps - Tandem Stance in Corner  - 1 x daily - 7 x weekly - 3 sets - 15-30 sec hold - Walking with Eyes Closed and Counter Support  - 1 x daily - 5 x weekly - 2 sets - 5 reps - Alternating Step Forward with Support  - 1 x daily - 5 x weekly - 2 sets - 10 reps   PATIENT EDUCATION: Education details: edu on objective progress and remaining impairments; POC, video of saccadic instructions rather than nystagmus, HEP update  Person educated: Patient Education method: Explanation, Demonstration, Tactile cues, Verbal cues, and Handouts Education comprehension: verbalized understanding and returned demonstration    Access Code: [email or web address] URL: [email or web address] Date: 10/30/2023 Prepared by: [facility] - Outpatient  Rehab - [facility]  Exercises - Supine to Left Sidelying Vestibular Habituation  - 1-2 x daily - 7 x weekly - 1 sets - 3 reps - 30 sec hold  ------------------------------------------------------------------------- Note: Objective measures were completed at Evaluation unless otherwise noted.  DIAGNOSTIC FINDINGS: n/a for episode  COGNITION: Overall cognitive status: WNL   SENSATION: NT  EDEMA:  none  MUSCLE TONE:  NT  DTRs:  NT  POSTURE:  No Significant postural limitations  Cervical ROM:    WFL  STRENGTH: NT     PATIENT SURVEYS:  FOTO 51  VESTIBULAR ASSESSMENT:  GENERAL OBSERVATION: wears trifocals   SYMPTOM BEHAVIOR:  Subjective history:   Non-Vestibular symptoms: tinnitus  Type of dizziness: Spinning/Vertigo  Frequency: daily  Duration: seconds-minutes, lingering balance issues thereafter  Aggravating factors: Induced by position change: lying supine, rolling to the right, rolling to the left, and supine to sit and Worse in the morning  Relieving factors: slow movements  Progression of symptoms: unchanged  OCULOMOTOR EXAM:  Ocular Alignment: normal  Ocular ROM: No Limitations  Spontaneous Nystagmus: absent  Gaze-Induced Nystagmus: absent  Smooth Pursuits: intact  Saccades: intact  Convergence/Divergence: 3 cm     VESTIBULAR - OCULAR REFLEX:   Slow VOR: Normal, no symptoms  VOR Cancellation: Normal  Head-Impulse Test: HIT Right: negative HIT Left: negative  Dynamic Visual Acuity: Not able to be assessed   POSITIONAL TESTING: Right Dix-Hallpike: upbeating, right nystagmus Left Dix-Hallpike: no nystagmus Right Roll Test: no nystagmus Left Roll Test: possibly geotropic? Difficult to tell due to excess eye movements  MOTION SENSITIVITY:  Motion Sensitivity Quotient Intensity: 0 = none, 1 = Lightheaded, 2 = Mild, 3 = Moderate, 4 = Severe, 5 = Vomiting  Intensity  1. Sitting to supine   2. Supine to L side   3. Supine to R side   4. Supine to sitting   5. L Hallpike-Dix   6. Up from L    7. R Hallpike-Dix   8. Up from R    9. Sitting, head tipped to L knee   10. Head up from L knee   11. Sitting, head tipped to R knee   12. Head up from R knee   13. Sitting head turns x5   14.Sitting head nods x5   15. In stance, 180 turn to L    16. In stance, 180 turn to R     OTHOSTATICS: not done  FUNCTIONAL GAIT:  FGA: TBD     GOALS: Goals reviewed with patient?  Yes  SHORT TERM GOALS: Target date: same as LTG    LONG TERM GOALS: Target date: 12/22/2023    The patient will be independent with HEP for gaze adaptation, habituation, balance, and general mobility. Baseline: met for current 11/13/23 Goal status: IN PROGRESS 11/13/23  2.  Pt to be free of positional dizziness to reduce risk for falls and improve quality of life Baseline: no dizziness 11/13/23 Goal status: MET 11/13/23  3.  Pt to manifest improved symptoms per expected outcomes FOTO survey of 59 Baseline: 51; 55.7 11/13/23 Goal status: IN PROGRESS 11/13/23  4.  Demo improved balance and low risk for falls per score 25/28 Functional Gait Assessment Baseline: 20/30 11/06/2023; 21/28 11/13/23 Goal status: IN PROGRESS 11/13/23    ASSESSMENT:  CLINICAL IMPRESSION: Patient arrived to session with report of no dizziness since last session. FOTO score has improved. Patient also reports no symptoms with rolling habituation HEP. Patient scored 21/28 on FGA, indicating an increased risk of falls. Most difficulty evident with walking with EC, slow speed gait, gait with head movements. Positional testing was grossly negative today as patient demonstrates saccadic intrusions rather than nystagmus and is asymptomatic. HEP was updated with focus on dynamic balance. Patient reported understanding. Would benefit from additional skilled PT services 1x/week for 4 weeks to address falls risk.   OBJECTIVE IMPAIRMENTS: decreased activity tolerance, decreased balance, difficulty walking,  and dizziness.   ACTIVITY LIMITATIONS: bending, stairs, transfers, bed mobility, and locomotion level  PARTICIPATION LIMITATIONS: meal prep, cleaning, laundry, and community activity  PERSONAL FACTORS: Age, Time since onset of injury/illness/exacerbation, and 1 comorbidity: PMH  are also affecting patient's functional outcome.   REHAB POTENTIAL: Excellent  CLINICAL DECISION MAKING: Stable/uncomplicated  EVALUATION COMPLEXITY:  Low   PLAN:  PT FREQUENCY: 1x/week  PT DURATION: 4 weeks  PLANNED INTERVENTIONS: 97110-Therapeutic exercises, 97530- Therapeutic activity, 97112- Neuromuscular re-education, 97535- Self Care, 02859- Manual therapy, [phone]- Gait training, [phone]- Canalith repositioning, Patient/Family education, Balance training, Stair training, Joint mobilization, Spinal mobilization, Vestibular training, and DME instructions  PLAN FOR NEXT SESSION: continue with dynamic balance training    Louana Terrilyn Christians, PT, DPT 11/13/23 11:55 AM  [facility] [address] [place], [place] [address] Phone # [phone] Fax # [phone]

## 2023-11-13 ENCOUNTER — Ambulatory Visit: Payer: Medicare Other | Admitting: Physical Therapy

## 2023-11-13 ENCOUNTER — Encounter: Payer: Self-pay | Admitting: Physical Therapy

## 2023-11-13 DIAGNOSIS — R42 Dizziness and giddiness: Secondary | ICD-10-CM

## 2023-11-13 DIAGNOSIS — M25561 Pain in right knee: Secondary | ICD-10-CM | POA: Diagnosis not present

## 2023-11-13 DIAGNOSIS — R2681 Unsteadiness on feet: Secondary | ICD-10-CM

## 2023-11-13 DIAGNOSIS — M25562 Pain in left knee: Secondary | ICD-10-CM | POA: Diagnosis not present

## 2023-11-14 DIAGNOSIS — M25522 Pain in left elbow: Secondary | ICD-10-CM | POA: Insufficient documentation

## 2023-11-14 DIAGNOSIS — M25822 Other specified joint disorders, left elbow: Secondary | ICD-10-CM | POA: Insufficient documentation

## 2023-11-15 ENCOUNTER — Ambulatory Visit: Payer: Medicare Other | Admitting: Internal Medicine

## 2023-11-15 ENCOUNTER — Ambulatory Visit: Payer: Medicare Other | Admitting: Physical Therapy

## 2023-11-22 ENCOUNTER — Ambulatory Visit: Payer: Medicare Other | Admitting: Physical Therapy

## 2023-11-29 ENCOUNTER — Ambulatory Visit: Payer: Medicare Other | Admitting: Physical Therapy

## 2023-11-29 DIAGNOSIS — H52223 Regular astigmatism, bilateral: Secondary | ICD-10-CM | POA: Diagnosis not present

## 2023-11-29 DIAGNOSIS — H5203 Hypermetropia, bilateral: Secondary | ICD-10-CM | POA: Diagnosis not present

## 2023-11-29 DIAGNOSIS — H43393 Other vitreous opacities, bilateral: Secondary | ICD-10-CM | POA: Diagnosis not present

## 2023-11-29 DIAGNOSIS — H524 Presbyopia: Secondary | ICD-10-CM | POA: Diagnosis not present

## 2023-11-29 DIAGNOSIS — H53143 Visual discomfort, bilateral: Secondary | ICD-10-CM | POA: Diagnosis not present

## 2023-11-29 DIAGNOSIS — H2513 Age-related nuclear cataract, bilateral: Secondary | ICD-10-CM | POA: Diagnosis not present

## 2023-12-06 ENCOUNTER — Other Ambulatory Visit: Payer: Self-pay | Admitting: Internal Medicine

## 2023-12-07 ENCOUNTER — Other Ambulatory Visit: Payer: Self-pay

## 2023-12-13 ENCOUNTER — Encounter: Payer: Medicare Other | Admitting: Physical Therapy

## 2023-12-20 ENCOUNTER — Encounter: Payer: Medicare Other | Admitting: Physical Therapy

## 2024-01-30 ENCOUNTER — Other Ambulatory Visit: Payer: Self-pay | Admitting: Internal Medicine

## 2024-02-05 ENCOUNTER — Telehealth: Payer: Self-pay | Admitting: Internal Medicine

## 2024-02-05 MED ORDER — EZETIMIBE 10 MG PO TABS: 10.0000 mg | ORAL_TABLET | Freq: Every day | ORAL | 2 refills | Status: DC

## 2024-02-05 NOTE — Telephone Encounter (Signed)
 Pt's medication was sent to pt's pharmacy as requested. Confirmation received.

## 2024-02-05 NOTE — Telephone Encounter (Signed)
*  STAT* If patient is at the pharmacy, call can be transferred to refill team.   1. Which medications need to be refilled? (please list name of each medication and dose if known) Ezetimibe   2. Would you like to learn more about the convenience, safety, & potential cost savings by using the [facility]?    3. Are you open to using the [facility] (Type [facility].   4. Which pharmacy/location (including street and city if local pharmacy) is medication to be sent to?[facility] [address], [place]   5. Do they need a 30 day or 90 day supply? 90 days and refills

## 2024-02-13 ENCOUNTER — Ambulatory Visit: Payer: Medicare Other | Admitting: Internal Medicine

## 2024-03-01 DIAGNOSIS — H2512 Age-related nuclear cataract, left eye: Secondary | ICD-10-CM | POA: Diagnosis not present

## 2024-03-01 DIAGNOSIS — H25013 Cortical age-related cataract, bilateral: Secondary | ICD-10-CM | POA: Diagnosis not present

## 2024-03-01 DIAGNOSIS — H18413 Arcus senilis, bilateral: Secondary | ICD-10-CM | POA: Diagnosis not present

## 2024-03-01 DIAGNOSIS — H25043 Posterior subcapsular polar age-related cataract, bilateral: Secondary | ICD-10-CM | POA: Diagnosis not present

## 2024-03-01 DIAGNOSIS — H2513 Age-related nuclear cataract, bilateral: Secondary | ICD-10-CM | POA: Diagnosis not present

## 2024-03-05 DIAGNOSIS — M47812 Spondylosis without myelopathy or radiculopathy, cervical region: Secondary | ICD-10-CM | POA: Diagnosis not present

## 2024-03-05 DIAGNOSIS — G5602 Carpal tunnel syndrome, left upper limb: Secondary | ICD-10-CM | POA: Diagnosis not present

## 2024-03-06 ENCOUNTER — Encounter: Payer: Self-pay | Admitting: Neurology

## 2024-03-08 ENCOUNTER — Other Ambulatory Visit: Payer: Self-pay | Admitting: Internal Medicine

## 2024-03-13 DIAGNOSIS — H2512 Age-related nuclear cataract, left eye: Secondary | ICD-10-CM | POA: Diagnosis not present

## 2024-03-14 DIAGNOSIS — H2511 Age-related nuclear cataract, right eye: Secondary | ICD-10-CM | POA: Diagnosis not present

## 2024-03-15 ENCOUNTER — Other Ambulatory Visit: Payer: Self-pay

## 2024-03-15 ENCOUNTER — Other Ambulatory Visit: Payer: Self-pay | Admitting: Internal Medicine

## 2024-03-15 DIAGNOSIS — M25469 Effusion, unspecified knee: Secondary | ICD-10-CM | POA: Insufficient documentation

## 2024-03-15 DIAGNOSIS — M7041 Prepatellar bursitis, right knee: Secondary | ICD-10-CM | POA: Diagnosis not present

## 2024-03-15 DIAGNOSIS — M25461 Effusion, right knee: Secondary | ICD-10-CM | POA: Diagnosis not present

## 2024-03-20 DIAGNOSIS — H5212 Myopia, left eye: Secondary | ICD-10-CM | POA: Diagnosis not present

## 2024-03-20 DIAGNOSIS — Z961 Presence of intraocular lens: Secondary | ICD-10-CM | POA: Diagnosis not present

## 2024-03-20 DIAGNOSIS — H2512 Age-related nuclear cataract, left eye: Secondary | ICD-10-CM | POA: Diagnosis not present

## 2024-03-20 DIAGNOSIS — Z9842 Cataract extraction status, left eye: Secondary | ICD-10-CM | POA: Diagnosis not present

## 2024-03-21 DIAGNOSIS — M7041 Prepatellar bursitis, right knee: Secondary | ICD-10-CM | POA: Insufficient documentation

## 2024-04-04 ENCOUNTER — Other Ambulatory Visit: Payer: Self-pay

## 2024-04-04 DIAGNOSIS — R202 Paresthesia of skin: Secondary | ICD-10-CM

## 2024-04-10 DIAGNOSIS — H2511 Age-related nuclear cataract, right eye: Secondary | ICD-10-CM | POA: Diagnosis not present

## 2024-04-18 ENCOUNTER — Ambulatory Visit (INDEPENDENT_AMBULATORY_CARE_PROVIDER_SITE_OTHER): Admitting: Neurology

## 2024-04-18 DIAGNOSIS — G629 Polyneuropathy, unspecified: Secondary | ICD-10-CM

## 2024-04-18 DIAGNOSIS — R202 Paresthesia of skin: Secondary | ICD-10-CM | POA: Diagnosis not present

## 2024-04-18 DIAGNOSIS — M5412 Radiculopathy, cervical region: Secondary | ICD-10-CM

## 2024-04-18 NOTE — Procedures (Signed)
 [facility]  [address] [place], [address]  [place], [place] [address] Tel: [phone] Fax: [phone] Test Date:  04/18/2024  Patient: [name] DOB: 1951/08/29 Physician: Reyna Cava, DO  Sex: Female Height: 5' 7 Ref Phys: Agustina Aldrich, MD  ID#: [id number]   Technician:    History: This is a 73 year old female with history of cervical surgery referred for evaluation of bilateral hand paresthesias.  NCV & EMG Findings: Extensive electrodiagnostic testing of the right upper extremity and additional studies of the left shows:  Bilateral median and radial sensory responses show reduced amplitude (R7.2, L8.5, R7.7, L8.7 V).  Bilateral ulnar sensory responses are within normal limits.   Bilateral median and ulnar motor responses are within normal limits. Chronic motor axonal loss changes are isolated to the left pronator teres and biceps muscles, without accompanying active denervation.  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 Chronic C6 radiculopathy affecting the left upper extremity.   ___________________________ Reyna Cava, DO    Nerve Conduction Studies   Stim Site NR Peak (ms) Norm Peak (ms) O-P Amp (V) Norm O-P Amp  Left Median Anti Sensory (2nd Digit)  32 C  Wrist    3.5 <3.8 *8.5 >10  Right Median Anti Sensory (2nd Digit)  32 C  Wrist    3.5 <3.8 *7.2 >10  Left Radial Anti Sensory (Base 1st Digit)  32 C  Wrist    2.6 <2.8 *8.7 >10  Right Radial Anti Sensory (Base 1st Digit)  32 C  Wrist    2.4 <2.8 *7.7 >10  Left Ulnar Anti Sensory (5th Digit)  32 C  Wrist    3.0 <3.2 9.2 >5  Right Ulnar Anti Sensory (5th Digit)  32 C  Wrist    3.0 <3.2 7.1 >5     Stim Site NR Onset (ms) Norm Onset (ms) O-P Amp (mV) Norm O-P Amp Site1 Site2 Delta-0 (ms) Dist (cm) Vel (m/s) Norm Vel (m/s)  Left Median Motor (Abd Poll Brev)  32 C  Wrist    3.3 <4.0 8.6 >5 Elbow Wrist 5.8 29.0 50 >50   Elbow    9.1  8.2         Right Median Motor (Abd Poll Brev)  32 C  Wrist    3.4 <4.0 6.2 >5 Elbow Wrist 6.8 34.0 50 >50  Elbow    10.2  5.9         Left Ulnar Motor (Abd Dig Minimi)  32 C  Wrist    2.3 <3.1 9.4 >7 B Elbow Wrist 4.2 22.0 52 >50  B Elbow    6.5  8.1  A Elbow B Elbow 1.8 10.0 56 >50  A Elbow    8.3  7.1         Right Ulnar Motor (Abd Dig Minimi)  32 C  Wrist    2.3 <3.1 9.9 >7 B Elbow Wrist 4.2 22.0 52 >50  B Elbow    6.5  8.7  A Elbow B Elbow 1.8 10.0 56 >50  A Elbow    8.3  7.6          Electromyography   Side Muscle Ins.Act Fibs Fasc Recrt Amp Dur Poly Activation Comment  Right 1stDorInt Nml Nml Nml Nml Nml Nml Nml Nml N/A  Right Abd Poll Brev Nml Nml Nml Nml Nml Nml Nml Nml N/A  Right PronatorTeres Nml Nml Nml Nml Nml Nml Nml Nml N/A  Right Biceps Nml Nml  Nml Nml Nml Nml Nml Nml N/A  Right Triceps Nml Nml Nml Nml Nml Nml Nml Nml N/A  Right Deltoid Nml Nml Nml Nml Nml Nml Nml Nml N/A  Right Ext Indicis Nml Nml Nml Nml Nml Nml Nml Nml N/A  Left 1stDorInt Nml Nml Nml Nml Nml Nml Nml Nml N/A  Left Abd Poll Brev Nml Nml Nml Nml Nml Nml Nml Nml N/A  Left PronatorTeres Nml Nml Nml *1- *1+ *1+ *1+ Nml N/A  Left Biceps Nml Nml Nml *1- *1+ *1+ *1+ Nml N/A  Left Triceps Nml Nml Nml Nml Nml Nml Nml Nml N/A  Left Deltoid Nml Nml Nml Nml Nml Nml Nml Nml N/A  Left Ext Indicis Nml Nml Nml Nml Nml Nml Nml Nml N/A      Waveforms:

## 2024-04-22 ENCOUNTER — Ambulatory Visit: Attending: Internal Medicine | Admitting: Internal Medicine

## 2024-04-22 ENCOUNTER — Encounter: Payer: Self-pay | Admitting: Internal Medicine

## 2024-04-22 VITALS — BP 128/82 | HR 81 | Resp 16 | Ht 67.0 in | Wt 238.6 lb

## 2024-04-22 DIAGNOSIS — E785 Hyperlipidemia, unspecified: Secondary | ICD-10-CM | POA: Diagnosis not present

## 2024-04-22 DIAGNOSIS — I700 Atherosclerosis of aorta: Secondary | ICD-10-CM | POA: Insufficient documentation

## 2024-04-22 DIAGNOSIS — R030 Elevated blood-pressure reading, without diagnosis of hypertension: Secondary | ICD-10-CM | POA: Insufficient documentation

## 2024-04-22 DIAGNOSIS — R079 Chest pain, unspecified: Secondary | ICD-10-CM | POA: Diagnosis not present

## 2024-04-22 DIAGNOSIS — I2510 Atherosclerotic heart disease of native coronary artery without angina pectoris: Secondary | ICD-10-CM | POA: Diagnosis not present

## 2024-04-22 NOTE — Progress Notes (Signed)
 Cardiology Office Note:  .   Date:  04/22/2024  ID:  [name], DOB [birth year], MRN [id number] PCP: Roslyn Coombe, MD  [facility] Providers Cardiologist:  None    History of Present Illness: [name]   [name] is a 73 y.o. female.  Discussed the use of AI scribe software for clinical note transcription with the patient, who gave verbal consent to proceed.  History of Present Illness [name] is a 73 year old female with mild coronary artery disease who presents for a follow-up visit due to episodes of chest tightness.  She experiences chest tightness approximately once a week for the past two to three weeks. The sensation is described as a 'box' around her chest with a squeezing feeling, lasting a few seconds without associated pain. Occasional left-sided chest pain occurs, differing from previous right-sided discomfort. These episodes occur at rest, without exertion or stress.  She has not monitored her blood pressure during these episodes but recalls a previous slightly elevated reading. Her weight has increased by approximately 9 pounds, with plans to start gym workouts next month. Her cholesterol is well managed with rosuvastatin  10 mg daily and ezetimibe  10 mg daily.  Her sister passed away in November 02, 2024. I care for her niece, niece's fiance as well.     ROS: negative except per HPI above.  Studies Reviewed: .        Results RADIOLOGY CT coronary arteries: Minimal plaque (11/2022)  DIAGNOSTIC EKG: Nonspecific T wave abnormality has replaced inverted T waves (04/22/2024) Echocardiogram: No evidence of mitral valve prolapse, normal systolic function, normal diastolic function Risk Assessment/Calculations:       Physical Exam:   VS:  BP 128/82 (BP Location: Left Arm, Patient Position: Sitting, Cuff Size: Large)   Pulse 81   Resp 16   Ht 5' 7 (1.702 m)   Wt 238 lb 9.6 oz (108.2 kg)   SpO2 94%   BMI 37.37 kg/m    Wt Readings from Last 3 Encounters:   04/22/24 238 lb 9.6 oz (108.2 kg)  10/04/23 229 lb (103.9 kg)  09/25/23 229 lb (103.9 kg)     Physical Exam GENERAL: Alert, cooperative, well developed, no acute distress. HEENT: Normocephalic, normal oropharynx, moist mucous membranes. CHEST: Clear to auscultation bilaterally, no wheezes, rhonchi, or crackles. CARDIOVASCULAR: Normal heart rate and rhythm, S1 and S2 normal without murmurs. ABDOMEN: Soft, non-tender, non-distended, without organomegaly, normal bowel sounds. EXTREMITIES: No cyanosis or edema. NEUROLOGICAL: Cranial nerves grossly intact, moves all extremities without gross motor or sensory deficit.   ASSESSMENT AND PLAN: .    Assessment and Plan Assessment & Plan Mild coronary artery disease Chest discomfort Intermittent chest tightness likely due to microvascular dysfunction related to blood pressure fluctuations. EKG shows nonspecific T wave abnormality. Echocardiogram normal. Minimal coronary plaque previously noted. - Monitor blood pressure during chest discomfort. - Consider amlodipine 2.5 mg if blood pressure elevated during episodes of discomfort. - Educated on amlodipine side effects, including peripheral edema. - Encouraged weight loss and regular exercise.  Possible microvascular dysfunction Suspected microvascular dysfunction contributing to chest tightness. Minimal coronary plaque previously noted. - Monitor blood pressure during chest discomfort. - Consider amlodipine 2.5 mg if blood pressure elevated during episodes. - Educated on amlodipine side effects, including peripheral edema. - Encouraged weight loss and regular exercise.  Elevated blood pressure Previous mild elevation in blood pressure, current readings in 130s/70-80s. Weight gain may contribute to elevation. - Monitor blood pressure at home, especially during  chest discomfort. - Encouraged weight loss and regular exercise.  Well-controlled hyperlipidemia Cholesterol levels  well-controlled on current regimen. Due for repeat lipid panel in November. - Continue rosuvastatin  10 mg daily. - Continue ezetimibe  10 mg daily. - Repeat lipid panel in November.      Grady Lawman, MD, [facility]

## 2024-04-22 NOTE — Patient Instructions (Addendum)
 Medication Instructions:  No Changes *If you need a refill on your cardiac medications before your next appointment, please call your pharmacy*  Lab Work: None  Follow-Up: At [facility], you and your health needs are our priority.  As part of our continuing mission to provide you with exceptional heart care, our providers are all part of one team.  This team includes your primary Cardiologist (physician) and Advanced Practice Providers or APPs (Physician Assistants and Nurse Practitioners) who all work together to provide you with the care you need, when you need it.  Your next appointment:   6 month(s)  Provider:   Gayatri A Acharya, MD    We recommend signing up for the patient portal called MyChart.  Sign up information is provided on this After Visit Summary.  MyChart is used to connect with patients for Virtual Visits (Telemedicine).  Patients are able to view lab/test results, encounter notes, upcoming appointments, etc.  Non-urgent messages can be sent to your provider as well.   To learn more about what you can do with MyChart, go to [email or web address].   Other Instructions Please check your Blood Pressure regularly, especially if you are having any Chest Discomfort. Call us  with any Concerns.    HOW TO TAKE YOUR BLOOD PRESSURE: Rest 10-15 minutes before taking your blood pressure. Don't smoke or drink caffeinated beverages for at least 30 minutes before. Take your blood pressure before (not after) you eat. Take your BP 1-2 hours after any BP meds Ensure your bladder is empty. Sit comfortably with your back supported and both feet on the floor (don't cross your legs). Elevate your arm to heart level on a table or a desk. Use the proper sized cuff. It should fit smoothly and snugly around your bare upper arm. There should be enough room to slip a fingertip under the cuff. The bottom edge of the cuff should be 1 inch above the crease of the elbow. Ideally, take  3 measurements at one sitting and record the average.   Please call us  or send a MyChart message with any Cardiology related questions/concerns.  [phone].  Thank you! No

## 2024-04-24 DIAGNOSIS — G609 Hereditary and idiopathic neuropathy, unspecified: Secondary | ICD-10-CM | POA: Diagnosis not present

## 2024-05-06 ENCOUNTER — Ambulatory Visit (INDEPENDENT_AMBULATORY_CARE_PROVIDER_SITE_OTHER): Admitting: Neurology

## 2024-05-06 ENCOUNTER — Other Ambulatory Visit

## 2024-05-06 ENCOUNTER — Encounter: Payer: Self-pay | Admitting: Neurology

## 2024-05-06 VITALS — BP 129/79 | HR 85 | Ht 67.0 in | Wt 234.0 lb

## 2024-05-06 DIAGNOSIS — G629 Polyneuropathy, unspecified: Secondary | ICD-10-CM | POA: Diagnosis not present

## 2024-05-06 DIAGNOSIS — R202 Paresthesia of skin: Secondary | ICD-10-CM

## 2024-05-06 NOTE — Progress Notes (Signed)
 [facility] Neurology [facility] Clinic Note - Initial Visit   Date: 05/06/2024   [name] MRN: [id number] DOB: 21-Jun-1951   Dear Dr. Louis:  Thank you for your kind referral of [name] for consultation of neuropathy. Although her history is well known to you, please allow us  to reiterate it for the purpose of our medical record. The patient was accompanied to the clinic by self.     [name] is a 73 y.o. right-handed female with GERD, cervical cancer, hyperlipidemia, and GERD presenting for evaluation of neuropathy.   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 No history of diabetes, alcohol  use, or hereditary neuropathy.  I had extensive discussion with the patient regarding the pathogenesis, etiology, management, and natural course of neuropathy. Neuropathy tends to be slowly progressive, especially if a treatable etiology is not identified.  I would like to test for treatable causes of neuropathy. I discussed that in the vast majority of cases, despite checking for reversible causes, we are unable to find the underlying etiology and management is symptomatic.    - Check folate, SPEP with IFE, folate, copper, heavy metal screen   - Continue gabapentin  100mg  in the morning and 200mg  at bedtime  - Patient educated on daily foot inspection, fall prevention, and safety precautions around the home.  Return to clinic in 6 months  ------------------------------------------------------------- History of present illness: She reports having numbness involving both hands which is worse on the left, and progressively intensified over the past several months.  She denies weakness in the hands.    She has neuropathy involving the lower legs and feet for the past several years, which is getting worse.  She has some imbalance and walks unassisted.  No weakness.  She takes gabapentin  100mg  in the morning and 200mg  at bedtime.   She is  retired from working for [employer]  She lives with husband.  Nonsmoker. Occasionally drinks wine.  She had cervical cancer which only requested surgery.  No history of chemotherapy or radiation.    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 Chronic C6 radiculopathy affecting the left upper extremity.  MRI cervical spine wo contrast 05/06/2024: No change noted since the study of January. 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    Lab Results  Component Value Date   HGBA1C 6.0 08/15/2023   Lab Results  Component Value Date   VITAMINB12 1,494 (H) 08/15/2023   Lab Results  Component Value Date   TSH 2.83 08/15/2023    Past Medical History:  Diagnosis Date   Abnormal Pap smear of vagina 06/17/15   LGSIL   Allergic rhinitis, cause unspecified    Arthritis    ARTHRITIS, KNEE 02/10/2009   Qualifier: Diagnosis of  By: Viviann Raddle MD, Willie R    BRCA negative 03/25/13   Cervical cancer (HCC) 1984   Cervical disc disease 02/11/2012   Chronic lower back pain    Degenerative arthritis of hip 02/11/2012   Diverticulosis 02/11/2012   GERD 08/10/2007   Qualifier: Diagnosis of  By: Delos, CMA, Cindy     GERD (gastroesophageal reflux disease)    GERD (gastroesophageal reflux disease)    Hemorrhoids    HEMORRHOIDS-INTERNAL 07/21/2010   Qualifier: Diagnosis of  By: Kerman NP, Paula     Hiatal hernia    Hyperlipidemia 02/13/2012   Mitral valve prolapse    no cardiologist, mild   OAB (overactive bladder)    Obesity    OVERACTIVE BLADDER 08/10/2007   Qualifier: Diagnosis of  By: Delos, CMA, Cindy     Pneumonia 2017   PONV (postoperative nausea and vomiting)    yrs ago unable  to raise arms after female surgery, no recent problems   Recurrent UTI    Sleep apnea    uses cpap set on 2   SUI (stress urinary incontinence, female)    Urinary incontinence    VAGINITIS, ATROPHIC 11/11/2008   Qualifier: Diagnosis of  By: Viviann Raddle MD, Marsha SAUNDERS     Past Surgical History:  Procedure Laterality Date   bladder tack     x 3 or 4   BUNIONECTOMY Right 2007   CERVICAL DISCECTOMY  2002   fusion and plate C 3   CHOLECYSTECTOMY N/A 08/13/2013   Procedure: LAPAROSCOPIC CHOLECYSTECTOMY WITH INTRAOPERATIVE CHOLANGIOGRAM;  Surgeon: Alm VEAR Angle, MD;  Location: [facility] OR;  Service: General;  Laterality: N/A;   CLOSED REDUCTION METATARSAL FRACTURE     rt side 06-03-18, left side 08-19-17   COLONOSCOPY  2018   conization of cervix  1982   with D&C   CYSTOCELE REPAIR  2003   rectocele repair   EYE SURGERY Bilateral yrs ago   lasik   ORIF ANKLE FRACTURE Right 08/20/2017   Procedure: OPEN REDUCTION INTERNAL FIXATION (ORIF) ANKLE FRACTURE;  Surgeon: Addie Cordella Hamilton, MD;  Location: [facility];  Service: Orthopedics;  Laterality: Right;   SHOULDER ARTHROSCOPY Right 2004   frozen shoulder   TONSILLECTOMY  as child   adenoids removed   TRANSANAL HEMORRHOIDAL DEARTERIALIZATION N/A 07/30/2020   Procedure: TRANSANAL HEMORRHOIDAL DEARTERIALIZATION;  Surgeon: Debby Hila, MD;  Location: [facility] [place];  Service: General;  Laterality: N/A;   VAGINAL HYSTERECTOMY  1986   partial     Medications:  Outpatient Encounter Medications as of 05/06/2024  Medication Sig   albuterol  (VENTOLIN  HFA) 108 (90 Base) MCG/ACT inhaler Inhale 2 puffs into the lungs every 6 (six) hours as needed for wheezing or shortness of breath.   Budesonide  (PULMICORT  FLEXHALER) 90 MCG/ACT inhaler Inhale 1 puff into the lungs 2 (two) times daily.   calcium  carbonate (OSCAL) 1500 (600 Ca) MG TABS tablet Take 600 mg of elemental calcium  by mouth daily.   Cholecalciferol (D3 2000 PO) Take by mouth. (Patient  taking differently: Take 125 mcg by mouth.)   ezetimibe  (ZETIA ) 10 MG tablet Take 1 tablet (10 mg total) by mouth daily.   fluticasone  (FLONASE ) 50 MCG/ACT nasal spray SPRAY 2 SPRAYS INTO EACH NOSTRIL EVERY DAY   gabapentin  (NEURONTIN ) 100 MG capsule TAKE 1 CAPSULE BY MOUTH THREE TIMES A DAY (Patient taking differently: 100 mg 2 (two) times daily. Take one capsule in the morning and two capsules at bedtime)   meclizine  (ANTIVERT ) 12.5 MG tablet Take 1 tablet (12.5 mg total) by mouth 3 (three) times daily as needed.   MULTIPLE VITAMIN tablet Take 1 tablet by mouth daily.   nabumetone  (RELAFEN ) 750 MG tablet 1 tab by mouth twice per day as needed   omeprazole (PRILOSEC) 40 MG capsule Take 40 mg by mouth daily.   rosuvastatin  (CRESTOR ) 10 MG tablet Take 1 tablet (10 mg total) by mouth daily.   vitamin B-12 (CYANOCOBALAMIN ) 1000 MCG tablet Take 5,000 mcg by mouth daily.  Cholecalciferol (VITAMIN D ) 125 MCG (5000 UT) CAPS Take 125 mcg by mouth daily.   levofloxacin  (LEVAQUIN ) 500 MG tablet Take 1 tablet (500 mg total) by mouth daily.   [DISCONTINUED] predniSONE  (DELTASONE ) 10 MG tablet 3 tabs by mouth per day for 3 days,2tabs per day for 3 days,1tab per day for 3 days (Patient not taking: Reported on 05/06/2024)   No facility-administered encounter medications on file as of 05/06/2024.    Allergies:  Allergies  Allergen Reactions   Amoxicillin     REACTION: unspecified   Clavulanic Acid    Penicillins Other (See Comments)    Unknown reaction, mother had reaction almost died    Family History: Family History  Problem Relation Age of Onset   Breast cancer Mother        dx in her last 6's   Heart disease Sister    Breast cancer Sister        dx in her late 57's   Diabetes Brother    Heart disease Brother        triple bypass   Diabetes Maternal Aunt    Diabetes Maternal Uncle    Cancer Maternal Grandmother        uterine   Breast cancer Cousin    Thyroid  cancer Other        2  cousins   Colon cancer Neg Hx    Esophageal cancer Neg Hx    Liver cancer Neg Hx    Pancreatic cancer Neg Hx    Rectal cancer Neg Hx    Stomach cancer Neg Hx     Social History: Social History   Tobacco Use   Smoking status: Never    Passive exposure: Past   Smokeless tobacco: Never  Vaping Use   Vaping status: Never Used  Substance Use Topics   Alcohol  use: Yes    Alcohol /week: 0.0 standard drinks of alcohol     Comment: occ wine   Drug use: No   Social History   Social History Narrative   Lives with husband.      Are you right handed or left handed? Right Handed    Are you currently employed ? No    What is your current occupation? Retired    Do you live at home alone? No    Who lives with you? Husband    What type of home do you live in: 1 story or 2 story? Two story home. Lives on first floor.        Vital Signs:  BP 129/79   Pulse 85   Ht 5' 7 (1.702 m)   Wt 234 lb (106.1 kg)   SpO2 95%   BMI 36.65 kg/m   Neurological Exam: MENTAL STATUS including orientation to time, place, person, recent and remote memory, attention span and concentration, language, and fund of knowledge is normal.  Speech is not dysarthric.  CRANIAL NERVES: II:  No visual field defects.     III-IV-VI: Pupils equal round and reactive to light.  Normal conjugate, extra-ocular eye movements in all directions of gaze.  No nystagmus.  No ptosis.   V:  Normal facial sensation.    VII:  Normal facial symmetry and movements.   VIII:  Normal hearing and vestibular function.   IX-X:  Normal palatal movement.   XI:  Normal shoulder shrug and head rotation.   XII:  Normal tongue strength and range of motion, no deviation or fasciculation.  MOTOR:  No atrophy, fasciculations or abnormal  movements.  No pronator drift.   Upper Extremity:  Right  Left  Deltoid  5/5   5/5   Biceps  5/5   5/5   Triceps  5/5   5/5   Wrist extensors  5/5   5/5   Wrist flexors  5/5   5/5   Finger extensors  5/5    5/5   Finger flexors  5/5   5/5   Dorsal interossei  5/5   5/5   Abductor pollicis  5/5   5/5   Tone (Ashworth scale)  0  0   Lower Extremity:  Right  Left  Hip flexors  5/5   5/5   Knee flexors  5/5   5/5   Knee extensors  5/5   5/5   Dorsiflexors  5/5   5/5   Plantarflexors  5/5   5/5   Toe extensors  5/5   5/5   Toe flexors  5/5   5/5   Tone (Ashworth scale)  0  0   MSRs:                                           Right        Left brachioradialis 2+  2+  biceps 2+  2+  triceps 2+  2+  patellar 1+  1+  ankle jerk 0  0  Hoffman no  no  plantar response down  down   SENSORY:  Normal and symmetric perception of light touch, pinprick, vibration, and proprioception.  Romberg's sign absent.   COORDINATION/GAIT: Normal finger-to- nose-finger.  Intact rapid alternating movements bilaterally. Gait narrow based and stable. Mild unsteadiness with tandem gait, but able to perform.  Stressed gait intact.   Total time spent reviewing records, interview, history/exam, documentation, and coordination of care on day of encounter:  40 minutes    Thank you for allowing me to participate in patient's care.  If I can answer any additional questions, I would be pleased to do so.    Sincerely,    Monte Zinni K. Tobie, DO

## 2024-05-14 ENCOUNTER — Ambulatory Visit: Payer: Self-pay | Admitting: Neurology

## 2024-05-14 LAB — HEAVY METALS PANEL, BLOOD
Arsenic: <3 ug/L (ref <23)
Lead: <1 ug/dL (ref <3.5)
Mercury, B: <5 ug/L (ref <11)

## 2024-05-14 LAB — IMMUNOFIXATION ELECTROPHORESIS
IgM, Serum: 119 mg/dL (ref 50–300)
IgM, Serum: 820 mg/dL (ref 600–300)
Immunoglobulin A: 248 mg/dL (ref 70–320)
Immunoglobulin A: 820 mg/dL (ref 70–320)

## 2024-05-14 LAB — PROTEIN ELECTROPHORESIS, SERUM
Albumin ELP: 3.8 g/dL (ref 3.8–4.8)
Alpha 1: 0.3 g/dL (ref 0.2–0.3)
Alpha 2: 0.9 g/dL (ref 0.5–0.9)
Beta 2: 0.4 g/dL (ref 0.2–0.5)
Beta Globulin: 0.5 g/dL (ref 0.4–0.6)
Gamma Globulin: 0.8 g/dL (ref 0.8–1.7)
Total Protein: 6.7 g/dL (ref 6.1–8.1)

## 2024-05-14 LAB — FOLATE: Folate: >24 ng/mL

## 2024-05-14 LAB — COPPER, SERUM: Copper: 141 ug/dL (ref 70–175)

## 2024-05-27 ENCOUNTER — Ambulatory Visit: Admitting: Neurology

## 2024-08-06 ENCOUNTER — Other Ambulatory Visit: Payer: Self-pay | Admitting: Internal Medicine

## 2024-08-08 DIAGNOSIS — Z1231 Encounter for screening mammogram for malignant neoplasm of breast: Secondary | ICD-10-CM | POA: Diagnosis not present

## 2024-08-08 LAB — HM MAMMOGRAPHY

## 2024-08-09 ENCOUNTER — Telehealth: Payer: Self-pay | Admitting: Internal Medicine

## 2024-08-09 ENCOUNTER — Encounter: Payer: Self-pay | Admitting: Internal Medicine

## 2024-08-09 MED ORDER — ROSUVASTATIN CALCIUM 10 MG PO TABS: 10.0000 mg | ORAL_TABLET | Freq: Every day | ORAL | 2 refills | Status: DC

## 2024-08-09 NOTE — Telephone Encounter (Signed)
*  STAT* If patient is at the pharmacy, call can be transferred to refill team.   1. Which medications need to be refilled? (please list name of each medication and dose if known)   rosuvastatin  (CRESTOR ) 10 MG tablet    2. Which pharmacy/location (including street and city if local pharmacy) is medication to be sent to?  [id number] [place], [place] - [address]      3. Do they need a 30 day or 90 day supply? 90 day    Pt is out of medication

## 2024-08-09 NOTE — Telephone Encounter (Signed)
 RX sent in

## 2024-08-16 ENCOUNTER — Ambulatory Visit: Admitting: Internal Medicine

## 2024-08-19 ENCOUNTER — Other Ambulatory Visit: Payer: Self-pay

## 2024-08-19 ENCOUNTER — Encounter (HOSPITAL_BASED_OUTPATIENT_CLINIC_OR_DEPARTMENT_OTHER): Payer: Self-pay | Admitting: Emergency Medicine

## 2024-08-19 ENCOUNTER — Emergency Department (HOSPITAL_BASED_OUTPATIENT_CLINIC_OR_DEPARTMENT_OTHER)
Admission: EM | Admit: 2024-08-19 | Discharge: 2024-08-19 | Disposition: A | Discharge status: Home / Self Care | Attending: Emergency Medicine | Admitting: Emergency Medicine

## 2024-08-19 DIAGNOSIS — Z79899 Other long term (current) drug therapy: Secondary | ICD-10-CM | POA: Insufficient documentation

## 2024-08-19 DIAGNOSIS — R21 Rash and other nonspecific skin eruption: Secondary | ICD-10-CM | POA: Insufficient documentation

## 2024-08-19 MED ORDER — VALACYCLOVIR HCL 1 G PO TABS: 1000.0000 mg | ORAL_TABLET | Freq: Three times a day (TID) | ORAL | 0 refills | Status: DC

## 2024-08-19 NOTE — ED Provider Notes (Signed)
 [place] [facility] Provider Note   CSN: [id number] Arrival date & time: 08/19/24  1537     Patient presents with: Rash   [name] is a 73 y.o. female.   Patient presents to the emergency department today for evaluation of rash.  Patient first noted small red bump on the left flank area about 2 weeks ago.  She has developed a few other small skin lesions that are raised in the flank area on the left side, not crossing midline.  About 4 to 5 days ago patient developed pains that come and go, described as a shock in the same area.  Extends to midline but does not cross.  Patient denies fevers, chest pain, shortness of breath, vomiting.  She did have diarrhea about a week ago that resolved.  No urinary symptoms.  She does not endorse excessive skin sensitivity.       Prior to Admission medications   Medication Sig Start Date End Date Taking? Authorizing Provider  valACYclovir (VALTREX) 1000 MG tablet Take 1 tablet (1,000 mg total) by mouth 3 (three) times daily. 08/19/24  Yes Desiderio Chew, PA-C  albuterol  (VENTOLIN  HFA) 108 (90 Base) MCG/ACT inhaler Inhale 2 puffs into the lungs every 6 (six) hours as needed for wheezing or shortness of breath. 10/03/22   [name], MD  Budesonide  (PULMICORT  FLEXHALER) 90 MCG/ACT inhaler Inhale 1 puff into the lungs 2 (two) times daily. 01/04/23   Cobb, Comer GAILS, NP  calcium  carbonate (OSCAL) 1500 (600 Ca) MG TABS tablet Take 600 mg of elemental calcium  by mouth daily.    [provider]  Cholecalciferol (D3 2000 PO) Take by mouth. Patient taking differently: Take 125 mcg by mouth.    [provider]  Cholecalciferol (VITAMIN D ) 125 MCG (5000 UT) CAPS Take 125 mcg by mouth daily.    [provider]  ezetimibe  (ZETIA ) 10 MG tablet Take 1 tablet (10 mg total) by mouth daily. 02/05/24   Acharya, Gayatri A, MD  fluticasone  (FLONASE ) 50 MCG/ACT nasal spray SPRAY 2 SPRAYS INTO EACH  NOSTRIL EVERY DAY 07/22/23   Cobb, Comer GAILS, NP  gabapentin  (NEURONTIN ) 100 MG capsule TAKE 1 CAPSULE BY MOUTH THREE TIMES A DAY 08/06/24   [name], MD  levofloxacin  (LEVAQUIN ) 500 MG tablet Take 1 tablet (500 mg total) by mouth daily. 09/01/23   [name], MD  meclizine  (ANTIVERT ) 12.5 MG tablet Take 1 tablet (12.5 mg total) by mouth 3 (three) times daily as needed. 09/01/23 08/31/24  [name], MD  MULTIPLE VITAMIN tablet Take 1 tablet by mouth daily.    [provider]  nabumetone  (RELAFEN ) 750 MG tablet 1 tab by mouth twice per day as needed 08/15/23   [name], MD  omeprazole (PRILOSEC) 40 MG capsule Take 40 mg by mouth daily.    [provider]  rosuvastatin  (CRESTOR ) 10 MG tablet Take 1 tablet (10 mg total) by mouth daily. 08/09/24   Acharya, Gayatri A, MD  vitamin B-12 (CYANOCOBALAMIN ) 1000 MCG tablet Take 5,000 mcg by mouth daily.    [provider]    Allergies: Amoxicillin, Clavulanic acid, and Penicillins    Review of Systems  Updated Vital Signs BP (!) 142/73 (BP Location: Left Arm)   Pulse 87   Temp 98.6 F (37 C)   Resp 18   Ht 5' 7 (1.702 m)   Wt 106.6 kg   SpO2 98%   BMI 36.81  kg/m   Physical Exam Vitals and nursing note reviewed.  Constitutional:      Appearance: She is well-developed.  HENT:     Head: Normocephalic and atraumatic.  Eyes:     Conjunctiva/sclera: Conjunctivae normal.  Pulmonary:     Effort: No respiratory distress.  Abdominal:     Tenderness: There is no abdominal tenderness.  Musculoskeletal:     Cervical back: Normal range of motion and neck supple.  Skin:    General: Skin is warm and dry.     Comments: Patient with 5-6 small red skin papules, that are in bounds of a dermatomal distribution on the left flank and abdomen.  This is the area where the patient feels the pain.  I do not see any signs of cellulitis or abscess.  No coalesced pustules or crusting.   Neurological:     Mental Status:  She is alert.     (all labs ordered are listed, but only abnormal results are displayed) Labs Reviewed - No data to display  EKG: None  Radiology: No results found.   Procedures   Medications Ordered in the ED - No data to display  ED Course  Patient seen and examined. History obtained directly from patient. Work-up including labs, imaging, EKG ordered in triage, if performed, were reviewed.    Labs/EKG: None ordered  Imaging: None ordered  Medications/Fluids: Ordered: None ordered  Most recent vital signs reviewed and are as follows: BP (!) 142/73 (BP Location: Left Arm)   Pulse 87   Temp 98.6 F (37 C)   Resp 18   Ht 5' 7 (1.702 m)   Wt 106.6 kg   SpO2 98%   BMI 36.81 kg/m   Initial impression: Nonspecific rash.  The distribution and associated pain with the rash would be concerning for shingles rash.  This is not appear to be particularly cellulitic or like a dermatitis.  Patient without abdominal tenderness or other significant symptoms, other than some diarrhea that has resolved.  Low concern for intra-abdominal problem at this time.  Home treatment plan: Will place patient on course of Valtrex.  No history of liver or kidney issues.  Return instructions discussed with patient: Return with worsening symptoms, spreading rash, fever.  Follow-up instructions discussed with patient: PCP in 1 week for reevaluation or if pain persists.                                    Medical Decision Making Risk Prescription drug management.   Patient with a few small raised papules in a dermatomal distribution, however not classic for shingles.  Patient's most prominent complaint is pain in the area that seems to follow a dermatomal distribution in the same location as these few scattered raised areas.  Due to concern for possible shingles, will give course of Valtrex.  This does not appear to be a bacterial infection at this point I do not feel that antibiotics are  indicated.  It does not appear to be particular allergic or like dermatitis.  Area will need closely monitored if she developed worsening rash or symptoms, or developed more concerning abdominal symptoms, would need reassessment.  Otherwise PCP follow-up would be reasonable.     Final diagnoses:  Rash    ED Discharge Orders          Ordered    valACYclovir (VALTREX) 1000 MG tablet  3 times daily  08/19/24 1558               Desiderio Chew, PA-C 08/19/24 1614    Emil Share, DO 08/19/24 1755

## 2024-08-19 NOTE — ED Triage Notes (Signed)
 Rash on left lower back that goes around to abd has some bumps  and abd pain

## 2024-08-19 NOTE — Discharge Instructions (Addendum)
 Please read and follow all provided instructions.  Your diagnoses today include:  1. Rash    Tests performed today include: Vital signs. See below for your results today.   Medications prescribed:  Valtrex: antiviral medication for possible shingles  Take any prescribed medications only as directed.  Home care instructions:  Follow any educational materials contained in this packet.  BE VERY CAREFUL not to take multiple medicines containing Tylenol  (also called acetaminophen ). Doing so can lead to an overdose which can damage your liver and cause liver failure and possibly death.   Follow-up instructions: Please follow-up with your primary care provider in the next 5-7 days for further evaluation of your symptoms.   Return instructions:  Please return to the Emergency Department if you experience worsening symptoms.  Please return if you have any other emergent concerns.  Additional Information:  Your vital signs today were: BP (!) 142/73 (BP Location: Left Arm)   Pulse 87   Temp 98.6 F (37 C)   Resp 18   Ht 5' 7 (1.702 m)   Wt 106.6 kg   SpO2 98%   BMI 36.81 kg/m  If your blood pressure (BP) was elevated above 135/85 this visit, please have this repeated by your doctor within one month. --------------

## 2024-08-25 ENCOUNTER — Other Ambulatory Visit: Payer: Self-pay | Admitting: Internal Medicine

## 2024-08-26 ENCOUNTER — Other Ambulatory Visit: Payer: Self-pay

## 2024-09-20 ENCOUNTER — Ambulatory Visit (INDEPENDENT_AMBULATORY_CARE_PROVIDER_SITE_OTHER): Admitting: Internal Medicine

## 2024-09-20 ENCOUNTER — Encounter: Payer: Self-pay | Admitting: Internal Medicine

## 2024-09-20 ENCOUNTER — Ambulatory Visit: Payer: Self-pay | Admitting: Internal Medicine

## 2024-09-20 ENCOUNTER — Other Ambulatory Visit: Payer: Self-pay | Admitting: Internal Medicine

## 2024-09-20 VITALS — BP 126/78 | HR 58 | Temp 98.1°F | Ht 67.0 in | Wt 233.0 lb

## 2024-09-20 DIAGNOSIS — G609 Hereditary and idiopathic neuropathy, unspecified: Secondary | ICD-10-CM | POA: Insufficient documentation

## 2024-09-20 DIAGNOSIS — G5602 Carpal tunnel syndrome, left upper limb: Secondary | ICD-10-CM | POA: Insufficient documentation

## 2024-09-20 DIAGNOSIS — E559 Vitamin D deficiency, unspecified: Secondary | ICD-10-CM | POA: Diagnosis not present

## 2024-09-20 DIAGNOSIS — B0229 Other postherpetic nervous system involvement: Secondary | ICD-10-CM | POA: Diagnosis not present

## 2024-09-20 DIAGNOSIS — R7302 Impaired glucose tolerance (oral): Secondary | ICD-10-CM | POA: Diagnosis not present

## 2024-09-20 DIAGNOSIS — M7041 Prepatellar bursitis, right knee: Secondary | ICD-10-CM

## 2024-09-20 DIAGNOSIS — Z23 Encounter for immunization: Secondary | ICD-10-CM | POA: Diagnosis not present

## 2024-09-20 DIAGNOSIS — E7800 Pure hypercholesterolemia, unspecified: Secondary | ICD-10-CM

## 2024-09-20 DIAGNOSIS — E538 Deficiency of other specified B group vitamins: Secondary | ICD-10-CM | POA: Diagnosis not present

## 2024-09-20 LAB — VITAMIN B12: Vitamin B-12: 694 pg/mL (ref 211–911)

## 2024-09-20 LAB — LIPID PANEL
Cholesterol: 168 mg/dL (ref 0–200)
HDL: 48.4 mg/dL (ref >39.00)
LDL Cholesterol: 92 mg/dL (ref 0–99)
NonHDL: 119.98
Total CHOL/HDL Ratio: 3
Triglycerides: 138 mg/dL (ref 0.0–149.0)
VLDL: 27.6 mg/dL (ref 0.0–40.0)

## 2024-09-20 LAB — HEPATIC FUNCTION PANEL
ALT: 16 U/L (ref 0–35)
AST: 18 U/L (ref 0–37)
Albumin: 4 g/dL (ref 3.5–5.2)
Alkaline Phosphatase: 64 U/L (ref 39–117)
Bilirubin, Direct: 0.1 mg/dL (ref 0.0–0.3)
Total Bilirubin: 0.8 mg/dL (ref 0.2–1.2)
Total Protein: 6.7 g/dL (ref 6.0–8.3)

## 2024-09-20 LAB — VITAMIN D 25 HYDROXY (VIT D DEFICIENCY, FRACTURES): VITD: 69.25 ng/mL (ref 30.00–100.00)

## 2024-09-20 LAB — CBC WITH DIFFERENTIAL/PLATELET
Basophils Absolute: 0.1 K/uL (ref 0.0–0.1)
Basophils Relative: 0.8 % (ref 0.0–3.0)
Eosinophils Absolute: 0.5 K/uL (ref 0.0–0.7)
Eosinophils Relative: 6.4 % — ABNORMAL HIGH (ref 0.0–5.0)
HCT: 39.3 % (ref 36.0–46.0)
Hemoglobin: 13.1 g/dL (ref 12.0–15.0)
Lymphocytes Relative: 39.8 % (ref 12.0–46.0)
Lymphs Abs: 2.9 K/uL (ref 0.7–4.0)
MCHC: 33.5 g/dL (ref 30.0–36.0)
MCV: 88.4 fl (ref 78.0–100.0)
Monocytes Absolute: 0.5 K/uL (ref 0.1–1.0)
Monocytes Relative: 7.1 % (ref 3.0–12.0)
Neutro Abs: 3.4 K/uL (ref 1.4–7.7)
Neutrophils Relative %: 45.9 % (ref 43.0–77.0)
Platelets: 226 K/uL (ref 150.0–400.0)
RBC: 4.44 Mil/uL (ref 3.87–5.11)
RDW: 14.1 % (ref 11.5–15.5)
WBC: 7.4 K/uL (ref 4.0–10.5)

## 2024-09-20 LAB — URINALYSIS, ROUTINE W REFLEX MICROSCOPIC
Bilirubin Urine: NEGATIVE
Hgb urine dipstick: NEGATIVE
Ketones, ur: NEGATIVE
Leukocytes,Ua: NEGATIVE
Nitrite: POSITIVE — AB
Specific Gravity, Urine: 1.015 (ref 1.000–1.030)
Total Protein, Urine: NEGATIVE
Urine Glucose: NEGATIVE
Urobilinogen, UA: 0.2 (ref 0.0–1.0)
pH: 7 (ref 5.0–8.0)

## 2024-09-20 LAB — BASIC METABOLIC PANEL WITH GFR
BUN: 19 mg/dL (ref 6–23)
CO2: 29 meq/L (ref 19–32)
Calcium: 9.2 mg/dL (ref 8.4–10.5)
Chloride: 104 meq/L (ref 96–112)
Creatinine, Ser: 0.88 mg/dL (ref 0.40–1.20)
GFR: 66.34 mL/min (ref >60.00)
Glucose, Bld: 80 mg/dL (ref 70–99)
Potassium: 3.9 meq/L (ref 3.5–5.1)
Sodium: 140 meq/L (ref 135–145)

## 2024-09-20 LAB — TSH: TSH: 2.63 u[IU]/mL (ref 0.35–5.50)

## 2024-09-20 LAB — HEMOGLOBIN A1C: Hgb A1c MFr Bld: 5.9 % (ref 4.6–6.5)

## 2024-09-20 MED ORDER — ROSUVASTATIN CALCIUM 20 MG PO TABS: 20.0000 mg | ORAL_TABLET | Freq: Every day | ORAL | 3 refills | Status: AC

## 2024-09-20 NOTE — Assessment & Plan Note (Signed)
 Lab Results  Component Value Date   HGBA1C 6.0 08/15/2023   Stable, pt to continue current medical treatment  - diet wt control

## 2024-09-20 NOTE — Assessment & Plan Note (Signed)
 Persistent mild on gabapentin  200 bid, declines change today, for shingrix soon

## 2024-09-20 NOTE — Patient Instructions (Addendum)
 You had the flu shot today  Please consider having the Shingrx and Tdap tetanus shots at the pharmacy  Please continue all other medications as before, and refills have been done if requested.  Please have the pharmacy call with any other refills you may need.  Please continue your efforts at being more active, low cholesterol diet, and weight control.  You are otherwise up to date with prevention measures today.  Please keep your appointments with your specialists as you may have planned  Please go to the LAB at the blood drawing area for the tests to be done  You will be contacted by phone if any changes need to be made immediately.  Otherwise, you will receive a letter about your results with an explanation, but please check with MyChart first.  Please make an Appointment to return for your 1 year visit, or sooner if needed

## 2024-09-20 NOTE — Assessment & Plan Note (Signed)
 Lab Results  Component Value Date   LDLCALC 61 10/02/2023   Stable, pt to continue current statin crestor  10 mg and zetia  10 mg qd

## 2024-09-20 NOTE — Assessment & Plan Note (Signed)
Last vitamin D Lab Results  Component Value Date   VD25OH 71.00 08/15/2023   Stable, cont oral replacement

## 2024-09-20 NOTE — Assessment & Plan Note (Signed)
 Pt without pain or worsening knee function, no recent falls ok to follow

## 2024-09-20 NOTE — Progress Notes (Signed)
 Patient ID: [name], female   DOB: Aug 29, 1951, 73 y.o.   MRN: [id number]         Chief Complaint:: yearly exam       HPI:  [name] is a 73 y.o. female here overall doing well;  Pt denies chest pain, increased sob or doe, wheezing, orthopnea, PND, increased LE swelling, palpitations, dizziness or syncope.   Pt denies polydipsia, polyuria, or new focal neuro s/s.    Pt denies fever, wt loss, night sweats, loss of appetite, or other constitutional symptoms   S/p bilateral cataract 2025.  Does have persistent nonpainful prepatellar swelling drained twice in past yr after fall but keeps recurring.  Also had episode shingles outbreak to the LUQ in early oct 2025, rash now resolved, but still with mild persistent neuritic pain.  Already taking gabapentin  for neuropathy which she has been trying to minimize.   Denies worsening depressive symptoms, suicidal ideation, or panic  Due fpr flu shot   Wt Readings from Last 3 Encounters:  09/20/24 233 lb (105.7 kg)  08/19/24 235 lb (106.6 kg)  05/06/24 234 lb (106.1 kg)   BP Readings from Last 3 Encounters:  09/20/24 126/78  08/19/24 (!) 142/73  05/06/24 129/79   Immunization History  Administered Date(s) Administered   Fluad Quad(high Dose 65+) 08/02/2019, 08/06/2020, 08/09/2021, 08/10/2022   Fluad Trivalent(High Dose 65+) 08/15/2023   INFLUENZA, HIGH DOSE SEASONAL PF 09/20/2024   Influenza Split 09/12/2012   Influenza Whole 10/03/2007, 08/16/2010   Influenza,inj,Quad PF,6+ Mos 08/07/2013, 07/18/2014, 07/21/2015, 07/21/2016, 07/28/2017, 07/31/2018   PFIZER(Purple Top)SARS-COV-2 Vaccination 12/13/2019, 01/07/2020, 10/12/2020   PNEUMOCOCCAL CONJUGATE-20 08/15/2023   Pneumococcal Conjugate-13 08/19/2019   Td 09/08/2003   Tdap 03/20/2014   Health Maintenance Due  Topic Date Due   Zoster Vaccines- Shingrix (1 of 2) Never done   DTaP/Tdap/Td (3 - Td or Tdap) 03/20/2024   Medicare Annual Wellness (AWV)  09/24/2024      Past Medical History:   Diagnosis Date   Abnormal Pap smear of vagina 06/17/15   LGSIL   Allergic rhinitis, cause unspecified    Arthritis    ARTHRITIS, KNEE 02/10/2009   Qualifier: Diagnosis of  By: Viviann Raddle MD, Willie R    BRCA negative 03/25/13   Cervical cancer (HCC) 1984   Cervical disc disease 02/11/2012   Chronic lower back pain    Degenerative arthritis of hip 02/11/2012   Diverticulosis 02/11/2012   GERD 08/10/2007   Qualifier: Diagnosis of  By: Delos, CMA, Cindy     GERD (gastroesophageal reflux disease)    GERD (gastroesophageal reflux disease)    Hemorrhoids    HEMORRHOIDS-INTERNAL 07/21/2010   Qualifier: Diagnosis of  By: Kerman NP, Paula     Hiatal hernia    Hyperlipidemia 02/13/2012   Mitral valve prolapse    no cardiologist, mild   OAB (overactive bladder)    Obesity    OVERACTIVE BLADDER 08/10/2007   Qualifier: Diagnosis of  By: Delos, CMA, Cindy     Pneumonia 2017   PONV (postoperative nausea and vomiting)    yrs ago unable to raise arms after female surgery, no recent problems   Recurrent UTI    Sleep apnea    uses cpap set on 2   SUI (stress urinary incontinence, female)    Urinary incontinence    VAGINITIS, ATROPHIC 11/11/2008   Qualifier: Diagnosis of  By: Viviann Raddle MD, Marsha SAUNDERS    Past Surgical History:  Procedure Laterality Date   bladder  tack     x 3 or 4   BUNIONECTOMY Right 2007   CERVICAL DISCECTOMY  2002   fusion and plate C 3   CHOLECYSTECTOMY N/A 08/13/2013   Procedure: LAPAROSCOPIC CHOLECYSTECTOMY WITH INTRAOPERATIVE CHOLANGIOGRAM;  Surgeon: Alm VEAR Angle, MD;  Location: [facility] OR;  Service: General;  Laterality: N/A;   CLOSED REDUCTION METATARSAL FRACTURE     rt side 06-03-18, left side 08-19-17   COLONOSCOPY  2018   conization of cervix  1982   with D&C   CYSTOCELE REPAIR  2003   rectocele repair   EYE SURGERY Bilateral yrs ago   lasik   ORIF ANKLE FRACTURE Right 08/20/2017   Procedure: OPEN REDUCTION INTERNAL FIXATION (ORIF) ANKLE FRACTURE;  Surgeon: Addie Cordella Hamilton, MD;  Location: [facility];  Service: Orthopedics;  Laterality: Right;   SHOULDER ARTHROSCOPY Right 2004   frozen shoulder   TONSILLECTOMY  as child   adenoids removed   TRANSANAL HEMORRHOIDAL DEARTERIALIZATION N/A 07/30/2020   Procedure: TRANSANAL HEMORRHOIDAL DEARTERIALIZATION;  Surgeon: Debby Hila, MD;  Location: [facility] [place];  Service: General;  Laterality: N/A;   VAGINAL HYSTERECTOMY  1986   partial    reports that she has never smoked. She has been exposed to tobacco smoke. She has never used smokeless tobacco. She reports current alcohol  use. She reports that she does not use drugs. family history includes Breast cancer in her cousin, mother, and sister; Cancer in her maternal grandmother; Diabetes in her brother, maternal aunt, and maternal uncle; Heart disease in her brother and sister; Thyroid  cancer in an other family member. Allergies  Allergen Reactions   Amoxicillin     REACTION: unspecified   Clavulanic Acid    Penicillins Other (See Comments)    Unknown reaction, mother had reaction almost died   Current Outpatient Medications on File Prior to Visit  Medication Sig Dispense Refill   albuterol  (VENTOLIN  HFA) 108 (90 Base) MCG/ACT inhaler Inhale 2 puffs into the lungs every 6 (six) hours as needed for wheezing or shortness of breath. 8 g 2   Budesonide  (PULMICORT  FLEXHALER) 90 MCG/ACT inhaler Inhale 1 puff into the lungs 2 (two) times daily. 1 each 5   calcium  carbonate (OSCAL) 1500 (600 Ca) MG TABS tablet Take 600 mg of elemental calcium  by mouth daily.     Cholecalciferol (D3 2000 PO) Take by mouth. (Patient taking differently: Take 125 mcg by mouth.)     ezetimibe  (ZETIA ) 10 MG tablet Take 1 tablet (10 mg total) by mouth daily. 90 tablet 2   fluticasone  (FLONASE ) 50 MCG/ACT nasal spray SPRAY 2 SPRAYS INTO EACH NOSTRIL EVERY DAY 48 mL 3   gabapentin  (NEURONTIN ) 100 MG capsule TAKE 1 CAPSULE BY MOUTH THREE TIMES A DAY 90 capsule 3   ketorolac   (ACULAR ) 0.5 % ophthalmic solution Place 1 drop into the right eye 4 (four) times daily.     MULTIPLE VITAMIN tablet Take 1 tablet by mouth daily.     nabumetone  (RELAFEN ) 750 MG tablet 1 TAB BY MOUTH TWICE PER DAY AS NEEDED 180 tablet 2   omeprazole (PRILOSEC) 40 MG capsule Take 40 mg by mouth daily.     rosuvastatin  (CRESTOR ) 10 MG tablet Take 1 tablet (10 mg total) by mouth daily. 90 tablet 2   valACYclovir (VALTREX) 1000 MG tablet Take 1 tablet (1,000 mg total) by mouth 3 (three) times daily. 21 tablet 0   vitamin B-12 (CYANOCOBALAMIN ) 1000 MCG tablet Take 5,000 mcg by mouth daily.  No current facility-administered medications on file prior to visit.        ROS:  All others reviewed and negative.  Objective        PE:  BP 126/78 (BP Location: Left Arm, Patient Position: Sitting, Cuff Size: Normal)   Pulse (!) 58   Temp 98.1 F (36.7 C) (Oral)   Ht 5' 7 (1.702 m)   Wt 233 lb (105.7 kg)   SpO2 98%   BMI 36.49 kg/m                 Constitutional: Pt appears in NAD               HENT: Head: NCAT.                Right Ear: External ear normal.                 Left Ear: External ear normal.                Eyes: . Pupils are equal, round, and reactive to light. Conjunctivae and EOM are normal               Nose: without d/c or deformity               Neck: Neck supple. Gross normal ROM               Cardiovascular: Normal rate and regular rhythm.                 Pulmonary/Chest: Effort normal and breath sounds without rales or wheezing.                Abd:  Soft, NT, ND, + BS, no organomegaly               Neurological: Pt is alert. At baseline orientation, motor grossly intact               Skin: Skin is warm. No rashes, no other new lesions, LE edema - none               Psychiatric: Pt behavior is normal without agitation   Micro: none  Cardiac tracings I have personally interpreted today:  none  Pertinent Radiological findings (summarize): none   Lab Results   Component Value Date   WBC 9.7 08/15/2023   HGB 13.5 08/15/2023   HCT 41.8 08/15/2023   PLT 255.0 08/15/2023   GLUCOSE 92 08/15/2023   CHOL 130 10/02/2023   TRIG 94 10/02/2023   HDL 51 10/02/2023   LDLDIRECT 135.1 05/08/2012   LDLCALC 61 10/02/2023   ALT 13 08/15/2023   AST 17 08/15/2023   NA 141 08/15/2023   K 4.0 08/15/2023   CL 104 08/15/2023   CREATININE 0.90 08/15/2023   BUN 18 08/15/2023   CO2 28 08/15/2023   TSH 2.83 08/15/2023   HGBA1C 6.0 08/15/2023   Assessment/Plan:  NESIAH JUMP is a 73 y.o. White or Caucasian [1] female with  has a past medical history of Abnormal Pap smear of vagina (06/17/15), Allergic rhinitis, cause unspecified, Arthritis, ARTHRITIS, KNEE (02/10/2009), BRCA negative (03/25/13), Cervical cancer (HCC) (1984), Cervical disc disease (02/11/2012), Chronic lower back pain, Degenerative arthritis of hip (02/11/2012), Diverticulosis (02/11/2012), GERD (08/10/2007), GERD (gastroesophageal reflux disease), GERD (gastroesophageal reflux disease), Hemorrhoids, HEMORRHOIDS-INTERNAL (07/21/2010), Hiatal hernia, Hyperlipidemia (02/13/2012), Mitral valve prolapse, OAB (overactive bladder), Obesity, OVERACTIVE BLADDER (08/10/2007), Pneumonia (2017), PONV (postoperative nausea and vomiting), Recurrent UTI, Sleep apnea,  SUI (stress urinary incontinence, female), Urinary incontinence, and VAGINITIS, ATROPHIC (11/11/2008).  PHN (postherpetic neuralgia) Persistent mild on gabapentin  200 bid, declines change today, for shingrix soon  Prepatellar bursitis of right knee Pt without pain or worsening knee function, no recent falls ok to follow  Vitamin D  deficiency Last vitamin D  Lab Results  Component Value Date   VD25OH 71.00 08/15/2023   Stable, cont oral replacement   Impaired glucose tolerance Lab Results  Component Value Date   HGBA1C 6.0 08/15/2023   Stable, pt to continue current medical treatment  - diet wt control   Hyperlipidemia Lab Results  Component Value Date    LDLCALC 61 10/02/2023   Stable, pt to continue current statin crestor  10 mg and zetia  10 mg qd  Followup: Return in about 1 year (around 09/20/2025).  Lynwood Rush, MD 09/20/2024 10:12 AM [facility]

## 2024-10-07 DIAGNOSIS — D1801 Hemangioma of skin and subcutaneous tissue: Secondary | ICD-10-CM | POA: Diagnosis not present

## 2024-10-07 DIAGNOSIS — L814 Other melanin hyperpigmentation: Secondary | ICD-10-CM | POA: Diagnosis not present

## 2024-10-07 DIAGNOSIS — L820 Inflamed seborrheic keratosis: Secondary | ICD-10-CM | POA: Diagnosis not present

## 2024-10-07 DIAGNOSIS — L821 Other seborrheic keratosis: Secondary | ICD-10-CM | POA: Diagnosis not present

## 2024-10-08 ENCOUNTER — Emergency Department (HOSPITAL_BASED_OUTPATIENT_CLINIC_OR_DEPARTMENT_OTHER): Admission: EM | Admit: 2024-10-08 | Discharge: 2024-10-08 | Disposition: A | Discharge status: Home / Self Care

## 2024-10-08 ENCOUNTER — Other Ambulatory Visit: Payer: Self-pay

## 2024-10-08 ENCOUNTER — Encounter (HOSPITAL_BASED_OUTPATIENT_CLINIC_OR_DEPARTMENT_OTHER): Payer: Self-pay

## 2024-10-08 ENCOUNTER — Emergency Department (HOSPITAL_BASED_OUTPATIENT_CLINIC_OR_DEPARTMENT_OTHER)

## 2024-10-08 DIAGNOSIS — R197 Diarrhea, unspecified: Secondary | ICD-10-CM | POA: Insufficient documentation

## 2024-10-08 DIAGNOSIS — R109 Unspecified abdominal pain: Secondary | ICD-10-CM | POA: Diagnosis not present

## 2024-10-08 DIAGNOSIS — R1033 Periumbilical pain: Secondary | ICD-10-CM | POA: Diagnosis not present

## 2024-10-08 DIAGNOSIS — R160 Hepatomegaly, not elsewhere classified: Secondary | ICD-10-CM | POA: Diagnosis not present

## 2024-10-08 DIAGNOSIS — K429 Umbilical hernia without obstruction or gangrene: Secondary | ICD-10-CM | POA: Diagnosis not present

## 2024-10-08 DIAGNOSIS — K5730 Diverticulosis of large intestine without perforation or abscess without bleeding: Secondary | ICD-10-CM | POA: Diagnosis not present

## 2024-10-08 LAB — URINALYSIS, ROUTINE W REFLEX MICROSCOPIC
Bilirubin Urine: NEGATIVE
Glucose, UA: NEGATIVE mg/dL
Hgb urine dipstick: NEGATIVE
Ketones, ur: NEGATIVE mg/dL
Nitrite: POSITIVE — AB
Protein, ur: NEGATIVE mg/dL
Specific Gravity, Urine: >=1.03 (ref 1.005–1.030)
pH: 5.5 (ref 5.0–8.0)

## 2024-10-08 LAB — URINALYSIS, MICROSCOPIC (REFLEX)

## 2024-10-08 LAB — CBC
HCT: 40.8 % (ref 36.0–46.0)
Hemoglobin: 13.7 g/dL (ref 12.0–15.0)
MCH: 29.4 pg (ref 26.0–34.0)
MCHC: 33.6 g/dL (ref 30.0–36.0)
MCV: 87.6 fL (ref 80.0–100.0)
Platelets: 284 K/uL (ref 150–400)
RBC: 4.66 MIL/uL (ref 3.87–5.11)
RDW: 12.8 % (ref 11.5–15.5)
WBC: 10.7 K/uL — ABNORMAL HIGH (ref 4.0–10.5)
nRBC: 0 % (ref 0.0–0.2)

## 2024-10-08 LAB — COMPREHENSIVE METABOLIC PANEL WITH GFR
ALT: 13 U/L (ref 0–44)
AST: 25 U/L (ref 15–41)
Albumin: 4.2 g/dL (ref 3.5–5.0)
Alkaline Phosphatase: 84 U/L (ref 38–126)
Anion gap: 16 — ABNORMAL HIGH (ref 5–15)
BUN: 21 mg/dL (ref 8–23)
CO2: 19 mmol/L — ABNORMAL LOW (ref 22–32)
Calcium: 9.7 mg/dL (ref 8.9–10.3)
Chloride: 104 mmol/L (ref 98–111)
Creatinine, Ser: 1.06 mg/dL — ABNORMAL HIGH (ref 0.44–1.00)
GFR, Estimated: 56 mL/min — ABNORMAL LOW (ref >60)
Glucose, Bld: 114 mg/dL — ABNORMAL HIGH (ref 70–99)
Potassium: 3.6 mmol/L (ref 3.5–5.1)
Sodium: 139 mmol/L (ref 135–145)
Total Bilirubin: 0.8 mg/dL (ref 0.0–1.2)
Total Protein: 7.1 g/dL (ref 6.5–8.1)

## 2024-10-08 LAB — LIPASE, BLOOD: Lipase: 28 U/L (ref 11–51)

## 2024-10-08 MED ORDER — SODIUM CHLORIDE 0.9 % IV BOLUS
1000.0000 mL | Freq: Once | INTRAVENOUS | Status: AC
  Administered 2024-10-08: 1000 mL via INTRAVENOUS

## 2024-10-08 MED ORDER — IOHEXOL 300 MG/ML  SOLN
125.0000 mL | Freq: Once | INTRAMUSCULAR | Status: AC | PRN
  Administered 2024-10-08: 125 mL via INTRAVENOUS

## 2024-10-08 MED ORDER — ONDANSETRON HCL 4 MG/2ML IJ SOLN: 4.0000 mg | Freq: Once | INTRAMUSCULAR | Status: DC

## 2024-10-08 MED ORDER — MORPHINE SULFATE (PF) 4 MG/ML IV SOLN: 4.0000 mg | Freq: Once | INTRAVENOUS | Status: DC

## 2024-10-08 MED ORDER — CIPROFLOXACIN HCL 500 MG PO TABS: 500.0000 mg | ORAL_TABLET | Freq: Two times a day (BID) | ORAL | 0 refills | Status: DC

## 2024-10-08 NOTE — ED Provider Notes (Signed)
 [facility] Provider Note   CSN: [id number] Arrival date & time: 10/08/24  1356     Patient presents with: Abdominal Pain   [name] is a 73 y.o. female.    Abdominal Pain Associated symptoms: diarrhea (On Saturday but not since)   Associated symptoms: no chest pain, no constipation, no dysuria, no fatigue, no fever, no nausea, no shortness of breath and no vomiting    73 y/o female presenting with a cc of abd pain in there umbilical area. She started feeling this pain around 11/27. The pain starts around her umbilicus and travels up. The pain in worse with movement and pressing on it. She feels as though she has had a decrease in her appetite. She tried taking Gasx and report explosive diarrhea on Saturday since then she has had normal bowel movements. The pain decreased after taking the Gasx but then returned and has been progressively getting worse. She has had her gallbladder removed. She feels as though she has also had some bloating. Denies any rectal bleeding or recent travel.    Prior to Admission medications   Medication Sig Start Date End Date Taking? Authorizing Provider  ciprofloxacin  (CIPRO ) 500 MG tablet Take 1 tablet (500 mg total) by mouth every 12 (twelve) hours. 10/08/24  Yes Rosaline Almarie MATSU, PA-C  rosuvastatin  (CRESTOR ) 20 MG tablet Take 1 tablet (20 mg total) by mouth daily. 09/20/24   [name], MD  albuterol  (VENTOLIN  HFA) 108 [phone] Base) MCG/ACT inhaler Inhale 2 puffs into the lungs every 6 (six) hours as needed for wheezing or shortness of breath. 10/03/22   [name], MD  Budesonide  (PULMICORT  FLEXHALER) 90 MCG/ACT inhaler Inhale 1 puff into the lungs 2 (two) times daily. 01/04/23   Cobb, Comer GAILS, NP  calcium  carbonate (OSCAL) 1500 (600 Ca) MG TABS tablet Take 600 mg of elemental calcium  by mouth daily.    [provider]  Cholecalciferol (D3 2000 PO) Take by mouth. Patient taking  differently: Take 125 mcg by mouth.    [provider]  ezetimibe  (ZETIA ) 10 MG tablet Take 1 tablet (10 mg total) by mouth daily. 02/05/24   Acharya, Gayatri A, MD  fluticasone  (FLONASE ) 50 MCG/ACT nasal spray SPRAY 2 SPRAYS INTO EACH NOSTRIL EVERY DAY 07/22/23   Cobb, Comer GAILS, NP  gabapentin  (NEURONTIN ) 100 MG capsule TAKE 1 CAPSULE BY MOUTH THREE TIMES A DAY 08/06/24   [name], MD  ketorolac  (ACULAR ) 0.5 % ophthalmic solution Place 1 drop into the right eye 4 (four) times daily. 04/09/24   [provider]  MULTIPLE VITAMIN tablet Take 1 tablet by mouth daily.    [provider]  nabumetone  (RELAFEN ) 750 MG tablet 1 TAB BY MOUTH TWICE PER DAY AS NEEDED 08/26/24   [name], MD  omeprazole (PRILOSEC) 40 MG capsule Take 40 mg by mouth daily.    [provider]  valACYclovir  (VALTREX ) 1000 MG tablet Take 1 tablet (1,000 mg total) by mouth 3 (three) times daily. 08/19/24   Desiderio Chew, PA-C  vitamin B-12 (CYANOCOBALAMIN ) 1000 MCG tablet Take 5,000 mcg by mouth daily.    [provider]    Allergies: Amoxicillin, Clavulanic acid, and Penicillins    Review of Systems  Constitutional:  Positive for appetite change (decrease). Negative for fatigue and fever.  Respiratory:  Negative for shortness of breath.   Cardiovascular:  Negative for chest pain.  Gastrointestinal:  Positive for abdominal distention,  abdominal pain and diarrhea (On Saturday but not since). Negative for constipation, nausea and vomiting.  Genitourinary:  Negative for decreased urine volume, difficulty urinating, dysuria, flank pain and urgency.  Musculoskeletal:  Negative for back pain.  Skin:  Negative for rash.    Updated Vital Signs BP (!) 165/65   Pulse 64   Temp 98.4 F (36.9 C) (Oral)   Resp 16   Ht 5' 7 (1.702 m)   Wt 105.7 kg   SpO2 96%   BMI 36.50 kg/m   Physical Exam Cardiovascular:     Rate and Rhythm: Normal rate.     Heart sounds: Normal heart  sounds.  Pulmonary:     Effort: No respiratory distress.  Abdominal:     General: Bowel sounds are normal. There is no distension or abdominal bruit. There are no signs of injury.     Tenderness: There is abdominal tenderness in the epigastric area. There is no rebound.     Hernia: No hernia is present.     Comments: Pain with light palpation.   Neurological:     Mental Status: She is alert.     (all labs ordered are listed, but only abnormal results are displayed) Labs Reviewed  COMPREHENSIVE METABOLIC PANEL WITH GFR - Abnormal; Notable for the following components:      Result Value   CO2 19 (*)    Glucose, Bld 114 (*)    Creatinine, Ser 1.06 (*)    GFR, Estimated 56 (*)    Anion gap 16 (*)    All other components within normal limits  CBC - Abnormal; Notable for the following components:   WBC 10.7 (*)    All other components within normal limits  URINALYSIS, ROUTINE W REFLEX MICROSCOPIC - Abnormal; Notable for the following components:   Nitrite POSITIVE (*)    Leukocytes,Ua SMALL (*)    All other components within normal limits  URINALYSIS, MICROSCOPIC (REFLEX) - Abnormal; Notable for the following components:   Bacteria, UA LESS THAN 10 mL OF URINE SUBMITTED (*)    All other components within normal limits  URINE CULTURE  LIPASE, BLOOD    EKG: None  Radiology: CT ABDOMEN PELVIS W CONTRAST Result Date: 10/08/2024 CLINICAL DATA:  Acute abdominal pain. EXAM: CT ABDOMEN AND PELVIS WITH CONTRAST TECHNIQUE: Multidetector CT imaging of the abdomen and pelvis was performed using the standard protocol following bolus administration of intravenous contrast. RADIATION DOSE REDUCTION: This exam was performed according to the departmental dose-optimization program which includes automated exposure control, adjustment of the mA and/or kV according to patient size and/or use of iterative reconstruction technique. CONTRAST:  OMNIPAQUE  IOHEXOL  300 MG/ML  SOLN COMPARISON:  None  Available. FINDINGS: Lower chest: Mild atelectasis in the dependent lung bases. No pleural fluid. Hepatobiliary: Enlarged liver spanning greater than 20 cm in cranial caudal dimension. Diffuse hepatic steatosis. There are multiple low-density lesions throughout the liver, largest represent simple cysts. The smaller lesions are too small to characterize. No further follow-up imaging is recommended. Clips in the gallbladder fossa postcholecystectomy. No biliary dilatation. Pancreas: No ductal dilatation or inflammation. Spleen: Normal in size without focal abnormality. Adrenals/Urinary Tract: No adrenal nodule. No hydronephrosis. No renal calculi. Homogeneous renal enhancement with symmetric excretion on delayed phase imaging. Urinary bladder is minimally distended. No bladder wall thickening. High-density material within the anterior base of the bladder may be postoperative, patient with history of cystocele repair. Stomach/Bowel: The stomach is nondistended. Scattered fluid within large and small bowel. Mid  small bowel is borderline dilated, greatest small bowel distension of 2.8 cm. There is general transition from fluid-filled to nondilated small bowel centrally, series 8, image 65, where there is an area of small bowel wall thickening. Fluid throughout the colon which is redundant. Scattered colonic diverticula. No diverticulitis or colonic wall thickening. No mesenteric edema or perienteric inflammation. The appendix is normal. Vascular/Lymphatic: Aortic atherosclerosis. No aortic aneurysm. Patent portal, splenic and mesenteric veins. No lymphadenopathy. Reproductive: Status post hysterectomy. No adnexal masses. Other: No free air, free fluid, or intra-abdominal fluid collection. Diminutive fat containing umbilical hernia. Musculoskeletal: There are no acute or suspicious osseous abnormalities. Degenerative change in the spine, greatest at L4-L5. IMPRESSION: 1. Fluid-filled large and small bowel, with borderline  dilatation of mid small bowel. There is general transition from fluid-filled to nondilated small bowel centrally, where there is an area of small bowel wall thickening. Findings are suggestive of generalized enteritis. 2. Colonic diverticulosis without diverticulitis. 3. Hepatomegaly and hepatic steatosis. Aortic Atherosclerosis (ICD10-I70.0). Electronically Signed   By: Andrea Gasman M.D.   On: 10/08/2024 18:41     Procedures   Medications Ordered in the ED  ondansetron  (ZOFRAN ) injection 4 mg (4 mg Intravenous Not Given 10/08/24 1741)  morphine  (PF) 4 MG/ML injection 4 mg (4 mg Intravenous Not Given 10/08/24 1741)  sodium chloride  0.9 % bolus 1,000 mL (0 mLs Intravenous Stopped 10/08/24 1859)  iohexol  (OMNIPAQUE ) 300 MG/ML solution 125 mL (125 mLs Intravenous Contrast Given 10/08/24 1758)                                    Medical Decision Making Amount and/or Complexity of Data Reviewed Labs: ordered. Radiology: ordered.  Risk Prescription drug management.     Patient presents to the ED for concern of abdominal pain, this involves an extensive number of treatment options, and is a complaint that carries with it a high risk of complications and morbidity.  The differential diagnosis includes GERD, Appendicitis, peptic ulcer, UTI, AAA, enteritis, gastroenteritis.    Co morbidities that complicate the patient evaluation  GERD   Additional history obtained:  Additional history obtained from [facility]   External records from outside source obtained and reviewed including past visits   Lab Tests:  I Ordered, and personally interpreted labs.  The pertinent results include:  CBC, CMP, Urinalysis    Imaging Studies ordered:  I ordered imaging studies including CT  I independently visualized and interpreted imaging which showed pt had enteritis. I agree with the radiologist interpretation    Medicines ordered and prescription drug management:  I ordered medication  including Zofran  and morphine  for abd pain  Reevaluation of the patient after these medicines showed that the patient had refused medication. I have reviewed the patients home medicines and have made adjustments as needed   Test Considered:  None   Critical Interventions:  Morphine  was orders for abdominal pain however pt refused the medication Zofran  was ordered for nausea however pt refused medication   Problem List / ED Course:  Enteritis Uncomplicated urinary tract infection   Reevaluation:  After the interventions noted above, I reevaluated the patient and found that they have :stayed the same   Social Determinants of Health:  None   Dispostion:  After consideration of the diagnostic results and the patients response to treatment, I feel that the patent would benefit from starting ciprofloxacin  for the uncomplicated UTI. This may also help  any bacterial cause for her enteritis.       Final diagnoses:  Periumbilical abdominal pain    ED Discharge Orders          Ordered    ciprofloxacin  (CIPRO ) 500 MG tablet  Every 12 hours        10/08/24 1920               Rosaline Almarie MATSU, [place] 10/08/24 1934    Neysa Caron PARAS, DO 10/08/24 2152

## 2024-10-08 NOTE — ED Notes (Signed)
Patient transported to imaging at this time.

## 2024-10-08 NOTE — ED Notes (Signed)
 New urine sample obtained and sent to lab to run urine culture

## 2024-10-08 NOTE — Discharge Instructions (Signed)
 Follow up with PCP if pain does not completely go away. If pain worsen return to the emergency room.

## 2024-10-08 NOTE — ED Notes (Signed)
 Report received from [place], [place]. Assuming pt care at this time.

## 2024-10-08 NOTE — ED Notes (Signed)
 Lab called stating that the quantity of pt's urine sample wasn't sufficient to run a culture off of. Pt notified of necessity of collecting a new urine sample for this reason.

## 2024-10-08 NOTE — ED Triage Notes (Addendum)
 Reports abdominal pain around the umbilicus since 11/27.Endorses bloating/gassy feeling. Denies N/v. One episode of diarrhea

## 2024-10-09 ENCOUNTER — Other Ambulatory Visit (HOSPITAL_BASED_OUTPATIENT_CLINIC_OR_DEPARTMENT_OTHER): Payer: Self-pay

## 2024-10-09 ENCOUNTER — Encounter (HOSPITAL_BASED_OUTPATIENT_CLINIC_OR_DEPARTMENT_OTHER): Payer: Self-pay

## 2024-10-09 ENCOUNTER — Emergency Department (HOSPITAL_BASED_OUTPATIENT_CLINIC_OR_DEPARTMENT_OTHER)
Admission: EM | Admit: 2024-10-09 | Discharge: 2024-10-09 | Disposition: A | Discharge status: Home / Self Care | Source: Ambulatory Visit | Attending: Emergency Medicine | Admitting: Emergency Medicine

## 2024-10-09 DIAGNOSIS — N390 Urinary tract infection, site not specified: Secondary | ICD-10-CM | POA: Diagnosis not present

## 2024-10-09 DIAGNOSIS — R1033 Periumbilical pain: Secondary | ICD-10-CM | POA: Diagnosis not present

## 2024-10-09 MED ORDER — CIPROFLOXACIN HCL 500 MG PO TABS
500.0000 mg | ORAL_TABLET | Freq: Once | ORAL | Status: AC
  Administered 2024-10-09: 500 mg via ORAL
  Filled 2024-10-09: qty 1

## 2024-10-09 MED ORDER — ONDANSETRON 4 MG PO TBDP
4.0000 mg | ORAL_TABLET | Freq: Once | ORAL | Status: AC
  Administered 2024-10-09: 4 mg via ORAL
  Filled 2024-10-09: qty 1

## 2024-10-09 MED ORDER — OXYCODONE-ACETAMINOPHEN 5-325 MG PO TABS
1.0000 | ORAL_TABLET | Freq: Four times a day (QID) | ORAL | 0 refills | Status: AC | PRN
  Filled 2024-10-09 (×2): qty 15, 4d supply, fill #0

## 2024-10-09 MED ORDER — OXYCODONE-ACETAMINOPHEN 5-325 MG PO TABS
1.0000 | ORAL_TABLET | Freq: Once | ORAL | Status: AC
  Administered 2024-10-09: 1 via ORAL
  Filled 2024-10-09: qty 1

## 2024-10-09 MED ORDER — OXYCODONE-ACETAMINOPHEN 5-325 MG PO TABS: 1.0000 | ORAL_TABLET | Freq: Four times a day (QID) | ORAL | 0 refills | Status: DC | PRN

## 2024-10-09 MED ORDER — ONDANSETRON HCL 4 MG PO TABS: 4.0000 mg | ORAL_TABLET | Freq: Three times a day (TID) | ORAL | 0 refills | Status: DC | PRN

## 2024-10-09 MED ORDER — ONDANSETRON HCL 4 MG PO TABS
4.0000 mg | ORAL_TABLET | Freq: Three times a day (TID) | ORAL | 0 refills | Status: AC | PRN
  Filled 2024-10-09: qty 15, 5d supply, fill #0

## 2024-10-09 NOTE — ED Provider Notes (Signed)
 [facility] Provider Note   CSN: [id number] Arrival date & time: 10/09/24  1054     Patient presents with: Abdominal Pain   [name] is a 73 y.o. female.  With a history of overactive bladder, GERD, diverticulosis, hiatal hernia and recurrent UTIs who presents to the ED for abdominal pain.  Patient was seen here yesterday for periumbilical abdominal pain x 1 week.  She underwent laboratory workup urinalysis and CT abdomen pelvis.  CT abdomen pelvis showed enteritis.  UA suggestive of UTI.  She was started on ciprofloxacin  but has not started this medication yet.  She went to the pharmacy to pick up the medication today but was concerned with increased abdominal pain now localized over the epigastric region.  She did have a small bowel movement today.  No vomiting fevers or chills.    Abdominal Pain      Prior to Admission medications   Medication Sig Start Date End Date Taking? Authorizing Provider  rosuvastatin  (CRESTOR ) 20 MG tablet Take 1 tablet (20 mg total) by mouth daily. 09/20/24   [name], MD  albuterol  (VENTOLIN  HFA) 108 [phone] Base) MCG/ACT inhaler Inhale 2 puffs into the lungs every 6 (six) hours as needed for wheezing or shortness of breath. 10/03/22   [name], MD  Budesonide  (PULMICORT  FLEXHALER) 90 MCG/ACT inhaler Inhale 1 puff into the lungs 2 (two) times daily. 01/04/23   Cobb, Comer GAILS, NP  calcium  carbonate (OSCAL) 1500 (600 Ca) MG TABS tablet Take 600 mg of elemental calcium  by mouth daily.    [provider]  Cholecalciferol (D3 2000 PO) Take by mouth. Patient taking differently: Take 125 mcg by mouth.    [provider]  ciprofloxacin  (CIPRO ) 500 MG tablet Take 1 tablet (500 mg total) by mouth every 12 (twelve) hours. 10/08/24   Rosaline Almarie MATSU, PA-C  ezetimibe  (ZETIA ) 10 MG tablet Take 1 tablet (10 mg total) by mouth daily. 02/05/24   Acharya, Gayatri A, MD  fluticasone  (FLONASE ) 50  MCG/ACT nasal spray SPRAY 2 SPRAYS INTO EACH NOSTRIL EVERY DAY 07/22/23   Cobb, Comer GAILS, NP  gabapentin  (NEURONTIN ) 100 MG capsule TAKE 1 CAPSULE BY MOUTH THREE TIMES A DAY 08/06/24   [name], MD  ketorolac  (ACULAR ) 0.5 % ophthalmic solution Place 1 drop into the right eye 4 (four) times daily. 04/09/24   [provider]  MULTIPLE VITAMIN tablet Take 1 tablet by mouth daily.    [provider]  nabumetone  (RELAFEN ) 750 MG tablet 1 TAB BY MOUTH TWICE PER DAY AS NEEDED 08/26/24   [name], MD  omeprazole (PRILOSEC) 40 MG capsule Take 40 mg by mouth daily.    [provider]  ondansetron  (ZOFRAN ) 4 MG tablet Take 1 tablet (4 mg total) by mouth every 8 (eight) hours as needed for up to 5 days for nausea or vomiting. 10/09/24 10/14/24  Pamella Ozell LABOR, DO  oxyCODONE -acetaminophen  (PERCOCET/ROXICET) 5-325 MG tablet Take 1 tablet by mouth every 6 (six) hours as needed for severe pain (pain score 7-10). 10/09/24   Pamella Ozell LABOR, DO  valACYclovir  (VALTREX ) 1000 MG tablet Take 1 tablet (1,000 mg total) by mouth 3 (three) times daily. 08/19/24   Desiderio Chew, PA-C  vitamin B-12 (CYANOCOBALAMIN ) 1000 MCG tablet Take 5,000 mcg by mouth daily.    [provider]    Allergies: Amoxicillin, Clavulanic acid, and Penicillins    Review of Systems  Gastrointestinal:  Positive for abdominal pain.    Updated Vital Signs BP 122/78 (BP Location: Right Arm)   Pulse 94   Temp 97.9 F (36.6 C)   Resp 16   SpO2 96%   Physical Exam Vitals and nursing note reviewed.  HENT:     Head: Normocephalic and atraumatic.  Eyes:     Pupils: Pupils are equal, round, and reactive to light.  Cardiovascular:     Rate and Rhythm: Normal rate and regular rhythm.  Pulmonary:     Effort: Pulmonary effort is normal.     Breath sounds: Normal breath sounds.  Abdominal:     Palpations: Abdomen is soft.     Tenderness: There is abdominal tenderness in the epigastric area and  periumbilical area. There is no guarding or rebound.  Skin:    General: Skin is warm and dry.  Neurological:     Mental Status: She is alert.  Psychiatric:        Mood and Affect: Mood normal.     (all labs ordered are listed, but only abnormal results are displayed) Labs Reviewed - No data to display  EKG: EKG Interpretation Date/Time:  Wednesday October 09 2024 11:06:37 EST Ventricular Rate:  94 PR Interval:  145 QRS Duration:  93 QT Interval:  390 QTC Calculation: 488 R Axis:   83  Text Interpretation: Sinus rhythm Borderline right axis deviation Low voltage, precordial leads Confirmed by Pamella Sharper [phone]) on 10/09/2024 2:14:16 PM  Radiology: CT ABDOMEN PELVIS W CONTRAST Result Date: 10/08/2024 CLINICAL DATA:  Acute abdominal pain. EXAM: CT ABDOMEN AND PELVIS WITH CONTRAST TECHNIQUE: Multidetector CT imaging of the abdomen and pelvis was performed using the standard protocol following bolus administration of intravenous contrast. RADIATION DOSE REDUCTION: This exam was performed according to the departmental dose-optimization program which includes automated exposure control, adjustment of the mA and/or kV according to patient size and/or use of iterative reconstruction technique. CONTRAST:  125mL OMNIPAQUE  IOHEXOL  300 MG/ML  SOLN COMPARISON:  None Available. FINDINGS: Lower chest: Mild atelectasis in the dependent lung bases. No pleural fluid. Hepatobiliary: Enlarged liver spanning greater than 20 cm in cranial caudal dimension. Diffuse hepatic steatosis. There are multiple low-density lesions throughout the liver, largest represent simple cysts. The smaller lesions are too small to characterize. No further follow-up imaging is recommended. Clips in the gallbladder fossa postcholecystectomy. No biliary dilatation. Pancreas: No ductal dilatation or inflammation. Spleen: Normal in size without focal abnormality. Adrenals/Urinary Tract: No adrenal nodule. No hydronephrosis. No renal  calculi. Homogeneous renal enhancement with symmetric excretion on delayed phase imaging. Urinary bladder is minimally distended. No bladder wall thickening. High-density material within the anterior base of the bladder may be postoperative, patient with history of cystocele repair. Stomach/Bowel: The stomach is nondistended. Scattered fluid within large and small bowel. Mid small bowel is borderline dilated, greatest small bowel distension of 2.8 cm. There is general transition from fluid-filled to nondilated small bowel centrally, series 8, image 65, where there is an area of small bowel wall thickening. Fluid throughout the colon which is redundant. Scattered colonic diverticula. No diverticulitis or colonic wall thickening. No mesenteric edema or perienteric inflammation. The appendix is normal. Vascular/Lymphatic: Aortic atherosclerosis. No aortic aneurysm. Patent portal, splenic and mesenteric veins. No lymphadenopathy. Reproductive: Status post hysterectomy. No adnexal masses. Other: No free air, free fluid, or intra-abdominal fluid collection. Diminutive fat containing umbilical hernia. Musculoskeletal: There are no acute or suspicious osseous abnormalities. Degenerative change in the spine, greatest at L4-L5. IMPRESSION: 1. Fluid-filled large  and small bowel, with borderline dilatation of mid small bowel. There is general transition from fluid-filled to nondilated small bowel centrally, where there is an area of small bowel wall thickening. Findings are suggestive of generalized enteritis. 2. Colonic diverticulosis without diverticulitis. 3. Hepatomegaly and hepatic steatosis. Aortic Atherosclerosis (ICD10-I70.0). Electronically Signed   By: Andrea Gasman M.D.   On: 10/08/2024 18:41     Procedures   Medications Ordered in the ED  oxyCODONE -acetaminophen  (PERCOCET/ROXICET) 5-325 MG per tablet 1 tablet (1 tablet Oral Given 10/09/24 1151)  ciprofloxacin  (CIPRO ) tablet 500 mg (500 mg Oral Given  10/09/24 1258)    Clinical Course as of 10/09/24 1448  Wed Oct 09, 2024  1447 Patient reports improvement in pain discomfort after both Percocet Cipro  here and 1 episode of vomiting.  We discussed rationale for repeat CT versus discharge with close follow-up and return precautions.  At this time she feels better and wants to go home.  Will discharge with Zofran  and short course of Percocet for severe pain at home  [MP]    Clinical Course User Index [MP] Pamella Ozell LABOR, DO                                 Medical Decision Making 73 year old female with history as above was seen here yesterday for abdominal pain.  Diagnosed with UTI.  CT abdomen pelvis showed enteritis yesterday.  Now back with increasing abdominal pain.  Did not start antibiotics yet.  Afebrile normotensive well-appearing overall but still having some periumbilical and epigastric tenderness.  Low suspicion for acute intra-abdominal process considering CT findings from less than 24 hours ago.  Will give her first dose of antibiotics here and opioid analgesic p.o. and reassess.  Risk Prescription drug management.        Final diagnoses:  Periumbilical abdominal pain  Lower urinary tract infectious disease    ED Discharge Orders          Ordered    ondansetron  (ZOFRAN ) 4 MG tablet  Every 8 hours PRN,   Status:  Discontinued        10/09/24 1444    ondansetron  (ZOFRAN ) 4 MG tablet  Every 8 hours PRN        10/09/24 1447    oxyCODONE -acetaminophen  (PERCOCET/ROXICET) 5-325 MG tablet  Every 6 hours PRN,   Status:  Discontinued        10/09/24 1447    oxyCODONE -acetaminophen  (PERCOCET/ROXICET) 5-325 MG tablet  Every 6 hours PRN        10/09/24 1447               Pamella Ozell LABOR, DO 10/09/24 1449

## 2024-10-09 NOTE — Discharge Instructions (Signed)
 You were seen in the emerged from for abdominal pain Your abdominal pain improved after dose of pain medicine and Zofran  here We have called in a prescription for Zofran  for you to pick up from your pharmacy as well as a few days worth of Percocet to help with severe pain only Take Tylenol  as directed for mild to moderate pain Use Percocet only for severe pain Do not drink alcohol  or drive while taking Percocet Return to the emergency room for severe pain or any other concerns

## 2024-10-09 NOTE — ED Triage Notes (Signed)
 Reports continued mid abd pain for 1 week. States pain moved to epigastric area since last night.  Denies NVD, urinary symptoms.

## 2024-10-10 LAB — URINE CULTURE: Culture: >=100000 — AB

## 2024-10-11 ENCOUNTER — Telehealth (HOSPITAL_BASED_OUTPATIENT_CLINIC_OR_DEPARTMENT_OTHER): Payer: Self-pay | Admitting: *Deleted

## 2024-10-11 NOTE — Telephone Encounter (Signed)
 Post ED Visit - Positive Culture Follow-up: Successful Patient Follow-Up  Culture assessed and recommendations reviewed by:  []  Rankin Dee, Pharm.D. []  Venetia Gully, Pharm.D., BCPS AQ-ID []  Garrel Crews, Pharm.D., BCPS []  Almarie Lunger, Pharm.D., BCPS []  [place], [address].D., BCPS, AAHIVP []  Rosaline Bihari, Pharm.D., BCPS, AAHIVP []  Vernell Meier, PharmD, BCPS []  Latanya Hint, PharmD, BCPS []  Donald Medley, PharmD, BCPS [x]  Powell Blush, PharmD  Positive urine culture  []  Patient discharged without antimicrobial prescription and treatment is now indicated [x]  Organism is resistant to prescribed ED discharge antimicrobial []  Patient with positive blood cultures  Changes discussed with ED provider: Marry Kidney, PA New antibiotic prescription Macrobid  100mg  BID x 5d Stop Cipro  Called to [facility] [address] [place], [place]  Contacted patient, date 10/11/24, time 1044   [name] 10/11/2024, 10:46 AM

## 2024-10-11 NOTE — Progress Notes (Signed)
 ED Antimicrobial Stewardship Positive Culture Follow Up   [name] is an 73 y.o. female who presented to [facility] with a chief complaint of  Chief Complaint  Patient presents with   Abdominal Pain    Recent Results (from the past 720 hours)  Urine Culture     Status: Abnormal   Collection Time: 10/08/24  2:08 PM   Specimen: Urine, Clean Catch  Result Value Ref Range Status   Specimen Description   Final    URINE, CLEAN CATCH Performed at [facility], [address] [place], [place] [address]    Special Requests   Final    NONE Performed at [facility], [address] [place], [place] [address]    Culture >=100,000 COLONIES/mL ESCHERICHIA COLI (A)  Final   Report Status 10/10/2024 FINAL  Final   Organism ID, Bacteria ESCHERICHIA COLI (A)  Final      Susceptibility   Escherichia coli - MIC*    AMPICILLIN >=32 RESISTANT Resistant     CEFAZOLIN  (URINE) Value in next row Sensitive      8 SENSITIVEThis is a modified FDA-approved test that has been validated and its performance characteristics determined by the reporting laboratory.  This laboratory is certified under the Clinical Laboratory Improvement Amendments CLIA as qualified to perform high complexity clinical laboratory testing.    CEFEPIME Value in next row Sensitive      8 SENSITIVEThis is a modified FDA-approved test that has been validated and its performance characteristics determined by the reporting laboratory.  This laboratory is certified under the Clinical Laboratory Improvement Amendments CLIA as qualified to perform high complexity clinical laboratory testing.    ERTAPENEM Value in next row Sensitive      8 SENSITIVEThis is a modified FDA-approved test that has been validated and its performance characteristics determined by the reporting laboratory.  This laboratory is certified under the Clinical Laboratory Improvement Amendments CLIA as qualified to perform high complexity clinical  laboratory testing.    CEFTRIAXONE Value in next row Sensitive      8 SENSITIVEThis is a modified FDA-approved test that has been validated and its performance characteristics determined by the reporting laboratory.  This laboratory is certified under the Clinical Laboratory Improvement Amendments CLIA as qualified to perform high complexity clinical laboratory testing.    CIPROFLOXACIN  Value in next row Intermediate      8 SENSITIVEThis is a modified FDA-approved test that has been validated and its performance characteristics determined by the reporting laboratory.  This laboratory is certified under the Clinical Laboratory Improvement Amendments CLIA as qualified to perform high complexity clinical laboratory testing.    GENTAMICIN Value in next row Resistant      8 SENSITIVEThis is a modified FDA-approved test that has been validated and its performance characteristics determined by the reporting laboratory.  This laboratory is certified under the Clinical Laboratory Improvement Amendments CLIA as qualified to perform high complexity clinical laboratory testing.    NITROFURANTOIN  Value in next row Sensitive      8 SENSITIVEThis is a modified FDA-approved test that has been validated and its performance characteristics determined by the reporting laboratory.  This laboratory is certified under the Clinical Laboratory Improvement Amendments CLIA as qualified to perform high complexity clinical laboratory testing.    TRIMETH /SULFA  Value in next row Resistant      8 SENSITIVEThis is a modified FDA-approved test that has been validated and its performance characteristics determined by the reporting laboratory.  This laboratory is certified under the Clinical Laboratory Improvement Amendments CLIA as qualified to perform high complexity clinical laboratory testing.    AMPICILLIN/SULBACTAM Value in next row Intermediate      8 SENSITIVEThis is a modified FDA-approved test that has been validated and its  performance characteristics determined by the reporting laboratory.  This laboratory is certified under the Clinical Laboratory Improvement Amendments CLIA as qualified to perform high complexity clinical laboratory testing.    PIP/TAZO Value in next row Sensitive      <=4 SENSITIVEThis is a modified FDA-approved test that has been validated and its performance characteristics determined by the reporting laboratory.  This laboratory is certified under the Clinical Laboratory Improvement Amendments CLIA as qualified to perform high complexity clinical laboratory testing.    MEROPENEM Value in next row Sensitive      <=4 SENSITIVEThis is a modified FDA-approved test that has been validated and its performance characteristics determined by the reporting laboratory.  This laboratory is certified under the Clinical Laboratory Improvement Amendments CLIA as qualified to perform high complexity clinical laboratory testing.    * >=100,000 COLONIES/mL ESCHERICHIA COLI    Treated with cipro , organism resistant to prescribed antimicrobial  New antibiotic prescription: Macrobid  100mg  BID x 5 days.   ED Provider: Marry Kidney, PA-C    Powell Blush, PharmD, BCCCP  10/11/2024, 10:02 AM Clinical Pharmacist Monday - Friday phone -  [phone] Saturday - Sunday phone - [phone]

## 2024-10-14 ENCOUNTER — Ambulatory Visit: Admitting: Neurology

## 2024-10-14 ENCOUNTER — Encounter: Payer: Self-pay | Admitting: Neurology

## 2024-10-14 VITALS — BP 139/71 | HR 97 | Ht 67.0 in | Wt 231.0 lb

## 2024-10-14 DIAGNOSIS — G629 Polyneuropathy, unspecified: Secondary | ICD-10-CM | POA: Diagnosis not present

## 2024-10-14 NOTE — Progress Notes (Signed)
 Follow-up Visit   Date: 10/14/2024    [name] MRN: [id number] DOB: 04/12/1951    [name] is a 73 y.o. right-handed Caucasian female with GERD, cervical cancer, hyperlipidemia, and GERD returning to the clinic for follow-up of idiopathic neuropathy.  The patient was accompanied to the clinic by self.  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 Symptoms managed with gabapentin . Macrobid  use unlikely cause. Discussed lack of effective cures and prevalence of unproven treatments. - Continue gabapentin  100 mg at bedtime. - Advised caution on uneven surfaces and to wear shoes to prevent falls. - Encouraged daily foot checks for injuries.  Return to clinic in 1 year  --------------------------------------------- History of present illness: She reports having numbness involving both hands which is worse on the left, and progressively intensified over the past several months.  She denies weakness in the hands.    She has neuropathy involving the lower legs and feet for the past several years, which is getting worse.  She has some imbalance and walks unassisted.  No weakness.  She takes gabapentin  100mg  in the morning and 200mg  at bedtime.   She is retired from working for [employer]  She lives with husband.  Nonsmoker. Occasionally drinks wine.  She had cervical cancer which only requested surgery.  No history of chemotherapy or radiation.    UPDATE 10/14/2024:  Discussed the use of AI scribe software for clinical note transcription with the patient, who gave verbal consent to proceed.  History of Present Illness Her neuropathy is characterized by tingling in her fingers and a sensation extending up to her knees. She experiences no significant pain and takes gabapentin  100mg  at bedtime. She notes a reduction in sharp pains and describes the sensation as 'kind of tingly' but not numb.  Last week, she visited the emergency  room twice due to gastrointestinal issues and was initially prescribed Cipro  for inflammation and a suspected UTI, which was later changed to Macrobid . She is concerned about Macrobid 's potential to cause neuropathy, although she has used it in the past without issues.   She experiences some unsteadiness in the mornings and requires a few seconds to gain her balance. She does not use a cane but has grab bars in her bathroom for support.  She has night lights in the hallway and bathrooms to aid with balance issues related to her neuropathy. She is cautious on uneven surfaces and avoids walking on dry sand without shoes, as it exacerbates her balance issues.   Medications:  Current Outpatient Medications on File Prior to Visit  Medication Sig Dispense Refill   calcium  carbonate (OSCAL) 1500 (600 Ca) MG TABS tablet Take 600 mg of elemental calcium  by mouth daily.     Cholecalciferol (D3 2000 PO) Take by mouth.     ezetimibe  (ZETIA ) 10 MG tablet Take 1 tablet (10 mg total) by mouth daily. 90 tablet 2   gabapentin  (NEURONTIN ) 100 MG capsule TAKE 1 CAPSULE BY MOUTH THREE TIMES A DAY (Patient taking differently: Take 100 mg by mouth 2 (two) times daily.) 90 capsule 3   MULTIPLE VITAMIN tablet Take 1 tablet by mouth daily.     nabumetone  (RELAFEN ) 750 MG tablet 1 TAB BY MOUTH TWICE PER DAY AS NEEDED 180 tablet 2   omeprazole (PRILOSEC) 40 MG capsule Take 40 mg by mouth daily.     ondansetron  (ZOFRAN ) 4 MG tablet Take 1 tablet (4 mg total) by mouth every 8 (eight)  hours as needed for up to 5 days for nausea or vomiting. 15 tablet 0   oxyCODONE -acetaminophen  (PERCOCET/ROXICET) 5-325 MG tablet Take 1 tablet by mouth every 6 (six) hours as needed for severe pain (pain score 7-10). 15 tablet 0   rosuvastatin  (CRESTOR ) 20 MG tablet Take 1 tablet (20 mg total) by mouth daily. 90 tablet 3   vitamin B-12 (CYANOCOBALAMIN ) 1000 MCG tablet Take 5,000 mcg by mouth daily.     No current facility-administered  medications on file prior to visit.    Allergies:  Allergies  Allergen Reactions   Amoxicillin     REACTION: unspecified   Clavulanic Acid    Penicillins Other (See Comments)    Unknown reaction, mother had reaction almost died    Vital Signs:  BP 139/71   Pulse 97   Ht 5' 7 (1.702 m)   Wt 231 lb (104.8 kg)   SpO2 95%   BMI 36.18 kg/m   Neurological Exam: MENTAL STATUS including orientation to time, place, person, recent and remote memory, attention span and concentration, language, and fund of knowledge is normal.  Speech is not dysarthric.  CRANIAL NERVES:   Pupils equal round and reactive to light.  Normal conjugate, extra-ocular eye movements in all directions of gaze.  No ptosis.  Face is symmetric.  MOTOR:  Motor strength is 5/5 in all extremities.  No atrophy, fasciculations or abnormal movements.  No pronator drift.  Tone is normal.    MSRs:                                           Right        Left brachioradialis 2+  2+  biceps 2+  2+  triceps 2+  2+  patellar 1+  1+  ankle jerk 0  0  Hoffman no  no   SENSORY:  Vibration reduced at the great toe bilaterally, intact above the knees.  COORDINATION/GAIT:  Normal finger-to- nose-finger.  Intact rapid alternating movements bilaterally.  Gait narrow based and stable. Unsteady with tandem gait.   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 Chronic C6 radiculopathy affecting the left upper extremity.   MRI cervical spine wo contrast 05/06/2024: No change noted since the study of January. 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   Thank you for allowing me to participate in patient's care.  If I can answer any additional questions, I would be pleased to do so.    Sincerely,    Belen Zwahlen K. Tobie,  DO

## 2024-10-15 ENCOUNTER — Encounter: Payer: Self-pay | Admitting: *Deleted

## 2024-10-17 ENCOUNTER — Ambulatory Visit: Attending: Internal Medicine | Admitting: Internal Medicine

## 2024-10-17 VITALS — BP 114/77 | HR 93 | Ht 67.0 in | Wt 227.2 lb

## 2024-10-17 DIAGNOSIS — I2510 Atherosclerotic heart disease of native coronary artery without angina pectoris: Secondary | ICD-10-CM | POA: Insufficient documentation

## 2024-10-17 DIAGNOSIS — R079 Chest pain, unspecified: Secondary | ICD-10-CM | POA: Diagnosis present

## 2024-10-17 DIAGNOSIS — R9431 Abnormal electrocardiogram [ECG] [EKG]: Secondary | ICD-10-CM | POA: Insufficient documentation

## 2024-10-17 DIAGNOSIS — G4733 Obstructive sleep apnea (adult) (pediatric): Secondary | ICD-10-CM | POA: Diagnosis present

## 2024-10-17 DIAGNOSIS — E785 Hyperlipidemia, unspecified: Secondary | ICD-10-CM | POA: Diagnosis not present

## 2024-10-17 DIAGNOSIS — I700 Atherosclerosis of aorta: Secondary | ICD-10-CM | POA: Diagnosis present

## 2024-10-17 DIAGNOSIS — R030 Elevated blood-pressure reading, without diagnosis of hypertension: Secondary | ICD-10-CM | POA: Insufficient documentation

## 2024-10-17 NOTE — Progress Notes (Signed)
 Cardiology Office Note:  .   Date:  10/17/2024  ID:  [name], DOB 03-Aug-1951, MRN [id number] PCP: [name], MD  [facility] Providers Cardiologist:  Soyla DELENA Merck, MD    History of Present Illness: [name] is a 73 y.o. female.  Discussed the use of AI scribe software for clinical note transcription with the patient, who gave verbal consent to proceed.  History of Present Illness [name] is a 73 year old female with coronary artery disease who presents for follow-up of chest discomfort and hyperlipidemia.  She has mild coronary artery disease and was previously evaluated for chest tightness. Her blood pressure was elevated last week in the setting of pain.  Her hyperlipidemia is treated with rosuvastatin  10 mg daily and Zetia  10 mg daily. A lipid panel in November showed LDL 92, triglycerides 138, and HDL 48. Her LDL had been 61 on prior testing. She has not yet started the planned increase of rosuvastatin  to 20 mg.  She recently had abdominal pain above the umbilicus with vomiting and diarrhea. CT showed small bowel thickening. Symptoms have resolved. She completed a course of antibiotics, initially Cipro  then Macrobid , for a suspected urinary infection.  She has peripheral neuropathy treated with gabapentin  and arthritis with occasional fatigue, which she attributes to her arthritis.    ROS: negative except per HPI above.  Studies Reviewed: .        Results LABS Lipid Panel: LDL 92, Triglycerides 138, HDL 48 (09/2024) White Blood Cell Count: Elevated  RADIOLOGY Abdominal CT: Small bowel thickening Risk Assessment/Calculations:       Physical Exam:   VS:  BP 114/77 (BP Location: Left Arm, Patient Position: Sitting)   Pulse 93   Ht 5' 7 (1.702 m)   Wt 227 lb 3.2 oz (103.1 kg)   SpO2 95%   BMI 35.58 kg/m    Wt Readings from Last 3 Encounters:  10/17/24 227 lb 3.2 oz (103.1 kg)  10/14/24 231 lb (104.8 kg)  10/08/24 233 lb 0.4 oz  (105.7 kg)     Physical Exam GENERAL: Alert, cooperative, well developed, no acute distress HEENT: Normocephalic, normal oropharynx, moist mucous membranes CHEST: Clear to auscultation bilaterally, no wheezes, rhonchi, or crackles CARDIOVASCULAR: Normal heart rate and rhythm, S1 and S2 normal without murmurs ABDOMEN: Soft, non-tender, non-distended, without organomegaly, normal bowel sounds EXTREMITIES: No cyanosis or edema NEUROLOGICAL: Cranial nerves grossly intact, moves all extremities without gross motor or sensory deficit   ASSESSMENT AND PLAN: .    Assessment and Plan Assessment & Plan Coronary artery disease Mild coronary artery disease with previous chest tightness. No recent chest pain. Previous EKG and testing were reassuring. Blood pressure is well-controlled. - Continue current management without changes. - Advised to call if chest pain recurs. - crestor  20 mg daily to start now and zetia  10 mg daily, continue.   Hyperlipidemia Well-controlled on rosuvastatin  10 mg and Zetia  10 mg daily. Recent lipid panel shows LDL 92, triglycerides 138, HDL 48. LDL increased from previous 61. Primary care physician increased rosuvastatin  to 20 mg to lower LDL. Discussed potential for increased hemoglobin A1c with higher statin dose. She agrees to try increased dose. - Increase rosuvastatin  to 20 mg daily. - Monitor for muscle aches or increased blood sugar levels. - If muscle aches occur, reduce rosuvastatin  back to 10 mg. - Continue Zetia  10 mg daily.  Elevated blood pressure without diagnosis of hypertension Previously elevated blood pressure without  hypertension diagnosis. Blood pressure currently well-controlled. No need for medication at this time. - Continue home blood pressure monitoring. - Will consider amlodipine 2.5 mg daily if blood pressure increases.       Soyla Merck, MD, FACC

## 2024-10-17 NOTE — Patient Instructions (Signed)
 Medication Instructions:  No Changes *If you need a refill on your cardiac medications before your next appointment, please call your pharmacy*  Lab Work: None  Follow-Up: At [facility], you and your health needs are our priority.  As part of our continuing mission to provide you with exceptional heart care, our providers are all part of one team.  This team includes your primary Cardiologist (physician) and Advanced Practice Providers or APPs (Physician Assistants and Nurse Practitioners) who all work together to provide you with the care you need, when you need it.  Your next appointment:   1 year(s) (We will mail a reminder letter around October 2026; please call for a December 2026 appointment)  Provider:   Gayatri A Acharya, MD   Other Instructions Please call us  or send a MyChart message with any Cardiology related questions/concerns.  [phone].  Thank you!

## 2024-10-26 ENCOUNTER — Telehealth: Payer: Self-pay | Admitting: Internal Medicine

## 2024-11-06 NOTE — Telephone Encounter (Signed)
" °*  STAT* If patient is at the pharmacy, call can be transferred to refill team.   1. Which medications need to be refilled? (please list name of each medication and dose if known)   ezetimibe  (ZETIA ) 10 MG tablet   2. Would you like to learn more about the convenience, safety, & potential cost savings by using the [facility]?   3. Are you open to using the [facility] (Type [facility]. ).  4. Which pharmacy/location (including street and city if local pharmacy) is medication to be sent to?  [id number] [place], [place] - [address]   5. Do they need a 30 day or 90 day supply?   90 day  Patient stated she has 1 week's worth of medication.    "

## 2024-11-12 ENCOUNTER — Ambulatory Visit

## 2024-11-12 ENCOUNTER — Ambulatory Visit (INDEPENDENT_AMBULATORY_CARE_PROVIDER_SITE_OTHER)

## 2024-11-12 VITALS — Ht 67.0 in | Wt 225.0 lb

## 2024-11-12 DIAGNOSIS — Z0000 Encounter for general adult medical examination without abnormal findings: Secondary | ICD-10-CM | POA: Diagnosis not present

## 2024-11-12 NOTE — Patient Instructions (Signed)
 [name],  Thank you for taking the time for your Medicare Wellness Visit. I appreciate your continued commitment to your health goals. Please review the care plan we discussed, and feel free to reach out if I can assist you further.  Please note that Annual Wellness Visits do not include a physical exam. Some assessments may be limited, especially if the visit was conducted virtually. If needed, we may recommend an in-person follow-up with your provider.  Ongoing Care Seeing your primary care provider every 3 to 6 months helps us  monitor your health and provide consistent, personalized care.   Referrals If a referral was made during today's visit and you haven't received any updates within two weeks, please contact the referred provider directly to check on the status.  Recommended Screenings:  Health Maintenance  Topic Date Due   Zoster (Shingles) Vaccine (1 of 2) Never done   DTaP/Tdap/Td vaccine (3 - Td or Tdap) 03/20/2024   COVID-19 Vaccine (4 - 2025-26 season) 07/08/2024   Medicare Annual Wellness Visit  09/24/2024   Breast Cancer Screening  08/08/2026   Colon Cancer Screening  07/12/2027   Pneumococcal Vaccine for age over 37  Completed   Flu Shot  Completed   Osteoporosis screening with Bone Density Scan  Completed   Hepatitis C Screening  Completed   Meningitis B Vaccine  Aged Out       11/12/2024    1:53 PM  Advanced Directives  Does Patient Have a Medical Advance Directive? Yes  Type of Estate Agent of [place];Living will  Does patient want to make changes to medical advance directive? No - Patient declined  Copy of Healthcare Power of Attorney in Chart? No - copy requested    Vision: Annual vision screenings are recommended for early detection of glaucoma, cataracts, and diabetic retinopathy. These exams can also reveal signs of chronic conditions such as diabetes and high blood pressure.  Dental: Annual dental screenings help detect early  signs of oral cancer, gum disease, and other conditions linked to overall health, including heart disease and diabetes.  Please see the attached documents for additional preventive care recommendations.

## 2024-11-12 NOTE — Progress Notes (Signed)
 "  Chief Complaint  Patient presents with   Medicare Wellness     Subjective:   [name] is a 74 y.o. female who presents for a Medicare Annual Wellness Visit.  Visit info / Clinical Intake: Medicare Wellness Visit Type:: Subsequent Annual Wellness Visit Persons participating in visit and providing information:: patient Medicare Wellness Visit Mode:: Telephone If telephone:: video error Since this visit was completed virtually, some vitals may be partially provided or unavailable. Missing vitals are due to the limitations of the virtual format.: Documented vitals are patient reported If Telephone or Video please confirm:: I connected with patient using audio/video enable telemedicine. I verified patient identity with two identifiers, discussed telehealth limitations, and patient agreed to proceed. Patient Location:: home Provider Location:: home office Interpreter Needed?: No Pre-visit prep was completed: yes AWV questionnaire completed by patient prior to visit?: no Living arrangements:: lives with spouse/significant other Patient's Overall Health Status Rating: very good Typical amount of pain: some Does pain affect daily life?: (!) yes (sometimes) Are you currently prescribed opioids?: no  Dietary Habits and Nutritional Risks How many meals a day?: 2 Eats fruit and vegetables daily?: yes Most meals are obtained by: preparing own meals; eating out In the last 2 weeks, have you had any of the following?: none Diabetic:: no  Functional Status Activities of Daily Living (to include ambulation/medication): Independent Ambulation: Independent Medication Administration: Independent Home Management (perform basic housework or laundry): Independent Manage your own finances?: yes Primary transportation is: driving Concerns about vision?: no *vision screening is required for WTM*  Fall Screening Falls in the past year?: 0 Number of falls in past year: 0 Was there an injury  with Fall?: 0 Fall Risk Category Calculator: 0 Patient Fall Risk Level: Low Fall Risk  Fall Risk Patient at Risk for Falls Due to: Medication side effect Fall risk Follow up: Falls prevention discussed; Falls evaluation completed  Home and Transportation Safety: All rugs have non-skid backing?: yes All stairs or steps have railings?: yes Grab bars in the bathtub or shower?: yes Have non-skid surface in bathtub or shower?: yes Good home lighting?: yes Regular seat belt use?: yes Hospital stays in the last year:: no  Cognitive Assessment Difficulty concentrating, remembering, or making decisions? : no Will 6CIT or Mini Cog be Completed: yes What year is it?: 0 points What month is it?: 0 points Give patient an address phrase to remember (5 components): [address] [place] About what time is it?: 0 points Count backwards from 20 to 1: 0 points Say the months of the year in reverse: 0 points Repeat the address phrase from earlier: 0 points 6 CIT Score: 0 points  Advance Directives (For Healthcare) Does Patient Have a Medical Advance Directive?: Yes Does patient want to make changes to medical advance directive?: No - Patient declined Type of Advance Directive: Healthcare Power of [place]; Living will Copy of Healthcare Power of Attorney in Chart?: No - copy requested Copy of Living Will in Chart?: No - copy requested  Reviewed/Updated  Reviewed/Updated: Reviewed All (Medical, Surgical, Family, Medications, Allergies, Care Teams, Patient Goals)    Allergies (verified) Amoxicillin, Clavulanic acid, and Penicillins   Current Medications (verified) Outpatient Encounter Medications as of 11/12/2024  Medication Sig   calcium  carbonate (OSCAL) 1500 (600 Ca) MG TABS tablet Take 600 mg of elemental calcium  by mouth daily.   Cholecalciferol (D3 2000 PO) Take by mouth.   ezetimibe  (ZETIA ) 10 MG tablet TAKE 1 TABLET BY MOUTH EVERY DAY  gabapentin  (NEURONTIN ) 100 MG capsule  TAKE 1 CAPSULE BY MOUTH THREE TIMES A DAY (Patient taking differently: Take 100 mg by mouth 2 (two) times daily.)   MULTIPLE VITAMIN tablet Take 1 tablet by mouth daily.   nabumetone  (RELAFEN ) 750 MG tablet 1 TAB BY MOUTH TWICE PER DAY AS NEEDED   omeprazole (PRILOSEC) 40 MG capsule Take 40 mg by mouth daily.   rosuvastatin  (CRESTOR ) 20 MG tablet Take 1 tablet (20 mg total) by mouth daily.   vitamin B-12 (CYANOCOBALAMIN ) 1000 MCG tablet Take 5,000 mcg by mouth daily.   oxyCODONE -acetaminophen  (PERCOCET/ROXICET) 5-325 MG tablet Take 1 tablet by mouth every 6 (six) hours as needed for severe pain (pain score 7-10).   No facility-administered encounter medications on file as of 11/12/2024.    History: Past Medical History:  Diagnosis Date   Abnormal Pap smear of vagina 06/17/15   LGSIL   Allergic rhinitis, cause unspecified    Arthritis    ARTHRITIS, KNEE 02/10/2009   Qualifier: Diagnosis of  By: Viviann Raddle MD, Willie R    BRCA negative 03/25/13   Cervical cancer (HCC) 1984   Cervical disc disease 02/11/2012   Chronic lower back pain    Degenerative arthritis of hip 02/11/2012   Diverticulosis 02/11/2012   GERD 08/10/2007   Qualifier: Diagnosis of  By: Delos, CMA, Cindy     GERD (gastroesophageal reflux disease)    GERD (gastroesophageal reflux disease)    Hemorrhoids    HEMORRHOIDS-INTERNAL 07/21/2010   Qualifier: Diagnosis of  By: Kerman NP, Paula     Hiatal hernia    Hyperlipidemia 02/13/2012   Mitral valve prolapse    no cardiologist, mild   OAB (overactive bladder)    Obesity    OVERACTIVE BLADDER 08/10/2007   Qualifier: Diagnosis of  By: Delos, CMA, Cindy     Pneumonia 2017   PONV (postoperative nausea and vomiting)    yrs ago unable to raise arms after female surgery, no recent problems   Recurrent UTI    Sleep apnea    uses cpap set on 2   SUI (stress urinary incontinence, female)    Urinary incontinence    VAGINITIS, ATROPHIC 11/11/2008   Qualifier: Diagnosis of  By:  Viviann Raddle MD, Marsha SAUNDERS    Past Surgical History:  Procedure Laterality Date   bladder tack     x 3 or 4   BUNIONECTOMY Right 2007   CERVICAL DISCECTOMY  2002   fusion and plate C 3   CHOLECYSTECTOMY N/A 08/13/2013   Procedure: LAPAROSCOPIC CHOLECYSTECTOMY WITH INTRAOPERATIVE CHOLANGIOGRAM;  Surgeon: Alm VEAR Angle, MD;  Location: [facility] OR;  Service: General;  Laterality: N/A;   CLOSED REDUCTION METATARSAL FRACTURE     rt side 06-03-18, left side 08-19-17   COLONOSCOPY  2018   conization of cervix  1982   with D&C   CYSTOCELE REPAIR  2003   rectocele repair   EYE SURGERY Bilateral yrs ago   lasik   ORIF ANKLE FRACTURE Right 08/20/2017   Procedure: OPEN REDUCTION INTERNAL FIXATION (ORIF) ANKLE FRACTURE;  Surgeon: Addie Cordella Hamilton, MD;  Location: [facility];  Service: Orthopedics;  Laterality: Right;   SHOULDER ARTHROSCOPY Right 2004   frozen shoulder   TONSILLECTOMY  as child   adenoids removed   TRANSANAL HEMORRHOIDAL DEARTERIALIZATION N/A 07/30/2020   Procedure: TRANSANAL HEMORRHOIDAL DEARTERIALIZATION;  Surgeon: Debby Hila, MD;  Location: [place];  Service: General;  Laterality: N/A;   VAGINAL HYSTERECTOMY  1986  partial   Family History  Problem Relation Age of Onset   Breast cancer Mother        dx in her last 87's   Heart disease Sister    Breast cancer Sister        dx in her late 45's   Diabetes Brother    Heart disease Brother        triple bypass   Diabetes Maternal Aunt    Diabetes Maternal Uncle    Cancer Maternal Grandmother        uterine   Breast cancer Cousin    Thyroid  cancer Other        2 cousins   Colon cancer Neg Hx    Esophageal cancer Neg Hx    Liver cancer Neg Hx    Pancreatic cancer Neg Hx    Rectal cancer Neg Hx    Stomach cancer Neg Hx    Social History   Occupational History   Occupation: Retired    Associate Professor: [employer]  Tobacco Use   Smoking status: Never    Passive exposure: Past   Smokeless tobacco: Never   Vaping Use   Vaping status: Never Used  Substance and Sexual Activity   Alcohol  use: Not Currently    Comment: occ wine   Drug use: No   Sexual activity: Yes    Partners: Male    Birth control/protection: Post-menopausal    Comment: hysterectomy   Tobacco Counseling Counseling given: Not Answered  SDOH Screenings   Food Insecurity: No Food Insecurity (11/12/2024)  Housing: Unknown (11/12/2024)  Transportation Needs: No Transportation Needs (11/12/2024)  Utilities: Not At Risk (11/12/2024)  Alcohol  Screen: Low Risk (11/12/2024)  Depression (PHQ2-9): Low Risk (11/12/2024)  Financial Resource Strain: Low Risk (11/12/2024)  Physical Activity: Inactive (11/12/2024)  Social Connections: Moderately Integrated (11/12/2024)  Stress: No Stress Concern Present (11/12/2024)  Tobacco Use: Low Risk (11/12/2024)  Health Literacy: Adequate Health Literacy (11/12/2024)   See flowsheets for full screening details  Depression Screen PHQ 2 & 9 Depression Scale- Over the past 2 weeks, how often have you been bothered by any of the following problems? Little interest or pleasure in doing things: 0 Feeling down, depressed, or hopeless (PHQ Adolescent also includes...irritable): 0 PHQ-2 Total Score: 0 Trouble falling or staying asleep, or sleeping too much: 0 Feeling tired or having little energy: 0 Poor appetite or overeating (PHQ Adolescent also includes...weight loss): 0 Feeling bad about yourself - or that you are a failure or have let yourself or your family down: 0 Trouble concentrating on things, such as reading the newspaper or watching television (PHQ Adolescent also includes...like school work): 0 Moving or speaking so slowly that other people could have noticed. Or the opposite - being so fidgety or restless that you have been moving around a lot more than usual: 0 Thoughts that you would be better off dead, or of hurting yourself in some way: 0 PHQ-9 Total Score: 0 If you checked off any problems, how  difficult have these problems made it for you to do your work, take care of things at home, or get along with other people?: Not difficult at all  Depression Treatment Depression Interventions/Treatment : EYV7-0 Score <4 Follow-up Not Indicated     Goals Addressed             This Visit's Progress    Patient Stated       11/12/2024, wants to start exercising regularly  Objective:    Today's Vitals   11/12/24 1348  Weight: 225 lb (102.1 kg)  Height: 5' 7 (1.702 m)   Body mass index is 35.24 kg/m.  Hearing/Vision screen Hearing Screening - Comments:: Denies hearing issues Vision Screening - Comments:: Regular eye exams Immunizations and Health Maintenance Health Maintenance  Topic Date Due   Zoster Vaccines- Shingrix (1 of 2) Never done   DTaP/Tdap/Td (3 - Td or Tdap) 03/20/2024   COVID-19 Vaccine (4 - 2025-26 season) 07/08/2024   Medicare Annual Wellness (AWV)  11/12/2025   Mammogram  08/08/2026   Colonoscopy  07/12/2027   Pneumococcal Vaccine: 50+ Years  Completed   Influenza Vaccine  Completed   Bone Density Scan  Completed   Hepatitis C Screening  Completed   Meningococcal B Vaccine  Aged Out        Assessment/Plan:  This is a routine wellness examination for [facility].  Patient Care Team: [name], MD as PCP - General (Internal Medicine) Loni Soyla LABOR, MD as PCP - Cardiology (Cardiology) Loni Soyla LABOR, MD as Consulting Physician (Cardiology) Louis Shove, MD as Consulting Physician (Neurosurgery) Tobie Tonita POUR, DO as Consulting Physician (Neurology) Cleotilde Sewer, OD (Optometry)  I have personally reviewed and noted the following in the patients chart:   Medical and social history Use of alcohol , tobacco or illicit drugs  Current medications and supplements including opioid prescriptions. Functional ability and status Nutritional status Physical activity Advanced directives List of other physicians Hospitalizations,  surgeries, and ER visits in previous 12 months Vitals Screenings to include cognitive, depression, and falls Referrals and appointments  No orders of the defined types were placed in this encounter.  In addition, I have reviewed and discussed with patient certain preventive protocols, quality metrics, and best practice recommendations. A written personalized care plan for preventive services as well as general preventive health recommendations were provided to patient.   Ardella FORBES Dawn, LPN   06/09/7972   Return in 1 year (on 11/12/2025).  After Visit Summary: (MyChart) Due to this being a telephonic visit, the after visit summary with patients personalized plan was offered to patient via MyChart   Nurse Notes: Vaccines not given: covid declined today Will obtain tetanus and shingles at pharmacy HM Addressed: Patient states that she may go to a different practice.  "

## 2025-10-14 ENCOUNTER — Ambulatory Visit: Admitting: Neurology
